# Patient Record
Sex: Female | Born: 1945 | Race: White | Hispanic: No | Marital: Married | Smoking: Former smoker
Health system: Southern US, Community
[De-identification: ages and names within clinical notes are randomized; demographics above are authoritative.]

## PROBLEM LIST (undated history)

## (undated) DIAGNOSIS — T8859XA Other complications of anesthesia, initial encounter: Secondary | ICD-10-CM

## (undated) DIAGNOSIS — C801 Malignant (primary) neoplasm, unspecified: Secondary | ICD-10-CM

## (undated) DIAGNOSIS — G709 Myoneural disorder, unspecified: Secondary | ICD-10-CM

## (undated) DIAGNOSIS — T4145XA Adverse effect of unspecified anesthetic, initial encounter: Secondary | ICD-10-CM

## (undated) DIAGNOSIS — H269 Unspecified cataract: Secondary | ICD-10-CM

## (undated) DIAGNOSIS — J45909 Unspecified asthma, uncomplicated: Secondary | ICD-10-CM

## (undated) DIAGNOSIS — R55 Syncope and collapse: Secondary | ICD-10-CM

## (undated) DIAGNOSIS — R42 Dizziness and giddiness: Secondary | ICD-10-CM

## (undated) DIAGNOSIS — S0990XA Unspecified injury of head, initial encounter: Secondary | ICD-10-CM

## (undated) DIAGNOSIS — I639 Cerebral infarction, unspecified: Secondary | ICD-10-CM

## (undated) DIAGNOSIS — IMO0001 Reserved for inherently not codable concepts without codable children: Secondary | ICD-10-CM

## (undated) DIAGNOSIS — M1990 Unspecified osteoarthritis, unspecified site: Secondary | ICD-10-CM

## (undated) DIAGNOSIS — K219 Gastro-esophageal reflux disease without esophagitis: Secondary | ICD-10-CM

## (undated) HISTORY — PX: CATARACT EXTRACTION, BILATERAL: SHX1313

## (undated) HISTORY — DX: Unspecified cataract: H26.9

## (undated) HISTORY — PX: OTHER SURGICAL HISTORY: SHX169

## (undated) HISTORY — PX: COLONOSCOPY W/ POLYPECTOMY: SHX1380

## (undated) HISTORY — PX: KNEE ARTHROSCOPY W/ MENISCAL REPAIR: SHX1877

---

## 1970-02-25 HISTORY — PX: TUBAL LIGATION: SHX77

## 1976-02-26 HISTORY — PX: BREAST ENHANCEMENT SURGERY: SHX7

## 1976-02-26 HISTORY — PX: VARICOSE VEIN SURGERY: SHX832

## 1991-02-26 HISTORY — PX: ABDOMINAL HYSTERECTOMY: SHX81

## 2001-08-24 ENCOUNTER — Ambulatory Visit (HOSPITAL_BASED_OUTPATIENT_CLINIC_OR_DEPARTMENT_OTHER): Admission: RE | Admit: 2001-08-24 | Discharge: 2001-08-24 | Payer: Self-pay | Admitting: Orthopedic Surgery

## 2004-01-13 ENCOUNTER — Ambulatory Visit (HOSPITAL_COMMUNITY): Admission: RE | Admit: 2004-01-13 | Discharge: 2004-01-13 | Payer: Self-pay | Admitting: Gastroenterology

## 2004-01-13 ENCOUNTER — Encounter (INDEPENDENT_AMBULATORY_CARE_PROVIDER_SITE_OTHER): Payer: Self-pay | Admitting: *Deleted

## 2004-07-02 ENCOUNTER — Emergency Department (HOSPITAL_COMMUNITY): Admission: EM | Admit: 2004-07-02 | Discharge: 2004-07-02 | Payer: Self-pay | Admitting: Emergency Medicine

## 2007-05-17 ENCOUNTER — Emergency Department (HOSPITAL_COMMUNITY): Admission: EM | Admit: 2007-05-17 | Discharge: 2007-05-17 | Payer: Self-pay | Admitting: Emergency Medicine

## 2007-10-19 ENCOUNTER — Encounter: Payer: Self-pay | Admitting: Family Medicine

## 2008-07-27 ENCOUNTER — Ambulatory Visit: Payer: Self-pay | Admitting: Family Medicine

## 2008-07-27 DIAGNOSIS — L255 Unspecified contact dermatitis due to plants, except food: Secondary | ICD-10-CM | POA: Insufficient documentation

## 2008-07-27 DIAGNOSIS — K219 Gastro-esophageal reflux disease without esophagitis: Secondary | ICD-10-CM | POA: Insufficient documentation

## 2008-07-27 DIAGNOSIS — F329 Major depressive disorder, single episode, unspecified: Secondary | ICD-10-CM

## 2008-07-27 DIAGNOSIS — F3289 Other specified depressive episodes: Secondary | ICD-10-CM | POA: Insufficient documentation

## 2008-10-24 ENCOUNTER — Encounter: Payer: Self-pay | Admitting: Family Medicine

## 2008-11-03 ENCOUNTER — Ambulatory Visit: Payer: Self-pay | Admitting: Family Medicine

## 2008-11-03 LAB — CONVERTED CEMR LAB
AST: 32 units/L (ref 0–37)
Albumin: 3.8 g/dL (ref 3.5–5.2)
Alkaline Phosphatase: 63 units/L (ref 39–117)
Basophils Relative: 0.5 % (ref 0.0–3.0)
Bilirubin, Direct: 0 mg/dL (ref 0.0–0.3)
Blood in Urine, dipstick: NEGATIVE
CO2: 30 meq/L (ref 19–32)
Calcium: 9.2 mg/dL (ref 8.4–10.5)
Creatinine, Ser: 0.9 mg/dL (ref 0.4–1.2)
Eosinophils Absolute: 0.1 10*3/uL (ref 0.0–0.7)
Eosinophils Relative: 1.9 % (ref 0.0–5.0)
GFR calc non Af Amer: 67.09 mL/min (ref >60)
HDL: 65.7 mg/dL (ref >39.00)
Hemoglobin: 14 g/dL (ref 12.0–15.0)
Ketones, urine, test strip: NEGATIVE
LDL Cholesterol: 110 mg/dL — ABNORMAL HIGH (ref 0–99)
Lymphocytes Relative: 30.6 % (ref 12.0–46.0)
MCHC: 33.6 g/dL (ref 30.0–36.0)
Monocytes Relative: 12.3 % — ABNORMAL HIGH (ref 3.0–12.0)
Neutro Abs: 2.3 10*3/uL (ref 1.4–7.7)
Neutrophils Relative %: 54.7 % (ref 43.0–77.0)
Nitrite: NEGATIVE
RBC: 4.57 M/uL (ref 3.87–5.11)
Sodium: 138 meq/L (ref 135–145)
Specific Gravity, Urine: 1.015
Total CHOL/HDL Ratio: 3
Total Protein: 6.6 g/dL (ref 6.0–8.3)
Triglycerides: 54 mg/dL (ref 0.0–149.0)
Urobilinogen, UA: 0.2
WBC Urine, dipstick: NEGATIVE
WBC: 4.2 10*3/uL — ABNORMAL LOW (ref 4.5–10.5)

## 2008-11-11 ENCOUNTER — Ambulatory Visit: Payer: Self-pay | Admitting: Family Medicine

## 2008-11-11 DIAGNOSIS — L719 Rosacea, unspecified: Secondary | ICD-10-CM | POA: Insufficient documentation

## 2008-11-11 DIAGNOSIS — R3915 Urgency of urination: Secondary | ICD-10-CM | POA: Insufficient documentation

## 2008-11-11 DIAGNOSIS — Z780 Asymptomatic menopausal state: Secondary | ICD-10-CM | POA: Insufficient documentation

## 2008-12-06 ENCOUNTER — Ambulatory Visit: Payer: Self-pay | Admitting: Internal Medicine

## 2008-12-06 ENCOUNTER — Encounter: Payer: Self-pay | Admitting: Family Medicine

## 2008-12-15 ENCOUNTER — Telehealth: Payer: Self-pay | Admitting: Family Medicine

## 2009-07-17 ENCOUNTER — Ambulatory Visit: Payer: Self-pay | Admitting: Family Medicine

## 2009-07-17 DIAGNOSIS — R252 Cramp and spasm: Secondary | ICD-10-CM | POA: Insufficient documentation

## 2009-10-09 ENCOUNTER — Ambulatory Visit: Payer: Self-pay | Admitting: Family Medicine

## 2009-10-09 DIAGNOSIS — R319 Hematuria, unspecified: Secondary | ICD-10-CM | POA: Insufficient documentation

## 2009-10-09 DIAGNOSIS — R609 Edema, unspecified: Secondary | ICD-10-CM | POA: Insufficient documentation

## 2009-10-09 LAB — CONVERTED CEMR LAB
Ketones, urine, test strip: NEGATIVE
Protein, U semiquant: NEGATIVE
Specific Gravity, Urine: 1.01

## 2009-10-10 ENCOUNTER — Encounter: Payer: Self-pay | Admitting: Family Medicine

## 2009-10-12 LAB — CONVERTED CEMR LAB
BUN: 16 mg/dL (ref 6–23)
CO2: 29 meq/L (ref 19–32)
Calcium: 9.4 mg/dL (ref 8.4–10.5)
Glucose, Bld: 86 mg/dL (ref 70–99)
Potassium: 5.2 meq/L — ABNORMAL HIGH (ref 3.5–5.1)
Sodium: 142 meq/L (ref 135–145)

## 2009-10-23 ENCOUNTER — Ambulatory Visit: Payer: Self-pay | Admitting: Family Medicine

## 2009-10-23 LAB — CONVERTED CEMR LAB
Bilirubin Urine: NEGATIVE
Blood in Urine, dipstick: NEGATIVE
Glucose, Urine, Semiquant: NEGATIVE
Protein, U semiquant: NEGATIVE
Urobilinogen, UA: 0.2
pH: 6

## 2009-11-07 ENCOUNTER — Ambulatory Visit: Payer: Self-pay | Admitting: Family Medicine

## 2009-11-07 DIAGNOSIS — R21 Rash and other nonspecific skin eruption: Secondary | ICD-10-CM | POA: Insufficient documentation

## 2009-11-07 LAB — CONVERTED CEMR LAB
ALT: 22 U/L
AST: 32 U/L
Albumin: 3.7 g/dL
Alkaline Phosphatase: 63 U/L
BUN: 18 mg/dL
Basophils Absolute: 0 K/uL
Basophils Relative: 1.1 %
Bilirubin Urine: NEGATIVE
Bilirubin, Direct: 0.2 mg/dL
CO2: 29 meq/L
Calcium: 8.9 mg/dL
Chloride: 104 meq/L
Cholesterol: 203 mg/dL — ABNORMAL HIGH
Creatinine, Ser: 0.8 mg/dL
Direct LDL: 133.6 mg/dL
Eosinophils Absolute: 0.1 K/uL
Eosinophils Relative: 2.6 %
GFR calc non Af Amer: 72.42 mL/min
Glucose, Bld: 78 mg/dL
HCT: 39.2 %
HDL: 61.7 mg/dL
Hemoglobin, Urine: NEGATIVE
Hemoglobin: 13.5 g/dL
Ketones, ur: NEGATIVE mg/dL
Lymphocytes Relative: 35 %
Lymphs Abs: 1.4 K/uL
MCHC: 34.5 g/dL
MCV: 88.6 fL
Monocytes Absolute: 0.5 K/uL
Monocytes Relative: 12.1 % — ABNORMAL HIGH
Neutro Abs: 2 K/uL
Neutrophils Relative %: 49.2 %
Nitrite: NEGATIVE
Platelets: 281 K/uL
Potassium: 4.6 meq/L
RBC: 4.42 M/uL
RDW: 13.7 %
Sodium: 140 meq/L
Specific Gravity, Urine: <=1.005
TSH: 1.82 u[IU]/mL
Total Bilirubin: 0.7 mg/dL
Total CHOL/HDL Ratio: 3
Total Protein, Urine: NEGATIVE mg/dL
Total Protein: 6.1 g/dL
Triglycerides: 37 mg/dL
Urine Glucose: NEGATIVE mg/dL
Urobilinogen, UA: 0.2
VLDL: 7.4 mg/dL
WBC: 4.1 10*3/microliter — ABNORMAL LOW
pH: 6

## 2009-11-14 ENCOUNTER — Ambulatory Visit: Payer: Self-pay | Admitting: Family Medicine

## 2009-11-15 ENCOUNTER — Telehealth: Payer: Self-pay | Admitting: Family Medicine

## 2009-11-21 ENCOUNTER — Encounter: Payer: Self-pay | Admitting: Family Medicine

## 2010-03-14 ENCOUNTER — Encounter: Payer: Self-pay | Admitting: Family Medicine

## 2010-03-17 ENCOUNTER — Encounter: Payer: Self-pay | Admitting: Obstetrics and Gynecology

## 2010-03-19 ENCOUNTER — Encounter: Payer: Self-pay | Admitting: Family Medicine

## 2010-03-27 NOTE — Assessment & Plan Note (Signed)
Summary: poison ivy//ccm   Vital Signs:  Patient profile:   65 year old female Menstrual status:  hysterectomy Temp:     98.6 degrees F oral BP sitting:   90 / 64  (left arm) Cuff size:   regular  Vitals Entered By: Sid Falcon LPN (Jul 17, 2009 3:56 PM) CC: Poison ivy   History of Present Illness: Patient seen with recurrent pruritic rash typical of contact dermatitis. Multiple similar rashes in the past.  Current rash left neck and lower extremities. Denies any fever or chills. Has tried over-the-counter medications without much relief. Has responded well to combination of intramuscular steroids and oral prednisone in the past. Symptoms of pruritus are severe at times.  Patient also relates that she's had diazepam 5 mg in the past for sporadic leg cramps and this seems to help. She rarely takes his medication.  Allergies (verified): 1)  * Attarax  Past History:  Past Medical History: Last updated: 11/11/2008 Arthritis in foot and knee Chicken pox Depression GERD Hay fever/allergies Urin incontinence occasionally Rosacea  Review of Systems      See HPI  Physical Exam  General:  Well-developed,well-nourished,in no acute distress; alert,appropriate and cooperative throughout examination Mouth:  Oral mucosa and oropharynx without lesions or exudates.  Teeth in good repair. Lungs:  Normal respiratory effort, chest expands symmetrically. Lungs are clear to auscultation, no crackles or wheezes. Heart:  Normal rate and regular rhythm. S1 and S2 normal without gallop, murmur, click, rub or other extra sounds. Skin:  patient has slightly raised vesicular rash which is nontender lower legs bilaterally and small patch left neck   Impression & Recommendations:  Problem # 1:  RHUS DERMATITIS (ICD-692.6)  Her updated medication list for this problem includes:    Prednisone 10 Mg Tabs (Prednisone) .Marland Kitchen... Taper as follows: 4-4-4-4-3-3-3-2-2-2-1-1  Problem # 2:  LEG CRAMPS  (ICD-729.82)  Complete Medication List: 1)  Wellbutrin Xl 300 Mg Xr24h-tab (Bupropion hcl) .... One by mouth once daily 2)  Prilosec Otc 20 Mg Tbec (Omeprazole magnesium) .... Once daily 3)  Vesicare 5 Mg Tabs (Solifenacin succinate) .... One by mouth once daily 4)  Metrogel 1 % Gel (Metronidazole) .... Apply to affected rash once daily 5)  Prednisone 10 Mg Tabs (Prednisone) .... Taper as follows: 4-4-4-4-3-3-3-2-2-2-1-1 6)  Diazepam 5 Mg Tabs (Diazepam) .... One by mouth two times a day as needed.  Patient Instructions: 1)  follow up promptly for any signs of secondary infection including increased redness, fever, or painful rash Prescriptions: DIAZEPAM 5 MG TABS (DIAZEPAM) one by mouth two times a day as needed.  #30 x 0   Entered and Authorized by:   Evelena Peat MD   Signed by:   Evelena Peat MD on 07/17/2009   Method used:   Print then Give to Patient   RxID:   [id number] PREDNISONE 10 MG TABS (PREDNISONE) taper as follows: 4-4-4-4-3-3-3-2-2-2-1-1  #33 x 0   Entered and Authorized by:   Evelena Peat MD   Signed by:   Evelena Peat MD on 07/17/2009   Method used:   [facility]ron[facility] to        [facility] [phone]* (retail)       [address]       [place], [place]  [address]       Ph: [phone] or [phone]       Fax: [phone]   RxID:   [id number]

## 2010-03-27 NOTE — Assessment & Plan Note (Signed)
Summary: CPX/CJR   Vital Signs:  Patient profile:   65 year old female Menstrual status:  hysterectomy Height:      68 inches Weight:      164 pounds BMI:     25.03 O2 Sat:      97 % Temp:     09.2 degrees F oral Pulse rate:   87 / minute Pulse rhythm:   regular Resp:     16 per minute BP sitting:   100 / 76  Vitals Entered By: Lynann Beaver CMA (November 14, 2009 10:28 AM)  Nutrition Counseling: Patient's BMI is greater than 25 and therefore counseled on weight management options. CC: cpx Is Patient Diabetic? No Pain Assessment Patient in pain? no        History of Present Illness: Here for CPE.  Needs flu vaccine. Exercising regularly Other immunizatins up to date with exception of Shingles vaccine and she is unsure if insurance will cover. Colonoscopy up to date.  No indication for pap as prior TAH.  Clinical Review Panels:  Prevention   Last Mammogram:  Normal (11/06/2009)   Last Colonoscopy:  normal (02/26/2003)  Immunizations   Last Tetanus Booster:  Historical (02/26/2004)   Last Pneumovax:  Historical (02/26/2007)  Lipid Management   Cholesterol:  203 (11/07/2009)   LDL (bad choesterol):  110 (11/03/2008)   HDL (good cholesterol):  61.70 (11/07/2009)  Diabetes Management   Creatinine:  0.8 (11/07/2009)   Last Pneumovax:  Historical (02/26/2007)  CBC   WBC:  4.1 (11/07/2009)   RBC:  4.42 (11/07/2009)   Hgb:  13.5 (11/07/2009)   Hct:  39.2 (11/07/2009)   Platelets:  281.0 (11/07/2009)   MCV  88.6 (11/07/2009)   MCHC  34.5 (11/07/2009)   RDW  13.7 (11/07/2009)   PMN:  49.2 (11/07/2009)   Lymphs:  35.0 (11/07/2009)   Monos:  12.1 (11/07/2009)   Eosinophils:  2.6 (11/07/2009)   Basophil:  1.1 (11/07/2009)  Complete Metabolic Panel   Glucose:  78 (11/07/2009)   Sodium:  140 (11/07/2009)   Potassium:  4.6 (11/07/2009)   Chloride:  104 (11/07/2009)   CO2:  29 (11/07/2009)   BUN:  18 (11/07/2009)   Creatinine:  0.8 (11/07/2009)  Albumin:  3.7 (11/07/2009)   Total Protein:  6.1 (11/07/2009)   Calcium:  8.9 (11/07/2009)   Total Bili:  0.7 (11/07/2009)   Alk Phos:  63 (11/07/2009)   SGPT (ALT):  22 (11/07/2009)   SGOT (AST):  32 (11/07/2009)   Current Medications (verified): 1)  Wellbutrin Xl 300 Mg Xr24h-Tab (Bupropion Hcl) .... One By Mouth Once Daily 2)  Prilosec Otc 20 Mg Tbec (Omeprazole Magnesium) .... Once Daily 3)  Vesicare 5 Mg Tabs (Solifenacin Succinate) .... One By Mouth Once Daily 4)  Metrogel 1 % Gel (Metronidazole) .... Apply To Affected Rash Once Daily 5)  Diazepam 5 Mg Tabs (Diazepam) .... One By Mouth Two Times A Day As Needed. 6)  Macucure .Marland Kitchen.. 2 Tabs Daily (Per Dr Elmer Picker For Eyes) 7)  Triamincilone 0.1% Cream (454gm) 1:1 Compound With Eucerin .... Use As Directed  Allergies (verified): 1)  * Attarax  Past History:  Past Medical History: Last updated: 11/11/2008 Arthritis in foot and knee Chicken pox Depression GERD Hay fever/allergies Urin incontinence occasionally Rosacea  Family History: Last updated: 11/14/2009 Family history heart disease  Mother 68 MI Family history kidney disease  Social History: Last updated: 07/27/2008 Retired Professor (PHD) Married Smoker, quit 1992  Past Surgical History: Tubal ligation 1973 vein  stripping 1978 Breast augmentation 1983 Laser surgery on veins left leg 2009 ACL right knee 2003 Hysterectomy, TAH 1994  menorrhagia  Family History: Family history heart disease  Mother 52 MI Family history kidney disease  Review of Systems  The patient denies anorexia, fever, weight loss, weight gain, vision loss, decreased hearing, hoarseness, chest pain, syncope, dyspnea on exertion, peripheral edema, prolonged cough, headaches, hemoptysis, abdominal pain, melena, hematochezia, severe indigestion/heartburn, hematuria, incontinence, genital sores, muscle weakness, suspicious skin lesions, transient blindness, difficulty walking, depression,  unusual weight change, abnormal bleeding, enlarged lymph nodes, and breast masses.    Physical Exam  General:  Well-developed,well-nourished,in no acute distress; alert,appropriate and cooperative throughout examination Head:  Normocephalic and atraumatic without obvious abnormalities. No apparent alopecia or balding. Eyes:  pupils equal, pupils round, and pupils reactive to light.   Ears:  External ear exam shows no significant lesions or deformities.  Otoscopic examination reveals clear canals, tympanic membranes are intact bilaterally without bulging, retraction, inflammation or discharge. Hearing is grossly normal bilaterally. Mouth:  Oral mucosa and oropharynx without lesions or exudates.  Teeth in good repair. Neck:  No deformities, masses, or tenderness noted. Lungs:  Normal respiratory effort, chest expands symmetrically. Lungs are clear to auscultation, no crackles or wheezes. Heart:  Normal rate and regular rhythm. S1 and S2 normal without gallop, murmur, click, rub or other extra sounds. Abdomen:  Bowel sounds positive,abdomen soft and non-tender without masses, organomegaly or hernias noted. Genitalia:  s/p TAH Msk:  No deformity or scoliosis noted of thoracic or lumbar spine.   Extremities:  No clubbing, cyanosis, edema, or deformity noted with normal full range of motion of all joints.   Neurologic:  alert & oriented X3, cranial nerves II-XII intact, strength normal in all extremities, and gait normal.   Skin:  Intact without suspicious lesions or rashes Cervical Nodes:  No lymphadenopathy noted Psych:  normally interactive, good eye contact, not anxious appearing, and not depressed appearing.     Impression & Recommendations:  Problem # 1:  ROUTINE GENERAL MEDICAL EXAM@HEALTH  CARE FACL (ICD-V70.0) cont regular exercise.  hemoccults given.  Will check on Zostavax coverage. DEXA last year normal range.  Complete Medication List: 1)  Wellbutrin Xl 300 Mg Xr24h-tab (Bupropion  hcl) .... One by mouth once daily 2)  Prilosec Otc 20 Mg Tbec (Omeprazole magnesium) .... Once daily 3)  Vesicare 5 Mg Tabs (Solifenacin succinate) .... One by mouth once daily 4)  Metrogel 1 % Gel (Metronidazole) .... Apply to affected rash once daily 5)  Diazepam 5 Mg Tabs (Diazepam) .... One by mouth two times a day as needed. 6)  Macucure  .Marland Kitchen.. 2 tabs daily (per dr Elmer Picker for eyes) 7)  Triamincilone 0.1% Cream (454gm) 1:1 Compound With Eucerin  .... Use as directed  Patient Instructions: 1)  Consider Shingles vaccine 2)  It is important that you exercise reguarly at least 20 minutes 5 times a week. If you develop chest pain, have severe difficulty breathing, or feel very tired, stop exercising immediately and seek medical attention.  3)  Please schedule a follow-up appointment in 1 year.  4)  Take calcium +vitamin D daily.  Prescriptions: VESICARE 5 MG TABS (SOLIFENACIN SUCCINATE) one by mouth once daily  #90 x 3   Entered and Authorized by:   Evelena Peat MD   Signed by:   Evelena Peat MD on 11/14/2009   Method used:   Faxed to ...       [facility] (mail-order)             ,  [place]         Ph: [phone]       Fax: [phone]   RxID:   [id number] WELLBUTRIN XL 300 MG XR24H-TAB (BUPROPION HCL) one by mouth once daily  #90 x 3   Entered and Authorized by:   Evelena Peat MD   Signed by:   Evelena Peat MD on 11/14/2009   Method used:   Faxed to ...       [facility] (mail-order)             , [place]         Ph: [phone]       Fax: [phone]   RxID:   [id number]

## 2010-03-27 NOTE — Assessment & Plan Note (Signed)
Summary: HEMATURIA // RS   Vital Signs:  Patient profile:   65 year old female Menstrual status:  hysterectomy Temp:     97.7 degrees F oral BP sitting:   120 / 84  (left arm) Cuff size:   regular  Vitals Entered By: Sid Falcon LPN (October 09, 2009 8:52 AM) CC: Hematuria, bil swollen ankles   History of Present Illness: Patient seen for the following  Over one week ago when she was on vacation in [place] she noticed some gross hematuria. No urine frequency or burning with urination. Denied fever or chills. No history of kidney stones. She noted blood on 2 occasions only. None since then. No rectal bleeding and no vaginal spotting. She was fairly certain this came from urethra.  Patient relates bilateral ankle edema past week. Possibly some mild sunburn last week preceding this. Her edema is worse late in the day. Does have some associated dyspnea the past several weeks. No orthopnea and no PND. She is not taking any new medications.  Has noted dyspnea particularly with things like going up stairs.  Allergies: 1)  * Attarax  Past History:  Past Medical History: Last updated: 11/11/2008 Arthritis in foot and knee Chicken pox Depression GERD Hay fever/allergies Urin incontinence occasionally Rosacea  Past Surgical History: Last updated: 07/27/2008 Tubal ligation 1973 vein stripping 1978 Breast augmentation 1983 Laser surgery on veins left leg 2009 ACL right knee 2003 Hysterectomy  1994  Family History: Last updated: 07/27/2008 Family history heart disease Family history kidney disease  Social History: Last updated: 07/27/2008 Retired Professor (PHD) Married Smoker, quit 1992 PMH-FH-SH reviewed for relevance  Review of Systems       The patient complains of dyspnea on exertion and peripheral edema.  The patient denies anorexia, fever, weight loss, weight gain, chest pain, syncope, prolonged cough, headaches, hemoptysis, abdominal pain, melena, and muscle  weakness.    Physical Exam  General:  Well-developed,well-nourished,in no acute distress; alert,appropriate and cooperative throughout examination Head:  Normocephalic and atraumatic without obvious abnormalities. No apparent alopecia or balding. Eyes:  No corneal or conjunctival inflammation noted. EOMI. Perrla. Funduscopic exam benign, without hemorrhages, exudates or papilledema. Vision grossly normal. Mouth:  Oral mucosa and oropharynx without lesions or exudates.  Teeth in good repair. Neck:  No deformities, masses, or tenderness noted. Lungs:  Normal respiratory effort, chest expands symmetrically. Lungs are clear to auscultation, no crackles or wheezes. Heart:  Normal rate and regular rhythm. S1 and S2 normal without gallop, murmur, click, rub or other extra sounds. Extremities:  patient has 1+ pitting edema ankles and feet bilaterally. Trace edema lower legs bilaterally Neurologic:  alert & oriented X3, cranial nerves II-XII intact, and gait normal.     Impression & Recommendations:  Problem # 1:  EDEMA (ICD-782.3) Assessment New ?related to recent sun exposure.  Does have some recent dyspnea and will evaluate labs.  No proteinuria.  Consider CXR if persists.  Low dose HCTZ for short term use.. Orders: TLB-BMP (Basic Metabolic Panel-BMET) (80048-METABOL) TLB-TSH (Thyroid Stimulating Hormone) (84443-TSH) TLB-BNP (B-Natriuretic Peptide) (83880-BNPR) Venipuncture ([address]) Specimen Handling ([address])  Her updated medication list for this problem includes:    Hydrochlorothiazide 25 Mg Tabs (Hydrochlorothiazide) ..... One half tablet once daily as needed edema  Problem # 2:  HEMATURIA UNSPECIFIED (ICD-599.70) Assessment: New urine cx and needs f/u UA 2 weeks to reassess and pt is aware.  Only trace blood noted today. Orders: T-Culture, Urine (09811-91478) UA Dipstick w/o Micro (manual) ([address]) Venipuncture (13086) Specimen Handling (  99000)  Complete Medication List: 1)   Wellbutrin Xl 300 Mg Xr24h-tab (Bupropion hcl) .... One by mouth once daily 2)  Prilosec Otc 20 Mg Tbec (Omeprazole magnesium) .... Once daily 3)  Vesicare 5 Mg Tabs (Solifenacin succinate) .... One by mouth once daily 4)  Metrogel 1 % Gel (Metronidazole) .... Apply to affected rash once daily 5)  Prednisone 10 Mg Tabs (Prednisone) .... Taper as follows: 4-4-4-4-3-3-3-2-2-2-1-1 6)  Diazepam 5 Mg Tabs (Diazepam) .... One by mouth two times a day as needed. 7)  Hydrochlorothiazide 25 Mg Tabs (Hydrochlorothiazide) .... One half tablet once daily as needed edema  Patient Instructions: 1)  Limit your Sodium(salt) .  2)  Elevated legs/feet frequently. 3)  Repeat UA in 2 weeks. Prescriptions: HYDROCHLOROTHIAZIDE 25 MG TABS (HYDROCHLOROTHIAZIDE) one half tablet once daily as needed edema  #30 x 0   Entered and Authorized by:   Evelena Peat MD   Signed by:   Evelena Peat MD on 10/09/2009   Method used:   [facility] to        [facility] [phone][facility] (retail)       [address]       [place], [place]  [address]       Ph: [phone] or [phone]       Fax: [phone]   RxID:   [id number]   Laboratory Results   Urine Tests    Routine Urinalysis   Color: yellow Appearance: Clear Glucose: negative   (Normal Range: Negative) Bilirubin: negative   (Normal Range: Negative) Ketone: negative   (Normal Range: Negative) Spec. Gravity: 1.010   (Normal Range: 1.003-1.035) Blood: trace-lysed   (Normal Range: Negative) pH: 6.0   (Normal Range: 5.0-8.0) Protein: negative   (Normal Range: Negative) Urobilinogen: 0.2   (Normal Range: 0-1) Nitrite: negative   (Normal Range: Negative) Leukocyte Esterace: moderate   (Normal Range: Negative)    Comments: Sid Falcon LPN  October 09, 2009 9:01 AM

## 2010-03-27 NOTE — Assessment & Plan Note (Signed)
Summary: 2 week follow up/cjr   Vital Signs:  Patient profile:   65 year old female Menstrual status:  hysterectomy Temp:     97.8 degrees F oral BP sitting:   132 / 88  (left arm) Cuff size:   regular  Vitals Entered By: Sid Falcon LPN (October 23, 2009 11:12 AM)  Serial Vital Signs/Assessments:  Time      Position  BP       Pulse  Resp  Temp     By                     118/80                         Evelena Peat MD    History of Present Illness: Here for follow up recent hematuria. Urine cx was negative. No further episodes.  No dysuria. Has hx urge urine incontinence and Vesicare helps.  Edema improved since last visit.  Labs OK.   Only took one dose of HCTZ.  Allergies: 1)  * Attarax  Past History:  Past Medical History: Last updated: 11/11/2008 Arthritis in foot and knee Chicken pox Depression GERD Hay fever/allergies Urin incontinence occasionally Rosacea  Review of Systems  The patient denies anorexia, fever, weight loss, abdominal pain, and incontinence.    Physical Exam  General:  Well-developed,well-nourished,in no acute distress; alert,appropriate and cooperative throughout examination Eyes:  No corneal or conjunctival inflammation noted. EOMI. Perrla. Funduscopic exam benign, without hemorrhages, exudates or papilledema. Vision grossly normal. Mouth:  Oral mucosa and oropharynx without lesions or exudates.  Teeth in good repair. Neck:  No deformities, masses, or tenderness noted. Lungs:  Normal respiratory effort, chest expands symmetrically. Lungs are clear to auscultation, no crackles or wheezes. Heart:  normal rate and regular rhythm.   Extremities:  no edema today.   Impression & Recommendations:  Problem # 1:  EDEMA (ICD-782.3) Assessment Improved  Her updated medication list for this problem includes:    Hydrochlorothiazide 25 Mg Tabs (Hydrochlorothiazide) ..... One half tablet once daily as needed edema  Problem # 2:  HEMATURIA  UNSPECIFIED (ICD-599.70)  urine dip today normal-no hematuria.  Prompt f/u if recurrent episodes.  Orders: UA Dipstick w/o Micro (manual) ([address])  Complete Medication List: 1)  Wellbutrin Xl 300 Mg Xr24h-tab (Bupropion hcl) .... One by mouth once daily 2)  Prilosec Otc 20 Mg Tbec (Omeprazole magnesium) .... Once daily 3)  Vesicare 5 Mg Tabs (Solifenacin succinate) .... One by mouth once daily 4)  Metrogel 1 % Gel (Metronidazole) .... Apply to affected rash once daily 5)  Prednisone 10 Mg Tabs (Prednisone) .... Taper as follows: 4-4-4-4-3-3-3-2-2-2-1-1 6)  Diazepam 5 Mg Tabs (Diazepam) .... One by mouth two times a day as needed. 7)  Hydrochlorothiazide 25 Mg Tabs (Hydrochlorothiazide) .... One half tablet once daily as needed edema  Laboratory Results   Urine Tests    Routine Urinalysis   Color: lt. yellow Appearance: Clear Glucose: negative   (Normal Range: Negative) Bilirubin: negative   (Normal Range: Negative) Ketone: negative   (Normal Range: Negative) Spec. Gravity: <1.005   (Normal Range: 1.003-1.035) Blood: negative   (Normal Range: Negative) pH: 6.0   (Normal Range: 5.0-8.0) Protein: negative   (Normal Range: Negative) Urobilinogen: 0.2   (Normal Range: 0-1) Nitrite: negative   (Normal Range: Negative) Leukocyte Esterace: negative   (Normal Range: Negative)    Comments: Sid Falcon LPN  October 23, 2009 11:29 AM

## 2010-03-27 NOTE — Progress Notes (Signed)
Summary: Pt to call ins. [id number]   Phone Note Outgoing Call   Call placed by: Sid Falcon LPN,  November 15, 2009 4:49 PM Call placed to: Patient Summary of Call: Per Tim Lair, pt needs to call her insurance co for Zostavax coverage.  Message left on work VM to call her insurance and call for nurse inj visit if she wants the vaccine Initial call taken by: Sid Falcon LPN,  November 15, 2009 4:50 PM

## 2010-03-27 NOTE — Letter (Signed)
Summary: [facility]  [facility]   Imported By: Maryln Gottron 12/19/2009 09:59:44  _____________________________________________________________________  External Attachment:    Type:   Image     Comment:   External Document

## 2010-03-27 NOTE — Assessment & Plan Note (Signed)
Summary: rash all over body//ccm   Vital Signs:  Patient profile:   65 year old female Menstrual status:  hysterectomy Temp:     98.2 degrees F oral BP sitting:   110 / 80  (left arm) Cuff size:   regular  Vitals Entered By: Sid Falcon LPN (November 07, 2009 11:21 AM)  History of Present Illness: Here with a couple of rashes as follows:  L ring finger dry scaly rash for several months.  Used triamcinolone cream for 2-3 weeks without improvement.  Slightly pruritic.  She thought reaction to gold wedding band but did not improve after leaving off.  No other gold reaction (eg to gold in other areas of body).  Several week hx of pruritic rash on trunk, mostly back. Has not tried any topical treatments.  Uses Lever 2000 soap. No new meds.  No fever.  Allergies: 1)  * Attarax  Past History:  Past Medical History: Last updated: 11/11/2008 Arthritis in foot and knee Chicken pox Depression GERD Hay fever/allergies Urin incontinence occasionally Rosacea  Review of Systems      See HPI  Physical Exam  General:  Well-developed,well-nourished,in no acute distress; alert,appropriate and cooperative throughout examination Lungs:  Normal respiratory effort, chest expands symmetrically. Lungs are clear to auscultation, no crackles or wheezes. Heart:  normal rate and regular rhythm.   Skin:  L ring finger near base scaly, dry rash without pustules or vesicles.  Trunk on back she has macular erythematous rash with slightly scaly surface in irreg distribution.    Impression & Recommendations:  Problem # 1:  SKIN RASH (ICD-782.1) Assessment New 1)  ring finger- ddx contact derm vs fungal.  no response to topical steroid.  She will try antifungal-Lamisil. 2) trunk rash-?pityriasis.  Triamcinolone compound with Eucerin 1:1 and reassess at CPE next week.  If pityriasis, she is aware this may last for some time.  Complete Medication List: 1)  Wellbutrin Xl 300 Mg Xr24h-tab  (Bupropion hcl) .... One by mouth once daily 2)  Prilosec Otc 20 Mg Tbec (Omeprazole magnesium) .... Once daily 3)  Vesicare 5 Mg Tabs (Solifenacin succinate) .... One by mouth once daily 4)  Metrogel 1 % Gel (Metronidazole) .... Apply to affected rash once daily 5)  Prednisone 10 Mg Tabs (Prednisone) .... Taper as follows: 4-4-4-4-3-3-3-2-2-2-1-1 6)  Diazepam 5 Mg Tabs (Diazepam) .... One by mouth two times a day as needed. 7)  Hydrochlorothiazide 25 Mg Tabs (Hydrochlorothiazide) .... One half tablet once daily as needed edema 8)  Macucure  .Marland Kitchen.. 2 tabs daily (per dr Elmer Picker for eyes) 9)  Triamincilone 0.1% Cream (454gm) 1:1 Compound With Eucerin  .... Use as directed  Patient Instructions: 1)  Use medicated cream twice daily. 2)  Be in touch if symptoms persist or worsen. Prescriptions: TRIAMINCILONE 0.1% CREAM (454GM) 1:1 COMPOUND WITH EUCERIN use as directed  #1 x 0   Entered by:   Sid Falcon LPN   Authorized by:   Evelena Peat MD   Signed by:   Sid Falcon LPN on 04/54/0981   Method used:   Telephoned to ...       [facility] [address] (retail)       [address]       [place], [place]  [address]       Ph: [phone] or [phone]       Fax: [phone]   RxID:   [id number]

## 2010-04-04 ENCOUNTER — Other Ambulatory Visit: Payer: Self-pay | Admitting: Dermatology

## 2010-04-04 NOTE — Consult Note (Signed)
Summary: [facility]  [facility]   Imported By: Maryln Gottron 03/30/2010 13:40:26  _____________________________________________________________________  External Attachment:    Type:   Image     Comment:   External Document

## 2010-04-23 ENCOUNTER — Other Ambulatory Visit: Payer: Self-pay | Admitting: Dermatology

## 2010-07-12 ENCOUNTER — Other Ambulatory Visit: Payer: Self-pay | Admitting: Dermatology

## 2010-07-13 NOTE — Op Note (Signed)
[name], Mia Peterson               ACCOUNT NO.:  [id number]   MEDICAL RECORD NO.:  [id number]          PATIENT TYPE:  AMB   LOCATION:  ENDO                         FACILITY:  [facility]   PHYSICIAN:  Anselmo Rod, M.D.  DATE OF BIRTH:  14-Nov-1945   DATE OF PROCEDURE:  01/16/2004  DATE OF DISCHARGE:  01/13/2004                                 OPERATIVE REPORT   PROCEDURE PERFORMED:  Colonoscopy with cold biopsies times three.   ENDOSCOPIST:  Charna Elizabeth, M.D.   INSTRUMENT USED:  Olympus video colonoscope.   INDICATIONS FOR PROCEDURE:  The patient is a 65 year old white female  undergoing screening colonoscopy.  The patient had an episode of rectal  bleeding on one occasion in the recent past.  Rule out colonic polyps,  masses, hemorrhoids, etc.   PREPROCEDURE PREPARATION:  Informed consent was procured from the patient.  The patient was fasted for eight hours prior to the procedure and prepped  with a bottle of magnesium citrate and a gallon of GoLYTELY the night prior  to the procedure.  10% miss rates of polyps, cancers were discussed with the  patient.  The risks and benefits of the procedure including bleeding,  perforation, etc were discussed with her as well.   PREPROCEDURE PHYSICAL:  The patient had stable vital signs.  Neck supple.  Chest clear to auscultation.  S1 and S2 regular.  Abdomen soft with normal  bowel sounds.   DESCRIPTION OF PROCEDURE:  The patient was placed in left lateral decubitus  position and sedated with 100 mg of Demerol and 7 mg of Versed in slow  incremental doses.  Once the patient was adequately sedated and maintained  on low flow oxygen and continuous cardiac monitoring, the Olympus video  colonoscope was advanced from the rectum to the cecum.  The appendicular  orifice and ileocecal valve were clearly visualized and photographed.  Small  internal hemorrhoids were seen on retroflexion in the rectum and two small  sessile polyps were biopsied at 60  cm.  There was no evidence of  diverticulosis.  The patient tolerated the procedure well without immediate  complications.   IMPRESSION:  1.  Small nonbleeding internal hemorrhoids.  2.  Two small sessile polyps biopsied (cold biopsies times three) at 60 cm.  3.  No evidence of diverticulosis.   RECOMMENDATIONS:  1.  Await pathology results.  2.  Avoid all nonsteroidals including aspirin for the next two weeks.  3.  Outpatient followup as need arises in the future.  4.  Repeat colorectal cancer screening depending on pathology results.  5.  Anusol suppositories have [facility] for symptomatic relief from her hemorrhoids.       JNM/MEDQ  D:  01/16/2004  T:  01/16/2004  Job:  [id number]   cc:   Evelena Peat, M.D.  [address]  [place]  [place] [address]  Fax: [phone]

## 2010-07-13 NOTE — Op Note (Signed)
[facility]  Patient:    Mia Peterson, Mia Peterson Visit Number: [id number] MRN: [id number]          Service Type: DSU Location: [facility] Attending Physician:  Milly Jakob Dictated by:   Harvie Junior, M.D. Proc. Date: 08/24/01 Admit Date:  08/24/2001                             Operative Report  PREOPERATIVE DIAGNOSIS:  Lateral meniscal tear with degeneration lateral femoral compartment.  POSTOPERATIVE DIAGNOSES: 1. Lateral meniscal tear with degeneration lateral femoral compartment. 2. 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  PROCEDURES PERFORMED: 1. Debridement of posterior horn medial meniscectomy with corresponding    chondroplasty of the lateral tibial plateau and lateral femoral condyle. 2. Chondroplasty medial femoral condyle. 3. Medial shelf plica resection.  SURGEON:  Harvie Junior, M.D.  ASSISTANT SURGEONS:  Currie Paris. Thedore Mins. and Floyde Parkins, P.A. student.  ANESTHESIA:  Knee block with MAC.  BRIEF HISTORY:  The patient is a 65 year old female with a long history of having catching and locking in the knee on the lateral side in particular. Ultimately she was evaluated and felt to have a meniscal tear. She tried conservative treatment and failed. 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 Because of the failure of conservative care, the patient was ultimately taken to the operating room for operative knee arthroscopy.  DESCRIPTION OF THE PROCEDURE:  The patient was taken to the operating room and after adequate anesthesia with a MAC and a knee block place, the patient was placed in the supine position and the table was turned to the right. The leg was prepped and draped in the usual sterile fashion.  Following this routine arthroscopic examination of the knee revealed there was an obvious  posterior tear of the lateral meniscus, as was determined by MRI. This was debrided with the straight running forceps back to a smooth and stable rim. There was corresponding grade 3 changes in the lateral tibial plateau on her femoral condyle. These were debrided with the sucker shaver back to a smooth and stable rim.  Attention was turned to the medial femoral compartment where there was noted to be some grade 2 changes in the medial femoral condyle. This was debrided with a chondroplasty. Attention at that time was turned to the patellofemoral joint where there was noted to be some relatively midline tracking and no significant chondromalacia. There was a medial shelf plica which was resected.  At this point the knee was copiously irrigated and suctioned dry. A final check was made for any loose fragment pieces prior to that and there were none. A sterile compressive dressing was applied and the patient was taken to the recovery room and noted to be in satisfactory condition. Dictated by:   Harvie Junior, M.D. Attending Physician:  Milly Jakob DD:  08/24/01 TD:  08/25/01 Job: [id number]

## 2010-08-16 ENCOUNTER — Other Ambulatory Visit: Payer: Self-pay | Admitting: Dermatology

## 2010-09-07 ENCOUNTER — Other Ambulatory Visit: Payer: Self-pay | Admitting: Dermatology

## 2010-10-09 ENCOUNTER — Encounter: Payer: Self-pay | Admitting: Family Medicine

## 2010-11-12 ENCOUNTER — Other Ambulatory Visit: Payer: Self-pay | Admitting: Family Medicine

## 2010-11-19 LAB — URINALYSIS, ROUTINE W REFLEX MICROSCOPIC
Bilirubin Urine: NEGATIVE
Glucose, UA: NEGATIVE
Hgb urine dipstick: NEGATIVE
Specific Gravity, Urine: 1.002 — ABNORMAL LOW
pH: 6.5

## 2010-11-19 LAB — COMPREHENSIVE METABOLIC PANEL
ALT: 25
CO2: 25
Calcium: 7.8 — ABNORMAL LOW
Creatinine, Ser: 0.89
GFR calc non Af Amer: >60
Glucose, Bld: 96
Sodium: 136

## 2010-11-19 LAB — CBC
HCT: 40.2
Hemoglobin: 13.8
MCHC: 34.4
MCV: 85.7
RBC: 4.69

## 2010-11-19 LAB — DIFFERENTIAL
Eosinophils Absolute: 0
Eosinophils Relative: 0
Monocytes Absolute: 0.3
Neutrophils Relative %: 63

## 2010-11-23 ENCOUNTER — Encounter: Payer: Self-pay | Admitting: Family Medicine

## 2010-11-23 ENCOUNTER — Ambulatory Visit (INDEPENDENT_AMBULATORY_CARE_PROVIDER_SITE_OTHER): Payer: Medicare Other | Admitting: Family Medicine

## 2010-11-23 VITALS — BP 120/90 | HR 72 | Temp 97.6°F | Resp 12 | Ht 68.0 in | Wt 164.0 lb

## 2010-11-23 DIAGNOSIS — M25512 Pain in left shoulder: Secondary | ICD-10-CM

## 2010-11-23 DIAGNOSIS — Z0000 Encounter for general adult medical examination without abnormal findings: Secondary | ICD-10-CM

## 2010-11-23 DIAGNOSIS — Z23 Encounter for immunization: Secondary | ICD-10-CM

## 2010-11-23 DIAGNOSIS — H6120 Impacted cerumen, unspecified ear: Secondary | ICD-10-CM

## 2010-11-23 DIAGNOSIS — C4492 Squamous cell carcinoma of skin, unspecified: Secondary | ICD-10-CM | POA: Insufficient documentation

## 2010-11-23 DIAGNOSIS — R3915 Urgency of urination: Secondary | ICD-10-CM

## 2010-11-23 DIAGNOSIS — M25519 Pain in unspecified shoulder: Secondary | ICD-10-CM

## 2010-11-23 DIAGNOSIS — C4491 Basal cell carcinoma of skin, unspecified: Secondary | ICD-10-CM | POA: Insufficient documentation

## 2010-11-23 LAB — BASIC METABOLIC PANEL
CO2: 28 mEq/L (ref 19–32)
Calcium: 9.3 mg/dL (ref 8.4–10.5)
GFR: 71.2 mL/min (ref >60.00)
Sodium: 140 mEq/L (ref 135–145)

## 2010-11-23 LAB — CBC WITH DIFFERENTIAL/PLATELET
Basophils Absolute: 0 10*3/uL (ref 0.0–0.1)
Eosinophils Absolute: 0.2 10*3/uL (ref 0.0–0.7)
HCT: 40 % (ref 36.0–46.0)
Lymphs Abs: 1.7 10*3/uL (ref 0.7–4.0)
MCHC: 33.1 g/dL (ref 30.0–36.0)
MCV: 91.2 fl (ref 78.0–100.0)
Monocytes Absolute: 0.7 10*3/uL (ref 0.1–1.0)
Neutrophils Relative %: 65.4 % (ref 43.0–77.0)
Platelets: 307 10*3/uL (ref 150.0–400.0)
RDW: 14.1 % (ref 11.5–14.6)
WBC: 7.3 10*3/uL (ref 4.5–10.5)

## 2010-11-23 LAB — LIPID PANEL
Cholesterol: 218 mg/dL — ABNORMAL HIGH (ref 0–200)
HDL: 77.8 mg/dL (ref >39.00)
Triglycerides: 28 mg/dL (ref 0.0–149.0)

## 2010-11-23 LAB — HEPATIC FUNCTION PANEL
Albumin: 4 g/dL (ref 3.5–5.2)
Total Bilirubin: 0.6 mg/dL (ref 0.3–1.2)

## 2010-11-23 LAB — TSH: TSH: 1.69 u[IU]/mL (ref 0.35–5.50)

## 2010-11-23 NOTE — Progress Notes (Signed)
Subjective:    [name], female    DOB: Sep 28, 1945, 65 y.o.   MRN: [id number]  HPI Patient here for Medicare wellness exam and medical followup. She has seen dermatologist for the past year for excision of both basal cell and squamous cell skin cancers. Also had actinic keratosis involving back. Tetanus 2006. Pneumovax 2009. No history of shingles vaccine. DEXA scan 2 years ago. Mammogram earlier this month. Colonoscopy 2005.  Right ear fullness. History of cerumen impactions previously. Right shoulder pain intermittently for several months. No injury. Pain worse with internal rotation and abduction. No weakness. Recently night pain. No alleviating factors. Ices intermittently. No neck pain  Some urine urgency mostly at night. Vesicare helps overall with symptoms but she is taking in the morning.  No adverse side effects.  No past medical history on file. Past Surgical History  Procedure Date  . Breast enhancement surgery 1978  . Tubal ligation 1972  . Varicose vein surgery 1978  . Abdominal hysterectomy 1993    menorrhagia.    reports that she quit smoking about 20 years ago. Her smoking use included Cigarettes. She has a 20 pack-year smoking history. She does not have any smokeless tobacco history on file. Her alcohol and drug histories not on file. family history includes Cancer (age of onset:90) in her father and Heart disease (age of onset:63) in her mother. No Known Allergies  1.  Risk factors based on Past Medical , Social, and Family history  history as above and reviewed. 2.  Limitations in physical activities very active physically. Low risk for falls 3.  Depression/mood no depression or anxiety issues 4.  Hearing she's had some chronic hearing loss. Recently has seen audiologist. Prescription for hearing aids but not using 5.  ADLs fully independent in all 6.  Cognitive function (orientation to time and place, language, writing, speech,memory) no limitations  with short or long-term memory. Judgment intact. Language intact 7.  Home Safety no issues identified 8.  Height, weight, and visual acuity. Height and weight stable. No visual changes 9.  Counseling 10. Recommendation of preventive services. Flu vaccine given. Recommend shingles vaccine. Continue yearly mammogram. Hemoccult cards and repeat colonoscopy in a couple years. Repeat DEXA scan next year with mammogram 11. Labs based on risk factors TSH, lipid, hepatic, CBC, and basic metabolic panel 12. Care Plan as above 13.  Advance Directives-discussed.     Review of Systems  Constitutional: Negative for fever, activity change, appetite change, fatigue and unexpected weight change.  HENT: Negative for hearing loss, ear pain, sore throat and trouble swallowing.   Eyes: Negative for visual disturbance.  Respiratory: Negative for cough and shortness of breath.   Cardiovascular: Negative for chest pain and palpitations.  Gastrointestinal: Negative for abdominal pain, diarrhea, constipation and blood in stool.  Genitourinary: Negative for dysuria and hematuria.  Musculoskeletal: Negative for myalgias, back pain and arthralgias.  Skin: Negative for rash.  Neurological: Negative for dizziness, syncope and headaches.  Hematological: Negative for adenopathy.  Psychiatric/Behavioral: Negative for confusion and dysphoric mood.       Objective:   Physical Exam  Constitutional: She is oriented to person, place, and time. She appears well-developed and well-nourished.  HENT:  Head: Normocephalic and atraumatic.       Cerumen impaction right canal  Eyes: EOM are normal. Pupils are equal, round, and reactive to light.  Neck: Normal range of motion. Neck supple. No thyromegaly present.  Cardiovascular: Normal rate, regular rhythm and normal heart  sounds.   No murmur heard. Pulmonary/Chest: Breath sounds normal. No respiratory distress. She has no wheezes. She has no rales.  Abdominal: Soft. Bowel  sounds are normal. She exhibits no distension and no mass. There is no tenderness. There is no rebound and no guarding.  Genitourinary:       Breasts are symmetric with no masses Pap smear not done as patient has had hysterectomy for benign disease  Musculoskeletal: Normal range of motion. She exhibits no edema.       Right shoulder reveals excellent range of motion. Pain with abduction against resistance. No biceps tenderness. No weakness  Lymphadenopathy:    She has no cervical adenopathy.  Neurological: She is alert and oriented to person, place, and time. She displays normal reflexes. No cranial nerve deficit.  Skin: No rash noted.  Psychiatric: She has a normal mood and affect. Her behavior is normal. Judgment and thought content normal.          Assessment & Plan:  #1 health maintenance. Patient requests shingles vaccine. No contraindications. Flu vaccine given. Pneumovax up-to-date. Hemoccults given. Lab work ordered.  Repeat DEXA next year-normal in past. #2 cerumen impaction right canal. Removed with irrigation #3 urinary urgency. Frequent nighttime symptoms. Currently takes VESIcare in the morning. Shift to night time use #4 right shoulder pain. Suspect rotator cuff tendinitis. No evidence for adhesive capsulitis. Continue icing and range of motion. Consider corticosteroid injection if progresses

## 2010-11-23 NOTE — Patient Instructions (Signed)
Your last colonoscopy was apparently 2005. Consider completing Hemoccult cards Continue regular calcium and vitamin D supplementation Consider DEXA scan next year with mammogram

## 2010-11-24 ENCOUNTER — Encounter: Payer: Self-pay | Admitting: Family Medicine

## 2010-11-24 ENCOUNTER — Other Ambulatory Visit: Payer: Self-pay | Admitting: Family Medicine

## 2010-11-26 LAB — LDL CHOLESTEROL, DIRECT: Direct LDL: 133.2 mg/dL

## 2010-11-27 NOTE — Progress Notes (Signed)
Quick Note:  Pt informed ______ 

## 2010-12-18 ENCOUNTER — Ambulatory Visit (INDEPENDENT_AMBULATORY_CARE_PROVIDER_SITE_OTHER): Payer: Medicare Other | Admitting: Family Medicine

## 2010-12-18 ENCOUNTER — Encounter: Payer: Self-pay | Admitting: Family Medicine

## 2010-12-18 VITALS — BP 130/80 | Temp 98.2°F | Wt 172.0 lb

## 2010-12-18 DIAGNOSIS — M79671 Pain in right foot: Secondary | ICD-10-CM

## 2010-12-18 DIAGNOSIS — M79609 Pain in unspecified limb: Secondary | ICD-10-CM

## 2010-12-18 NOTE — Patient Instructions (Signed)
Ice foot 2-3 times daily Continue with anti-inflammatory. Touch base in 2-3 weeks if no better any sooner if any swelling or worsening pain.

## 2010-12-18 NOTE — Progress Notes (Signed)
  Subjective:    [name], female    DOB: 09-May-1945, 65 y.o.   MRN: [id number]  HPI  Right foot pain. Duration 6 days. No injury. Somewhat poorly localized. Dorsum of foot mostly over the fourth and fifth metatarsals. Pain with weightbearing but not at rest. No warmth, erythema, ecchymosis. She's tried some icing but only a couple of occasions as well as naproxen and aspirin with mild relief. No recent change of shoes. Generally swims and cycles for exercise. No excessive walking.  Review of Systems  Constitutional: Negative for fever and chills.  Musculoskeletal: Negative for back pain and joint swelling.  Skin: Negative for rash.       Objective:   Physical Exam  Constitutional: She appears well-developed and well-nourished.  Cardiovascular: Normal rate and regular rhythm.   Pulmonary/Chest: Effort normal and breath sounds normal. No respiratory distress. She has no wheezes. She has no rales.  Musculoskeletal: She exhibits no edema.       Right foot her nose no edema. No erythema. No ecchymosis. Minimally tender diffusely along the midshaft and distal fourth and fifth metatarsals. No ankle tenderness. Good distal foot pulses.          Assessment & Plan:  Right foot pain. Question tendinitis. Doubt stress fracture as she has no localized bony tenderness. Increase icing to 2-3 times daily. Continue naproxen. Good supportive shoe wear. Consider x-rays foot if no better in 2 weeks.

## 2011-02-07 ENCOUNTER — Other Ambulatory Visit: Payer: Self-pay | Admitting: Dermatology

## 2011-02-28 ENCOUNTER — Other Ambulatory Visit: Payer: Self-pay | Admitting: Dermatology

## 2011-02-28 DIAGNOSIS — C44721 Squamous cell carcinoma of skin of unspecified lower limb, including hip: Secondary | ICD-10-CM | POA: Diagnosis not present

## 2011-04-25 DIAGNOSIS — H04129 Dry eye syndrome of unspecified lacrimal gland: Secondary | ICD-10-CM | POA: Diagnosis not present

## 2011-04-25 DIAGNOSIS — L719 Rosacea, unspecified: Secondary | ICD-10-CM | POA: Diagnosis not present

## 2011-04-25 DIAGNOSIS — H01009 Unspecified blepharitis unspecified eye, unspecified eyelid: Secondary | ICD-10-CM | POA: Diagnosis not present

## 2011-04-26 ENCOUNTER — Encounter: Payer: Self-pay | Admitting: Family Medicine

## 2011-04-26 ENCOUNTER — Ambulatory Visit (INDEPENDENT_AMBULATORY_CARE_PROVIDER_SITE_OTHER): Payer: Medicare Other | Admitting: Family Medicine

## 2011-04-26 VITALS — BP 138/88 | Temp 98.4°F | Wt 170.0 lb

## 2011-04-26 DIAGNOSIS — B9789 Other viral agents as the cause of diseases classified elsewhere: Secondary | ICD-10-CM | POA: Diagnosis not present

## 2011-04-26 DIAGNOSIS — R5381 Other malaise: Secondary | ICD-10-CM

## 2011-04-26 DIAGNOSIS — R5383 Other fatigue: Secondary | ICD-10-CM

## 2011-04-26 DIAGNOSIS — B349 Viral infection, unspecified: Secondary | ICD-10-CM

## 2011-04-26 LAB — CBC WITH DIFFERENTIAL/PLATELET
Basophils Relative: 0.6 % (ref 0.0–3.0)
Eosinophils Absolute: 0.2 10*3/uL (ref 0.0–0.7)
Eosinophils Relative: 3.2 % (ref 0.0–5.0)
Hemoglobin: 13.2 g/dL (ref 12.0–15.0)
Lymphocytes Relative: 21.9 % (ref 12.0–46.0)
MCHC: 33.3 g/dL (ref 30.0–36.0)
MCV: 90 fl (ref 78.0–100.0)
Neutro Abs: 5 10*3/uL (ref 1.4–7.7)
RBC: 4.39 Mil/uL (ref 3.87–5.11)
WBC: 7.7 10*3/uL (ref 4.5–10.5)

## 2011-04-26 LAB — POCT URINALYSIS DIPSTICK
Blood, UA: NEGATIVE
Glucose, UA: NEGATIVE
Ketones, UA: NEGATIVE
Spec Grav, UA: 1.01
Urobilinogen, UA: 0.2

## 2011-04-26 NOTE — Progress Notes (Signed)
  Subjective:    [name], female    DOB: Sep 07, 1945, 66 y.o.   MRN: [id number]  HPI  Patient seen with an eight-day history of excessive fatigue. She started with viral type symptoms of nasal congestion, sore throat, cough and body aches. Most of the symptoms have persisted but she slept significantly over the past several days and still feels extremely draggy. She denies any headaches, skin rash, abdominal pain, productive cough, dyspnea, chest pain, dysuria, stool changes, nausea, or vomiting. Her husband had similar illness but has recovered at this point.  She has history of recurrent depression which she feels is stable currently on Wellbutrin  No past medical history on file. Past Surgical History  Procedure Date  . Breast enhancement surgery 1978  . Tubal ligation 1972  . Varicose vein surgery 1978  . Abdominal hysterectomy 1993    menorrhagia.    reports that she quit smoking about 21 years ago. Her smoking use included Cigarettes. She has a 20 pack-year smoking history. She does not have any smokeless tobacco history on file. Her alcohol and drug histories not on file. family history includes Cancer (age of onset:90) in her father and Heart disease (age of onset:63) in her mother. No Known Allergies    Review of Systems As per history of present illness    Objective:   Physical Exam  Constitutional: She appears well-developed and well-nourished.  HENT:  Right Ear: External ear normal.  Left Ear: External ear normal.  Mouth/Throat: Oropharynx is clear and moist.  Neck: Neck supple. No thyromegaly present.  Cardiovascular: Normal rate and regular rhythm.   Pulmonary/Chest: Effort normal and breath sounds normal. No respiratory distress. She has no wheezes. She has no rales.  Abdominal: Soft. Bowel sounds are normal. She exhibits no distension. There is no tenderness. There is no rebound and no guarding.  Lymphadenopathy:    She has no cervical  adenopathy.  Skin: No rash noted.          Assessment & Plan:  Fatigue and malaise probably related to recent viral illness. Given duration of symptoms check CBC and UA. She does not have any persistent myalgias to suggest polymyalgia rheumatica

## 2011-04-26 NOTE — Patient Instructions (Signed)
Follow up immediately for any fever or worsening symptoms. 

## 2011-04-29 NOTE — Progress Notes (Signed)
Quick Note:  Pt aware ______ 

## 2011-05-06 DIAGNOSIS — I8310 Varicose veins of unspecified lower extremity with inflammation: Secondary | ICD-10-CM | POA: Diagnosis not present

## 2011-05-30 DIAGNOSIS — I8310 Varicose veins of unspecified lower extremity with inflammation: Secondary | ICD-10-CM | POA: Diagnosis not present

## 2011-07-17 ENCOUNTER — Other Ambulatory Visit: Payer: Self-pay | Admitting: Family Medicine

## 2011-07-18 ENCOUNTER — Other Ambulatory Visit: Payer: Self-pay | Admitting: *Deleted

## 2011-08-27 ENCOUNTER — Telehealth: Payer: Self-pay | Admitting: Family Medicine

## 2011-08-27 ENCOUNTER — Ambulatory Visit (INDEPENDENT_AMBULATORY_CARE_PROVIDER_SITE_OTHER): Payer: Medicare Other | Admitting: Family Medicine

## 2011-08-27 DIAGNOSIS — M545 Low back pain, unspecified: Secondary | ICD-10-CM | POA: Diagnosis not present

## 2011-08-27 MED ORDER — CYCLOBENZAPRINE HCL 5 MG PO TABS: 5.0000 mg | ORAL_TABLET | Freq: Three times a day (TID) | ORAL | Status: DC | PRN

## 2011-08-27 MED ORDER — PREDNISONE 10 MG PO TABS: ORAL_TABLET | ORAL | Status: DC

## 2011-08-27 MED ORDER — HYDROCODONE-ACETAMINOPHEN 5-325 MG PO TABS: ORAL_TABLET | ORAL | Status: DC

## 2011-08-27 NOTE — Telephone Encounter (Signed)
Pt called and said [facility] not been called [facility]. Pls call in cyclobenzaprine (FLEXERIL) 5 MG tablet , HYDROcodone-acetaminophen (NORCO) 5-325 MG per tablet , predniSONE (DELTASONE) 10 MG tablet  [facility]. Pt leaving to go out of town.

## 2011-08-27 NOTE — Progress Notes (Signed)
  Subjective:    [name], female    DOB: 1945-11-23, 66 y.o.   MRN: [id number]  HPI  Acute visit. Low back pain. Mostly left buttock and lower lumbar area. Woke up with pain 08/23/2011. No injury. She describes sharp pain sometimes achy but no radiculopathy symptoms. No weakness or numbness. No urine incontinence. Pain is worse with changing positions. 7-8/10 severity at worse. She's tried muscle relaxer left over from several years ago, ibuprofen, muscle massage, heat and ice without much improvement.   Review of Systems  Constitutional: Negative for fever, chills, appetite change and unexpected weight change.  Genitourinary: Negative for dysuria.  Musculoskeletal: Positive for back pain.  Neurological: Negative for weakness and numbness.       Objective:   Physical Exam  Constitutional: She appears well-developed and well-nourished.  Cardiovascular: Normal rate and regular rhythm.   Musculoskeletal: She exhibits no edema.       Straight leg raise negative. No lower extremity edema. Full range of motion left hip. Minimal left lower lumbar tenderness. No spinal tenderness.  Neurological:        Symmetric reflexes in knee and ankle bilaterally. Full-strength left lower extremity. Normal sensory function.          Assessment & Plan:  Left lumbar back pain. Continue heat or ice for symptomatic relief. Extension stretches reviewed. Flexeril 5 mg each bedtime when necessary. Brief prednisone taper starting 40 mg daily over the next week. Vicodin one to 2 every 6 hours as needed for pain. Touch base 2 weeks if not improving

## 2011-08-27 NOTE — Patient Instructions (Addendum)
Back Pain, Adult Low back pain is very common. About 1 in 5 people have back pain.The cause of low back pain is rarely dangerous. The pain often gets better over time.About half of people with a sudden onset of back pain feel better in just 2 weeks. About 8 in 10 people feel better by 6 weeks.  CAUSES Some common causes of back pain include:  Strain of the muscles or ligaments supporting the spine.   Wear and tear (degeneration) of the spinal discs.   Arthritis.   Direct injury to the back.  DIAGNOSIS Most of the time, the direct cause of low back pain is not known.However, back pain can be treated effectively even when the exact cause of the pain is unknown.Answering your caregiver's questions about your overall health and symptoms is one of the most accurate ways to make sure the cause of your pain is not dangerous. If your caregiver needs more information, he or she may order lab work or imaging tests (X-rays or MRIs).However, even if imaging tests show changes in your back, this usually does not require surgery. HOME CARE INSTRUCTIONS For many people, back pain returns.Since low back pain is rarely dangerous, it is often a condition that people can learn to manageon their own.   Remain active. It is stressful on the back to sit or stand in one place. Do not sit, drive, or stand in one place for more than 30 minutes at a time. Take short walks on level surfaces as soon as pain allows.Try to increase the length of time you walk each day.   Do not stay in bed.Resting more than 1 or 2 days can delay your recovery.   Do not avoid exercise or work.Your body is made to move.It is not dangerous to be active, even though your back may hurt.Your back will likely heal faster if you return to being active before your pain is gone.   Pay attention to your body when you bend and lift. Many people have less discomfortwhen lifting if they bend their knees, keep the load close to their  bodies,and avoid twisting. Often, the most comfortable positions are those that put less stress on your recovering back.   Find a comfortable position to sleep. Use a firm mattress and lie on your side with your knees slightly bent. If you lie on your back, put a pillow under your knees.   Only take over-the-counter or prescription medicines as directed by your caregiver. Over-the-counter medicines to reduce pain and inflammation are often the most helpful.Your caregiver may prescribe muscle relaxant drugs.These medicines help dull your pain so you can more quickly return to your normal activities and healthy exercise.   Put ice on the injured area.   Put ice in a plastic bag.   Place a towel between your skin and the bag.   Leave the ice on for 15 to 20 minutes, 3 to 4 times a day for the first 2 to 3 days. After that, ice and heat may be alternated to reduce pain and spasms.   Ask your caregiver about trying back exercises and gentle massage. This may be of some benefit.   Avoid feeling anxious or stressed.Stress increases muscle tension and can worsen back pain.It is important to recognize when you are anxious or stressed and learn ways to manage it.Exercise is a great option.  SEEK MEDICAL CARE IF:  You have pain that is not relieved with rest or medicine.   You have   pain that does not improve in 1 week.   You have new symptoms.   You are generally not feeling well.  SEEK IMMEDIATE MEDICAL CARE IF:   You have pain that radiates from your back into your legs.   You develop new bowel or bladder control problems.   You have unusual weakness or numbness in your arms or legs.   You develop nausea or vomiting.   You develop abdominal pain.   You feel faint.  Document Released: 02/11/2005 Document Revised: 01/31/2011 Document Reviewed: 07/02/2010 [employer] Patient Information 2012 [employer]. 

## 2011-08-28 ENCOUNTER — Telehealth: Payer: Self-pay | Admitting: Family Medicine

## 2011-08-28 MED ORDER — PREDNISONE 10 MG PO TABS: ORAL_TABLET | ORAL | Status: DC

## 2011-08-28 MED ORDER — CYCLOBENZAPRINE HCL 5 MG PO TABS: 5.0000 mg | ORAL_TABLET | Freq: Three times a day (TID) | ORAL | Status: AC | PRN

## 2011-08-28 NOTE — Telephone Encounter (Signed)
Pt informed on her VM prednisone and flexeril were sent by accident to [facility] yesterday, so [facility].  The Norco was printed and given to her, she should have it somewhere.

## 2011-08-28 NOTE — Telephone Encounter (Signed)
[facility] Triage Call Report Triage Record Num: [id number] Operator: Patriciaann Clan Patient Name: Mia Peterson Call Date & Time: 08/27/2011 7:18:58PM Patient Phone: [phone] PCP: Evelena Peat Patient Gender: Female PCP Fax : [phone] Patient DOB: 03-15-1945 [facility] Name: [facility] Reason for Call: Caller: Mia Peterson/Patient; PCP: Evelena Peat; CB#: [phone]; Call regarding Medication Issue; Medication(s): Flexeril 5 mg and Deltasone 10 mg; Patient states Rx for Flexeril and Deltasone [facility] [address] @ States Rx's were called into [facility] instead. Patient states she spoke with office @ 1600 08/27/11 and was told that [facility]. [facility]. Patient states seh is leaving for vacation at 0600 08/28/11 and needs medications tonight. Patient requesting for Rx's to be [facility]. RN verified orders in Epic EHR for Cyclopenzaprine ( Flexeril); 5 mg.; one tablet by mouth TID as needed for muscle spasms; dispense # 30; one refill and Prednisone(Deltason) 10mg . tablet; Taper as follows: 4-4-4-4-3-3-2-1; Dispense # 25; no refills. Above Rx orders were sent to [facility]. [facility] [address] @ [phone]. Spoke with Pharmacist/James. Patient informed of above. Verbalizes understanding. RN spoke with Toni Amend at [facility]. Informed that Rx for Flexeril and Prednisone were called into a local Pharmacy and order needs to be cancelled. RX FOR PREDNISONE AND FLEXERIL W[facility]ED [facility] [address]. MAY DISREGARD FAX REQUEST SENT FROM PHARMACY ON 08/27/11. RX ORDERS FOR PREDNISONE AND FLEXERIL WERE CANCELLED AT [facility]. Protocol(s) Used: Office Note Recommended Outcome per Protocol: Information Noted and Sent to Office Reason for Outcome: Caller information to office Care Advice: ~

## 2011-09-12 ENCOUNTER — Other Ambulatory Visit: Payer: Self-pay | Admitting: Dermatology

## 2011-09-12 DIAGNOSIS — C44621 Squamous cell carcinoma of skin of unspecified upper limb, including shoulder: Secondary | ICD-10-CM | POA: Diagnosis not present

## 2011-09-12 DIAGNOSIS — D235 Other benign neoplasm of skin of trunk: Secondary | ICD-10-CM | POA: Diagnosis not present

## 2011-09-12 DIAGNOSIS — L570 Actinic keratosis: Secondary | ICD-10-CM | POA: Diagnosis not present

## 2011-09-12 DIAGNOSIS — D485 Neoplasm of uncertain behavior of skin: Secondary | ICD-10-CM | POA: Diagnosis not present

## 2011-09-12 DIAGNOSIS — L821 Other seborrheic keratosis: Secondary | ICD-10-CM | POA: Diagnosis not present

## 2011-10-03 ENCOUNTER — Other Ambulatory Visit: Payer: Self-pay | Admitting: Family Medicine

## 2011-10-24 ENCOUNTER — Other Ambulatory Visit: Payer: Self-pay | Admitting: Dermatology

## 2011-10-24 DIAGNOSIS — L570 Actinic keratosis: Secondary | ICD-10-CM | POA: Diagnosis not present

## 2011-10-24 DIAGNOSIS — C44621 Squamous cell carcinoma of skin of unspecified upper limb, including shoulder: Secondary | ICD-10-CM | POA: Diagnosis not present

## 2011-11-15 DIAGNOSIS — Z1231 Encounter for screening mammogram for malignant neoplasm of breast: Secondary | ICD-10-CM | POA: Diagnosis not present

## 2011-11-22 ENCOUNTER — Encounter: Payer: Self-pay | Admitting: Family Medicine

## 2011-12-06 DIAGNOSIS — H52209 Unspecified astigmatism, unspecified eye: Secondary | ICD-10-CM | POA: Diagnosis not present

## 2011-12-06 DIAGNOSIS — H04129 Dry eye syndrome of unspecified lacrimal gland: Secondary | ICD-10-CM | POA: Diagnosis not present

## 2011-12-06 DIAGNOSIS — H47329 Drusen of optic disc, unspecified eye: Secondary | ICD-10-CM | POA: Diagnosis not present

## 2011-12-23 ENCOUNTER — Telehealth: Payer: Self-pay | Admitting: Family Medicine

## 2011-12-23 NOTE — Telephone Encounter (Signed)
error 

## 2011-12-31 DIAGNOSIS — M1710 Unilateral primary osteoarthritis, unspecified knee: Secondary | ICD-10-CM | POA: Diagnosis not present

## 2011-12-31 DIAGNOSIS — G5760 Lesion of plantar nerve, unspecified lower limb: Secondary | ICD-10-CM | POA: Diagnosis not present

## 2012-01-07 DIAGNOSIS — G5760 Lesion of plantar nerve, unspecified lower limb: Secondary | ICD-10-CM | POA: Diagnosis not present

## 2012-01-07 DIAGNOSIS — M1710 Unilateral primary osteoarthritis, unspecified knee: Secondary | ICD-10-CM | POA: Diagnosis not present

## 2012-01-13 ENCOUNTER — Other Ambulatory Visit: Payer: Self-pay | Admitting: Family Medicine

## 2012-01-14 DIAGNOSIS — M1710 Unilateral primary osteoarthritis, unspecified knee: Secondary | ICD-10-CM | POA: Diagnosis not present

## 2012-01-16 ENCOUNTER — Encounter: Payer: Self-pay | Admitting: Family Medicine

## 2012-01-16 ENCOUNTER — Ambulatory Visit (INDEPENDENT_AMBULATORY_CARE_PROVIDER_SITE_OTHER): Payer: Medicare Other | Admitting: Family Medicine

## 2012-01-16 VITALS — BP 130/90 | HR 72 | Temp 98.0°F | Resp 12 | Ht 67.75 in | Wt 156.0 lb

## 2012-01-16 DIAGNOSIS — M949 Disorder of cartilage, unspecified: Secondary | ICD-10-CM | POA: Diagnosis not present

## 2012-01-16 DIAGNOSIS — F3289 Other specified depressive episodes: Secondary | ICD-10-CM | POA: Diagnosis not present

## 2012-01-16 DIAGNOSIS — R259 Unspecified abnormal involuntary movements: Secondary | ICD-10-CM | POA: Diagnosis not present

## 2012-01-16 DIAGNOSIS — M899 Disorder of bone, unspecified: Secondary | ICD-10-CM | POA: Diagnosis not present

## 2012-01-16 DIAGNOSIS — Z0000 Encounter for general adult medical examination without abnormal findings: Secondary | ICD-10-CM | POA: Diagnosis not present

## 2012-01-16 DIAGNOSIS — K219 Gastro-esophageal reflux disease without esophagitis: Secondary | ICD-10-CM | POA: Diagnosis not present

## 2012-01-16 DIAGNOSIS — M8580 Other specified disorders of bone density and structure, unspecified site: Secondary | ICD-10-CM

## 2012-01-16 DIAGNOSIS — R251 Tremor, unspecified: Secondary | ICD-10-CM

## 2012-01-16 DIAGNOSIS — F329 Major depressive disorder, single episode, unspecified: Secondary | ICD-10-CM

## 2012-01-16 MED ORDER — BUPROPION HCL ER (XL) 150 MG PO TB24: 150.0000 mg | ORAL_TABLET | Freq: Every day | ORAL | Status: DC

## 2012-01-16 MED ORDER — SOLIFENACIN SUCCINATE 5 MG PO TABS: 5.0000 mg | ORAL_TABLET | Freq: Every day | ORAL | Status: DC

## 2012-01-16 NOTE — Progress Notes (Signed)
Subjective:    [name], female    DOB: 04/20/1945, 66 y.o.   MRN: [id number]  HPI  Patient seen for Medicare well visit and medical followup. Her chronic problems include history of basal cell and squamous cell skin cancers. She is followed closely by dermatologist. She has history of GERD treated with omeprazole. She's had breakthrough symptoms when she tries to reduce this. She has history of rosacea. She's had prior history of depression and is requesting tapering off of Wellbutrin. She's been on this for several years now. Current mood is stable. She has history of urinary urgency treated with VESIcare. Requesting refills.  Last colonoscopy less than 10 years ago. Immunizations up to date. She declines further flu vaccines. Last bone density scan 2010 with borderline osteopenia.  Patient has the following acute issues. She's noticed very mild tremor frequently worse in morning. Upper extremities. Increased tremor with activities such as fine motor skills. Possibly had a grandparent with hereditary tremor. No rigidity. No gait changes. No weakness. Somewhat feels off balance at times. Possibly some mild vertigo intermittently. No focal weakness.  No past medical history on file. Past Surgical History  Procedure Date  . Breast enhancement surgery 1978  . Tubal ligation 1972  . Varicose vein surgery 1978  . Abdominal hysterectomy 1993    menorrhagia.    reports that she quit smoking about 21 years ago. Her smoking use included Cigarettes. She has a 20 pack-year smoking history. She does not have any smokeless tobacco history on file. Her alcohol and drug histories not on file. family history includes Cancer (age of onset:90) in her father and Heart disease (age of onset:63) in her mother. No Known Allergies      Review of Systems  Constitutional: Negative for fever, activity change, appetite change, fatigue and unexpected weight change.  HENT: Negative for hearing  loss, ear pain, sore throat and trouble swallowing.   Eyes: Negative for visual disturbance.  Respiratory: Negative for cough and shortness of breath.   Cardiovascular: Negative for chest pain and palpitations.  Gastrointestinal: Negative for abdominal pain, diarrhea, constipation and blood in stool.  Genitourinary: Negative for dysuria and hematuria.  Musculoskeletal: Positive for arthralgias (Mostly knees and followed by orthopedist). Negative for myalgias and back pain.  Skin: Negative for rash.  Neurological: Negative for dizziness, syncope and headaches.       No tremor appreciated at this time. No ataxia.  Hematological: Negative for adenopathy.  Psychiatric/Behavioral: Negative for confusion and dysphoric mood.       Objective:   Physical Exam  Constitutional: She is oriented to person, place, and time. She appears well-developed and well-nourished.  HENT:  Head: Normocephalic and atraumatic.  Eyes: EOM are normal. Pupils are equal, round, and reactive to light.  Neck: Normal range of motion. Neck supple. No thyromegaly present.  Cardiovascular: Normal rate, regular rhythm and normal heart sounds.   No murmur heard. Pulmonary/Chest: Breath sounds normal. No respiratory distress. She has no wheezes. She has no rales.  Abdominal: Soft. Bowel sounds are normal. She exhibits no distension and no mass. There is no tenderness. There is no rebound and no guarding.  Genitourinary:       Breasts are symmetric with no mass. Pelvic deferred as she has had hysterectomy for benign disease  Musculoskeletal: Normal range of motion. She exhibits no edema.  Lymphadenopathy:    She has no cervical adenopathy.  Neurological: She is alert and oriented to person, place, and time. She  displays normal reflexes. No cranial nerve deficit.       No tremor at this time.  Skin: No rash noted.  Psychiatric: She has a normal mood and affect. Her behavior is normal. Judgment and thought content normal.           Assessment & Plan:  #1 health maintenance. Continue yearly mammogram. Repeat DEXA scan next year with mammogram. Colonoscopy up to date. Patient declines flu vaccine though this was recommended. Pneumovax booster by next year. She's had previous shingles vaccine. We discussed the importance of regular calcium, vitamin D, and weightbearing exercise. #2 history of depression. Stable. Decreased Wellbutrin XL 150 mg once daily for one month then discontinue altogether if symptomatically stable  #3 history of urge urinary incontinence. Refill VESIcare for one year #4 history of GERD. Stable on low-dose omeprazole. She's tried several times to taper this without success

## 2012-01-16 NOTE — Patient Instructions (Addendum)
Continue regular weightbearing exercise Continue regular calcium and vitamin D supplementation Continue with yearly mammogram Consider bone density scan next year with mammogram Take Bupropion 150 mg once daily for one month, then discontinue

## 2012-01-17 LAB — VITAMIN D 25 HYDROXY (VIT D DEFICIENCY, FRACTURES): Vit D, 25-Hydroxy: 47 ng/mL (ref 30–89)

## 2012-01-21 DIAGNOSIS — M1710 Unilateral primary osteoarthritis, unspecified knee: Secondary | ICD-10-CM | POA: Diagnosis not present

## 2012-01-30 DIAGNOSIS — M1710 Unilateral primary osteoarthritis, unspecified knee: Secondary | ICD-10-CM | POA: Diagnosis not present

## 2012-02-03 DIAGNOSIS — L570 Actinic keratosis: Secondary | ICD-10-CM | POA: Diagnosis not present

## 2012-02-03 DIAGNOSIS — L821 Other seborrheic keratosis: Secondary | ICD-10-CM | POA: Diagnosis not present

## 2012-02-03 DIAGNOSIS — L719 Rosacea, unspecified: Secondary | ICD-10-CM | POA: Diagnosis not present

## 2012-02-03 DIAGNOSIS — Z85828 Personal history of other malignant neoplasm of skin: Secondary | ICD-10-CM | POA: Diagnosis not present

## 2012-03-03 DIAGNOSIS — Q667 Congenital pes cavus, unspecified foot: Secondary | ICD-10-CM | POA: Diagnosis not present

## 2012-05-05 ENCOUNTER — Other Ambulatory Visit: Payer: Self-pay | Admitting: *Deleted

## 2012-05-05 MED ORDER — SOLIFENACIN SUCCINATE 5 MG PO TABS: 5.0000 mg | ORAL_TABLET | Freq: Every day | ORAL | Status: DC

## 2012-05-29 DIAGNOSIS — H01009 Unspecified blepharitis unspecified eye, unspecified eyelid: Secondary | ICD-10-CM | POA: Diagnosis not present

## 2012-09-08 DIAGNOSIS — Q667 Congenital pes cavus, unspecified foot: Secondary | ICD-10-CM | POA: Diagnosis not present

## 2012-09-22 ENCOUNTER — Other Ambulatory Visit: Payer: Self-pay | Admitting: Dermatology

## 2012-09-22 DIAGNOSIS — C44621 Squamous cell carcinoma of skin of unspecified upper limb, including shoulder: Secondary | ICD-10-CM | POA: Diagnosis not present

## 2012-09-22 DIAGNOSIS — L819 Disorder of pigmentation, unspecified: Secondary | ICD-10-CM | POA: Diagnosis not present

## 2012-09-22 DIAGNOSIS — H04129 Dry eye syndrome of unspecified lacrimal gland: Secondary | ICD-10-CM | POA: Diagnosis not present

## 2012-09-22 DIAGNOSIS — D0460 Carcinoma in situ of skin of unspecified upper limb, including shoulder: Secondary | ICD-10-CM | POA: Diagnosis not present

## 2012-09-22 DIAGNOSIS — L570 Actinic keratosis: Secondary | ICD-10-CM | POA: Diagnosis not present

## 2012-09-22 DIAGNOSIS — L820 Inflamed seborrheic keratosis: Secondary | ICD-10-CM | POA: Diagnosis not present

## 2012-09-22 DIAGNOSIS — Z85828 Personal history of other malignant neoplasm of skin: Secondary | ICD-10-CM | POA: Diagnosis not present

## 2012-09-22 DIAGNOSIS — D045 Carcinoma in situ of skin of trunk: Secondary | ICD-10-CM | POA: Diagnosis not present

## 2012-09-29 DIAGNOSIS — M25579 Pain in unspecified ankle and joints of unspecified foot: Secondary | ICD-10-CM | POA: Diagnosis not present

## 2012-09-29 DIAGNOSIS — M19079 Primary osteoarthritis, unspecified ankle and foot: Secondary | ICD-10-CM | POA: Diagnosis not present

## 2012-10-08 ENCOUNTER — Other Ambulatory Visit: Payer: Self-pay | Admitting: Dermatology

## 2012-10-08 DIAGNOSIS — C44621 Squamous cell carcinoma of skin of unspecified upper limb, including shoulder: Secondary | ICD-10-CM | POA: Diagnosis not present

## 2012-10-08 DIAGNOSIS — Z85828 Personal history of other malignant neoplasm of skin: Secondary | ICD-10-CM | POA: Diagnosis not present

## 2012-12-11 DIAGNOSIS — H01009 Unspecified blepharitis unspecified eye, unspecified eyelid: Secondary | ICD-10-CM | POA: Diagnosis not present

## 2012-12-11 DIAGNOSIS — H04129 Dry eye syndrome of unspecified lacrimal gland: Secondary | ICD-10-CM | POA: Diagnosis not present

## 2012-12-11 DIAGNOSIS — H47329 Drusen of optic disc, unspecified eye: Secondary | ICD-10-CM | POA: Diagnosis not present

## 2012-12-17 DIAGNOSIS — Z1231 Encounter for screening mammogram for malignant neoplasm of breast: Secondary | ICD-10-CM | POA: Diagnosis not present

## 2013-01-05 DIAGNOSIS — M1710 Unilateral primary osteoarthritis, unspecified knee: Secondary | ICD-10-CM | POA: Diagnosis not present

## 2013-01-05 DIAGNOSIS — M25569 Pain in unspecified knee: Secondary | ICD-10-CM | POA: Diagnosis not present

## 2013-01-11 ENCOUNTER — Encounter: Payer: Self-pay | Admitting: Family Medicine

## 2013-01-12 DIAGNOSIS — M1710 Unilateral primary osteoarthritis, unspecified knee: Secondary | ICD-10-CM | POA: Diagnosis not present

## 2013-01-19 ENCOUNTER — Ambulatory Visit (INDEPENDENT_AMBULATORY_CARE_PROVIDER_SITE_OTHER): Payer: Medicare Other | Admitting: Family Medicine

## 2013-01-19 ENCOUNTER — Encounter: Payer: Self-pay | Admitting: Family Medicine

## 2013-01-19 VITALS — BP 120/80 | Temp 97.7°F | Ht 68.0 in | Wt 165.0 lb

## 2013-01-19 DIAGNOSIS — Z0000 Encounter for general adult medical examination without abnormal findings: Secondary | ICD-10-CM

## 2013-01-19 DIAGNOSIS — R5381 Other malaise: Secondary | ICD-10-CM | POA: Diagnosis not present

## 2013-01-19 DIAGNOSIS — K219 Gastro-esophageal reflux disease without esophagitis: Secondary | ICD-10-CM | POA: Diagnosis not present

## 2013-01-19 DIAGNOSIS — Z23 Encounter for immunization: Secondary | ICD-10-CM | POA: Diagnosis not present

## 2013-01-19 DIAGNOSIS — R3915 Urgency of urination: Secondary | ICD-10-CM | POA: Diagnosis not present

## 2013-01-19 DIAGNOSIS — R5383 Other fatigue: Secondary | ICD-10-CM

## 2013-01-19 LAB — CBC WITH DIFFERENTIAL/PLATELET
Basophils Absolute: 0 10*3/uL (ref 0.0–0.1)
Basophils Relative: 0.4 % (ref 0.0–3.0)
Eosinophils Absolute: 0.1 10*3/uL (ref 0.0–0.7)
Eosinophils Relative: 1.8 % (ref 0.0–5.0)
HCT: 40.7 % (ref 36.0–46.0)
Hemoglobin: 13.6 g/dL (ref 12.0–15.0)
Lymphocytes Relative: 25.9 % (ref 12.0–46.0)
Lymphs Abs: 1.6 10*3/uL (ref 0.7–4.0)
MCHC: 33.5 g/dL (ref 30.0–36.0)
Monocytes Relative: 9.1 % (ref 3.0–12.0)
Neutro Abs: 3.8 10*3/uL (ref 1.4–7.7)
RBC: 4.65 Mil/uL (ref 3.87–5.11)
RDW: 14 % (ref 11.5–14.6)

## 2013-01-19 LAB — VITAMIN B12: Vitamin B-12: 512 pg/mL (ref 211–911)

## 2013-01-19 LAB — BASIC METABOLIC PANEL
CO2: 30 mEq/L (ref 19–32)
Calcium: 9.7 mg/dL (ref 8.4–10.5)
Creatinine, Ser: 0.8 mg/dL (ref 0.4–1.2)
GFR: 80.49 mL/min (ref >60.00)
Glucose, Bld: 96 mg/dL (ref 70–99)
Sodium: 139 mEq/L (ref 135–145)

## 2013-01-19 MED ORDER — MIRABEGRON ER 25 MG PO TB24: 25.0000 mg | ORAL_TABLET | Freq: Every day | ORAL | Status: DC

## 2013-01-19 NOTE — Progress Notes (Signed)
Pre visit review using our clinic review tool, if applicable. No additional management support is needed unless otherwise documented below in the visit note. 

## 2013-01-19 NOTE — Progress Notes (Signed)
Subjective:    [name], female    DOB: 25-Nov-1945, 67 y.o.   MRN: [id number]  HPI Patient seen for Medicare wellness exam and medical followup. Generally very healthy. Immunizations are up to date with exception that she needs pneumococcal vaccine. She has never had Prevnar 13. Her prior 23 valent pneumococcal vaccine was prior to age 48 and this was 5 years ago.  She has history of recurrent basal cell and squamous cell skin cancers and sees dermatologist every 6 months. She has history of urine urgency he has been taking VESIcare 5 mg once daily is still getting up 3 times per Still some urgency symptoms. She would like to explore other options. No recent burning with urination.  She has some chronic fatigue issues and thinks some of this may be related to sleep disruption. She takes chronic PPI with omeprazole for GERD. She's not had recent B12 levels. She's tried tapering off omeprazole multiple times without success. No recent dysphagia.  No past medical history on file. Past Surgical History  Procedure Laterality Date  . Breast enhancement surgery  1978  . Tubal ligation  1972  . Varicose vein surgery  1978  . Abdominal hysterectomy  1993    menorrhagia.    reports that she quit smoking about 22 years ago. Her smoking use included Cigarettes. She has a 20 pack-year smoking history. She does not have any smokeless tobacco history on file. Her alcohol and drug histories are not on file. family history includes Cancer (age of onset: 8) in her father; Heart disease (age of onset: 52) in her mother. No Known Allergies     Review of Systems  Constitutional: Positive for fatigue. Negative for fever, activity change, appetite change and unexpected weight change.  HENT: Negative for ear pain, hearing loss, sore throat and trouble swallowing.   Eyes: Negative for visual disturbance.  Respiratory: Negative for cough and shortness of breath.   Cardiovascular:  Negative for chest pain and palpitations.  Gastrointestinal: Negative for abdominal pain, diarrhea, constipation and blood in stool.  Endocrine: Negative for polydipsia and polyuria.  Genitourinary: Positive for urgency. Negative for dysuria, hematuria and decreased urine volume.  Musculoskeletal: Negative for arthralgias, back pain and myalgias.  Skin: Negative for rash.  Neurological: Negative for dizziness, syncope and headaches.  Hematological: Negative for adenopathy.  Psychiatric/Behavioral: Positive for sleep disturbance. Negative for confusion and dysphoric mood.       Objective:   Physical Exam  Constitutional: She is oriented to person, place, and time. She appears well-developed and well-nourished.  HENT:  Head: Normocephalic and atraumatic.  Eyes: EOM are normal. Pupils are equal, round, and reactive to light.  Neck: Normal range of motion. Neck supple. No thyromegaly present.  Cardiovascular: Normal rate, regular rhythm and normal heart sounds.   No murmur heard. Pulmonary/Chest: Breath sounds normal. No respiratory distress. She has no wheezes. She has no rales.  Abdominal: Soft. Bowel sounds are normal. She exhibits no distension and no mass. There is no tenderness. There is no rebound and no guarding.  Genitourinary:  Previous hysterectomy so Pap smear deferred Breasts are symmetric with no mass  Musculoskeletal: Normal range of motion. She exhibits no edema.  Lymphadenopathy:    She has no cervical adenopathy.  Neurological: She is alert and oriented to person, place, and time. She displays normal reflexes. No cranial nerve deficit.  Skin: No rash noted.  Psychiatric: She has a normal mood and affect. Her behavior is normal. Judgment  and thought content normal.          Assessment & Plan:  #1 health maintenance. Flu vaccine given. Recent mammogram normal. Colonoscopy about 5 years ago. Prevnar 13 given. Previous hysterectomy so no indication for pap. #2  history of GERD. Symptomatically stable on omeprazole. #3 history of urinary urgency. Poorly controlled with VESIcare. Trial of Myrbetriq 25 mg once daily. #4 fatigue which may be related to sleep disruption from #3. Check labs with CBC, TSH, B12 (especially in view of chronic PPI use).

## 2013-01-19 NOTE — Patient Instructions (Signed)
Hold VESIcare for now Start Myrbetriq 25 mg one at night. Continue with yearly flu vaccine and mammograms

## 2013-01-26 DIAGNOSIS — M1710 Unilateral primary osteoarthritis, unspecified knee: Secondary | ICD-10-CM | POA: Diagnosis not present

## 2013-02-02 DIAGNOSIS — M1710 Unilateral primary osteoarthritis, unspecified knee: Secondary | ICD-10-CM | POA: Diagnosis not present

## 2013-02-03 ENCOUNTER — Telehealth: Payer: Self-pay | Admitting: Family Medicine

## 2013-02-03 NOTE — Telephone Encounter (Signed)
Pt updated her phone number and would like yo to call w/ lab results

## 2013-02-03 NOTE — Telephone Encounter (Signed)
Left message for patient to return call.

## 2013-02-04 NOTE — Telephone Encounter (Signed)
Pt informed

## 2013-02-09 DIAGNOSIS — M1710 Unilateral primary osteoarthritis, unspecified knee: Secondary | ICD-10-CM | POA: Diagnosis not present

## 2013-02-10 ENCOUNTER — Other Ambulatory Visit: Payer: Self-pay | Admitting: Dermatology

## 2013-02-10 DIAGNOSIS — L570 Actinic keratosis: Secondary | ICD-10-CM | POA: Diagnosis not present

## 2013-02-10 DIAGNOSIS — C44621 Squamous cell carcinoma of skin of unspecified upper limb, including shoulder: Secondary | ICD-10-CM | POA: Diagnosis not present

## 2013-02-10 DIAGNOSIS — L821 Other seborrheic keratosis: Secondary | ICD-10-CM | POA: Diagnosis not present

## 2013-02-10 DIAGNOSIS — Z85828 Personal history of other malignant neoplasm of skin: Secondary | ICD-10-CM | POA: Diagnosis not present

## 2013-04-21 DIAGNOSIS — L819 Disorder of pigmentation, unspecified: Secondary | ICD-10-CM | POA: Diagnosis not present

## 2013-04-21 DIAGNOSIS — B009 Herpesviral infection, unspecified: Secondary | ICD-10-CM | POA: Diagnosis not present

## 2013-04-21 DIAGNOSIS — Z85828 Personal history of other malignant neoplasm of skin: Secondary | ICD-10-CM | POA: Diagnosis not present

## 2013-04-21 DIAGNOSIS — L738 Other specified follicular disorders: Secondary | ICD-10-CM | POA: Diagnosis not present

## 2013-04-21 DIAGNOSIS — L820 Inflamed seborrheic keratosis: Secondary | ICD-10-CM | POA: Diagnosis not present

## 2013-04-21 DIAGNOSIS — L821 Other seborrheic keratosis: Secondary | ICD-10-CM | POA: Diagnosis not present

## 2013-06-01 DIAGNOSIS — M79609 Pain in unspecified limb: Secondary | ICD-10-CM | POA: Diagnosis not present

## 2013-06-10 DIAGNOSIS — I8310 Varicose veins of unspecified lower extremity with inflammation: Secondary | ICD-10-CM | POA: Diagnosis not present

## 2013-06-24 DIAGNOSIS — M79609 Pain in unspecified limb: Secondary | ICD-10-CM | POA: Diagnosis not present

## 2013-06-24 DIAGNOSIS — I8310 Varicose veins of unspecified lower extremity with inflammation: Secondary | ICD-10-CM | POA: Diagnosis not present

## 2013-07-08 DIAGNOSIS — M7981 Nontraumatic hematoma of soft tissue: Secondary | ICD-10-CM | POA: Diagnosis not present

## 2013-07-08 DIAGNOSIS — M79609 Pain in unspecified limb: Secondary | ICD-10-CM | POA: Diagnosis not present

## 2013-07-08 DIAGNOSIS — I8310 Varicose veins of unspecified lower extremity with inflammation: Secondary | ICD-10-CM | POA: Diagnosis not present

## 2013-07-14 ENCOUNTER — Telehealth: Payer: Self-pay | Admitting: Family Medicine

## 2013-07-14 DIAGNOSIS — I8310 Varicose veins of unspecified lower extremity with inflammation: Secondary | ICD-10-CM | POA: Diagnosis not present

## 2013-07-14 DIAGNOSIS — M7981 Nontraumatic hematoma of soft tissue: Secondary | ICD-10-CM | POA: Diagnosis not present

## 2013-07-14 DIAGNOSIS — M79609 Pain in unspecified limb: Secondary | ICD-10-CM | POA: Diagnosis not present

## 2013-07-14 NOTE — Telephone Encounter (Signed)
Patient is requesting if she can have a prescription for Myrbetriq tablets 25 MG. She is through taking her 3 month supply of Vesicare tablets and prefers to take the Myrbetriq. She would also like to know if she can have a 3 month supply of this medication [facility].

## 2013-07-14 NOTE — Telephone Encounter (Signed)
Yes.  Take Vesicare off list and start Myrbetriq 25 mg once daily #90 with 3 refills.

## 2013-07-15 MED ORDER — MIRABEGRON ER 25 MG PO TB24: 25.0000 mg | ORAL_TABLET | Freq: Every day | ORAL | Status: DC

## 2013-07-15 NOTE — Telephone Encounter (Signed)
Rx sent to pharmacy   

## 2013-07-22 DIAGNOSIS — M7981 Nontraumatic hematoma of soft tissue: Secondary | ICD-10-CM | POA: Diagnosis not present

## 2013-07-22 DIAGNOSIS — I8310 Varicose veins of unspecified lower extremity with inflammation: Secondary | ICD-10-CM | POA: Diagnosis not present

## 2013-07-22 DIAGNOSIS — M79609 Pain in unspecified limb: Secondary | ICD-10-CM | POA: Diagnosis not present

## 2013-07-26 ENCOUNTER — Telehealth: Payer: Self-pay

## 2013-07-26 ENCOUNTER — Telehealth: Payer: Self-pay | Admitting: Family Medicine

## 2013-07-26 NOTE — Telephone Encounter (Signed)
Patient wants a Rx for antidiarrhea, pain medications and antibiotic for gastrointestinal. For an upcoming trip on June 10 th

## 2013-07-26 NOTE — Telephone Encounter (Signed)
Imodium (OTC) is as good as any anti-motility agent.  We could call call on Cipro 500 mg po bid for 3 days prn traveler's diarrhea.  We need to know where she is going as parts of [place] and [place] are becoming resistent to Cipro. Unless she is having acute pain issue, we do not generally write for opioids.

## 2013-07-26 NOTE — Telephone Encounter (Signed)
Patient  wants a Rx for antidiarrhea, pain medications and antibiotic for gastrointestinal. For an upcoming trip on June 10 th. Advised patient per assistant that the antidiarrhea can be obtained from over-the-counter. Patient then stated that he was told to get a prescription instead.

## 2013-07-27 MED ORDER — CIPROFLOXACIN HCL 500 MG PO TABS: 500.0000 mg | ORAL_TABLET | Freq: Two times a day (BID) | ORAL | Status: DC | PRN

## 2013-07-27 NOTE — Telephone Encounter (Signed)
Pt is going to [place]. Rx is sent to  Pharmacy. Left message on VM for patient.

## 2013-07-30 ENCOUNTER — Telehealth: Payer: Self-pay | Admitting: Family Medicine

## 2013-07-30 MED ORDER — SCOPOLAMINE 1 MG/3DAYS TD PT72: MEDICATED_PATCH | TRANSDERMAL | Status: DC

## 2013-07-30 NOTE — Telephone Encounter (Signed)
Scopolamine patch one behind ear every 72 hours prn motion sickness, disp #10 patches.

## 2013-07-30 NOTE — Telephone Encounter (Signed)
Pt is wanting to know if dr. Elease Hashimoto can provide an rx for motion sickness patches. Pt will be leaving on Wednesday to the country of  [place] and will be on a boat for 7 days. Please send to [facility].

## 2013-07-30 NOTE — Telephone Encounter (Signed)
Sent Rx to pharmacy and left message on patient VM

## 2013-08-26 DIAGNOSIS — M79609 Pain in unspecified limb: Secondary | ICD-10-CM | POA: Diagnosis not present

## 2013-09-01 DIAGNOSIS — L570 Actinic keratosis: Secondary | ICD-10-CM | POA: Diagnosis not present

## 2013-09-01 DIAGNOSIS — L821 Other seborrheic keratosis: Secondary | ICD-10-CM | POA: Diagnosis not present

## 2013-09-01 DIAGNOSIS — Z85828 Personal history of other malignant neoplasm of skin: Secondary | ICD-10-CM | POA: Diagnosis not present

## 2013-09-01 DIAGNOSIS — L820 Inflamed seborrheic keratosis: Secondary | ICD-10-CM | POA: Diagnosis not present

## 2013-09-09 ENCOUNTER — Ambulatory Visit (INDEPENDENT_AMBULATORY_CARE_PROVIDER_SITE_OTHER): Payer: Medicare Other | Admitting: Family Medicine

## 2013-09-09 ENCOUNTER — Encounter: Payer: Self-pay | Admitting: Family Medicine

## 2013-09-09 VITALS — BP 126/78 | HR 74 | Temp 98.4°F | Wt 166.0 lb

## 2013-09-09 DIAGNOSIS — R55 Syncope and collapse: Secondary | ICD-10-CM | POA: Diagnosis not present

## 2013-09-09 DIAGNOSIS — M79609 Pain in unspecified limb: Secondary | ICD-10-CM

## 2013-09-09 DIAGNOSIS — M654 Radial styloid tenosynovitis [de Quervain]: Secondary | ICD-10-CM

## 2013-09-09 DIAGNOSIS — M79645 Pain in left finger(s): Secondary | ICD-10-CM

## 2013-09-09 MED ORDER — METHYLPREDNISOLONE ACETATE 40 MG/ML IJ SUSP
20.0000 mg | Freq: Once | INTRAMUSCULAR | Status: AC
  Administered 2013-09-09: 20 mg via INTRA_ARTICULAR

## 2013-09-09 NOTE — Progress Notes (Signed)
   Subjective:    [name], female    DOB: 1945/05/08, 68 y.o.   MRN: [id number]  Wrist Pain    Left wrist pain. Patient has had about 3 months of pain involving her radial aspect left wrist and left thumb. No injury. Pain is worse with gripping. She's tried some icing and ibuprofen without improvement. No visible swelling. No erythema. No ecchymosis.  Patient relates she had syncopal episode back couple months ago.   She was flying overseas. She recalls feeling nauseous and got up to go the bathroom while in flight and became diaphoretic and passed out. She was evaluated by a physician in [place] and had several tests including EKG and some labs which were unremarkable. No history of syncope or dizziness since then. No chest pains or palpitations.  Brings copy of EKG which is reviewed and no acute changes.  No past medical history on file. Past Surgical History  Procedure Laterality Date  . Breast enhancement surgery  1978  . Tubal ligation  1972  . Varicose vein surgery  1978  . Abdominal hysterectomy  1993    menorrhagia.    reports that she quit smoking about 23 years ago. Her smoking use included Cigarettes. She has a 20 pack-year smoking history. She does not have any smokeless tobacco history on file. Her alcohol and drug histories are not on file. family history includes Cancer (age of onset: 36) in her father; Heart disease (age of onset: 27) in her mother. No Known Allergies    Review of Systems  Constitutional: Negative for fatigue and unexpected weight change.  Eyes: Negative for visual disturbance.  Respiratory: Negative for cough, chest tightness, shortness of breath and wheezing.   Cardiovascular: Negative for chest pain, palpitations and leg swelling.  Neurological: Negative for dizziness, seizures, syncope, weakness, light-headedness and headaches.       Objective:   Physical Exam  Constitutional: She is oriented to person, place, and time. She  appears well-developed and well-nourished.  Eyes: Pupils are equal, round, and reactive to light.  Neck: Neck supple. No thyromegaly present.  Cardiovascular: Normal rate and regular rhythm.  Exam reveals no gallop.   No murmur heard. Pulmonary/Chest: Effort normal and breath sounds normal. No respiratory distress. She has no wheezes. She has no rales.  Musculoskeletal: She exhibits no edema.  Left wrist reveals tenderness over the extensor tendon of the left thumb. No swelling. No erythema. No warmth. Full grip strength.  Neurological: She is alert and oriented to person, place, and time. She has normal reflexes. No cranial nerve deficit. Coordination normal.          Assessment & Plan:  #1 de Quervain's tenosynovitis left thumb. She has already tried icing and Advil without relief. We discussed risk and benefits of corticosteroid injection. Patient consented.  Using sterile technique injected 1/2 cc of Depo-Medrol with 25-gauge 5/8 inch needle parallel to the tendon sheath. Patient tolerated well.  Continue icing. Consider thumb spica splint. Touch base 2-3 weeks if no better #2 recent syncopal episode. By description, suspect vasovagal. She's had none whatsoever since then. Followup promptly for any recurrent dizziness or syncope

## 2013-09-09 NOTE — Patient Instructions (Signed)
De Quervain's Disease °De Quervain's disease is a condition often seen in racquet sports where there is a soreness (inflammation) in the cord like structures (tendons) which attach muscle to bone on the thumb side of the wrist. There may be a tightening of the tissuesaround the tendons. This condition is often helped by giving up or modifying the activity which caused it. When conservative treatment does not help, surgery may be required. Conservative treatment could include changes in the activity which brought about the problem or made it worse. Anti-inflammatory medications and injections may be used to help decrease the inflammation and help with pain control. Your caregiver will help you determine which is best for you. °DIAGNOSIS  °Often the diagnosis (learning what is wrong) can be made by examination. Sometimes x-rays are required. °HOME CARE INSTRUCTIONS  °· Apply ice to the sore area for 15-20 minutes, 03-04 times per day while awake. Put the ice in a plastic bag and place a towel between the bag of ice and your skin. This is especially helpful if it can be done after all activities involving the sore wrist. °· Temporary splinting may help. °· Only take over-the-counter or prescription medicines for pain, discomfort or fever as directed by your caregiver. °SEEK MEDICAL CARE IF:  °· Pain relief is not obtained with medications, or if you have increasing pain and seem to be getting worse rather than better. °MAKE SURE YOU:  °· Understand these instructions. °· Will watch your condition. °· Will get help right away if you are not doing well or get worse. °Document Released: 11/06/2000 Document Revised: 05/06/2011 Document Reviewed: 02/11/2005 °[employer]® Patient Information ©2015 [employer]. This information is not intended to replace advice given to you by your health care provider. Make sure you discuss any questions you have with your health care provider. ° °

## 2013-09-09 NOTE — Progress Notes (Signed)
Pre visit review using our clinic review tool, if applicable. No additional management support is needed unless otherwise documented below in the visit note. 

## 2013-09-28 DIAGNOSIS — M1710 Unilateral primary osteoarthritis, unspecified knee: Secondary | ICD-10-CM | POA: Diagnosis not present

## 2013-10-05 DIAGNOSIS — M1710 Unilateral primary osteoarthritis, unspecified knee: Secondary | ICD-10-CM | POA: Diagnosis not present

## 2013-10-12 DIAGNOSIS — M1710 Unilateral primary osteoarthritis, unspecified knee: Secondary | ICD-10-CM | POA: Diagnosis not present

## 2013-10-19 DIAGNOSIS — M1710 Unilateral primary osteoarthritis, unspecified knee: Secondary | ICD-10-CM | POA: Diagnosis not present

## 2013-10-25 ENCOUNTER — Encounter: Payer: Self-pay | Admitting: Family Medicine

## 2013-10-25 ENCOUNTER — Ambulatory Visit (INDEPENDENT_AMBULATORY_CARE_PROVIDER_SITE_OTHER): Payer: Medicare Other | Admitting: Family Medicine

## 2013-10-25 VITALS — BP 124/78 | HR 82 | Temp 98.1°F | Wt 158.0 lb

## 2013-10-25 DIAGNOSIS — L259 Unspecified contact dermatitis, unspecified cause: Secondary | ICD-10-CM | POA: Diagnosis not present

## 2013-10-25 MED ORDER — METHYLPREDNISOLONE ACETATE 80 MG/ML IJ SUSP
80.0000 mg | Freq: Once | INTRAMUSCULAR | Status: AC
  Administered 2013-10-25: 80 mg via INTRAMUSCULAR

## 2013-10-25 NOTE — Progress Notes (Signed)
   Subjective:    [name], female    DOB: 1945/04/27, 68 y.o.   MRN: [id number]  Urticaria Pertinent negatives include no fever.   Patient seen with chief complaint of "hives".  Onset Friday night. She denies any prior history of hives. No known food allergies. No clear precipitants. She has been working a lot in her basement cleaning out some things over the past week. She noticed involvement of her face as well as neck and trunk. Patient had some leftover Atarax and doxepin which she took with minimal relief of itching at night. She's had progressive rash since then. No other alleviating factors. Exacerbated by heat.  No past medical history on file. Past Surgical History  Procedure Laterality Date  . Breast enhancement surgery  1978  . Tubal ligation  1972  . Varicose vein surgery  1978  . Abdominal hysterectomy  1993    menorrhagia.    reports that she quit smoking about 23 years ago. Her smoking use included Cigarettes. She has a 20 pack-year smoking history. She does not have any smokeless tobacco history on file. Her alcohol and drug histories are not on file. family history includes Cancer (age of onset: 38) in her father; Heart disease (age of onset: 24) in her mother. No Known Allergies    Review of Systems  Constitutional: Negative for fever and chills.  Skin: Positive for rash.       Objective:   Physical Exam  Constitutional: She appears well-developed and well-nourished.  Cardiovascular: Normal rate and regular rhythm.   Skin: Rash noted.  Patient has scattered erythematous rash which is minimally raised. A couple of areas including right for head and right cheek she has minimal serous oozing. She has scattered areas of patches along her trunk and extremities as well          Assessment & Plan:  Contact dermatitis. She'll continue Atarax as needed for symptom relief. Depo-Medrol 80 mg IM given. Touch base in a couple days if not improving

## 2013-10-25 NOTE — Patient Instructions (Signed)

## 2013-10-25 NOTE — Progress Notes (Signed)
Pre visit review using our clinic review tool, if applicable. No additional management support is needed unless otherwise documented below in the visit note. 

## 2013-10-26 ENCOUNTER — Telehealth: Payer: Self-pay | Admitting: Family Medicine

## 2013-10-26 DIAGNOSIS — M1710 Unilateral primary osteoarthritis, unspecified knee: Secondary | ICD-10-CM | POA: Diagnosis not present

## 2013-10-26 MED ORDER — PREDNISONE 10 MG PO TABS: ORAL_TABLET | ORAL | Status: DC

## 2013-10-26 NOTE — Telephone Encounter (Signed)
Rx sent to pharmacy   

## 2013-10-26 NOTE — Telephone Encounter (Signed)
Prednisone 10 mg-taper: 4-4-4-3-3-3-2-2-1-1 #27

## 2013-10-26 NOTE — Telephone Encounter (Signed)
Pt was seen yesterday for contact dermatitis and pt is now requesting prednisone call into [facility] [address]. Please call pt once rx has been sent to [facility]

## 2013-11-23 DIAGNOSIS — I8310 Varicose veins of unspecified lower extremity with inflammation: Secondary | ICD-10-CM | POA: Diagnosis not present

## 2013-11-29 DIAGNOSIS — I87393 Chronic venous hypertension (idiopathic) with other complications of bilateral lower extremity: Secondary | ICD-10-CM | POA: Diagnosis not present

## 2013-12-07 DIAGNOSIS — R159 Full incontinence of feces: Secondary | ICD-10-CM | POA: Diagnosis not present

## 2013-12-07 DIAGNOSIS — R152 Fecal urgency: Secondary | ICD-10-CM | POA: Diagnosis not present

## 2013-12-07 DIAGNOSIS — Z1211 Encounter for screening for malignant neoplasm of colon: Secondary | ICD-10-CM | POA: Diagnosis not present

## 2013-12-07 DIAGNOSIS — K640 First degree hemorrhoids: Secondary | ICD-10-CM | POA: Diagnosis not present

## 2013-12-27 DIAGNOSIS — Z1231 Encounter for screening mammogram for malignant neoplasm of breast: Secondary | ICD-10-CM | POA: Diagnosis not present

## 2013-12-27 DIAGNOSIS — H04123 Dry eye syndrome of bilateral lacrimal glands: Secondary | ICD-10-CM | POA: Diagnosis not present

## 2013-12-27 DIAGNOSIS — H52203 Unspecified astigmatism, bilateral: Secondary | ICD-10-CM | POA: Diagnosis not present

## 2014-01-03 ENCOUNTER — Ambulatory Visit (INDEPENDENT_AMBULATORY_CARE_PROVIDER_SITE_OTHER): Payer: Medicare Other | Admitting: Family Medicine

## 2014-01-03 ENCOUNTER — Encounter: Payer: Self-pay | Admitting: Family Medicine

## 2014-01-03 VITALS — BP 126/82 | HR 78 | Temp 97.4°F | Wt 163.0 lb

## 2014-01-03 DIAGNOSIS — K219 Gastro-esophageal reflux disease without esophagitis: Secondary | ICD-10-CM

## 2014-01-03 DIAGNOSIS — Z23 Encounter for immunization: Secondary | ICD-10-CM | POA: Diagnosis not present

## 2014-01-03 DIAGNOSIS — R1031 Right lower quadrant pain: Secondary | ICD-10-CM | POA: Diagnosis not present

## 2014-01-03 LAB — POCT URINALYSIS DIPSTICK
BILIRUBIN UA: NEGATIVE
Glucose, UA: NEGATIVE
Ketones, UA: NEGATIVE
Nitrite, UA: NEGATIVE
PH UA: 5.5
Protein, UA: NEGATIVE
Urobilinogen, UA: 1

## 2014-01-03 NOTE — Patient Instructions (Signed)
Abdominal Pain Many things can cause abdominal pain. Usually, abdominal pain is not caused by a disease and will improve without treatment. It can often be observed and treated at home. Your health care provider will do a physical exam and possibly order blood tests and X-rays to help determine the seriousness of your pain. However, in many cases, more time must pass before a clear cause of the pain can be found. Before that point, your health care provider may not know if you need more testing or further treatment. HOME CARE INSTRUCTIONS  Monitor your abdominal pain for any changes. The following actions may help to alleviate any discomfort you are experiencing:  Only take over-the-counter or prescription medicines as directed by your health care provider.  Do not take laxatives unless directed to do so by your health care provider.  Try a clear liquid diet (broth, tea, or water) as directed by your health care provider. Slowly move to a bland diet as tolerated. SEEK MEDICAL CARE IF:  You have unexplained abdominal pain.  You have abdominal pain associated with nausea or diarrhea.  You have pain when you urinate or have a bowel movement.  You experience abdominal pain that wakes you in the night.  You have abdominal pain that is worsened or improved by eating food.  You have abdominal pain that is worsened with eating fatty foods.  You have a fever. SEEK IMMEDIATE MEDICAL CARE IF:   Your pain does not go away within 2 hours.  You keep throwing up (vomiting).  Your pain is felt only in portions of the abdomen, such as the right side or the left lower portion of the abdomen.  You pass bloody or black tarry stools. MAKE SURE YOU:  Understand these instructions.   Will watch your condition.   Will get help right away if you are not doing well or get worse.  Document Released: 11/21/2004 Document Revised: 02/16/2013 Document Reviewed: 10/21/2012 [facility] Patient Information  2015 [place], [place]. This information is not intended to replace advice given to you by your health care provider. Make sure you discuss any questions you have with your health care provider.  Continue to scale back coffee intake Add Zantac or Pepcid for breakthrough GERD symptoms.

## 2014-01-03 NOTE — Progress Notes (Signed)
Pre visit review using our clinic review tool, if applicable. No additional management support is needed unless otherwise documented below in the visit note. 

## 2014-01-03 NOTE — Progress Notes (Signed)
   Subjective:    [name], female    DOB: 10-Jul-1945, 68 y.o.   MRN: [id number]  HPI Patient seen for the following issues:  She has history of GERD and has had some intermittent symptoms recently. She has about 3 times per month where she has some nausea and vomiting with burning substernal discomfort. This is exacerbated by things like wine and coffee. She has recently scaled back her coffee intake somewhat. Her appetite and weight have been stable. She is occasionally had vomiting which is been nonbloody. She has occasional GERD symptoms that wake her up at night. No melena. She takes omeprazole 20 mg daily. Denies dysphagia. No pain with swallowing.  Patient was recently traveling and had onset of some pains that were somewhat poorly localized right mid and lower quadrant. She had fairly severe pain 2 days ago and this is actually better today though not 100% resolved. She then had some pain to touch couple days ago but no rash. No stool changes. No fevers or chills. Denies any right upper quadrant abdominal pain. She has scheduled colonoscopy coming up in a couple weeks. Last colonoscopy about 10 years ago. No history of known diverticular disease.She has had previous abdominal hysterectomy  No past medical history on file. Past Surgical History  Procedure Laterality Date  . Breast enhancement surgery  1978  . Tubal ligation  1972  . Varicose vein surgery  1978  . Abdominal hysterectomy  1993    menorrhagia.    reports that she quit smoking about 23 years ago. Her smoking use included Cigarettes. She has a 20 pack-year smoking history. She does not have any smokeless tobacco history on file. Her alcohol and drug histories are not on file. family history includes Cancer (age of onset: 4) in her father; Heart disease (age of onset: 59) in her mother. No Known Allergies    Review of Systems  Constitutional: Negative for fever, chills, appetite change and unexpected  weight change.  HENT: Negative for trouble swallowing.   Respiratory: Negative for cough and shortness of breath.   Cardiovascular: Negative for chest pain.  Gastrointestinal: Positive for nausea, vomiting and abdominal pain. Negative for diarrhea, constipation and blood in stool.  Genitourinary: Negative for dysuria and hematuria.       Objective:   Physical Exam  Constitutional: She appears well-developed and well-nourished.  HENT:  Mouth/Throat: Oropharynx is clear and moist.  Cardiovascular: Normal rate and regular rhythm.   Pulmonary/Chest: Effort normal and breath sounds normal. No respiratory distress. She has no wheezes. She has no rales.  Abdominal: Soft. Bowel sounds are normal. She exhibits no distension and no mass. There is tenderness. There is no rebound and no guarding.  No upper abdominal tenderness. She has minimal tenderness right lower quadrant but no guarding or rebound. No masses. Pain is minimal          Assessment & Plan:  #1 GERD with breakthrough symptoms. Exacerbated by coffee and wine. She is encouraged to scale both of these back. Add Zantac or Pepcid as needed her omeprazole. Follow-up with gastroenterology if not improving with the above #2 recent right lower quadrant abdominal pain. She does not have any fever or also appetite to suggest likely appendicitis and doubt diverticulitis. Her pain is actually improving. Check urinalysis and CBC. If pain recurs consider CT abdomen and pelvis to further assess. Follow-up promptly for any fever or change of symptoms

## 2014-01-04 LAB — CBC WITH DIFFERENTIAL/PLATELET
BASOS PCT: 1.8 % (ref 0.0–3.0)
Basophils Absolute: 0.1 10*3/uL (ref 0.0–0.1)
EOS ABS: 0.2 10*3/uL (ref 0.0–0.7)
Eosinophils Relative: 2 % (ref 0.0–5.0)
HEMATOCRIT: 38 % (ref 36.0–46.0)
HEMOGLOBIN: 12.3 g/dL (ref 12.0–15.0)
Lymphocytes Relative: 23.2 % (ref 12.0–46.0)
Lymphs Abs: 1.7 10*3/uL (ref 0.7–4.0)
MCHC: 32.4 g/dL (ref 30.0–36.0)
MCV: 91.8 fl (ref 78.0–100.0)
MONOS PCT: 9.7 % (ref 3.0–12.0)
Monocytes Absolute: 0.7 10*3/uL (ref 0.1–1.0)
NEUTROS ABS: 4.7 10*3/uL (ref 1.4–7.7)
Neutrophils Relative %: 63.3 % (ref 43.0–77.0)
Platelets: 323 10*3/uL (ref 150.0–400.0)
RBC: 4.14 Mil/uL (ref 3.87–5.11)
RDW: 14.3 % (ref 11.5–15.5)
WBC: 7.4 10*3/uL (ref 4.0–10.5)

## 2014-01-19 ENCOUNTER — Encounter: Payer: Self-pay | Admitting: Family Medicine

## 2014-02-07 DIAGNOSIS — K635 Polyp of colon: Secondary | ICD-10-CM | POA: Diagnosis not present

## 2014-02-07 DIAGNOSIS — R194 Change in bowel habit: Secondary | ICD-10-CM | POA: Diagnosis not present

## 2014-02-07 DIAGNOSIS — Z1211 Encounter for screening for malignant neoplasm of colon: Secondary | ICD-10-CM | POA: Diagnosis not present

## 2014-02-07 DIAGNOSIS — D123 Benign neoplasm of transverse colon: Secondary | ICD-10-CM | POA: Diagnosis not present

## 2014-03-08 DIAGNOSIS — R110 Nausea: Secondary | ICD-10-CM | POA: Diagnosis not present

## 2014-03-08 DIAGNOSIS — K219 Gastro-esophageal reflux disease without esophagitis: Secondary | ICD-10-CM | POA: Diagnosis not present

## 2014-03-15 DIAGNOSIS — M1711 Unilateral primary osteoarthritis, right knee: Secondary | ICD-10-CM | POA: Diagnosis not present

## 2014-05-16 DIAGNOSIS — L821 Other seborrheic keratosis: Secondary | ICD-10-CM | POA: Diagnosis not present

## 2014-05-16 DIAGNOSIS — L570 Actinic keratosis: Secondary | ICD-10-CM | POA: Diagnosis not present

## 2014-05-16 DIAGNOSIS — Z85828 Personal history of other malignant neoplasm of skin: Secondary | ICD-10-CM | POA: Diagnosis not present

## 2014-05-16 DIAGNOSIS — L738 Other specified follicular disorders: Secondary | ICD-10-CM | POA: Diagnosis not present

## 2014-06-30 DIAGNOSIS — M1712 Unilateral primary osteoarthritis, left knee: Secondary | ICD-10-CM | POA: Diagnosis not present

## 2014-06-30 DIAGNOSIS — M1711 Unilateral primary osteoarthritis, right knee: Secondary | ICD-10-CM | POA: Diagnosis not present

## 2014-07-07 DIAGNOSIS — M1711 Unilateral primary osteoarthritis, right knee: Secondary | ICD-10-CM | POA: Diagnosis not present

## 2014-07-07 DIAGNOSIS — M1712 Unilateral primary osteoarthritis, left knee: Secondary | ICD-10-CM | POA: Diagnosis not present

## 2014-07-14 DIAGNOSIS — M1711 Unilateral primary osteoarthritis, right knee: Secondary | ICD-10-CM | POA: Diagnosis not present

## 2014-07-14 DIAGNOSIS — M1712 Unilateral primary osteoarthritis, left knee: Secondary | ICD-10-CM | POA: Diagnosis not present

## 2014-07-20 ENCOUNTER — Other Ambulatory Visit: Payer: Self-pay | Admitting: Family Medicine

## 2014-07-21 DIAGNOSIS — M1711 Unilateral primary osteoarthritis, right knee: Secondary | ICD-10-CM | POA: Diagnosis not present

## 2014-07-28 DIAGNOSIS — M170 Bilateral primary osteoarthritis of knee: Secondary | ICD-10-CM | POA: Diagnosis not present

## 2014-08-15 ENCOUNTER — Telehealth: Payer: Self-pay | Admitting: Family Medicine

## 2014-08-15 ENCOUNTER — Ambulatory Visit (INDEPENDENT_AMBULATORY_CARE_PROVIDER_SITE_OTHER): Payer: Medicare Other | Admitting: Family Medicine

## 2014-08-15 DIAGNOSIS — Z23 Encounter for immunization: Secondary | ICD-10-CM | POA: Diagnosis not present

## 2014-08-15 NOTE — Telephone Encounter (Signed)
Pt is going to [place]  08-30-14 and may need hep booster. Pt said [facility].

## 2014-08-15 NOTE — Telephone Encounter (Signed)
Pt informed

## 2014-08-15 NOTE — Telephone Encounter (Signed)
OK to give Hep A

## 2014-08-27 DIAGNOSIS — M1711 Unilateral primary osteoarthritis, right knee: Secondary | ICD-10-CM | POA: Diagnosis not present

## 2014-09-20 ENCOUNTER — Telehealth: Payer: Self-pay | Admitting: *Deleted

## 2014-09-20 NOTE — Telephone Encounter (Signed)
Patient came in with complaints of right shoulder pain. Patient states having years of problem with shoulder. States yesterday afternoon she was doing her normal activities and starting having pains in shoulder. Patient states pain so severe causing nausea and feeling she needed to sit down. Patient denies pain radiating, SHOB, fever, chills, chest pain, chest tightness, or chest pressure. Patient also confirms gets relief with ice. MD Burchette aware of complaint and advised OTC Advil 400 mg q6-8hr PRN until her appointment tomorrow to see him. Patient verbalized understanding and is aware to got to UC or ED if symptoms worsen.

## 2014-09-21 ENCOUNTER — Ambulatory Visit (INDEPENDENT_AMBULATORY_CARE_PROVIDER_SITE_OTHER): Payer: Medicare Other | Admitting: Family Medicine

## 2014-09-21 ENCOUNTER — Encounter: Payer: Self-pay | Admitting: Family Medicine

## 2014-09-21 VITALS — BP 118/78 | HR 71 | Temp 98.3°F | Wt 151.0 lb

## 2014-09-21 DIAGNOSIS — M549 Dorsalgia, unspecified: Secondary | ICD-10-CM

## 2014-09-21 DIAGNOSIS — M546 Pain in thoracic spine: Secondary | ICD-10-CM

## 2014-09-21 DIAGNOSIS — M25511 Pain in right shoulder: Secondary | ICD-10-CM

## 2014-09-21 MED ORDER — CYCLOBENZAPRINE HCL 5 MG PO TABS: 5.0000 mg | ORAL_TABLET | Freq: Three times a day (TID) | ORAL | Status: DC | PRN

## 2014-09-21 MED ORDER — METHYLPREDNISOLONE ACETATE 40 MG/ML IJ SUSP
20.0000 mg | Freq: Once | INTRAMUSCULAR | Status: AC
  Administered 2014-09-21: 20 mg via INTRA_ARTICULAR

## 2014-09-21 NOTE — Progress Notes (Signed)
Pre visit review using our clinic review tool, if applicable. No additional management support is needed unless otherwise documented below in the visit note. 

## 2014-09-21 NOTE — Patient Instructions (Signed)
Trigger Point Injection Trigger points are areas where you have muscle pain. A trigger point injection is a shot given in the trigger point to relieve that pain. A trigger point might feel like a knot in your muscle. It hurts to press on a trigger point. Sometimes the pain spreads out (radiates) to other parts of the body. For example, pressing on a trigger point in your shoulder might cause pain in your arm or neck. You might have one trigger point. Or, you might have more than one. People often have trigger points in their upper back and lower back. They also occur often in the neck and shoulders. Pain from a trigger point lasts for a long time. It can make it hard to keep moving. You might not be able to do the exercise or physical therapy that could help you deal with the pain. A trigger point injection may help. It does not work for everyone. But, it may relieve your pain for a few days or a few months. A trigger point injection does not cure long-lasting (chronic) pain. LET YOUR CAREGIVER KNOW ABOUT:  Any allergies (especially to latex, lidocaine, or steroids).  Blood-thinning medicines that you take. These drugs can lead to bleeding or bruising after an injection. They include:  Aspirin.  Ibuprofen.  Clopidogrel.  Warfarin.  Other medicines you take. This includes all vitamins, herbs, eyedrops, over-the-counter medicines, and creams.  Use of steroids.  Recent infections.  Past problems with numbing medicines.  Bleeding problems.  Surgeries you have had.  Other health problems. RISKS AND COMPLICATIONS A trigger point injection is a safe treatment. However, problems may develop, such as:  Minor side effects usually go away in 1 to 2 days. These may include:  Soreness.  Bruising.  Stiffness.  More serious problems are rare. But, they may include:  Bleeding under the skin (hematoma).  Skin infection.  Breaking off of the needle under your skin.  Lung  puncture.  The trigger point injection may not work for you. BEFORE THE PROCEDURE You may need to stop taking any medicine that thins your blood. This is to prevent bleeding and bruising. Usually these medicines are stopped several days before the injection. No other preparation is needed. PROCEDURE  A trigger point injection can be given in your caregiver's office or in a clinic. Each injection takes 2 minutes or less.  Your caregiver will feel for trigger points. The caregiver may use a marker to circle the area for the injection.  The skin over the trigger point will be washed with a germ-killing (antiseptic) solution.  The caregiver pinches the spot for the injection.  Then, a very thin needle is used for the shot. You may feel pain or a twitching feeling when the needle enters the trigger point.  A numbing solution may be injected into the trigger point. Sometimes a drug to keep down swelling, redness, and warmth (inflammation) is also injected.  Your caregiver moves the needle around the trigger zone until the tightness and twitching goes away.  After the injection, your caregiver may put gentle pressure over the injection site.  Then it is covered with a bandage. AFTER THE PROCEDURE  You can go right home after the injection.  The bandage can be taken off after a few hours.  You may feel sore and stiff for 1 to 2 days.  Go back to your regular activities slowly. Your caregiver may ask you to stretch your muscles. Do not do anything that takes   extra energy for a few days.  Follow your caregiver's instructions to manage and treat other pain. Document Released: 01/31/2011 Document Revised: 06/08/2012 Document Reviewed: 01/31/2011 [facility] Patient Information 2015 [place], [place]. This information is not intended to replace advice given to you by your health care provider. Make sure you discuss any questions you have with your health care provider.

## 2014-09-21 NOTE — Progress Notes (Signed)
   Subjective:    [name], female    DOB: January 17, 1946, 69 y.o.   MRN: [id number]  HPI Patient initially came in complaining of right shoulder pain. However, her pain is actually more right upper back scapular region. Very localized pain. Denies any neck pain. She's had some pains off and on for several years. Denies upper extremity numbness or weakness. Pain 7 out of 10 severity occasionally sharp and sometimes dull achy pain. She's tried ice and ibuprofen without relief. Denies any limited range of motion right shoulder or any pain with internal rotation, external rotation, or abduction.  No past medical history on file. Past Surgical History  Procedure Laterality Date  . Breast enhancement surgery  1978  . Tubal ligation  1972  . Varicose vein surgery  1978  . Abdominal hysterectomy  1993    menorrhagia.    reports that she quit smoking about 24 years ago. Her smoking use included Cigarettes. She has a 20 pack-year smoking history. She does not have any smokeless tobacco history on file. Her alcohol and drug histories are not on file. family history includes Cancer (age of onset: 5) in her father; Heart disease (age of onset: 48) in her mother. No Known Allergies    Review of Systems  Constitutional: Negative for appetite change and unexpected weight change.  Cardiovascular: Negative for chest pain.  Neurological: Negative for weakness and numbness.       Objective:   Physical Exam  Constitutional: She appears well-developed and well-nourished.  Cardiovascular: Normal rate, regular rhythm and normal heart sounds.  Exam reveals no gallop.   No murmur heard. Pulmonary/Chest: Effort normal and breath sounds normal. No respiratory distress. She has no wheezes. She has no rales.  Musculoskeletal:  Neck full range of motion. She has full range of motion right shoulder. No localized tenderness over the shoulder joint but she does have some very localized tenderness  over the superior and medial right scapular region. No overlying skin rash.  Neurological:  Full strength upper extremities. Symmetric reflexes. Normal sensory function throughout  Skin: No rash noted.          Assessment & Plan:  Right upper back pain. Suspect trigger point. She may have some secondary muscle spasm. We discussed risk and benefits of trigger point injection and patient consented. Skin prepped with Betadine. Using 25-gauge 5/8 needle injected 1/2 mL Depo-Medrol and 1 mL plain Xylocaine. Pt tolerated well.   Continue ice and anti-inflammatory medicatons as well as muscle massage. Touch base in one week if no improvement We also wrote for low-dose Flexeril 5 mg daily at bedtime to try for any secondary muscle spasm. Touch base one week if no improvement

## 2014-09-29 ENCOUNTER — Ambulatory Visit (INDEPENDENT_AMBULATORY_CARE_PROVIDER_SITE_OTHER): Payer: Medicare Other | Admitting: Family Medicine

## 2014-09-29 ENCOUNTER — Encounter: Payer: Self-pay | Admitting: Family Medicine

## 2014-09-29 VITALS — BP 124/74 | HR 83 | Temp 98.0°F | Wt 150.0 lb

## 2014-09-29 DIAGNOSIS — M549 Dorsalgia, unspecified: Secondary | ICD-10-CM

## 2014-09-29 DIAGNOSIS — M546 Pain in thoracic spine: Secondary | ICD-10-CM

## 2014-09-29 NOTE — Progress Notes (Signed)
   Subjective:    [name], female    DOB: 1945/07/20, 69 y.o.   MRN: [id number]  HPI Patient seen with persistent pain right upper back. She is significantly improved following trigger point injection last week. Refer to prior note. She's had some chronic pains intermittently for quite some time. She is left-hand dominant. No radiculopathy pains. He does not have any pain in the shoulder joint but more periscapular. She tried physical therapy years ago which seemed to help. She is using Flexeril at night which is also helping somewhat. She's not had any cough or shortness of breath or appetite or weight changes to suggest pulmonary etiology. Her pain is consistently worse with movement. She has tried to reduce computer time to one hour per day. She is doing some swimming exercises but has been limited and weight lifting because of her pain  She had recent massage which helped only minimally  No past medical history on file. Past Surgical History  Procedure Laterality Date  . Breast enhancement surgery  1978  . Tubal ligation  1972  . Varicose vein surgery  1978  . Abdominal hysterectomy  1993    menorrhagia.    reports that she quit smoking about 24 years ago. Her smoking use included Cigarettes. She has a 20 pack-year smoking history. She does not have any smokeless tobacco history on file. Her alcohol and drug histories are not on file. family history includes Cancer (age of onset: 90) in her father; Heart disease (age of onset: 76) in her mother. No Known Allergies    Review of Systems  Constitutional: Negative for fever, appetite change and unexpected weight change.  Respiratory: Negative for shortness of breath.   Cardiovascular: Negative for chest pain.       Objective:   Physical Exam  Constitutional: She appears well-developed and well-nourished.  Cardiovascular: Normal rate and regular rhythm.   Pulmonary/Chest: Effort normal and breath sounds normal.  No respiratory distress. She has no wheezes. She has no rales.  Musculoskeletal:  Full range of motion right shoulder. No localized tenderness over the shoulder joint. She has some mild right trapezius tenderness and much less tender to palpation around the right scapula following recent injection  Neurological:  Full strength upper extremities          Assessment & Plan:  Right upper back pain. Suspect muscular. Improved following steroid injection but not totally relieved. Set up physical therapy with integrative therapies. Continue muscle relaxer as needed at night

## 2014-09-29 NOTE — Patient Instructions (Signed)
We will call you with physical therapy referral.

## 2014-09-29 NOTE — Progress Notes (Signed)
Pre visit review using our clinic review tool, if applicable. No additional management support is needed unless otherwise documented below in the visit note. 

## 2014-11-18 DIAGNOSIS — Z85828 Personal history of other malignant neoplasm of skin: Secondary | ICD-10-CM | POA: Diagnosis not present

## 2014-11-18 DIAGNOSIS — L821 Other seborrheic keratosis: Secondary | ICD-10-CM | POA: Diagnosis not present

## 2014-11-18 DIAGNOSIS — L298 Other pruritus: Secondary | ICD-10-CM | POA: Diagnosis not present

## 2014-11-18 DIAGNOSIS — L814 Other melanin hyperpigmentation: Secondary | ICD-10-CM | POA: Diagnosis not present

## 2014-11-18 DIAGNOSIS — L245 Irritant contact dermatitis due to other chemical products: Secondary | ICD-10-CM | POA: Diagnosis not present

## 2014-11-18 DIAGNOSIS — L820 Inflamed seborrheic keratosis: Secondary | ICD-10-CM | POA: Diagnosis not present

## 2015-01-12 DIAGNOSIS — Z1231 Encounter for screening mammogram for malignant neoplasm of breast: Secondary | ICD-10-CM | POA: Diagnosis not present

## 2015-01-12 LAB — HM MAMMOGRAPHY

## 2015-01-16 ENCOUNTER — Ambulatory Visit (INDEPENDENT_AMBULATORY_CARE_PROVIDER_SITE_OTHER): Payer: Medicare Other | Admitting: Family Medicine

## 2015-01-16 DIAGNOSIS — Z23 Encounter for immunization: Secondary | ICD-10-CM | POA: Diagnosis not present

## 2015-01-17 ENCOUNTER — Encounter: Payer: Self-pay | Admitting: Family Medicine

## 2015-02-28 DIAGNOSIS — H5213 Myopia, bilateral: Secondary | ICD-10-CM | POA: Diagnosis not present

## 2015-02-28 DIAGNOSIS — H353111 Nonexudative age-related macular degeneration, right eye, early dry stage: Secondary | ICD-10-CM | POA: Diagnosis not present

## 2015-02-28 DIAGNOSIS — H2513 Age-related nuclear cataract, bilateral: Secondary | ICD-10-CM | POA: Diagnosis not present

## 2015-03-09 DIAGNOSIS — I87323 Chronic venous hypertension (idiopathic) with inflammation of bilateral lower extremity: Secondary | ICD-10-CM | POA: Diagnosis not present

## 2015-03-30 ENCOUNTER — Encounter: Payer: Self-pay | Admitting: Family Medicine

## 2015-03-30 ENCOUNTER — Ambulatory Visit (INDEPENDENT_AMBULATORY_CARE_PROVIDER_SITE_OTHER): Payer: Medicare Other | Admitting: Family Medicine

## 2015-03-30 VITALS — BP 110/80 | HR 82 | Temp 98.2°F | Ht 67.5 in | Wt 151.6 lb

## 2015-03-30 DIAGNOSIS — R3915 Urgency of urination: Secondary | ICD-10-CM

## 2015-03-30 DIAGNOSIS — Z0001 Encounter for general adult medical examination with abnormal findings: Secondary | ICD-10-CM

## 2015-03-30 DIAGNOSIS — Z0000 Encounter for general adult medical examination without abnormal findings: Secondary | ICD-10-CM

## 2015-03-30 DIAGNOSIS — Z780 Asymptomatic menopausal state: Secondary | ICD-10-CM

## 2015-03-30 DIAGNOSIS — Z23 Encounter for immunization: Secondary | ICD-10-CM | POA: Diagnosis not present

## 2015-03-30 DIAGNOSIS — H6121 Impacted cerumen, right ear: Secondary | ICD-10-CM | POA: Diagnosis not present

## 2015-03-30 NOTE — Addendum Note (Signed)
Addended by: Elio Forget on: 03/30/2015 11:25 AM   Modules accepted: Orders

## 2015-03-30 NOTE — Progress Notes (Signed)
Pre visit review using our clinic review tool, if applicable. No additional management support is needed unless otherwise documented below in the visit note. 

## 2015-03-30 NOTE — Progress Notes (Signed)
Subjective:    [name], female    DOB: Jan 24, 1946, 70 y.o.   MRN: [id number]  HPI  patient for Medicare wellness exam. She needs Pneumovax. She has not had DEXA scan in several years. She had recent mammogram and colonoscopy. She denies any recent falls   Recently had some right ear fullness. History of cerumen impactions in the past. Hearing slightly declined in the right ear.  No dizziness.  She's had some chronic urinary urgency. We tried Myrbetriq and also Vesicare but she did not see improvement with either one. She is not interested in medication options at this time. Denies any consistent stress incontinence   No past medical history on file. Past Surgical History  Procedure Laterality Date  . Breast enhancement surgery  1978  . Tubal ligation  1972  . Varicose vein surgery  1978  . Abdominal hysterectomy  1993    menorrhagia.    reports that she quit smoking about 25 years ago. Her smoking use included Cigarettes. She has a 20 pack-year smoking history. She does not have any smokeless tobacco history on file. Her alcohol and drug histories are not on file. family history includes Cancer (age of onset: 31) in her father; Heart disease (age of onset: 1) in her mother. No Known Allergies]  1.  Risk factors based on Past Medical , Social, and Family history reviewed and as indicated above with no changes 2.  Limitations in physical activities None.  No recent falls. 3.  Depression/mood No active depression or anxiety issues 4.  Hearing No defiits 5.  ADLs independent in all. 6.  Cognitive function (orientation to time and place, language, writing, speech,memory) no short or long term memory issues.  Language and judgement intact. 7.  Home Safety no issues 8.  Height, weight, and visual acuity.all stable. 9.  Counseling discussed  regular weightbearing exercise along with appropriate amounts of daily calcium and vitamin D  10. Recommendation of preventive  services. yearly flu vaccine. She will continue with regular mammograms. Recent colonoscopy with recommended five-year follow-up 11. Labs based on risk factors-None  12. Care Plan as above  13. Other Providers Dr Hoyt Koch  Dr Holley Raring 14. Written schedule of screening/prevention services given to patient.    Review of Systems  Constitutional: Negative for fever, activity change, appetite change, fatigue and unexpected weight change.  HENT: Negative for ear pain, hearing loss, sore throat and trouble swallowing.   Eyes: Negative for visual disturbance.  Respiratory: Negative for cough and shortness of breath.   Cardiovascular: Negative for chest pain and palpitations.  Gastrointestinal: Negative for abdominal pain, diarrhea, constipation and blood in stool.  Genitourinary: Negative for dysuria and hematuria.  Musculoskeletal: Negative for myalgias, back pain and arthralgias.  Skin: Negative for rash.  Neurological: Negative for dizziness, syncope and headaches.  Hematological: Negative for adenopathy.  Psychiatric/Behavioral: Negative for confusion and dysphoric mood.       Objective:   Physical Exam  Constitutional: She is oriented to person, place, and time. She appears well-developed and well-nourished.  HENT:  Head: Normocephalic and atraumatic.  Eyes: EOM are normal. Pupils are equal, round, and reactive to light.  Neck: Normal range of motion. Neck supple. No thyromegaly present.  Cardiovascular: Normal rate, regular rhythm and normal heart sounds.   No murmur heard. Pulmonary/Chest: Breath sounds normal. No respiratory distress. She has no wheezes. She has no rales.  Abdominal: Soft. Bowel sounds are normal. She exhibits no distension and no mass.  There is no tenderness. There is no rebound and no guarding.  Musculoskeletal: Normal range of motion. She exhibits no edema.  Lymphadenopathy:    She has no cervical adenopathy.  Neurological: She is alert and oriented to  person, place, and time. She displays normal reflexes. No cranial nerve deficit.  Skin: No rash noted.  Psychiatric: She has a normal mood and affect. Her behavior is normal. Judgment and thought content normal.          Assessment & Plan:   Medicare wellness exam. Pneumovax recommended. Schedule bone density scan. Continue regular calcium and vitamin D intake.   Continue with regular weight bearing exercise.   Cerumen impaction right ear. Irrigation with good success. Marland Kitchen Hearing improved afterwards.   Urinary urgency.  Symptoms are relatively mild. She has not seen improvement with medications previously as above. She wishes to observe at this time.

## 2015-03-30 NOTE — Patient Instructions (Signed)
Health Maintenance  Topic Date Due  . Hepatitis C Screening  [birth year]  . DEXA SCAN  04/09/2010  . PNA vac Low Risk Adult (2 of 2 - PPSV23) 01/19/2014  . TETANUS/TDAP  02/25/2014  . INFLUENZA VACCINE  09/26/2015  . MAMMOGRAM  01/11/2017  . COLONOSCOPY  02/08/2024  . ZOSTAVAX  Completed     schedule bone density scan  Continue with yearly flu vaccine.  Continue with daily calcium and vitamin D

## 2015-04-04 ENCOUNTER — Telehealth: Payer: Self-pay | Admitting: Family Medicine

## 2015-04-04 NOTE — Telephone Encounter (Signed)
[facility] [place] Patient Name: Mia Peterson DOB: Jul 10, 1945 Initial Comment Caller states she had pneumonia shot in arm 03/30/2015, the arm was paralyzed for the next 48 hours, still sore now Nurse Assessment Nurse: Markus Daft, RN, Sherre Poot Date/Time (Eastern Time): 04/04/2015 8:39:19 AM Confirm and document reason for call. If symptomatic, describe symptoms. You must click the next button to save text entered. ---Caller states she had pneumonia shot in arm 03/30/2015, the arm was paralyzed for the next 48 hours, still sore now. Denies numbness now. Rates pain now at 2-3/10. No redness or warmth. No fever. Has the patient traveled out of the country within the last 30 days? ---Not Applicable Does the patient have any new or worsening symptoms? ---Yes Will a triage be completed? ---Yes Related visit to physician within the last 2 weeks? ---Yes Does the PT have any chronic conditions? (i.e. diabetes, asthma, etc.) ---No Is this a behavioral health or substance abuse call? ---No Guidelines Guideline Title Affirmed Question Affirmed Notes Immunization Reactions [1] Pain, tenderness, or swelling at the injection site AND [2] persists > 3 days Final Disposition User See PCP When Office is Open (within 3 days) Markus Daft, Therapist, sports, Sherre Poot Comments She could move her fingers while this happened, but arm was too weak to move - unable. Also had diarrhea for several days after. No diarrhea since yesterday. Appt made with Dr. Elease Hashimoto for tomorrow, 04/05/15, at 1:30 pm Referrals REFERRED TO PCP OFFICE Disagree/Comply: Comply

## 2015-04-05 ENCOUNTER — Telehealth: Payer: Self-pay | Admitting: Family Medicine

## 2015-04-05 ENCOUNTER — Ambulatory Visit (INDEPENDENT_AMBULATORY_CARE_PROVIDER_SITE_OTHER): Payer: Medicare Other | Admitting: Family Medicine

## 2015-04-05 VITALS — BP 130/80 | HR 95 | Temp 98.1°F

## 2015-04-05 DIAGNOSIS — R197 Diarrhea, unspecified: Secondary | ICD-10-CM | POA: Diagnosis not present

## 2015-04-05 DIAGNOSIS — R29898 Other symptoms and signs involving the musculoskeletal system: Secondary | ICD-10-CM | POA: Diagnosis not present

## 2015-04-05 NOTE — Patient Instructions (Signed)
Follow up immediately for any recurrent arm weakness or pain.

## 2015-04-05 NOTE — Progress Notes (Signed)
Subjective:    [name], female    DOB: 09/14/1945, 70 y.o.   MRN: [id number]  HPI   Patient seen for possible adverse reaction from vaccine. She was seen last week and received Pneumovax. She states by later that day she noticed some mild paresthesias in the right upper extremity followed by some weakness. She states she could barely move her arm by the next day. She did not contact us at that point. She denied any redness or swelling at the injection site. She also noticed some nausea without vomiting and mild nonspecific dizziness. Her weakness gradually improved over the next couple of days. She now has full range of motion and full strength. She reported only minimal soreness around the injection site.   She denied any cervical neck pains or cervical radiculopathy type symptoms. No lower extremity weakness. No slurred speech. No confusion.   She also developed some mild diarrhea- currently about 4 stools per day which are loose and nonbloody. No associated fevers or chills. No recent travels. No recent antibiotics. No abdominal pain. No associated nausea or vomiting  No past medical history on file. Past Surgical History  Procedure Laterality Date  . Breast enhancement surgery  1978  . Tubal ligation  1972  . Varicose vein surgery  1978  . Abdominal hysterectomy  1993    menorrhagia.    reports that she quit smoking about 25 years ago. Her smoking use included Cigarettes. She has a 20 pack-year smoking history. She does not have any smokeless tobacco history on file. Her alcohol and drug histories are not on file. family history includes Cancer (age of onset: 38) in her father; Heart disease (age of onset: 15) in her mother. No Known Allergies    Review of Systems  Constitutional: Negative for fatigue.  Eyes: Negative for visual disturbance.  Respiratory: Negative for cough, chest tightness, shortness of breath and wheezing.   Cardiovascular: Negative for  chest pain, palpitations and leg swelling.  Gastrointestinal: Positive for diarrhea. Negative for nausea, vomiting, abdominal pain and blood in stool.  Genitourinary: Negative for dysuria.  Neurological: Negative for dizziness, seizures, syncope, weakness, light-headedness and headaches.  Psychiatric/Behavioral: Negative for confusion.       Objective:   Physical Exam  Constitutional: She is oriented to person, place, and time. She appears well-developed and well-nourished.  Cardiovascular: Normal rate and regular rhythm.   Pulmonary/Chest: Effort normal and breath sounds normal. No respiratory distress. She has no wheezes. She has no rales.  Neurological: She is alert and oriented to person, place, and time. No cranial nerve deficit.  Full strength right upper extremity with symmetric reflexes.  Skin: No rash noted.  Right deltoid region is examined. She has a very small area of ecchymosis about 4-5 mm but no induration and no erythema or visible swelling or warmth. Minimally tender          Assessment & Plan:  #1 possible adverse reaction from Pneumovax. Patient described right upper extremity weakness but no evidence for local reactions such as erythema or swelling. Her symptoms are fully resolved at this point. She is not a candidate for any further pneumonia vaccinations. Report to [facility]. She did not describe any other symptoms to suggest TIA or stroke  #2 diarrhea. We this is not likely related to her vaccine. She's not any red flags such as bloody stools, fever, abdominal pain. She's not any recent travels or antibiotics. We've recommended trial of Imodium and discussed dietary  factors. Consider stool studies if not improved by next week

## 2015-04-05 NOTE — Telephone Encounter (Signed)
Opened in error

## 2015-04-07 ENCOUNTER — Ambulatory Visit (INDEPENDENT_AMBULATORY_CARE_PROVIDER_SITE_OTHER)
Admission: RE | Admit: 2015-04-07 | Discharge: 2015-04-07 | Disposition: A | Discharge status: Home / Self Care | Payer: Medicare Other | Source: Ambulatory Visit | Attending: Family Medicine | Admitting: Family Medicine

## 2015-04-07 DIAGNOSIS — Z780 Asymptomatic menopausal state: Secondary | ICD-10-CM

## 2015-04-13 ENCOUNTER — Ambulatory Visit (INDEPENDENT_AMBULATORY_CARE_PROVIDER_SITE_OTHER): Payer: Medicare Other | Admitting: Family Medicine

## 2015-04-13 VITALS — BP 110/82 | HR 88 | Temp 97.9°F | Ht 67.5 in | Wt 156.0 lb

## 2015-04-13 DIAGNOSIS — H8110 Benign paroxysmal vertigo, unspecified ear: Secondary | ICD-10-CM

## 2015-04-13 MED ORDER — MECLIZINE HCL 12.5 MG PO TABS: 12.5000 mg | ORAL_TABLET | Freq: Three times a day (TID) | ORAL | Status: DC | PRN

## 2015-04-13 NOTE — Progress Notes (Signed)
   Subjective:    [name], female    DOB: 05-29-1945, 70 y.o.   MRN: [id number]  HPI  patient seen with new problem of vertigo.  She first noted this last night when she rolled over in bed. Symptoms were severe again this morning when she first got up but have gradually improved through the day  she has not noted if symptoms are worse with either side but possibly to the left.  She noted some mild nausea but no vomiting. No headaches.  No associated hearing loss , speech change , focal weakness , dysphagia   Mild similar episode several years ago.  Symptoms are worse with movement and change of position.  No alleviating factors.  No past medical history on file. Past Surgical History  Procedure Laterality Date  . Breast enhancement surgery  1978  . Tubal ligation  1972  . Varicose vein surgery  1978  . Abdominal hysterectomy  1993    menorrhagia.    reports that she quit smoking about 25 years ago. Her smoking use included Cigarettes. She has a 20 pack-year smoking history. She does not have any smokeless tobacco history on file. Her alcohol and drug histories are not on file. family history includes Cancer (age of onset: 49) in her father; Heart disease (age of onset: 79) in her mother. No Known Allergies    Review of Systems  Constitutional: Negative for fever and chills.  HENT: Negative for trouble swallowing.   Respiratory: Negative for shortness of breath.   Cardiovascular: Negative for chest pain.  Gastrointestinal: Positive for nausea. Negative for vomiting.  Neurological: Positive for dizziness. Negative for seizures, syncope, weakness and headaches.  Psychiatric/Behavioral: Negative for confusion.       Objective:   Physical Exam  Constitutional: She is oriented to person, place, and time. She appears well-developed and well-nourished.  Neck: Neck supple. No thyromegaly present.  Cardiovascular: Normal rate and regular rhythm.  Exam reveals no  gallop.   Pulmonary/Chest: Effort normal and breath sounds normal. No respiratory distress. She has no wheezes. She has no rales.  Neurological: She is alert and oriented to person, place, and time. No cranial nerve deficit.  Gait is normal. Cerebellar function normal. No focal weakness. Vertigo was triggered with patient lying supine with head both turned to the right and left side. No nystagmus noted.  Psychiatric: She has a normal mood and affect. Her behavior is normal. Judgment and thought content normal.          Assessment & Plan:  Vertigo. Suspect benign peripheral positional vertigo. Wrote for limited meclizine 12.5 mg every 8 hours as needed. Follow-up immediately for any worsening symptoms or change of symptoms. Consider vestibular rehabilitation if symptoms persist into next week and avoid driving until symptoms clear

## 2015-04-13 NOTE — Progress Notes (Signed)
Pre visit review using our clinic review tool, if applicable. No additional management support is needed unless otherwise documented below in the visit note. 

## 2015-04-13 NOTE — Patient Instructions (Addendum)
Benign Positional Vertigo Vertigo is the feeling that you or your surroundings are moving when they are not. Benign positional vertigo is the most common form of vertigo. The cause of this condition is not serious (is benign). This condition is triggered by certain movements and positions (is positional). This condition can be dangerous if it occurs while you are doing something that could endanger you or others, such as driving.  CAUSES In many cases, the cause of this condition is not known. It may be caused by a disturbance in an area of the inner ear that helps your brain to sense movement and balance. This disturbance can be caused by a viral infection (labyrinthitis), head injury, or repetitive motion. RISK FACTORS This condition is more likely to develop in:  Women.  People who are 50 years of age or older. SYMPTOMS Symptoms of this condition usually happen when you move your head or your eyes in different directions. Symptoms may start suddenly, and they usually last for less than a minute. Symptoms may include:  Loss of balance and falling.  Feeling like you are spinning or moving.  Feeling like your surroundings are spinning or moving.  Nausea and vomiting.  Blurred vision.  Dizziness.  Involuntary eye movement (nystagmus). Symptoms can be mild and cause only slight annoyance, or they can be severe and interfere with daily life. Episodes of benign positional vertigo may return (recur) over time, and they may be triggered by certain movements. Symptoms may improve over time. DIAGNOSIS This condition is usually diagnosed by medical history and a physical exam of the head, neck, and ears. You may be referred to a health care provider who specializes in ear, nose, and throat (ENT) problems (otolaryngologist) or a provider who specializes in disorders of the nervous system (neurologist). You may have additional testing, including:  MRI.  A CT scan.  Eye movement tests. Your  health care provider may ask you to change positions quickly while he or she watches you for symptoms of benign positional vertigo, such as nystagmus. Eye movement may be tested with an electronystagmogram (ENG), caloric stimulation, the Dix-Hallpike test, or the roll test.  An electroencephalogram (EEG). This records electrical activity in your brain.  Hearing tests. TREATMENT Usually, your health care provider will treat this by moving your head in specific positions to adjust your inner ear back to normal. Surgery may be needed in severe cases, but this is rare. In some cases, benign positional vertigo may resolve on its own in 2-4 weeks. HOME CARE INSTRUCTIONS Safety  Move slowly.Avoid sudden body or head movements.  Avoid driving.  Avoid operating heavy machinery.  Avoid doing any tasks that would be dangerous to you or others if a vertigo episode would occur.  If you have trouble walking or keeping your balance, try using a cane for stability. If you feel dizzy or unstable, sit down right away.  Return to your normal activities as told by your health care provider. Ask your health care provider what activities are safe for you. General Instructions  Take over-the-counter and prescription medicines only as told by your health care provider.  Avoid certain positions or movements as told by your health care provider.  Drink enough fluid to keep your urine clear or pale yellow.  Keep all follow-up visits as told by your health care provider. This is important. SEEK MEDICAL CARE IF:  You have a fever.  Your condition gets worse or you develop new symptoms.  Your family or friends   notice any behavioral changes.  Your nausea or vomiting gets worse.  You have numbness or a "pins and needles" sensation. SEEK IMMEDIATE MEDICAL CARE IF:  You have difficulty speaking or moving.  You are always dizzy.  You faint.  You develop severe headaches.  You have weakness in your  legs or arms.  You have changes in your hearing or vision.  You develop a stiff neck.  You develop sensitivity to light.   This information is not intended to replace advice given to you by your health care provider. Make sure you discuss any questions you have with your health care provider.   Document Released: 11/19/2005 Document Revised: 11/02/2014 Document Reviewed: 06/06/2014 Elsevier Interactive Patient Education 2016 [place] [employer] by next week if symptoms not resolving.

## 2015-04-20 DIAGNOSIS — M1712 Unilateral primary osteoarthritis, left knee: Secondary | ICD-10-CM | POA: Diagnosis not present

## 2015-04-20 DIAGNOSIS — M1711 Unilateral primary osteoarthritis, right knee: Secondary | ICD-10-CM | POA: Diagnosis not present

## 2015-04-27 DIAGNOSIS — M1711 Unilateral primary osteoarthritis, right knee: Secondary | ICD-10-CM | POA: Diagnosis not present

## 2015-04-27 DIAGNOSIS — M1712 Unilateral primary osteoarthritis, left knee: Secondary | ICD-10-CM | POA: Diagnosis not present

## 2015-05-04 DIAGNOSIS — M1712 Unilateral primary osteoarthritis, left knee: Secondary | ICD-10-CM | POA: Diagnosis not present

## 2015-05-04 DIAGNOSIS — M1711 Unilateral primary osteoarthritis, right knee: Secondary | ICD-10-CM | POA: Diagnosis not present

## 2015-05-05 ENCOUNTER — Ambulatory Visit (INDEPENDENT_AMBULATORY_CARE_PROVIDER_SITE_OTHER): Payer: Medicare Other | Admitting: Family Medicine

## 2015-05-05 VITALS — BP 128/82 | HR 77 | Temp 97.4°F | Ht 67.5 in | Wt 154.5 lb

## 2015-05-05 DIAGNOSIS — R42 Dizziness and giddiness: Secondary | ICD-10-CM

## 2015-05-05 DIAGNOSIS — R5383 Other fatigue: Secondary | ICD-10-CM | POA: Diagnosis not present

## 2015-05-05 DIAGNOSIS — E785 Hyperlipidemia, unspecified: Secondary | ICD-10-CM | POA: Diagnosis not present

## 2015-05-05 LAB — BASIC METABOLIC PANEL
BUN: 17 mg/dL (ref 6–23)
CALCIUM: 9.8 mg/dL (ref 8.4–10.5)
CO2: 29 mEq/L (ref 19–32)
CREATININE: 0.7 mg/dL (ref 0.40–1.20)
Chloride: 100 mEq/L (ref 96–112)
GFR: 87.91 mL/min (ref >60.00)
Glucose, Bld: 88 mg/dL (ref 70–99)
Potassium: 4.3 mEq/L (ref 3.5–5.1)
Sodium: 139 mEq/L (ref 135–145)

## 2015-05-05 LAB — CBC WITH DIFFERENTIAL/PLATELET
BASOS ABS: 0 10*3/uL (ref 0.0–0.1)
Basophils Relative: 0.6 % (ref 0.0–3.0)
EOS ABS: 0.1 10*3/uL (ref 0.0–0.7)
Eosinophils Relative: 2.1 % (ref 0.0–5.0)
HEMATOCRIT: 41.6 % (ref 36.0–46.0)
HEMOGLOBIN: 14.1 g/dL (ref 12.0–15.0)
LYMPHS PCT: 29.1 % (ref 12.0–46.0)
Lymphs Abs: 1.6 10*3/uL (ref 0.7–4.0)
MCHC: 33.8 g/dL (ref 30.0–36.0)
MCV: 87 fl (ref 78.0–100.0)
Monocytes Absolute: 0.4 10*3/uL (ref 0.1–1.0)
Monocytes Relative: 7.9 % (ref 3.0–12.0)
NEUTROS ABS: 3.4 10*3/uL (ref 1.4–7.7)
Neutrophils Relative %: 60.3 % (ref 43.0–77.0)
PLATELETS: 246 10*3/uL (ref 150.0–400.0)
RBC: 4.78 Mil/uL (ref 3.87–5.11)
RDW: 13.7 % (ref 11.5–15.5)
WBC: 5.6 10*3/uL (ref 4.0–10.5)

## 2015-05-05 LAB — HEPATIC FUNCTION PANEL
ALT: 17 U/L (ref 0–35)
AST: 24 U/L (ref 0–37)
Albumin: 4.1 g/dL (ref 3.5–5.2)
Alkaline Phosphatase: 69 U/L (ref 39–117)
BILIRUBIN DIRECT: 0.1 mg/dL (ref 0.0–0.3)
BILIRUBIN TOTAL: 0.6 mg/dL (ref 0.2–1.2)
Total Protein: 6.5 g/dL (ref 6.0–8.3)

## 2015-05-05 LAB — TSH: TSH: 1.58 u[IU]/mL (ref 0.35–4.50)

## 2015-05-05 LAB — LIPID PANEL
CHOLESTEROL: 209 mg/dL — AB (ref 0–200)
HDL: 76.4 mg/dL (ref >39.00)
LDL CALC: 120 mg/dL — AB (ref 0–99)
NONHDL: 132.88
TRIGLYCERIDES: 64 mg/dL (ref 0.0–149.0)
Total CHOL/HDL Ratio: 3
VLDL: 12.8 mg/dL (ref 0.0–40.0)

## 2015-05-05 NOTE — Progress Notes (Signed)
   Subjective:    [name], female    DOB: 18-Mar-1945, 70 y.o.   MRN: [id number]  HPI Patient seen with persistent vertigo symptoms. Symptoms somewhat intermittent but most days over the past month. No clear directional component. No ataxia. No focal weakness. No speech changes. No swallowing difficulties. Denies any visual changes. No hearing loss. No ringing. No clear exacerbating factors other than movement. Symptoms tend to be worse early in the morning. No associated headaches.  History of mild hyperlipidemia but excellent HDL. Overall low risk for CAD. Patient requesting follow-up lipid panel. She has some general fatigue but this has been somewhat chronic. No recent lab work.  No past medical history on file. Past Surgical History  Procedure Laterality Date  . Breast enhancement surgery  1978  . Tubal ligation  1972  . Varicose vein surgery  1978  . Abdominal hysterectomy  1993    menorrhagia.    reports that she quit smoking about 25 years ago. Her smoking use included Cigarettes. She has a 20 pack-year smoking history. She does not have any smokeless tobacco history on file. Her alcohol and drug histories are not on file. family history includes Cancer (age of onset: 54) in her father; Heart disease (age of onset: 51) in her mother. No Known Allergies    Review of Systems  Constitutional: Positive for fatigue.  Eyes: Negative for visual disturbance.  Respiratory: Negative for cough, chest tightness, shortness of breath and wheezing.   Cardiovascular: Negative for chest pain, palpitations and leg swelling.  Gastrointestinal: Negative for abdominal pain.  Genitourinary: Negative for dysuria.  Neurological: Positive for dizziness. Negative for seizures, syncope, weakness, light-headedness and headaches.  Psychiatric/Behavioral: Negative for confusion.       Objective:   Physical Exam  Constitutional: She appears well-developed and well-nourished.    Eyes: Pupils are equal, round, and reactive to light.  Neck: Neck supple. No JVD present. No thyromegaly present.  Cardiovascular: Normal rate and regular rhythm.  Exam reveals no gallop.   Pulmonary/Chest: Effort normal and breath sounds normal. No respiratory distress. She has no wheezes. She has no rales.  Musculoskeletal: She exhibits no edema.  No nystagmus. No focal weakness. Cerebellar function normal. Gait normal.  Neurological: She is alert.          Assessment & Plan:  #1 persistent vertigo. Nonfocal exam neurologically. Given duration of symptoms set up vestibular rehabilitation. She'll be in touch if not resolving with that  #2 history of hyperlipidemia. Check fasting lipid panel per patient request. Overall low risk for CAD  #3 history of fatigue. Check labs including CBC, chemistries, TSH

## 2015-05-05 NOTE — Patient Instructions (Signed)

## 2015-05-05 NOTE — Progress Notes (Signed)
Pre visit review using our clinic review tool, if applicable. No additional management support is needed unless otherwise documented below in the visit note. 

## 2015-05-09 ENCOUNTER — Ambulatory Visit: Payer: Medicare Other | Attending: Family Medicine

## 2015-05-09 DIAGNOSIS — R42 Dizziness and giddiness: Secondary | ICD-10-CM | POA: Diagnosis not present

## 2015-05-09 DIAGNOSIS — R269 Unspecified abnormalities of gait and mobility: Secondary | ICD-10-CM | POA: Diagnosis not present

## 2015-05-09 NOTE — Therapy (Signed)
[facility] [address] [place], [place], [address] Phone: [phone]   Fax:  [phone]  Physical Therapy Evaluation  Patient Details  Name: Mia Peterson MRN: [id number] Date of Birth: 10/05/1945 Referring Provider: Dr. Elease Hashimoto  Encounter Date: 05/09/2015      PT End of Session - 05/09/15 1050    Visit Number 1   Number of Visits 9   Date for PT Re-Evaluation 06/08/15   Authorization Type G-code and progress note every 10th visit.   PT Start Time [phone]   PT Stop Time 0930   PT Time Calculation (min) 43 min   Activity Tolerance Patient tolerated treatment well   Behavior During Therapy [facility] for tasks assessed/performed      History reviewed. No pertinent past medical history.  Past Surgical History  Procedure Laterality Date  . Breast enhancement surgery  1978  . Tubal ligation  1972  . Varicose vein surgery  1978  . Abdominal hysterectomy  1993    menorrhagia.    There were no vitals filed for this visit.  Visit Diagnosis:  Dizziness and giddiness - Plan: PT plan of care cert/re-cert  Abnormality of gait - Plan: PT plan of care cert/re-cert      Subjective Assessment - 05/09/15 0853    Subjective Pt reported dizziness began 04/12/15, and denied any trauma (falls, MVA, etc.). Pt stated it occurred the morning prior to when a friend was going to take her horseback riding for her birthday. Pt was unable to go 2/2 dizziness. Pt reported dizziness onset was very sudden and occurred in the morning. Pt describes dizziness as a spinning sensation, and she's unable to maintain balance. Pt reports dizziness duration lasted all day the first day, it is now intermittent. It is worse in the morning, and has to be careful when walking to the bathroom. Pt reports turning head quickly side to side increases dizziness, along with looking up. Pt reports looking down and moving slowly decreases dizziness. Dizziness at  worst:  8/10 and at best: 1/10 and current: 3/10. Pt particpates deep water aerobics and exercise classes but has reduced the frequency. Pt reports she tends to be a sleep person normally but is more fatigued since the onset of dizziness. At end of session pt also reported head congestion and fullness in ears began around the same time as dizziness. Pt reported meclizine did not help dizziness.   Pertinent History Hx of skin CA, depression   Patient Stated Goals I would like for this to go away and go back to my normal state.    Currently in Pain? No/denies     Family care resident, Dr. Francesca Oman, present during session.       [facility] PT Assessment - 05/09/15 0001    Assessment   Medical Diagnosis Vertigo   Referring Provider Dr. Elease Hashimoto   Onset Date/Surgical Date 04/12/15   Prior Therapy none for vertigo   Precautions   Precautions None   Restrictions   Weight Bearing Restrictions No   Balance Screen   Has the patient fallen in the past 6 months No   Has the patient had a decrease in activity level because of a fear of falling?  Yes   Is the patient reluctant to leave their home because of a fear of falling?  No   Home Environment   Living Environment Private residence   Living Arrangements Spouse/significant other   Available Help at Discharge Family   Type  [facility] [address] [place], [place], [address] Phone: [phone]   Fax:  [phone]  Physical Therapy Evaluation  Patient Details  Name: Mia Peterson MRN: [id number] Date of Birth: 10/05/1945 Referring Provider: Dr. Elease Hashimoto  Encounter Date: 05/09/2015      PT End of Session - 05/09/15 1050    Visit Number 1   Number of Visits 9   Date for PT Re-Evaluation 06/08/15   Authorization Type G-code and progress note every 10th visit.   PT Start Time [phone]   PT Stop Time 0930   PT Time Calculation (min) 43 min   Activity Tolerance Patient tolerated treatment well   Behavior During Therapy [facility] for tasks assessed/performed      History reviewed. No pertinent past medical history.  Past Surgical History  Procedure Laterality Date  . Breast enhancement surgery  1978  . Tubal ligation  1972  . Varicose vein surgery  1978  . Abdominal hysterectomy  1993    menorrhagia.    There were no vitals filed for this visit.  Visit Diagnosis:  Dizziness and giddiness - Plan: PT plan of care cert/re-cert  Abnormality of gait - Plan: PT plan of care cert/re-cert      Subjective Assessment - 05/09/15 0853    Subjective Pt reported dizziness began 04/12/15, and denied any trauma (falls, MVA, etc.). Pt stated it occurred the morning prior to when a friend was going to take her horseback riding for her birthday. Pt was unable to go 2/2 dizziness. Pt reported dizziness onset was very sudden and occurred in the morning. Pt describes dizziness as a spinning sensation, and she's unable to maintain balance. Pt reports dizziness duration lasted all day the first day, it is now intermittent. It is worse in the morning, and has to be careful when walking to the bathroom. Pt reports turning head quickly side to side increases dizziness, along with looking up. Pt reports looking down and moving slowly decreases dizziness. Dizziness at  worst:  8/10 and at best: 1/10 and current: 3/10. Pt particpates deep water aerobics and exercise classes but has reduced the frequency. Pt reports she tends to be a sleep person normally but is more fatigued since the onset of dizziness. At end of session pt also reported head congestion and fullness in ears began around the same time as dizziness. Pt reported meclizine did not help dizziness.   Pertinent History Hx of skin CA, depression   Patient Stated Goals I would like for this to go away and go back to my normal state.    Currently in Pain? No/denies     Family care resident, Dr. Francesca Oman, present during session.       [facility] PT Assessment - 05/09/15 0001    Assessment   Medical Diagnosis Vertigo   Referring Provider Dr. Elease Hashimoto   Onset Date/Surgical Date 04/12/15   Prior Therapy none for vertigo   Precautions   Precautions None   Restrictions   Weight Bearing Restrictions No   Balance Screen   Has the patient fallen in the past 6 months No   Has the patient had a decrease in activity level because of a fear of falling?  Yes   Is the patient reluctant to leave their home because of a fear of falling?  No   Home Environment   Living Environment Private residence   Living Arrangements Spouse/significant other   Available Help at Discharge Family   Type  LTGs.           PT Long Term Goals - 06/02/15 1054    PT LONG TERM GOAL #1   Title Pt will be IND in HEP to decrease dizziness and improve balance. Target date: 06/06/15   Status New   PT LONG TERM GOAL #2   Title Pt will report </=1/10 dizziness during all functional activiites (lookin up/side to side) in order to improve functional mobility. Target date: 06/06/15   Status New   PT LONG TERM GOAL #3   Title Perform FGA and write goal. Target date: 06/06/15   Status New   PT LONG TERM GOAL #4   Title Pt will amb. 600' over even/uneven terrain, IND, while performing head turns with dizziness not incr. more than 1 point to improve functional mobility. Target date: 06/06/15   Status New   PT LONG TERM GOAL #5   Title Pt will improve DHI score from 62 to 44 to improve quality of life.Target date: 06/06/15   Status New               Plan - 06/02/2015 1050    Clinical Impression Statement Pt is a 70y/o female presenting to OPPT neuro for vertigo. 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 Sx's  improved with R Epleys treatment. Pt also experienced gait deviations and impaired balance during exam. Pt will continue to monitor pt's progress closely and refer back to MD if dizziness does not resolve, as Dr. Berkley Harvey suspected pt might have vestibular neuritis.    Pt will benefit from skilled therapeutic intervention in order to improve on the following deficits Abnormal gait;Dizziness;Decreased mobility;Decreased balance   Rehab Potential Good   Clinical Impairments Affecting Rehab Potential Hx of dizziness many years ago.   PT Frequency 2x / week   PT Duration 4 weeks   PT Treatment/Interventions ADLs/Self Care Home Management;Vestibular;Manual techniques;Therapeutic exercise;Therapeutic activities;Stair training;Gait training;Canalith Repostioning;Biofeedback;DME Instruction;Cognitive remediation;Neuromuscular re-education;Patient/family education   PT Next Visit Plan Reassess for BPPV and perform FGA and write goal. Provide balance HEP and gaze stab. HEP   Consulted and Agree with Plan of Care Patient          G-Codes - 2015/06/02 1057    Functional Assessment Tool Used DHI: 62, no HEP   Functional Limitation Self care   Self Care Current Status [id number]) At least 60 percent but less than 80 percent impaired, limited or restricted   Self Care Goal Status [id number]) At least 1 percent but less than 20 percent impaired, limited or restricted       Problem List Patient Active Problem List   Diagnosis Date Noted  . Basal cell cancer 11/23/2010  . Squamous cell skin cancer 11/23/2010  . HEMATURIA UNSPECIFIED 10/09/2009  . 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 URGENCY OF URINATION 11/11/2008  . POSTMENOPAUSAL STATUS 11/11/2008  . DEPRESSIVE DISORDER NOT ELSEWHERE CLASSIFIED 07/27/2008  . GERD 07/27/2008    Asir Bingley L 06-02-15, 10:58 AM  [facility] [address] [place], [place], [address] Phone: [phone]   Fax:   [phone]  Name: EZELLA BEAS MRN: [id number] Date of Birth: May 25, 1945   Geoffry Paradise, PT,DPT 2015-06-02 10:58 AM Phone: [phone] Fax: [phone]

## 2015-05-10 DIAGNOSIS — G43909 Migraine, unspecified, not intractable, without status migrainosus: Secondary | ICD-10-CM | POA: Diagnosis not present

## 2015-05-11 DIAGNOSIS — M1711 Unilateral primary osteoarthritis, right knee: Secondary | ICD-10-CM | POA: Diagnosis not present

## 2015-05-11 DIAGNOSIS — M1712 Unilateral primary osteoarthritis, left knee: Secondary | ICD-10-CM | POA: Diagnosis not present

## 2015-05-16 ENCOUNTER — Ambulatory Visit: Payer: Medicare Other

## 2015-05-17 ENCOUNTER — Ambulatory Visit: Payer: Medicare Other | Admitting: Rehabilitative and Restorative Service Providers"

## 2015-05-17 DIAGNOSIS — R42 Dizziness and giddiness: Secondary | ICD-10-CM

## 2015-05-17 DIAGNOSIS — R269 Unspecified abnormalities of gait and mobility: Secondary | ICD-10-CM

## 2015-05-17 NOTE — Patient Instructions (Addendum)
Tip Card 1.The goal of habituation training is to assist in decreasing symptoms of vertigo, dizziness, or nausea provoked by specific head and body motions. 2.These exercises may initially increase symptoms; however, be persistent and work through symptoms. With repetition and time, the exercises will assist in reducing or eliminating symptoms. 3.Exercises should be stopped and discussed with the therapist if you experience any of the following: - Sudden change or fluctuation in hearing - New onset of ringing in the ears, or increase in current intensity - Any fluid discharge from the ear - Severe pain in neck or back - Extreme nausea  Copyright  VHI. All rights reserved.  Rolling   With pillow under head, start on back. Roll to your right side.  Hold until dizziness stops, plus 20 seconds and then roll to the left side.  Hold until dizziness stops, plus 20 seconds.  Repeat sequence 5 times per session. Do 2 sessions per day.  Copyright  VHI. All rights reserved.  Gaze Stabilization: Tip Card 1.Target must remain in focus, not blurry, and appear stationary while head is in motion. 2.Perform exercises with small head movements (45 to either side of midline). 3.Increase speed of head motion so long as target is in focus. 4.If you wear eyeglasses, be sure you can see target through lens (therapist will give specific instructions for bifocal / progressive lenses). 5.These exercises may provoke dizziness or nausea. Work through these symptoms. If too dizzy, slow head movement slightly. Rest between each exercise. 6.Exercises demand concentration; avoid distractions. 7.For safety, perform standing exercises close to a counter, wall, corner, or next to someone.  Copyright  VHI. All rights reserved.  Gaze Stabilization: Standing Feet Apart   Feet shoulder width apart, keeping eyes on target on wall 3 feet away, tilt head down slightly and move head side to side for 30 seconds. Repeat while  moving head up and down for 30 seconds. Do 2 sessions per day.   Copyright  VHI. All rights reserved.   Feet Together (Compliant Surface) Head Motion - Eyes Open    With eyes open, standing on compliant surface: _pillow___, feet together, move head slowly: up and down x 5 repetitions.  Let symptoms settle.  Repeat moving head side to side x 5 reps. Do __2__ sessions per day.  Copyright  VHI. All rights reserved.  Feet Together (Compliant Surface) Varied Arm Positions - Eyes Closed    Stand on compliant surface: __pillow______ with feet together and arms at your side. Close eyes and visualize upright position. Hold__30__ seconds. Repeat __3__ times per session. Do _2___ sessions per day.  Copyright  VHI. All rights reserved.

## 2015-05-17 NOTE — Therapy (Signed)
[facility] [address] [place], [place], [address] Phone: [phone]   Fax:  [phone]  Physical Therapy Treatment  Patient Details  Name: Mia Peterson MRN: [id number] Date of Birth: 21-May-1945 Referring Provider: Dr. Elease Hashimoto  Encounter Date: 05/17/2015      PT End of Session - 05/17/15 0953    Visit Number 2   Number of Visits 9   Date for PT Re-Evaluation 06/08/15   Authorization Type G-code and progress note every 10th visit.   PT Start Time 0805   PT Stop Time 0850   PT Time Calculation (min) 45 min   Activity Tolerance Patient tolerated treatment well   Behavior During Therapy [facility] for tasks assessed/performed      No past medical history on file.  Past Surgical History  Procedure Laterality Date  . Breast enhancement surgery  1978  . Tubal ligation  1972  . Varicose vein surgery  1978  . Abdominal hysterectomy  1993    menorrhagia.    There were no vitals filed for this visit.  Visit Diagnosis:  Dizziness and giddiness  Abnormality of gait      Subjective Assessment - 05/17/15 0806    Subjective The patient reports that symptoms are worse in the morning.  She has improved since initial treatment.  She reports on days where symptoms are worse, she wakes with a sensation of "my head is in the cloud."   Pertinent History Hx of skin CA, depression   Patient Stated Goals I would like for this to go away and go back to my normal state.    Currently in Pain? No/denies                Vestibular Assessment - 05/17/15 0806    Vestibular Assessment   General Observation Patient reports baseline 3/10 today.  Had prior 2 days of reduced symptoms.     Positional Testing   Dix-Hallpike Dix-Hallpike Right;Dix-Hallpike Left   Sidelying Test Sidelying Right;Sidelying Left   Horizontal Canal Testing Horizontal Canal Right;Horizontal Canal Left   Dix-Hallpike Right   Dix-Hallpike Right  Duration no subjective reports today   Dix-Hallpike Right Symptoms No nystagmus   Dix-Hallpike Left   Dix-Hallpike Left Duration mild dizziness reported   Dix-Hallpike Left Symptoms No nystagmus   Sidelying Right   Sidelying Right Duration mild spinning sensation with return to sitting.  Some symptoms described as baseline of 3/10 today, no increase with R sidelying.   Sidelying Right Symptoms No nystagmus   Horizontal Canal Right   Horizontal Canal Right Duration mild subjective report    Horizontal Canal Right Symptoms Normal   Horizontal Canal Left   Horizontal Canal Left Duration none   Horizontal Canal Left Symptoms Normal   Orthostatics   BP supine (x 5 minutes) 134/81 mmHg   HR supine (x 5 minutes) 60   BP standing (after 1 minute) 113/81 mmHg   HR standing (after 1 minute) 70   BP standing (after 3 minutes) 134/88 mmHg   HR standing (after 3 minutes) 78   Orthostatics Comment Patient does not have significant c/o dizziness when transitioning supine > standing, however during brandt daroff, "lightheaded" sensation described with return to sit from both left and right sides.  Recommended no performance of brandt daroff at this time and used horizontal rolling, turns and gaze for HEP.                 [facility] PT Treatment/Exercise -  noted with head/body movements aggravated during gaze, rolling and sit<>sidelying.  She c/o "lightheadedness" during return to sitting and orthostatics were significant for >20 mmHG change on systolic, which may be contributing to overall presentation.  PT provided HEP for gaze adaptation, habituation, and balance at today's session.   PT Next Visit Plan Perform FGA and write goal.  Check balance and gaze x 1 viewing for HEP.   Consulted and Agree with Plan of Care Patient        Problem List Patient Active Problem List   Diagnosis Date Noted  . Basal cell cancer 11/23/2010  . Squamous cell skin cancer 11/23/2010  . HEMATURIA UNSPECIFIED 10/09/2009  . 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 URGENCY OF URINATION 11/11/2008  . POSTMENOPAUSAL STATUS 11/11/2008  . DEPRESSIVE DISORDER NOT ELSEWHERE CLASSIFIED 07/27/2008  . GERD 07/27/2008    Noeh Sparacino, PT 05/17/2015, 9:56 AM  [facility] [address] [place], [place], [address] Phone: [phone]   Fax:  [phone]  Name: Mia Peterson MRN: [id number] Date of Birth: 10-29-1945  noted with head/body movements aggravated during gaze, rolling and sit<>sidelying.  She c/o "lightheadedness" during return to sitting and orthostatics were significant for >20 mmHG change on systolic, which may be contributing to overall presentation.  PT provided HEP for gaze adaptation, habituation, and balance at today's session.   PT Next Visit Plan Perform FGA and write goal.  Check balance and gaze x 1 viewing for HEP.   Consulted and Agree with Plan of Care Patient        Problem List Patient Active Problem List   Diagnosis Date Noted  . Basal cell cancer 11/23/2010  . Squamous cell skin cancer 11/23/2010  . HEMATURIA UNSPECIFIED 10/09/2009  . 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 URGENCY OF URINATION 11/11/2008  . POSTMENOPAUSAL STATUS 11/11/2008  . DEPRESSIVE DISORDER NOT ELSEWHERE CLASSIFIED 07/27/2008  . GERD 07/27/2008    Noeh Sparacino, PT 05/17/2015, 9:56 AM  [facility] [address] [place], [place], [address] Phone: [phone]   Fax:  [phone]  Name: Mia Peterson MRN: [id number] Date of Birth: 10-29-1945

## 2015-05-18 DIAGNOSIS — M1712 Unilateral primary osteoarthritis, left knee: Secondary | ICD-10-CM | POA: Diagnosis not present

## 2015-05-18 DIAGNOSIS — M1711 Unilateral primary osteoarthritis, right knee: Secondary | ICD-10-CM | POA: Diagnosis not present

## 2015-05-23 ENCOUNTER — Ambulatory Visit: Payer: Medicare Other

## 2015-05-23 DIAGNOSIS — R269 Unspecified abnormalities of gait and mobility: Secondary | ICD-10-CM

## 2015-05-23 DIAGNOSIS — R42 Dizziness and giddiness: Secondary | ICD-10-CM

## 2015-05-23 NOTE — Therapy (Signed)
[facility] [address] [place], [place], [address] Phone: [phone]   Fax:  [phone]  Physical Therapy Treatment  Patient Details  Name: Mia Peterson MRN: [id number] Date of Birth: 1945/03/13 Referring Provider: Dr. Elease Hashimoto  Encounter Date: 05/23/2015      PT End of Session - 05/23/15 1134    Visit Number 3   Number of Visits 9   Date for PT Re-Evaluation 06/08/15   Authorization Type G-code and progress note every 10th visit.   PT Start Time 0807  pt late 2/2 traffic   PT Stop Time 0839   PT Time Calculation (min) 32 min   Equipment Utilized During Treatment Gait belt   Activity Tolerance Patient tolerated treatment well   Behavior During Therapy [facility] for tasks assessed/performed      History reviewed. No pertinent past medical history.  Past Surgical History  Procedure Laterality Date  . Breast enhancement surgery  1978  . Tubal ligation  1972  . Varicose vein surgery  1978  . Abdominal hysterectomy  1993    menorrhagia.    There were no vitals filed for this visit.  Visit Diagnosis:  Dizziness and giddiness  Abnormality of gait      Subjective Assessment - 05/23/15 0808    Subjective Pt reported she is feeling better and has experienced a great week. Pt ingested a lot of salty food yesterday and has felt that her ears are stopped up due to fluid.  Pt feels better and is able to drive without dizziness.    Pertinent History Hx of skin CA, depression   Patient Stated Goals I would like for this to go away and go back to my normal state.    Currently in Pain? No/denies            [facility] PT Assessment - 05/23/15 0820    Functional Gait  Assessment   Gait assessed  Yes   Gait Level Surface Walks 20 ft in less than 5.5 sec, no assistive devices, good speed, no evidence for imbalance, normal gait pattern, deviates no more than 6 in outside of the 12 in walkway width.  4.4sec.   Change  in Gait Speed Able to smoothly change walking speed without loss of balance or gait deviation. Deviate no more than 6 in outside of the 12 in walkway width.   Gait with Horizontal Head Turns Performs head turns smoothly with slight change in gait velocity (eg, minor disruption to smooth gait path), deviates 6-10 in outside 12 in walkway width, or uses an assistive device.   Gait with Vertical Head Turns Performs head turns with no change in gait. Deviates no more than 6 in outside 12 in walkway width.   Gait and Pivot Turn Pivot turns safely within 3 sec and stops quickly with no loss of balance.   Step Over Obstacle Is able to step over 2 stacked shoe boxes taped together (9 in total height) without changing gait speed. No evidence of imbalance.   Gait with Narrow Base of Support Ambulates 7-9 steps.   Gait with Eyes Closed Walks 20 ft, no assistive devices, good speed, no evidence of imbalance, normal gait pattern, deviates no more than 6 in outside 12 in walkway width. Ambulates 20 ft in less than 7 sec.   Ambulating Backwards Walks 20 ft, uses assistive device, slower speed, mild gait deviations, deviates 6-10 in outside 12 in walkway width.   Steps Alternating feet, no rail.  Total Score 27   FGA comment: Pt reported 2/10 while performing head turns, which quickly subsided and pt reported 0/10 at end of FGA.       Neuro re-ed: Pt performed balance and gaze stabilization HEP with cues for technique. Please see pt instructions and pt education for details. Performed with S to ensure safety. Balance performed in corner with chair and gaze stab. With chair behind pt.                        PT Education - 05/23/15 1132    Education provided Yes   Education Details PT reviewed HEP with pt and provided cues to turn head to L side and R side during gaze stab. (as pt turned head to R and then back to midline vs. turning R and then L), PT also progressed gaze stab. to placing card  on mirror. PT provided cues during standing on pillows with eyes closed as pt had not been performing this activity.   Person(s) Educated Patient   Methods Demonstration;Explanation;Verbal cues;Handout   Comprehension Returned demonstration;Verbalized understanding          PT Short Term Goals - 05/09/15 1054    PT SHORT TERM GOAL #1   Title same as LTGs.           PT Long Term Goals - 05/09/15 1054    PT LONG TERM GOAL #1   Title Pt will be IND in HEP to decrease dizziness and improve balance. Target date: 06/06/15   Status New   PT LONG TERM GOAL #2   Title Pt will report </=1/10 dizziness during all functional activiites (lookin up/side to side) in order to improve functional mobility. Target date: 06/06/15   Status New   PT LONG TERM GOAL #3   Title Perform FGA and write goal. Target date: 06/06/15   Status New   PT LONG TERM GOAL #4   Title Pt will amb. 600' over even/uneven terrain, IND, while performing head turns with dizziness not incr. more than 1 point to improve functional mobility. Target date: 06/06/15   Status New   PT LONG TERM GOAL #5   Title Pt will improve DHI score from 62 to 44 to improve quality of life.Target date: 06/06/15   Status New               Plan - 05/23/15 1134    Clinical Impression Statement Pt demonstrated progress, and reports she is happy with her progress and agrees to decr. frequency. PT will assess goals next week and potentially d/c based on pt's progress. Pt continues to experience dizziness during habituation HEP, but intensity decr. with each rep. Pt's FGA score indicates pt is at low risk for falls. continue with POC.    Pt will benefit from skilled therapeutic intervention in order to improve on the following deficits Abnormal gait;Dizziness;Decreased mobility;Decreased balance   Rehab Potential Good   Clinical Impairments Affecting Rehab Potential Hx of dizziness many years ago.   PT Frequency 2x / week   PT Duration 4 weeks    PT Treatment/Interventions ADLs/Self Care Home Management;Vestibular;Manual techniques;Therapeutic exercise;Therapeutic activities;Stair training;Gait training;Canalith Repostioning;Biofeedback;DME Instruction;Cognitive remediation;Neuromuscular re-education;Patient/family education   PT Next Visit Plan Assess goals and potentially d/c based on progress. Progress gaze to feet together    PT Home Exercise Plan Balance and gaze stab. HEP   Consulted and Agree with Plan of Care Patient        Problem  List Patient Active Problem List   Diagnosis Date Noted  . Basal cell cancer 11/23/2010  . Squamous cell skin cancer 11/23/2010  . HEMATURIA UNSPECIFIED 10/09/2009  . 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 URGENCY OF URINATION 11/11/2008  . POSTMENOPAUSAL STATUS 11/11/2008  . DEPRESSIVE DISORDER NOT ELSEWHERE CLASSIFIED 07/27/2008  . GERD 07/27/2008    Miller,Jennifer L 05/23/2015, 11:37 AM  [facility] [address] [place], [place], [address] Phone: [phone]   Fax:  [phone]  Name: MELAYSIA AGOGLIA MRN: [id number] Date of Birth: 1945/05/06    Geoffry Paradise, PT,DPT 05/23/2015 11:37 AM Phone: [phone] Fax: [phone]

## 2015-05-23 NOTE — Patient Instructions (Signed)
Tip Card 1.The goal of habituation training is to assist in decreasing symptoms of vertigo, dizziness, or nausea provoked by specific head and body motions. 2.These exercises may initially increase symptoms; however, be persistent and work through symptoms. With repetition and time, the exercises will assist in reducing or eliminating symptoms. 3.Exercises should be stopped and discussed with the therapist if you experience any of the following: - Sudden change or fluctuation in hearing - New onset of ringing in the ears, or increase in current intensity - Any fluid discharge from the ear - Severe pain in neck or back - Extreme nausea  Copyright  VHI. All rights reserved.  Rolling   With pillow under head, start on back. Roll to your right side. Hold until dizziness stops, plus 20 seconds and then roll to the left side. Hold until dizziness stops, plus 20 seconds. Repeat sequence 5 times per session. Do 2 sessions per day.  Copyright  VHI. All rights reserved.  Gaze Stabilization: Tip Card 1.Target must remain in focus, not blurry, and appear stationary while head is in motion. 2.Perform exercises with small head movements (45 to either side of midline). 3.Increase speed of head motion so long as target is in focus. 4.If you wear eyeglasses, be sure you can see target through lens (therapist will give specific instructions for bifocal / progressive lenses). 5.These exercises may provoke dizziness or nausea. Work through these symptoms. If too dizzy, slow head movement slightly. Rest between each exercise. 6.Exercises demand concentration; avoid distractions. 7.For safety, perform standing exercises close to a counter, wall, corner, or next to someone.  Copyright  VHI. All rights reserved.  Gaze Stabilization: Standing Feet Apart   Feet shoulder width apart, keeping eyes on target on wall 3 feet away, tilt head down slightly and move head side to side for 30 seconds. Repeat while  moving head up and down for 30 seconds. Do 2 sessions per day.   Copyright  VHI. All rights reserved.   Feet Together (Compliant Surface) Head Motion - Eyes Open    With eyes open, standing on compliant surface: _pillow___, feet together, move head slowly: up and down x 5 repetitions. Let symptoms settle. Repeat moving head side to side x 5 reps. Do __2__ sessions per day.  Copyright  VHI. All rights reserved.  Feet Together (Compliant Surface) Varied Arm Positions - Eyes Closed    Stand on compliant surface: __pillow______ with feet together and arms at your side. Close eyes and visualize upright position. Hold__30__ seconds. Repeat __3__ times per session. Do _2___ sessions per day.  Copyright  VHI. All rights reserved.

## 2015-05-30 ENCOUNTER — Ambulatory Visit: Payer: Medicare Other | Attending: Family Medicine | Admitting: Physical Therapy

## 2015-05-30 DIAGNOSIS — R269 Unspecified abnormalities of gait and mobility: Secondary | ICD-10-CM | POA: Insufficient documentation

## 2015-05-30 DIAGNOSIS — M1711 Unilateral primary osteoarthritis, right knee: Secondary | ICD-10-CM | POA: Diagnosis not present

## 2015-05-30 DIAGNOSIS — R2689 Other abnormalities of gait and mobility: Secondary | ICD-10-CM

## 2015-05-30 DIAGNOSIS — R42 Dizziness and giddiness: Secondary | ICD-10-CM | POA: Insufficient documentation

## 2015-05-30 NOTE — Therapy (Signed)
[facility] [address] [place], [place], [address] Phone: [phone]   Fax:  [phone]  Physical Therapy Treatment  Patient Details  Name: Mia Peterson MRN: [id number] Date of Birth: May 15, 1945 Referring Provider: Dr. Elease Hashimoto  Encounter Date: 05/30/2015      PT End of Session - 05/30/15 0948    Visit Number 4   PT Start Time 0931   PT Stop Time 0949   PT Time Calculation (min) 18 min   Equipment Utilized During Treatment Gait belt   Activity Tolerance Patient tolerated treatment well   Behavior During Therapy [facility] for tasks assessed/performed      No past medical history on file.  Past Surgical History  Procedure Laterality Date  . Breast enhancement surgery  1978  . Tubal ligation  1972  . Varicose vein surgery  1978  . Abdominal hysterectomy  1993    menorrhagia.    There were no vitals filed for this visit.  Visit Diagnosis:  Dizziness and giddiness  Abnormality of gait      Subjective Assessment - 05/30/15 0934    Subjective no dizziness at all since last session.  feels ready to d/c. still states her "head doesn't feel clear, but I'm not dizzy."     Pertinent History Hx of skin CA, depression   Patient Stated Goals I would like for this to go away and go back to my normal state.             [facility] PT Assessment - 05/30/15 0945    Observation/Other Assessments   Focus on Therapeutic Outcomes (FOTO)  DHI: 0%   Ambulation/Gait   Ambulation/Gait Yes   Ambulation/Gait Assistance 7: Independent   Ambulation/Gait Assistance Details no increase in symptoms and head turns performed on grass without difficulty   Ambulation Distance (Feet) 600 Feet   Assistive device None   Gait Pattern Within Functional Limits   Ambulation Surface Level;Unlevel;Indoor;Outdoor;Grass;Paved                             PT Education - 05/30/15 0948    Education provided Yes   Education  Details appropriate return to PT; current exercises and continue v/s d/c exercises   Person(s) Educated Patient   Methods Explanation   Comprehension Verbalized understanding          PT Short Term Goals - 05/09/15 1054    PT SHORT TERM GOAL #1   Title same as LTGs.           PT Long Term Goals - 05/30/15 0947    PT LONG TERM GOAL #1   Title Pt will be IND in HEP to decrease dizziness and improve balance. Target date: 06/06/15   Status Achieved   PT LONG TERM GOAL #2   Title Pt will report </=1/10 dizziness during all functional activiites (lookin up/side to side) in order to improve functional mobility. Target date: 06/06/15   Status Achieved   PT LONG TERM GOAL #3   Title Perform FGA and write goal. Target date: 06/06/15   Baseline 27/30 with initial testing; no goal necessary   Status Achieved   PT LONG TERM GOAL #4   Title Pt will amb. 600' over even/uneven terrain, IND, while performing head turns with dizziness not incr. more than 1 point to improve functional mobility. Target date: 06/06/15   Status Achieved   PT LONG TERM GOAL #5  Title Pt will improve DHI score from 62 to 44 to improve quality of life.Target date: 06/06/15   Status Achieved               Plan - Jun 11, 2015 0955    Clinical Impression Statement Pt has met all goals and is ready for d/c from PT.  Pt verbalized compliance with HEP but no symptoms anymore.  Recommended pt stop exercises if she does 2-3 full days without any symptoms.  Pt reports occasional episodes of "feels like I'm in a fog" and increased fatigue but feel this is unrelated to vertigo.  Pt agreeable and reports she will follow up with MD if these symptoms persist.  Recommend d/c at this time.   PT Next Visit Plan s/c PT          G-Codes - 06-11-2015 0958    Functional Assessment Tool Used DHI 0; compliant with HEP   Functional Limitation Self care   Self Care Goal Status ([id number]) At least 1 percent but less than 20 percent  impaired, limited or restricted   Self Care Discharge Status [phone]) At least 1 percent but less than 20 percent impaired, limited or restricted      Problem List Patient Active Problem List   Diagnosis Date Noted  . Basal cell cancer 11/23/2010  . Squamous cell skin cancer 11/23/2010  . HEMATURIA UNSPECIFIED 10/09/2009  . 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 URGENCY OF URINATION 11/11/2008  . POSTMENOPAUSAL STATUS 11/11/2008  . DEPRESSIVE DISORDER NOT ELSEWHERE CLASSIFIED 07/27/2008  . GERD 07/27/2008   Laureen Abrahams, PT, DPT 2015/06/11 10:03 AM  [facility] [address] [place], [place], [address] Phone: [phone]   Fax:  [phone]  Name: Mia Peterson MRN: [id number] Date of Birth: 27-Jan-1946      PHYSICAL THERAPY DISCHARGE SUMMARY  Visits from Start of Care: 4  Current functional level related to goals / functional outcomes: See above; all goals met   Remaining deficits: DHI score is 0 indicating no dizziness and no longer affecting function.  Pt reports occasional episodes of fatigue and feeling in a "fog" but states she feels this is unrelated to vertigo.   Education / Equipment: HEP  Plan: Patient agrees to discharge.  Patient goals were met. Patient is being discharged due to meeting the stated rehab goals.  ?????     Laureen Abrahams, PT, DPT 11-Jun-2015 10:05 AM  [facility] [address] [place], [place] [address]  [phone] (office) [phone] (fax)

## 2015-06-02 NOTE — Addendum Note (Signed)
Addended by: Laureen Abrahams on: 06/02/2015 07:32 AM   Modules accepted: Orders

## 2015-06-08 ENCOUNTER — Encounter: Payer: Medicare Other | Admitting: Rehabilitative and Restorative Service Providers"

## 2015-06-12 DIAGNOSIS — M25561 Pain in right knee: Secondary | ICD-10-CM | POA: Diagnosis not present

## 2015-07-10 DIAGNOSIS — D225 Melanocytic nevi of trunk: Secondary | ICD-10-CM | POA: Diagnosis not present

## 2015-07-10 DIAGNOSIS — Z85828 Personal history of other malignant neoplasm of skin: Secondary | ICD-10-CM | POA: Diagnosis not present

## 2015-07-10 DIAGNOSIS — L820 Inflamed seborrheic keratosis: Secondary | ICD-10-CM | POA: Diagnosis not present

## 2015-07-10 DIAGNOSIS — L570 Actinic keratosis: Secondary | ICD-10-CM | POA: Diagnosis not present

## 2015-07-10 DIAGNOSIS — L821 Other seborrheic keratosis: Secondary | ICD-10-CM | POA: Diagnosis not present

## 2015-07-10 DIAGNOSIS — D0461 Carcinoma in situ of skin of right upper limb, including shoulder: Secondary | ICD-10-CM | POA: Diagnosis not present

## 2015-07-10 DIAGNOSIS — C44519 Basal cell carcinoma of skin of other part of trunk: Secondary | ICD-10-CM | POA: Diagnosis not present

## 2015-07-10 DIAGNOSIS — D485 Neoplasm of uncertain behavior of skin: Secondary | ICD-10-CM | POA: Diagnosis not present

## 2015-07-10 DIAGNOSIS — L920 Granuloma annulare: Secondary | ICD-10-CM | POA: Diagnosis not present

## 2015-07-10 DIAGNOSIS — D0462 Carcinoma in situ of skin of left upper limb, including shoulder: Secondary | ICD-10-CM | POA: Diagnosis not present

## 2015-09-12 ENCOUNTER — Other Ambulatory Visit: Payer: Self-pay | Admitting: Orthopedic Surgery

## 2015-09-12 DIAGNOSIS — M1711 Unilateral primary osteoarthritis, right knee: Secondary | ICD-10-CM | POA: Diagnosis not present

## 2015-09-13 DIAGNOSIS — Z85828 Personal history of other malignant neoplasm of skin: Secondary | ICD-10-CM | POA: Diagnosis not present

## 2015-09-13 DIAGNOSIS — C44622 Squamous cell carcinoma of skin of right upper limb, including shoulder: Secondary | ICD-10-CM | POA: Diagnosis not present

## 2015-09-28 ENCOUNTER — Encounter: Payer: Self-pay | Admitting: Family Medicine

## 2015-10-02 ENCOUNTER — Ambulatory Visit (INDEPENDENT_AMBULATORY_CARE_PROVIDER_SITE_OTHER): Payer: Medicare Other | Admitting: Family Medicine

## 2015-10-02 VITALS — BP 110/80 | HR 102 | Temp 98.5°F | Ht 67.5 in | Wt 155.6 lb

## 2015-10-02 DIAGNOSIS — Z01818 Encounter for other preprocedural examination: Secondary | ICD-10-CM | POA: Diagnosis not present

## 2015-10-02 NOTE — Progress Notes (Signed)
Subjective:     [name], female   DOB: 10/26/1945, 70 y.o.   MRN: [id number]  HPI Patient here for presurgical clearance for right total knee replacement. She has no history of cardiac or pulmonary problems. Exercises regularly about 5 days per week with combination water aerobics and recumbent bike has had no difficulties with that. No exertional dyspnea or chest pains. Nonsmoker. No history of hypertension. No diabetes. She has excellent HDL.  Mother did have coronary disease in her late 3s but was a smoker. Takes Omeprazole but no other regular medications.  Nodular lesion right lower leg.  Just noted 2-3 weeks ago and growing quickly in size.  Past hx of squamous cell skin cancer  No past medical history on file. Past Surgical History:  Procedure Laterality Date  . ABDOMINAL HYSTERECTOMY  1993   menorrhagia.  Marland Kitchen BREAST ENHANCEMENT SURGERY  1978  . TUBAL LIGATION  1972  . [place]    reports that she quit smoking about 25 years ago. Her smoking use included Cigarettes. She has a 20.00 pack-year smoking history. She does not have any smokeless tobacco history on file. She reports that she drinks about 1.8 oz of alcohol per week . Her drug history is not on file. family history includes Cancer (age of onset: 30) in her father; Heart disease (age of onset: 56) in her mother. No Known Allergies   Review of Systems  Constitutional: Negative for fatigue.  Eyes: Negative for visual disturbance.  Respiratory: Negative for cough, chest tightness, shortness of breath and wheezing.   Cardiovascular: Negative for chest pain, palpitations and leg swelling.  Gastrointestinal: Negative for abdominal pain.  Genitourinary: Negative for dysuria.  Skin: Negative for rash.  Neurological: Negative for dizziness, seizures, syncope, weakness, light-headedness and headaches.  Hematological: Negative for adenopathy.       Objective:   Physical Exam   Constitutional: She is oriented to person, place, and time. She appears well-developed and well-nourished. No distress.  Neck: Neck supple. No thyromegaly present.  No carotid bruits.  Cardiovascular: Normal rate and regular rhythm.  Exam reveals no gallop.   No murmur heard. Pulmonary/Chest: Effort normal and breath sounds normal. No respiratory distress. She has no wheezes. She has no rales.  Abdominal: Soft. She exhibits no mass. There is no tenderness. There is no rebound and no guarding.  Musculoskeletal: She exhibits no edema.  Lymphadenopathy:    She has no cervical adenopathy.  Neurological: She is alert and oriented to person, place, and time. No cranial nerve deficit.  Skin:  Nodule right anterior lower leg about 1 cm diameter- firm to palpation       Assessment:     Preoperative clearance. Patient has fairly low risk for CAD other than age. No recent concerning symptoms  Nodule right lower leg- rule out squamous cell cancer.    Plan:     -Check EKG-NSR with no acute changes. -no medical or cardiac contraindications to surgery at this time -pt already has follow up arranged with derm for skin lesion above.  Eulas Post MD [facility]

## 2015-10-02 NOTE — Progress Notes (Signed)
Pre visit review using our clinic review tool, if applicable. No additional management support is needed unless otherwise documented below in the visit note. 

## 2015-10-03 ENCOUNTER — Encounter: Payer: Self-pay | Admitting: Family Medicine

## 2015-10-03 DIAGNOSIS — C44722 Squamous cell carcinoma of skin of right lower limb, including hip: Secondary | ICD-10-CM | POA: Diagnosis not present

## 2015-10-03 DIAGNOSIS — C44629 Squamous cell carcinoma of skin of left upper limb, including shoulder: Secondary | ICD-10-CM | POA: Diagnosis not present

## 2015-10-03 DIAGNOSIS — Z85828 Personal history of other malignant neoplasm of skin: Secondary | ICD-10-CM | POA: Diagnosis not present

## 2015-10-03 DIAGNOSIS — D485 Neoplasm of uncertain behavior of skin: Secondary | ICD-10-CM | POA: Diagnosis not present

## 2015-10-25 NOTE — Pre-Procedure Instructions (Signed)
Mia Peterson  10/25/2015     Your procedure is scheduled on : Friday November 03, 2015 at 9:35 AM.  Report to [facility] Admitting at 7:35 AM.  Call this number if you have problems the morning of surgery: [phone]    Remember:  Do not eat food or drink liquids after midnight.  Take these medicines the morning of surgery with A SIP OF WATER : Omeprazole (Prilosec); May use Restasis eye drops  Stop taking ALL vitamins, herbal medications/supplements, NSAIDs, Ibuprofen, Advil, Motrin, Aleve, etc on Friday September 1st    Do not wear jewelry, make-up or nail polish.  Do not wear lotions, powders, or perfumes, or deoderant.  Do not shave 48 hours prior to surgery.    Do not bring valuables to the hospital.  [facility] is not responsible for any belongings or valuables.  Contacts, dentures or bridgework may not be worn into surgery.  Leave your suitcase in the car.  After surgery it may be brought to your room.  For patients admitted to the hospital, discharge time will be determined by your treatment team.  Patients discharged the day of surgery will not be allowed to drive home.   Name and phone number of your driver:    Special instructions:  Shower using CHG soap the night before and the morning of your surgery  Please read over the following fact sheets that you were given. Pain Booklet, Total Joint Packet and MRSA Information

## 2015-10-26 ENCOUNTER — Encounter (HOSPITAL_COMMUNITY): Payer: Self-pay

## 2015-10-26 ENCOUNTER — Encounter (HOSPITAL_COMMUNITY)
Admission: RE | Admit: 2015-10-26 | Discharge: 2015-10-26 | Disposition: A | Discharge status: Home / Self Care | Payer: Medicare Other | Source: Ambulatory Visit | Attending: Orthopedic Surgery | Admitting: Orthopedic Surgery

## 2015-10-26 DIAGNOSIS — K219 Gastro-esophageal reflux disease without esophagitis: Secondary | ICD-10-CM | POA: Insufficient documentation

## 2015-10-26 DIAGNOSIS — Z0183 Encounter for blood typing: Secondary | ICD-10-CM | POA: Insufficient documentation

## 2015-10-26 DIAGNOSIS — Z87891 Personal history of nicotine dependence: Secondary | ICD-10-CM | POA: Diagnosis not present

## 2015-10-26 DIAGNOSIS — Z01812 Encounter for preprocedural laboratory examination: Secondary | ICD-10-CM | POA: Diagnosis not present

## 2015-10-26 DIAGNOSIS — M1711 Unilateral primary osteoarthritis, right knee: Secondary | ICD-10-CM | POA: Diagnosis not present

## 2015-10-26 DIAGNOSIS — Z01818 Encounter for other preprocedural examination: Secondary | ICD-10-CM

## 2015-10-26 DIAGNOSIS — R911 Solitary pulmonary nodule: Secondary | ICD-10-CM | POA: Diagnosis not present

## 2015-10-26 HISTORY — DX: Unspecified asthma, uncomplicated: J45.909

## 2015-10-26 HISTORY — DX: Unspecified injury of head, initial encounter: S09.90XA

## 2015-10-26 HISTORY — DX: Other complications of anesthesia, initial encounter: T88.59XA

## 2015-10-26 HISTORY — DX: Unspecified osteoarthritis, unspecified site: M19.90

## 2015-10-26 HISTORY — DX: Syncope and collapse: R55

## 2015-10-26 HISTORY — DX: Reserved for inherently not codable concepts without codable children: IMO0001

## 2015-10-26 HISTORY — DX: Malignant (primary) neoplasm, unspecified: C80.1

## 2015-10-26 HISTORY — DX: Adverse effect of unspecified anesthetic, initial encounter: T41.45XA

## 2015-10-26 HISTORY — DX: Dizziness and giddiness: R42

## 2015-10-26 HISTORY — DX: Gastro-esophageal reflux disease without esophagitis: K21.9

## 2015-10-26 LAB — CBC WITH DIFFERENTIAL/PLATELET
BASOS ABS: 0 10*3/uL (ref 0.0–0.1)
Basophils Relative: 0 %
Eosinophils Absolute: 0.1 10*3/uL (ref 0.0–0.7)
Eosinophils Relative: 2 %
HCT: 41.3 % (ref 36.0–46.0)
HEMOGLOBIN: 13.4 g/dL (ref 12.0–15.0)
LYMPHS ABS: 1.5 10*3/uL (ref 0.7–4.0)
LYMPHS PCT: 27 %
MCH: 29.1 pg (ref 26.0–34.0)
MCHC: 32.4 g/dL (ref 30.0–36.0)
MCV: 89.8 fL (ref 78.0–100.0)
Monocytes Absolute: 0.7 10*3/uL (ref 0.1–1.0)
Monocytes Relative: 13 %
NEUTROS PCT: 58 %
Neutro Abs: 3.1 10*3/uL (ref 1.7–7.7)
PLATELETS: 288 10*3/uL (ref 150–400)
RBC: 4.6 MIL/uL (ref 3.87–5.11)
RDW: 14 % (ref 11.5–15.5)
WBC: 5.4 10*3/uL (ref 4.0–10.5)

## 2015-10-26 LAB — COMPREHENSIVE METABOLIC PANEL
ALK PHOS: 60 U/L (ref 38–126)
ALT: 19 U/L (ref 14–54)
AST: 30 U/L (ref 15–41)
Albumin: 3.8 g/dL (ref 3.5–5.0)
Anion gap: 8 (ref 5–15)
BUN: 12 mg/dL (ref 6–20)
CALCIUM: 9.2 mg/dL (ref 8.9–10.3)
CHLORIDE: 102 mmol/L (ref 101–111)
CO2: 27 mmol/L (ref 22–32)
CREATININE: 0.72 mg/dL (ref 0.44–1.00)
Glucose, Bld: 90 mg/dL (ref 65–99)
Potassium: 3.9 mmol/L (ref 3.5–5.1)
SODIUM: 137 mmol/L (ref 135–145)
Total Bilirubin: 0.5 mg/dL (ref 0.3–1.2)
Total Protein: 6.4 g/dL — ABNORMAL LOW (ref 6.5–8.1)

## 2015-10-26 LAB — TYPE AND SCREEN
ABO/RH(D): O POS
Antibody Screen: NEGATIVE

## 2015-10-26 LAB — PROTIME-INR
INR: 1.01
Prothrombin Time: 13.3 seconds (ref 11.4–15.2)

## 2015-10-26 LAB — URINALYSIS, ROUTINE W REFLEX MICROSCOPIC
Bilirubin Urine: NEGATIVE
GLUCOSE, UA: NEGATIVE mg/dL
Hgb urine dipstick: NEGATIVE
KETONES UR: NEGATIVE mg/dL
LEUKOCYTES UA: NEGATIVE
NITRITE: NEGATIVE
PROTEIN: NEGATIVE mg/dL
Specific Gravity, Urine: 1.006 (ref 1.005–1.030)
pH: 7.5 (ref 5.0–8.0)

## 2015-10-26 LAB — SURGICAL PCR SCREEN
MRSA, PCR: NEGATIVE
Staphylococcus aureus: NEGATIVE

## 2015-10-26 LAB — ABO/RH: ABO/RH(D): O POS

## 2015-10-26 LAB — APTT: APTT: 26 s (ref 24–36)

## 2015-10-27 ENCOUNTER — Other Ambulatory Visit: Payer: Self-pay | Admitting: Orthopedic Surgery

## 2015-10-31 ENCOUNTER — Other Ambulatory Visit (HOSPITAL_BASED_OUTPATIENT_CLINIC_OR_DEPARTMENT_OTHER): Payer: Self-pay | Admitting: Orthopedic Surgery

## 2015-10-31 ENCOUNTER — Other Ambulatory Visit (HOSPITAL_COMMUNITY): Payer: Medicare Other

## 2015-10-31 DIAGNOSIS — R9389 Abnormal findings on diagnostic imaging of other specified body structures: Secondary | ICD-10-CM

## 2015-11-01 ENCOUNTER — Ambulatory Visit (HOSPITAL_BASED_OUTPATIENT_CLINIC_OR_DEPARTMENT_OTHER)
Admission: RE | Admit: 2015-11-01 | Discharge: 2015-11-01 | Disposition: A | Discharge status: Home / Self Care | Payer: Medicare Other | Source: Ambulatory Visit | Attending: Orthopedic Surgery | Admitting: Orthopedic Surgery

## 2015-11-01 DIAGNOSIS — R938 Abnormal findings on diagnostic imaging of other specified body structures: Secondary | ICD-10-CM | POA: Insufficient documentation

## 2015-11-01 DIAGNOSIS — R9389 Abnormal findings on diagnostic imaging of other specified body structures: Secondary | ICD-10-CM

## 2015-11-01 DIAGNOSIS — R918 Other nonspecific abnormal finding of lung field: Secondary | ICD-10-CM | POA: Insufficient documentation

## 2015-11-02 MED ORDER — CEFAZOLIN SODIUM-DEXTROSE 2-4 GM/100ML-% IV SOLN
2.0000 g | INTRAVENOUS | Status: AC
  Administered 2015-11-03: 2 g via INTRAVENOUS
  Filled 2015-11-02: qty 100

## 2015-11-02 NOTE — H&P (Signed)
TOTAL KNEE ADMISSION H&P  Patient is being admitted for right total knee arthroplasty.  Subjective:  Chief Complaint:right knee pain.  HPI: Mia Peterson, 70 y.o. female, has a history of pain and functional disability in the right knee due to arthritis and has failed non-surgical conservative treatments for greater than 12 weeks to includeNSAID's and/or analgesics, corticosteriod injections, viscosupplementation injections, flexibility and strengthening excercises, use of assistive devices, weight reduction as appropriate and activity modification.  Onset of symptoms was gradual, starting 5 years ago with gradually worsening course since that time. The patient noted no past surgery on the right knee(s).  Patient currently rates pain in the right knee(s) at 8 out of 10 with activity. Patient has night pain, worsening of pain with activity and weight bearing, pain that interferes with activities of daily living, pain with passive range of motion, crepitus and joint swelling.  Patient has evidence of subchondral sclerosis, periarticular osteophytes and joint space narrowing by imaging studies. This patient has had failure of the wrist will conservative care. There is no active infection.  Patient Active Problem List   Diagnosis Date Noted  . Basal cell cancer 11/23/2010  . Squamous cell skin cancer 11/23/2010  . HEMATURIA UNSPECIFIED 10/09/2009  . 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 URGENCY OF URINATION 11/11/2008  . POSTMENOPAUSAL STATUS 11/11/2008  . DEPRESSIVE DISORDER NOT ELSEWHERE CLASSIFIED 07/27/2008  . GERD 07/27/2008   Past Medical History:  Diagnosis Date  . Arthritis    right knee and foot- OSteo   . Asthma    treated in 20's and 30's  . Cancer (HCC)    skin- squamous basal  . Complication of anesthesia    very slow to awaken   . GERD (gastroesophageal reflux disease)    prn Prilosec  . Head injury, closed    10/26/15- years ago - 30ish  . Shortness of breath dyspnea    with exertion   . Vaso vagal episode    passed out 2 times, close to passing out 3 times  . Vertigo    was treated with rehab March and April 2017, does exercises if she begins to have symptoms    Past Surgical History:  Procedure Laterality Date  . ABDOMINAL HYSTERECTOMY  1993   menorrhagia.  . BREAST ENHANCEMENT SURGERY  1978  . COLONOSCOPY W/ POLYPECTOMY    . KNEE ARTHROSCOPY W/ MENISCAL REPAIR Right   . TUBAL LIGATION  1972  . VARICOSE VEIN SURGERY  1978    No prescriptions prior to admission.   Allergies  Allergen Reactions  . No Known Allergies     Social History  Substance Use Topics  . Smoking status: Former Smoker    Packs/day: 1.00    Years: 20.00    Types: Cigarettes    Quit date: 03/24/1990  . Smokeless tobacco: Former User  . Alcohol use 3.6 oz/week    6 Glasses of wine per week    Family History  Problem Relation Age of Onset  . Heart disease Mother 63  . Cancer Father 90    pancreatic     ROS   ROS: I have reviewed the patient's review of systems thoroughly and there are no positive responses as relates to the HPI. No Known Allergies  Exam: Objective:  Physical Exam Well-developed well-nourished patient in no acute distress. Alert and oriented x3 HEENT:within normal limits Cardiac: Regular rate and rhythm Pulmonary: Lungs clear to auscultation Abdomen: Soft and nontender.  Normal active bowel sounds  Musculoskeletal: right knee:   Painful range of motion.  Limited range of motion.  Neurovascularly intact distally.  No instability.  Trace effusion. Vital signs in last 24 hours:    Labs:  Recent Results (from the past 2160 hour(s))  Surgical pcr screen     Status: None   Collection Time: 10/26/15  9:52 AM  Result Value Ref Range   MRSA, PCR NEGATIVE NEGATIVE   Staphylococcus aureus NEGATIVE NEGATIVE    Comment:        The Xpert SA Assay (FDA approved for NASAL specimens in patients over 10 years of age), is one component of a comprehensive  surveillance program.  Test performance has been validated by [facility] for patients greater than or equal to 86 year old. It is not intended to diagnose infection nor to guide or monitor treatment.   Urinalysis, Routine w reflex microscopic (not at [facility])     Status: None   Collection Time: 10/26/15  9:53 AM  Result Value Ref Range   Color, Urine YELLOW YELLOW   APPearance CLEAR CLEAR   Specific Gravity, Urine 1.006 1.005 - 1.030   pH 7.5 5.0 - 8.0   Glucose, UA NEGATIVE NEGATIVE mg/dL   Hgb urine dipstick NEGATIVE NEGATIVE   Bilirubin Urine NEGATIVE NEGATIVE   Ketones, ur NEGATIVE NEGATIVE mg/dL   Protein, ur NEGATIVE NEGATIVE mg/dL   Nitrite NEGATIVE NEGATIVE   Leukocytes, UA NEGATIVE NEGATIVE    Comment: MICROSCOPIC NOT DONE ON URINES WITH NEGATIVE PROTEIN, BLOOD, LEUKOCYTES, NITRITE, OR GLUCOSE <1000 mg/dL.  APTT     Status: None   Collection Time: 10/26/15 10:00 AM  Result Value Ref Range   aPTT 26 24 - 36 seconds  CBC WITH DIFFERENTIAL     Status: None   Collection Time: 10/26/15 10:00 AM  Result Value Ref Range   WBC 5.4 4.0 - 10.5 K/uL   RBC 4.60 3.87 - 5.11 MIL/uL   Hemoglobin 13.4 12.0 - 15.0 g/dL   HCT 41.3 36.0 - 46.0 %   MCV 89.8 78.0 - 100.0 fL   MCH 29.1 26.0 - 34.0 pg   MCHC 32.4 30.0 - 36.0 g/dL   RDW 14.0 11.5 - 15.5 %   Platelets 288 150 - 400 K/uL   Neutrophils Relative % 58 %   Neutro Abs 3.1 1.7 - 7.7 K/uL   Lymphocytes Relative 27 %   Lymphs Abs 1.5 0.7 - 4.0 K/uL   Monocytes Relative 13 %   Monocytes Absolute 0.7 0.1 - 1.0 K/uL   Eosinophils Relative 2 %   Eosinophils Absolute 0.1 0.0 - 0.7 K/uL   Basophils Relative 0 %   Basophils Absolute 0.0 0.0 - 0.1 K/uL  Comprehensive metabolic panel     Status: Abnormal   Collection Time: 10/26/15 10:00 AM  Result Value Ref Range   Sodium 137 135 - 145 mmol/L   Potassium 3.9 3.5 - 5.1 mmol/L   Chloride 102 101 - 111 mmol/L   CO2 27 22 - 32 mmol/L   Glucose, Bld 90 65 - 99 mg/dL   BUN 12 6 - 20  mg/dL   Creatinine, Ser 0.72 0.44 - 1.00 mg/dL   Calcium 9.2 8.9 - 10.3 mg/dL   Total Protein 6.4 (L) 6.5 - 8.1 g/dL   Albumin 3.8 3.5 - 5.0 g/dL   AST 30 15 - 41 U/L   ALT 19 14 - 54 U/L   Alkaline Phosphatase 60 38 - 126 U/L   Total Bilirubin 0.5 0.3 - 1.2 mg/dL  GFR calc non Af Amer >60 >60 mL/min   GFR calc Af Amer >60 >60 mL/min    Comment: (NOTE) The eGFR has been calculated using the CKD EPI equation. This calculation has not been validated in all clinical situations. eGFR's persistently <60 mL/min signify possible Chronic Kidney Disease.    Anion gap 8 5 - 15  Protime-INR     Status: None   Collection Time: 10/26/15 10:00 AM  Result Value Ref Range   Prothrombin Time 13.3 11.4 - 15.2 seconds   INR 1.01   Type and screen Order type and screen if day of surgery is less than 15 days from draw of preadmission visit or order morning of surgery if day of surgery is greater than 6 days from preadmission visit.     Status: None   Collection Time: 10/26/15 10:05 AM  Result Value Ref Range   ABO/RH(D) O POS    Antibody Screen NEG    Sample Expiration 11/09/2015    Extend sample reason NO TRANSFUSIONS OR PREGNANCY IN THE PAST 3 MONTHS   ABO/Rh     Status: None   Collection Time: 10/26/15 10:05 AM  Result Value Ref Range   ABO/RH(D) O POS     Estimated body mass index is 23.73 kg/m as calculated from the following:   Height as of 10/26/15: 5' 8" (1.727 m).   Weight as of 10/26/15: 70.8 kg (156 lb 1.6 oz).   Imaging Review Plain radiographs demonstrate severe degenerative joint disease of the right knee(s). The overall alignment ismild varus. The bone quality appears to be fair for age and reported activity level.  Assessment/Plan:  End stage arthritis, right knee   The patient history, physical examination, clinical judgment of the provider and imaging studies are consistent with end stage degenerative joint disease of the right knee(s) and total knee arthroplasty is  deemed medically necessary. The treatment options including medical management, injection therapy arthroscopy and arthroplasty were discussed at length. The risks and benefits of total knee arthroplasty were presented and reviewed. The risks due to aseptic loosening, infection, stiffness, patella tracking problems, thromboembolic complications and other imponderables were discussed. The patient acknowledged the explanation, agreed to proceed with the plan and consent was signed. Patient is being admitted for inpatient treatment for surgery, pain control, PT, OT, prophylactic antibiotics, VTE prophylaxis, progressive ambulation and ADL's and discharge planning. The patient is planning to be discharged home with home health services  

## 2015-11-03 ENCOUNTER — Encounter (HOSPITAL_COMMUNITY)
Admission: RE | Disposition: A | Discharge status: Home Health | Payer: Self-pay | Source: Ambulatory Visit | Attending: Orthopedic Surgery

## 2015-11-03 ENCOUNTER — Inpatient Hospital Stay (HOSPITAL_COMMUNITY)
Admission: RE | Admit: 2015-11-03 | Discharge: 2015-11-05 | DRG: 470 | Disposition: A | Discharge status: Home Health | Payer: Medicare Other | Source: Ambulatory Visit | Attending: Orthopedic Surgery | Admitting: Orthopedic Surgery

## 2015-11-03 ENCOUNTER — Inpatient Hospital Stay (HOSPITAL_COMMUNITY): Payer: Medicare Other | Admitting: Anesthesiology

## 2015-11-03 ENCOUNTER — Encounter (HOSPITAL_COMMUNITY): Payer: Self-pay | Admitting: *Deleted

## 2015-11-03 DIAGNOSIS — M1711 Unilateral primary osteoarthritis, right knee: Secondary | ICD-10-CM | POA: Diagnosis not present

## 2015-11-03 DIAGNOSIS — Z87891 Personal history of nicotine dependence: Secondary | ICD-10-CM

## 2015-11-03 DIAGNOSIS — M25561 Pain in right knee: Secondary | ICD-10-CM | POA: Diagnosis not present

## 2015-11-03 DIAGNOSIS — Z85828 Personal history of other malignant neoplasm of skin: Secondary | ICD-10-CM | POA: Diagnosis not present

## 2015-11-03 DIAGNOSIS — F329 Major depressive disorder, single episode, unspecified: Secondary | ICD-10-CM | POA: Diagnosis present

## 2015-11-03 DIAGNOSIS — K219 Gastro-esophageal reflux disease without esophagitis: Secondary | ICD-10-CM | POA: Diagnosis present

## 2015-11-03 DIAGNOSIS — M179 Osteoarthritis of knee, unspecified: Secondary | ICD-10-CM | POA: Diagnosis not present

## 2015-11-03 HISTORY — PX: TOTAL KNEE ARTHROPLASTY: SHX125

## 2015-11-03 SURGERY — ARTHROPLASTY, KNEE, TOTAL
Anesthesia: Spinal | Site: Knee | Laterality: Right

## 2015-11-03 MED ORDER — CEFAZOLIN SODIUM-DEXTROSE 2-4 GM/100ML-% IV SOLN
2.0000 g | Freq: Four times a day (QID) | INTRAVENOUS | Status: AC
  Administered 2015-11-03 (×2): 2 g via INTRAVENOUS
  Filled 2015-11-03 (×3): qty 100

## 2015-11-03 MED ORDER — GLYCOPYRROLATE 0.2 MG/ML IV SOSY
PREFILLED_SYRINGE | INTRAVENOUS | Status: AC
  Filled 2015-11-03: qty 3

## 2015-11-03 MED ORDER — PROPOFOL 10 MG/ML IV BOLUS
INTRAVENOUS | Status: DC | PRN
  Administered 2015-11-03 (×2): 10 mg via INTRAVENOUS
  Administered 2015-11-03: 20 mg via INTRAVENOUS

## 2015-11-03 MED ORDER — POLYETHYLENE GLYCOL 3350 17 G PO PACK: 17.0000 g | PACK | Freq: Every day | ORAL | Status: DC | PRN

## 2015-11-03 MED ORDER — BUPIVACAINE HCL (PF) 0.5 % IJ SOLN
INTRAMUSCULAR | Status: AC
  Filled 2015-11-03: qty 30

## 2015-11-03 MED ORDER — LIDOCAINE HCL (CARDIAC) 20 MG/ML IV SOLN
INTRAVENOUS | Status: DC | PRN
  Administered 2015-11-03: 40 mg via INTRATRACHEAL

## 2015-11-03 MED ORDER — BISACODYL 5 MG PO TBEC: 5.0000 mg | DELAYED_RELEASE_TABLET | Freq: Every day | ORAL | Status: DC | PRN

## 2015-11-03 MED ORDER — DIPHENHYDRAMINE HCL 12.5 MG/5ML PO ELIX
12.5000 mg | ORAL_SOLUTION | ORAL | Status: DC | PRN
  Filled 2015-11-03: qty 10

## 2015-11-03 MED ORDER — FENTANYL CITRATE (PF) 100 MCG/2ML IJ SOLN
INTRAMUSCULAR | Status: AC
  Filled 2015-11-03: qty 2

## 2015-11-03 MED ORDER — METOPROLOL TARTARATE 1 MG/ML SYRINGE (5ML)
Status: DC | PRN
  Administered 2015-11-03: .5 mg via INTRAVENOUS

## 2015-11-03 MED ORDER — BUPIVACAINE HCL (PF) 0.5 % IJ SOLN
INTRAMUSCULAR | Status: DC | PRN
  Administered 2015-11-03: 20 mL

## 2015-11-03 MED ORDER — FENTANYL CITRATE (PF) 100 MCG/2ML IJ SOLN
INTRAMUSCULAR | Status: DC | PRN
  Administered 2015-11-03 (×2): 50 ug via INTRAVENOUS

## 2015-11-03 MED ORDER — ASPIRIN EC 325 MG PO TBEC: 325.0000 mg | DELAYED_RELEASE_TABLET | Freq: Two times a day (BID) | ORAL | 0 refills | Status: DC

## 2015-11-03 MED ORDER — ZOLPIDEM TARTRATE 5 MG PO TABS: 5.0000 mg | ORAL_TABLET | Freq: Every evening | ORAL | Status: DC | PRN

## 2015-11-03 MED ORDER — ONDANSETRON HCL 4 MG/2ML IJ SOLN: 4.0000 mg | Freq: Four times a day (QID) | INTRAMUSCULAR | Status: DC | PRN

## 2015-11-03 MED ORDER — PROPOFOL 500 MG/50ML IV EMUL
INTRAVENOUS | Status: DC | PRN
  Administered 2015-11-03: 25 ug/kg/min via INTRAVENOUS

## 2015-11-03 MED ORDER — ACETAMINOPHEN 325 MG PO TABS
650.0000 mg | ORAL_TABLET | Freq: Four times a day (QID) | ORAL | Status: DC | PRN
  Administered 2015-11-04: 650 mg via ORAL
  Filled 2015-11-03: qty 2

## 2015-11-03 MED ORDER — OXYCODONE HCL 5 MG/5ML PO SOLN: 5.0000 mg | Freq: Once | ORAL | Status: AC | PRN

## 2015-11-03 MED ORDER — GLYCOPYRROLATE 0.2 MG/ML IJ SOLN
INTRAMUSCULAR | Status: DC | PRN
  Administered 2015-11-03: 0.2 mg via INTRAVENOUS

## 2015-11-03 MED ORDER — OXYCODONE HCL 5 MG PO TABS
5.0000 mg | ORAL_TABLET | ORAL | Status: DC | PRN
  Administered 2015-11-03: 10 mg via ORAL
  Administered 2015-11-04 (×2): 5 mg via ORAL
  Administered 2015-11-04: 10 mg via ORAL
  Administered 2015-11-05 (×2): 5 mg via ORAL
  Filled 2015-11-03: qty 2
  Filled 2015-11-03: qty 1
  Filled 2015-11-03: qty 2
  Filled 2015-11-03 (×2): qty 1
  Filled 2015-11-03: qty 2
  Filled 2015-11-03: qty 1

## 2015-11-03 MED ORDER — TRANEXAMIC ACID 1000 MG/10ML IV SOLN
1000.0000 mg | Freq: Once | INTRAVENOUS | Status: AC
  Administered 2015-11-03: 1000 mg via INTRAVENOUS
  Filled 2015-11-03: qty 10

## 2015-11-03 MED ORDER — DOCUSATE SODIUM 100 MG PO CAPS
100.0000 mg | ORAL_CAPSULE | Freq: Two times a day (BID) | ORAL | Status: DC
  Administered 2015-11-03 – 2015-11-05 (×5): 100 mg via ORAL
  Filled 2015-11-03 (×5): qty 1

## 2015-11-03 MED ORDER — CHLORHEXIDINE GLUCONATE 4 % EX LIQD: 60.0000 mL | Freq: Once | CUTANEOUS | Status: DC

## 2015-11-03 MED ORDER — METHOCARBAMOL 500 MG PO TABS
ORAL_TABLET | ORAL | Status: AC
  Filled 2015-11-03: qty 1

## 2015-11-03 MED ORDER — MAGNESIUM CITRATE PO SOLN: 1.0000 | Freq: Once | ORAL | Status: DC | PRN

## 2015-11-03 MED ORDER — KETAMINE HCL-SODIUM CHLORIDE 100-0.9 MG/10ML-% IV SOSY
PREFILLED_SYRINGE | INTRAVENOUS | Status: AC
  Filled 2015-11-03: qty 10

## 2015-11-03 MED ORDER — ONDANSETRON HCL 4 MG PO TABS
4.0000 mg | ORAL_TABLET | Freq: Four times a day (QID) | ORAL | Status: DC | PRN
  Administered 2015-11-05: 4 mg via ORAL
  Filled 2015-11-03: qty 1

## 2015-11-03 MED ORDER — BUPIVACAINE IN DEXTROSE 0.75-8.25 % IT SOLN
INTRATHECAL | Status: DC | PRN
  Administered 2015-11-03: 1.8 mL via INTRATHECAL

## 2015-11-03 MED ORDER — KETOROLAC TROMETHAMINE 15 MG/ML IJ SOLN
15.0000 mg | Freq: Three times a day (TID) | INTRAMUSCULAR | Status: AC
  Administered 2015-11-03 – 2015-11-04 (×4): 15 mg via INTRAVENOUS
  Filled 2015-11-03 (×3): qty 1

## 2015-11-03 MED ORDER — HYDROMORPHONE HCL 1 MG/ML IJ SOLN
INTRAMUSCULAR | Status: AC
  Filled 2015-11-03: qty 1

## 2015-11-03 MED ORDER — PHENYLEPHRINE HCL 10 MG/ML IJ SOLN
INTRAVENOUS | Status: DC | PRN
  Administered 2015-11-03: 25 ug/min via INTRAVENOUS

## 2015-11-03 MED ORDER — PROPOFOL 1000 MG/100ML IV EMUL
INTRAVENOUS | Status: AC
  Filled 2015-11-03: qty 100

## 2015-11-03 MED ORDER — KETOROLAC TROMETHAMINE 15 MG/ML IJ SOLN
INTRAMUSCULAR | Status: AC
  Filled 2015-11-03: qty 1

## 2015-11-03 MED ORDER — OXYCODONE HCL 5 MG PO TABS
ORAL_TABLET | ORAL | Status: AC
  Filled 2015-11-03: qty 1

## 2015-11-03 MED ORDER — BUPIVACAINE LIPOSOME 1.3 % IJ SUSP
20.0000 mL | INTRAMUSCULAR | Status: AC
  Administered 2015-11-03: 20 mL
  Filled 2015-11-03: qty 20

## 2015-11-03 MED ORDER — OXYCODONE-ACETAMINOPHEN 5-325 MG PO TABS: 1.0000 | ORAL_TABLET | ORAL | 0 refills | Status: DC | PRN

## 2015-11-03 MED ORDER — METOPROLOL TARTRATE 5 MG/5ML IV SOLN
INTRAVENOUS | Status: AC
  Filled 2015-11-03: qty 5

## 2015-11-03 MED ORDER — MIDAZOLAM HCL 2 MG/2ML IJ SOLN
INTRAMUSCULAR | Status: AC
  Filled 2015-11-03: qty 2

## 2015-11-03 MED ORDER — ACETAMINOPHEN 160 MG/5ML PO SOLN
325.0000 mg | ORAL | Status: DC | PRN
  Filled 2015-11-03: qty 20.3

## 2015-11-03 MED ORDER — HYDROMORPHONE HCL 1 MG/ML IJ SOLN
0.2500 mg | INTRAMUSCULAR | Status: DC | PRN
  Administered 2015-11-03: 0.5 mg via INTRAVENOUS

## 2015-11-03 MED ORDER — OXYCODONE HCL 5 MG PO TABS
5.0000 mg | ORAL_TABLET | Freq: Once | ORAL | Status: AC | PRN
  Administered 2015-11-03: 5 mg via ORAL

## 2015-11-03 MED ORDER — SODIUM CHLORIDE 0.9 % IR SOLN
Status: DC | PRN
  Administered 2015-11-03: 1000 mL

## 2015-11-03 MED ORDER — PROPOFOL 10 MG/ML IV BOLUS
INTRAVENOUS | Status: AC
  Filled 2015-11-03: qty 20

## 2015-11-03 MED ORDER — ONDANSETRON HCL 4 MG/2ML IJ SOLN
INTRAMUSCULAR | Status: DC | PRN
  Administered 2015-11-03: 4 mg via INTRAVENOUS

## 2015-11-03 MED ORDER — ASPIRIN EC 325 MG PO TBEC
325.0000 mg | DELAYED_RELEASE_TABLET | Freq: Two times a day (BID) | ORAL | Status: DC
  Administered 2015-11-03 – 2015-11-05 (×5): 325 mg via ORAL
  Filled 2015-11-03 (×4): qty 1

## 2015-11-03 MED ORDER — DEXAMETHASONE SODIUM PHOSPHATE 10 MG/ML IJ SOLN
10.0000 mg | Freq: Two times a day (BID) | INTRAMUSCULAR | Status: AC
  Administered 2015-11-03 – 2015-11-04 (×3): 10 mg via INTRAVENOUS
  Filled 2015-11-03 (×3): qty 1

## 2015-11-03 MED ORDER — METHOCARBAMOL 500 MG PO TABS
500.0000 mg | ORAL_TABLET | Freq: Four times a day (QID) | ORAL | Status: DC | PRN
  Administered 2015-11-03: 500 mg via ORAL
  Filled 2015-11-03: qty 1

## 2015-11-03 MED ORDER — MIDAZOLAM HCL 2 MG/2ML IJ SOLN
INTRAMUSCULAR | Status: DC | PRN
  Administered 2015-11-03 (×2): 1 mg via INTRAVENOUS

## 2015-11-03 MED ORDER — HYDROMORPHONE HCL 1 MG/ML IJ SOLN
0.5000 mg | INTRAMUSCULAR | Status: DC | PRN
  Administered 2015-11-03: 1 mg via INTRAVENOUS
  Filled 2015-11-03: qty 1

## 2015-11-03 MED ORDER — ACETAMINOPHEN 650 MG RE SUPP: 650.0000 mg | Freq: Four times a day (QID) | RECTAL | Status: DC | PRN

## 2015-11-03 MED ORDER — METHOCARBAMOL 1000 MG/10ML IJ SOLN
500.0000 mg | Freq: Four times a day (QID) | INTRAVENOUS | Status: DC | PRN
  Filled 2015-11-03: qty 5

## 2015-11-03 MED ORDER — ACETAMINOPHEN 325 MG PO TABS: 325.0000 mg | ORAL_TABLET | ORAL | Status: DC | PRN

## 2015-11-03 MED ORDER — LIDOCAINE 2% (20 MG/ML) 5 ML SYRINGE
INTRAMUSCULAR | Status: AC
  Filled 2015-11-03: qty 5

## 2015-11-03 MED ORDER — SODIUM CHLORIDE 0.9 % IV SOLN
INTRAVENOUS | Status: DC
  Administered 2015-11-03: 100 mL/h via INTRAVENOUS

## 2015-11-03 MED ORDER — PANTOPRAZOLE SODIUM 40 MG PO TBEC
40.0000 mg | DELAYED_RELEASE_TABLET | Freq: Every day | ORAL | Status: DC
  Administered 2015-11-04 – 2015-11-05 (×2): 40 mg via ORAL
  Filled 2015-11-03 (×2): qty 1

## 2015-11-03 MED ORDER — TIZANIDINE HCL 2 MG PO TABS: 2.0000 mg | ORAL_TABLET | Freq: Three times a day (TID) | ORAL | 0 refills | Status: DC | PRN

## 2015-11-03 MED ORDER — TRANEXAMIC ACID 1000 MG/10ML IV SOLN
1000.0000 mg | INTRAVENOUS | Status: DC
  Filled 2015-11-03: qty 10

## 2015-11-03 MED ORDER — KETAMINE HCL 10 MG/ML IJ SOLN
INTRAMUSCULAR | Status: DC | PRN
  Administered 2015-11-03 (×2): 20 mg via INTRAVENOUS

## 2015-11-03 MED ORDER — 0.9 % SODIUM CHLORIDE (POUR BTL) OPTIME
TOPICAL | Status: DC | PRN
  Administered 2015-11-03: 1000 mL

## 2015-11-03 MED ORDER — ALUM & MAG HYDROXIDE-SIMETH 200-200-20 MG/5ML PO SUSP: 30.0000 mL | ORAL | Status: DC | PRN

## 2015-11-03 MED ORDER — SODIUM CHLORIDE 0.9 % IJ SOLN
INTRAMUSCULAR | Status: DC | PRN
  Administered 2015-11-03: 20 mL

## 2015-11-03 MED ORDER — CYCLOSPORINE 0.05 % OP EMUL
1.0000 [drp] | Freq: Two times a day (BID) | OPHTHALMIC | Status: DC
  Administered 2015-11-03 – 2015-11-05 (×4): 1 [drp] via OPHTHALMIC
  Filled 2015-11-03 (×5): qty 1

## 2015-11-03 MED ORDER — LACTATED RINGERS IV SOLN
INTRAVENOUS | Status: DC
  Administered 2015-11-03 (×2): via INTRAVENOUS

## 2015-11-03 MED ORDER — ONDANSETRON HCL 4 MG/2ML IJ SOLN
INTRAMUSCULAR | Status: AC
  Filled 2015-11-03: qty 2

## 2015-11-03 SURGICAL SUPPLY — 62 items
BANDAGE ESMARK 6X9 LF (GAUZE/BANDAGES/DRESSINGS) ×1 IMPLANT
BENZOIN TINCTURE PRP APPL 2/3 (GAUZE/BANDAGES/DRESSINGS) ×2 IMPLANT
BLADE SAGITTAL 25.0X1.19X90 (BLADE) ×2 IMPLANT
BLADE SAW SAG 90X13X1.27 (BLADE) ×2 IMPLANT
BNDG ESMARK 6X9 LF (GAUZE/BANDAGES/DRESSINGS) ×2
BOWL SMART MIX CTS (DISPOSABLE) ×2 IMPLANT
CAPT KNEE TOTAL 3 ATTUNE ×2 IMPLANT
CEMENT HV SMART SET (Cement) ×4 IMPLANT
COVER SURGICAL LIGHT HANDLE (MISCELLANEOUS) ×2 IMPLANT
CUFF TOURNIQUET SINGLE 34IN LL (TOURNIQUET CUFF) ×2 IMPLANT
CUFF TOURNIQUET SINGLE 44IN (TOURNIQUET CUFF) IMPLANT
DRAPE EXTREMITY T 121X128X90 (DRAPE) ×2 IMPLANT
DRAPE IMP U-DRAPE 54X76 (DRAPES) ×2 IMPLANT
DRAPE U-SHAPE 47X51 STRL (DRAPES) ×2 IMPLANT
DRSG AQUACEL AG ADV 3.5X10 (GAUZE/BANDAGES/DRESSINGS) ×2 IMPLANT
DRSG MEPILEX BORDER 4X12 (GAUZE/BANDAGES/DRESSINGS) ×2 IMPLANT
DRSG PAD ABDOMINAL 8X10 ST (GAUZE/BANDAGES/DRESSINGS) ×2 IMPLANT
DURAPREP 26ML APPLICATOR (WOUND CARE) ×2 IMPLANT
ELECT REM PT RETURN 9FT ADLT (ELECTROSURGICAL) ×2
ELECTRODE REM PT RTRN 9FT ADLT (ELECTROSURGICAL) ×1 IMPLANT
EVACUATOR 1/8 PVC DRAIN (DRAIN) ×2 IMPLANT
FACESHIELD WRAPAROUND (MASK) ×2 IMPLANT
GAUZE SPONGE 4X4 12PLY STRL (GAUZE/BANDAGES/DRESSINGS) ×2 IMPLANT
GLOVE BIOGEL PI IND STRL 8 (GLOVE) ×2 IMPLANT
GLOVE BIOGEL PI INDICATOR 8 (GLOVE) ×2
GLOVE ECLIPSE 7.5 STRL STRAW (GLOVE) ×4 IMPLANT
GOWN STRL REUS W/ TWL LRG LVL3 (GOWN DISPOSABLE) ×1 IMPLANT
GOWN STRL REUS W/ TWL XL LVL3 (GOWN DISPOSABLE) ×2 IMPLANT
GOWN STRL REUS W/TWL LRG LVL3 (GOWN DISPOSABLE) ×1
GOWN STRL REUS W/TWL XL LVL3 (GOWN DISPOSABLE) ×2
HANDPIECE INTERPULSE COAX TIP (DISPOSABLE) ×1
HOOD PEEL AWAY FACE SHEILD DIS (HOOD) ×6 IMPLANT
IMMOBILIZER KNEE 20 (SOFTGOODS) IMPLANT
IMMOBILIZER KNEE 22 UNIV (SOFTGOODS) ×2 IMPLANT
KIT BASIN OR (CUSTOM PROCEDURE TRAY) ×2 IMPLANT
KIT ROOM TURNOVER OR (KITS) ×2 IMPLANT
MANIFOLD NEPTUNE II (INSTRUMENTS) ×2 IMPLANT
NEEDLE SPNL 22GX3.5 QUINCKE BK (NEEDLE) ×2 IMPLANT
NS IRRIG 1000ML POUR BTL (IV SOLUTION) ×2 IMPLANT
PACK TOTAL JOINT (CUSTOM PROCEDURE TRAY) ×2 IMPLANT
PACK UNIVERSAL I (CUSTOM PROCEDURE TRAY) ×2 IMPLANT
PAD ARMBOARD 7.5X6 YLW CONV (MISCELLANEOUS) ×4 IMPLANT
PAD CAST 4YDX4 CTTN HI CHSV (CAST SUPPLIES) ×1 IMPLANT
PADDING CAST COTTON 4X4 STRL (CAST SUPPLIES) ×1
PADDING CAST COTTON 6X4 STRL (CAST SUPPLIES) ×2 IMPLANT
SET HNDPC FAN SPRY TIP SCT (DISPOSABLE) ×1 IMPLANT
SPONGE GAUZE 4X4 12PLY STER LF (GAUZE/BANDAGES/DRESSINGS) ×2 IMPLANT
STAPLER VISISTAT 35W (STAPLE) IMPLANT
STRIP CLOSURE SKIN 1/2X4 (GAUZE/BANDAGES/DRESSINGS) ×4 IMPLANT
SUCTION FRAZIER HANDLE 10FR (MISCELLANEOUS) ×1
SUCTION TUBE FRAZIER 10FR DISP (MISCELLANEOUS) ×1 IMPLANT
SUT MNCRL AB 3-0 PS2 18 (SUTURE) IMPLANT
SUT VIC AB 0 CTB1 27 (SUTURE) ×4 IMPLANT
SUT VIC AB 1 CT1 27 (SUTURE) ×4
SUT VIC AB 1 CT1 27XBRD ANBCTR (SUTURE) ×4 IMPLANT
SUT VIC AB 2-0 CTB1 (SUTURE) ×4 IMPLANT
SYR 50ML LL SCALE MARK (SYRINGE) ×2 IMPLANT
TOWEL OR 17X24 6PK STRL BLUE (TOWEL DISPOSABLE) ×2 IMPLANT
TOWEL OR 17X26 10 PK STRL BLUE (TOWEL DISPOSABLE) ×2 IMPLANT
TRAY CATH 16FR W/PLASTIC CATH (SET/KITS/TRAYS/PACK) ×2 IMPLANT
TRAY FOLEY CATH 16FRSI W/METER (SET/KITS/TRAYS/PACK) IMPLANT
WRAP KNEE MAXI GEL POST OP (GAUZE/BANDAGES/DRESSINGS) ×2 IMPLANT

## 2015-11-03 NOTE — Anesthesia Preprocedure Evaluation (Addendum)
Anesthesia Evaluation  Patient identified by MRN, date of birth, ID band Patient awake    Reviewed: Allergy & Precautions, NPO status , Patient's Chart, lab work & pertinent test results  History of Anesthesia Complications Negative for: history of anesthetic complications  Airway Mallampati: II  TM Distance: >3 FB Neck ROM: Full    Dental  (+) Teeth Intact   Pulmonary neg pulmonary ROS, former smoker,    breath sounds clear to auscultation       Cardiovascular negative cardio ROS   Rhythm:Regular     Neuro/Psych PSYCHIATRIC DISORDERS Depression negative neurological ROS     GI/Hepatic GERD  Medicated and Controlled,  Endo/Other    Renal/GU      Musculoskeletal  (+) Arthritis ,   Abdominal   Peds  Hematology   Anesthesia Other Findings   Reproductive/Obstetrics                           Anesthesia Physical Anesthesia Plan  ASA: II  Anesthesia Plan: Spinal   Post-op Pain Management:    Induction:   Airway Management Planned: Natural Airway, Nasal Cannula and Simple Face Mask  Additional Equipment: None  Intra-op Plan: Utilization Of Total Body Hypothermia per surgeon request  Post-operative Plan:   Informed Consent: I have reviewed the patients History and Physical, chart, labs and discussed the procedure including the risks, benefits and alternatives for the proposed anesthesia with the patient or authorized representative who has indicated his/her understanding and acceptance.   Dental advisory given  Plan Discussed with: CRNA and Surgeon  Anesthesia Plan Comments:       Anesthesia Quick Evaluation

## 2015-11-03 NOTE — Progress Notes (Signed)
Patient arrived from PACU to the unit this hour. Alert, oriented and can make needs known. Pain is managed with PRN per PACU RN. Patient currently in [facility] and a ted holes on right leg. Will continue to monitor.

## 2015-11-03 NOTE — Anesthesia Postprocedure Evaluation (Signed)
Anesthesia Post Note  Patient: Mia Peterson  Procedure(s) Performed: Procedure(s) (LRB): RIGHT TOTAL KNEE ARTHROPLASTY (Right)  Patient location during evaluation: PACU Anesthesia Type: Spinal Level of consciousness: awake Pain management: pain level controlled Vital Signs Assessment: post-procedure vital signs reviewed and stable Respiratory status: spontaneous breathing Cardiovascular status: stable Postop Assessment: spinal receding and no signs of nausea or vomiting Anesthetic complications: no    Last Vitals:  Vitals:   11/03/15 1334 11/03/15 1350  BP: 133/78 121/70  Pulse: (!) 54 (!) 53  Resp: 18 15  Temp:      Last Pain:  Vitals:   11/03/15 1331  TempSrc:   PainSc: 3                  Kolby Myung

## 2015-11-03 NOTE — Brief Op Note (Signed)
11/03/2015  1:15 PM  PATIENT:  Mia Peterson  70 y.o. female  PRE-OPERATIVE DIAGNOSIS:  Osteoarthritis right knee  POST-OPERATIVE DIAGNOSIS:  Osteoarthritis right knee  PROCEDURE:  Procedure(s): RIGHT TOTAL KNEE ARTHROPLASTY (Right)  SURGEON:  Surgeon(s) and Role:    * Dorna Leitz, MD - Primary  PHYSICIAN ASSISTANT:   ASSISTANTS: bethune   ANESTHESIA:   spinal  EBL:  Total I/O In: 1400 [I.V.:1400] Out: 215 [Urine:200; Blood:15]  BLOOD ADMINISTERED:none  DRAINS: none   LOCAL MEDICATIONS USED:  MARCAINE    and OTHER experel  SPECIMEN:  No Specimen  DISPOSITION OF SPECIMEN:  N/A  COUNTS:  YES  TOURNIQUET:   Total Tourniquet Time Documented: Thigh (Right) - 55 minutes Total: Thigh (Right) - 55 minutes   DICTATION: .Other Dictation: Dictation Number [id number]  PLAN OF CARE: Admit to inpatient   PATIENT DISPOSITION:  PACU - hemodynamically stable.   Delay start of Pharmacological VTE agent (>24hrs) due to surgical blood loss or risk of bleeding: no

## 2015-11-03 NOTE — Discharge Instructions (Signed)

## 2015-11-03 NOTE — Progress Notes (Signed)
Orthopedic Tech Progress Note Patient Details:  Mia Peterson [birth year] [id number]  CPM Right Knee CPM Right Knee: On Right Knee Flexion (Degrees): 90 Right Knee Extension (Degrees): 0 Additional Comments: trapeze bar patient helper   Hildred Priest 11/03/2015, 1:31 PM Viewed order from doctor's order list

## 2015-11-03 NOTE — Evaluation (Signed)
Physical Therapy Evaluation Patient Details Name: Mia Peterson MRN: [id number] DOB: Apr 17, 1945 Today's Date: 11/03/2015   History of Present Illness  Pt is a 70 y/o female s/p R TKA. PMH including but not limited to vertigo and closed head injury when she was approximately 70 yrs old.  Clinical Impression  Pt presented supine in bed with HOB elevated, R LE in CPM, awake and eager to participate in therapy session. Prior to admission, pt reported being independent with functional mobility and exercising six days a week. Pt moving well during evaluation session, and ambulated approximately 10 ft with min guard. Pt would continue to benefit from skilled physical therapy services at this time while admitted and after d/c to address her below listed limitations in order to improve her overall safety and independence with functional mobility.     Follow Up Recommendations Home health PT;Supervision for mobility/OOB    Equipment Recommendations  None recommended by PT;Other (comment) (pt reported having all necessary DME at home)    Recommendations for Other Services       Precautions / Restrictions Precautions Precautions: None Restrictions Weight Bearing Restrictions: Yes RLE Weight Bearing: Weight bearing as tolerated      Mobility  Bed Mobility Overal bed mobility: Needs Assistance Bed Mobility: Supine to Sit     Supine to sit: Min guard;HOB elevated     General bed mobility comments: pt required increased time and use of bedrails  Transfers Overall transfer level: Needs assistance Equipment used: Rolling walker (2 wheeled) Transfers: Sit to/from Stand Sit to Stand: Min guard         General transfer comment: pt required increased time and VC'ing for bilateral hand positioning  Ambulation/Gait Ambulation/Gait assistance: Min guard Ambulation Distance (Feet): 10 Feet Assistive device: Rolling walker (2 wheeled) Gait Pattern/deviations: Step-to pattern;Decreased  step length - left;Decreased stance time - right;Decreased weight shift to right Gait velocity: decreased Gait velocity interpretation: Below normal speed for age/gender General Gait Details: pt required VC'ing for sequencing with RW  Stairs            Wheelchair Mobility    Modified Rankin (Stroke Patients Only)       Balance Overall balance assessment: Needs assistance Sitting-balance support: Feet supported;No upper extremity supported Sitting balance-Leahy Scale: Good     Standing balance support: During functional activity;Bilateral upper extremity supported Standing balance-Leahy Scale: Poor                               Pertinent Vitals/Pain Pain Assessment: 0-10 Pain Score: 3  Pain Location: R thigh Pain Descriptors / Indicators: Sore;Guarding Pain Intervention(s): Monitored during session;Repositioned;Ice applied    Home Living Family/patient expects to be discharged to:: Private residence Living Arrangements: Spouse/significant other Available Help at Discharge: Family;Available 24 hours/day Type of Home: House Home Access: Stairs to enter Entrance Stairs-Rails: None Entrance Stairs-Number of Steps: 1 Home Layout: One level Home Equipment: Walker - 2 wheels;Bedside commode;Cane - single point      Prior Function Level of Independence: Independent         Comments: pt stated that she is very active and goes to the gym or participates in water aerobics classes 6 days out of the week     Hand Dominance        Extremity/Trunk Assessment   Upper Extremity Assessment: Defer to OT evaluation           Lower Extremity Assessment: LLE deficits/detail  LLE Deficits / Details: Pt with decreased strength and ROM limitations secondary to post-op.  Cervical / Trunk Assessment: Normal  Communication   Communication: No difficulties  Cognition Arousal/Alertness: Awake/alert Behavior During Therapy: WFL for tasks  assessed/performed Overall Cognitive Status: Within Functional Limits for tasks assessed                      General Comments      Exercises        Assessment/Plan    PT Assessment Patient needs continued PT services  PT Diagnosis Difficulty walking   PT Problem List Decreased strength;Decreased range of motion;Decreased activity tolerance;Decreased balance;Decreased mobility;Decreased coordination;Decreased knowledge of use of DME;Pain  PT Treatment Interventions DME instruction;Gait training;Stair training;Functional mobility training;Therapeutic activities;Therapeutic exercise;Balance training;Neuromuscular re-education;Patient/family education   PT Goals (Current goals can be found in the Care Plan section) Acute Rehab PT Goals Patient Stated Goal: return home PT Goal Formulation: With patient Time For Goal Achievement: 11/10/15 Potential to Achieve Goals: Good    Frequency 7X/week   Barriers to discharge        Co-evaluation               End of Session Equipment Utilized During Treatment: Gait belt Activity Tolerance: Patient limited by pain;Patient limited by fatigue Patient left: in chair;with call bell/phone within reach Nurse Communication: Mobility status         Time: 7829-5621 PT Time Calculation (min) (ACUTE ONLY): 18 min   Charges:   PT Evaluation $PT Eval Low Complexity: 1 Procedure     PT G CodesAlessandra Bevels Teige Rountree 11/03/2015, 5:10 PM Deborah Chalk, PT, DPT [phone]

## 2015-11-03 NOTE — Anesthesia Procedure Notes (Addendum)
Spinal  Patient location during procedure: OR Staffing Anesthesiologist: Breindel Collier Performed: anesthesiologist  Preanesthetic Checklist Completed: patient identified, surgical consent, pre-op evaluation, timeout performed, IV checked, risks and benefits discussed and monitors and equipment checked Spinal Block Patient position: sitting Prep: Betadine Patient monitoring: cardiac monitor, continuous pulse ox and blood pressure Approach: midline Injection technique: single-shot Needle Needle type: Pencan  Needle gauge: 24 G Needle length: 9 cm Assessment Sensory level: T6 Additional Notes Functioning IV was confirmed and monitors were applied. Sterile prep and drape, including hand hygiene and sterile gloves were used. The patient was positioned and the spine was prepped. The skin was anesthetized with lidocaine.  Free flow of clear CSF was obtained prior to injecting local anesthetic into the CSF.  The spinal needle aspirated freely following injection.  The needle was carefully withdrawn.  The patient tolerated the procedure well.

## 2015-11-03 NOTE — Transfer of Care (Signed)
Immediate Anesthesia Transfer of Care Note  Patient: Mia Peterson  Procedure(s) Performed: Procedure(s): RIGHT TOTAL KNEE ARTHROPLASTY (Right)  Patient Location: PACU  Anesthesia Type:Spinal  Level of Consciousness: awake, alert  and patient cooperative  Airway & Oxygen Therapy: Patient Spontanous Breathing and Patient connected to nasal cannula oxygen  Post-op Assessment: Report given to RN, Post -op Vital signs reviewed and stable, Patient moving all extremities X 4 and Patient able to stick tongue midline  Post vital signs: Reviewed and stable  Last Vitals:  Vitals:   11/03/15 0745  BP: (!) 149/86  Pulse: 68  Resp: 20  Temp: 37 C    Last Pain:  Vitals:   11/03/15 0745  TempSrc: Oral         Complications: No apparent anesthesia complications

## 2015-11-04 LAB — BASIC METABOLIC PANEL
Anion gap: 8 (ref 5–15)
BUN: 7 mg/dL (ref 6–20)
CHLORIDE: 100 mmol/L — AB (ref 101–111)
CO2: 24 mmol/L (ref 22–32)
CREATININE: 0.64 mg/dL (ref 0.44–1.00)
Calcium: 8.6 mg/dL — ABNORMAL LOW (ref 8.9–10.3)
GFR calc non Af Amer: >60 mL/min (ref >60)
Glucose, Bld: 175 mg/dL — ABNORMAL HIGH (ref 65–99)
Potassium: 4 mmol/L (ref 3.5–5.1)
Sodium: 132 mmol/L — ABNORMAL LOW (ref 135–145)

## 2015-11-04 LAB — CBC
HEMATOCRIT: 34.4 % — AB (ref 36.0–46.0)
HEMOGLOBIN: 11.2 g/dL — AB (ref 12.0–15.0)
MCH: 28.9 pg (ref 26.0–34.0)
MCHC: 32.6 g/dL (ref 30.0–36.0)
MCV: 88.9 fL (ref 78.0–100.0)
Platelets: 244 10*3/uL (ref 150–400)
RBC: 3.87 MIL/uL (ref 3.87–5.11)
RDW: 13.8 % (ref 11.5–15.5)
WBC: 10 10*3/uL (ref 4.0–10.5)

## 2015-11-04 NOTE — Progress Notes (Signed)
Physical Therapy Treatment Patient Details Name: Mia Peterson MRN: [id number] DOB: 1945-10-28 Today's Date: 11/04/2015    History of Present Illness Pt is a 70 y/o female s/p R TKA. PMH including but not limited to vertigo and closed head injury when she was approximately 69 yrs old.    PT Comments    Pt presented sitting OOB in recliner when PT entered room. Pt making good progress towards achieving her functional goals and successfully completed stair training during session. However, pt presenting now with no active DF ROM on R. Pt reports sensation is intact; however, she can only PF R ankle and move her toes. This presents as footdrop on R LE during gait.   Pt would continue to benefit from skilled physical therapy services at this time while admitted and after d/c to address her limitations in order to improve her overall safety and independence with functional mobility.   Follow Up Recommendations  Home health PT;Supervision for mobility/OOB     Equipment Recommendations  None recommended by PT    Recommendations for Other Services       Precautions / Restrictions Precautions Precautions: Knee Precaution Comments: PT reviewed positioning of LE following TKA with pt Restrictions Weight Bearing Restrictions: Yes RLE Weight Bearing: Weight bearing as tolerated    Mobility  Bed Mobility Overal bed mobility: Needs Assistance Bed Mobility: Sit to Supine       Sit to supine: Min guard   General bed mobility comments: pt required increased time and use of bedrails  Transfers Overall transfer level: Needs assistance Equipment used: Rolling walker (2 wheeled) Transfers: Sit to/from Stand Sit to Stand: Min guard         General transfer comment: pt required increased time to complete  Ambulation/Gait Ambulation/Gait assistance: Min guard Ambulation Distance (Feet): 200 Feet Assistive device: Rolling walker (2 wheeled) Gait Pattern/deviations: Step-through  pattern;Decreased step length - left;Decreased stance time - right;Decreased weight shift to right Gait velocity: decreased Gait velocity interpretation: Below normal speed for age/gender General Gait Details: pt also now with no active DF ROM on R LE therefore presenting with footdrop on R during gait   Stairs Stairs: Yes Stairs assistance: Min guard Stair Management: No rails;Backwards;With walker Number of Stairs: 1 General stair comments: pt ascended with L LE leading and descended with R LE leading  Wheelchair Mobility    Modified Rankin (Stroke Patients Only)       Balance Overall balance assessment: Needs assistance Sitting-balance support: Feet supported;No upper extremity supported Sitting balance-Leahy Scale: Good     Standing balance support: During functional activity;Bilateral upper extremity supported Standing balance-Leahy Scale: Poor                      Cognition Arousal/Alertness: Awake/alert Behavior During Therapy: WFL for tasks assessed/performed Overall Cognitive Status: Within Functional Limits for tasks assessed                      Exercises Total Joint Exercises Goniometric ROM: Flexion = 85 degrees, Extension = lacking 20 degrees to neutral; measured in sitting    General Comments        Pertinent Vitals/Pain Pain Assessment: 0-10 Pain Score: 4  Pain Location: R knee Pain Descriptors / Indicators: Grimacing;Guarding Pain Intervention(s): Monitored during session;Repositioned;Ice applied    Home Living                      Prior Function  PT Goals (current goals can now be found in the care plan section) Acute Rehab PT Goals Patient Stated Goal: return home PT Goal Formulation: With patient Time For Goal Achievement: 11/10/15 Potential to Achieve Goals: Good Progress towards PT goals: Progressing toward goals    Frequency  7X/week    PT Plan Current plan remains appropriate     Co-evaluation             End of Session Equipment Utilized During Treatment: Gait belt Activity Tolerance: Patient limited by pain;Other (comment) (pt limited by nausea) Patient left: in bed;in CPM;with call bell/phone within reach     Time: 1135-1156 PT Time Calculation (min) (ACUTE ONLY): 21 min  Charges:  $Gait Training: 8-22 mins                    G Codes:      Mia Peterson 11-05-15, 1:27 PM Mia Peterson, PT, DPT [phone]

## 2015-11-04 NOTE — Plan of Care (Signed)
Problem: Pain Managment: Goal: General experience of comfort will improve Outcome: Progressing Medicated once for pain this shift  Problem: Physical Regulation: Goal: Will remain free from infection Outcome: Progressing No s/s of infection noted, VS WNL  Problem: Tissue Perfusion: Goal: Risk factors for ineffective tissue perfusion will decrease Outcome: Progressing No s/s of dvt noted  Problem: Activity: Goal: Risk for activity intolerance will decrease Outcome: Progressing Ambulated to BR and to [facility], was up in the chair for 2 hours and tolerated well  Problem: Bowel/Gastric: Goal: Will not experience complications related to bowel motility Outcome: Progressing No bowel issues noted

## 2015-11-04 NOTE — Progress Notes (Signed)
Orthopedic Tech Progress Note Patient Details:  Mia Peterson June 20, 1945 [id number] Put patient in [place] and adjusted machine length so knee was in full extension. CPM Right Knee CPM Right Knee: On Right Knee Flexion (Degrees): 90 Right Knee Extension (Degrees): 0 Additional Comments: Adjusted machine length   [name] 11/04/2015, 6:39 PM

## 2015-11-04 NOTE — Care Management Note (Signed)
Case Management Note  Patient Details  Name: Mia Peterson MRN: [id number] Date of Birth: Feb 19, 1946  Subjective/Objective:  70 yo F s/p R TKA                  Action/Plan: PT is recommeding HHPT   Expected Discharge Date:  11/05/15               Expected Discharge Plan:  [place]  In-House Referral:     Discharge planning Services  CM Consult  Post Acute Care Choice:    Choice offered to:  Patient  DME Arranged:    DME Agency:     HH Arranged:  PT [place]:  [place]  Status of Service:  Completed, signed off  If discussed at [place] of Stay Meetings, dates discussed:    Additional Comments: met with pt at bedside. She plans to return home with the support of her husband. She has a RW and a 3-in-1 BSC. Provided pt with a list of [place] agencies. She prefers to use [facility]. Contacted Jamaine at [facility] for [facility] referral.  Norina Buzzard, RN 11/04/2015, 2:15 PM

## 2015-11-04 NOTE — Progress Notes (Signed)
Physical Therapy Treatment Patient Details Name: Mia Peterson MRN: [id number] DOB: 1945-09-10 Today's Date: 11/04/2015    History of Present Illness Pt is a 70 y/o female s/p R TKA. PMH including but not limited to vertigo and closed head injury when she was approximately 70 yrs old.    PT Comments    Pt presented supine in bed with HOB elevated, R LE in CPM, awake and willing to participate in therapy session. Pt making great progress towards achieving her functional goals. However, pt still demonstrating footdrop on R during ambulation. Still no AROM R ankle DF at this time. Pt would continue to benefit from skilled physical therapy services at this time while admitted and after d/c to address her limitations in order to improve her overall safety and independence with functional mobility.   Follow Up Recommendations  Home health PT;Supervision for mobility/OOB     Equipment Recommendations  None recommended by PT    Recommendations for Other Services       Precautions / Restrictions Precautions Precautions: Knee Precaution Comments: PT reviewed positioning of LE following TKA with pt Restrictions Weight Bearing Restrictions: Yes RLE Weight Bearing: Weight bearing as tolerated    Mobility  Bed Mobility Overal bed mobility: Needs Assistance Bed Mobility: Sit to Supine;Supine to Sit     Supine to sit: Supervision;HOB elevated Sit to supine: Supervision   General bed mobility comments: pt required increased time and use of bedrails  Transfers Overall transfer level: Needs assistance Equipment used: Rolling walker (2 wheeled) Transfers: Sit to/from Stand Sit to Stand: Min guard         General transfer comment: pt required increased time to complete  Ambulation/Gait Ambulation/Gait assistance: Min guard Ambulation Distance (Feet): 800 Feet Assistive device: Rolling walker (2 wheeled) Gait Pattern/deviations: Step-through pattern;Decreased step length -  left;Decreased stance time - right;Decreased weight shift to right Gait velocity: decreased Gait velocity interpretation: Below normal speed for age/gender General Gait Details: pt still with no active DF ROM on R LE therefore presenting with footdrop on R during gait   Stairs Stairs: Yes Stairs assistance: Min guard Stair Management: No rails;Backwards;With walker Number of Stairs: 1 General stair comments: pt ascended with L LE leading and descended with R LE leading  Wheelchair Mobility    Modified Rankin (Stroke Patients Only)       Balance Overall balance assessment: Needs assistance Sitting-balance support: Feet supported;No upper extremity supported Sitting balance-Leahy Scale: Good     Standing balance support: During functional activity;No upper extremity supported Standing balance-Leahy Scale: Fair                      Cognition Arousal/Alertness: Awake/alert Behavior During Therapy: WFL for tasks assessed/performed Overall Cognitive Status: Within Functional Limits for tasks assessed                      Exercises Total Joint Exercises Goniometric ROM: Flexion = 85 degrees, Extension = lacking 20 degrees to neutral; measured in sitting    General Comments        Pertinent Vitals/Pain Pain Assessment: No/denies pain Pain Score: 4  Pain Location: R knee Pain Descriptors / Indicators: Grimacing;Guarding Pain Intervention(s): Monitored during session    Home Living                      Prior Function            PT Goals (current goals can now  be found in the care plan section) Acute Rehab PT Goals Patient Stated Goal: return home PT Goal Formulation: With patient Time For Goal Achievement: 11/10/15 Potential to Achieve Goals: Good Progress towards PT goals: Progressing toward goals    Frequency  7X/week    PT Plan Current plan remains appropriate    Co-evaluation             End of Session Equipment  Utilized During Treatment: Gait belt Activity Tolerance: Patient tolerated treatment well Patient left: in bed;with call bell/phone within reach     Time: 6962-9528 PT Time Calculation (min) (ACUTE ONLY): 33 min  Charges:  $Gait Training: 23-37 mins                    G CodesAlessandra Peterson Mia Peterson 15-Nov-2015, 4:51 PM Deborah Chalk, PT, DPT [phone]

## 2015-11-04 NOTE — Progress Notes (Signed)
Subjective: 1 Day Post-Op Procedure(s) (LRB): RIGHT TOTAL KNEE ARTHROPLASTY (Right)  Activity level:  wbat Diet tolerance:  ok Voiding:  ok Patient reports pain as mild.    Objective: Vital signs in last 24 hours: Temp:  [97 F (36.1 C)-99.1 F (37.3 C)] 97.9 F (36.6 C) (09/09 0418) Pulse Rate:  [45-73] 58 (09/09 0418) Resp:  [11-18] 16 (09/08 1439) BP: (120-141)/(70-91) 132/76 (09/09 0418) SpO2:  [98 %-100 %] 98 % (09/09 0418)  Labs:  Recent Labs  11/04/15 0338  HGB 11.2*    Recent Labs  11/04/15 0338  WBC 10.0  RBC 3.87  HCT 34.4*  PLT 244    Recent Labs  11/04/15 0338  NA 132*  K 4.0  CL 100*  CO2 24  BUN 7  CREATININE 0.64  GLUCOSE 175*  CALCIUM 8.6*   No results for input(s): LABPT, INR in the last 72 hours.  Physical Exam:  Neurologically intact ABD soft Neurovascular intact Sensation intact distally Intact pulses distally Dorsiflexion/Plantar flexion intact Incision: dressing C/D/I and no drainage No cellulitis present Compartment soft  Assessment/Plan:  1 Day Post-Op Procedure(s) (LRB): RIGHT TOTAL KNEE ARTHROPLASTY (Right) Advance diet Up with therapy Plan for discharge tomorrow Discharge home with home health if doing well and cleared by PT. Continue on ASA 325mg  for DVT prevention. Follow up in office 2 weeks post op.    Addalynne Golding, Larwance Sachs 11/04/2015, 8:00 AM

## 2015-11-04 NOTE — Progress Notes (Signed)
Pt continues to have foot drop on R. She has full sensation in foot and is able to extend her foot but not flex it. Pillow placed at foot to prevent foot drop while sleeping.

## 2015-11-05 LAB — CBC
HCT: 31.8 % — ABNORMAL LOW (ref 36.0–46.0)
Hemoglobin: 10.4 g/dL — ABNORMAL LOW (ref 12.0–15.0)
MCH: 29 pg (ref 26.0–34.0)
MCHC: 32.7 g/dL (ref 30.0–36.0)
MCV: 88.6 fL (ref 78.0–100.0)
PLATELETS: 239 10*3/uL (ref 150–400)
RBC: 3.59 MIL/uL — AB (ref 3.87–5.11)
RDW: 14.1 % (ref 11.5–15.5)
WBC: 15.7 10*3/uL — ABNORMAL HIGH (ref 4.0–10.5)

## 2015-11-05 NOTE — Progress Notes (Signed)
Orthopedic Tech Progress Note Patient Details:  Mia Peterson 1945/07/17 [id number]  [name], female   DOB: [birth year], 70 y.o.   MRN: [id number] Came to put pt in cpm. Pt was already up in chair. Will call when ready to get in cpm.  Karolee Stamps 11/05/2015, 6:09 AM

## 2015-11-05 NOTE — Progress Notes (Signed)
Physical Therapy Treatment Patient Details Name: Mia Peterson MRN: [id number] DOB: November 22, 1945 Today's Date: 11/05/2015    History of Present Illness Pt is a 70 y/o female s/p R TKA. PMH including but not limited to vertigo and closed head injury when she was approximately 70 yrs old.    PT Comments    Despite increased pain today, pt was able to progress both gait and exercises and show proficiency on stairs to enter home.  She no longer has right foot drop.  She is safe to d/c home when MD deems appropriate.  PT will continue to follow to progress if she stays.  Pt is progressing well enough that she may be able to transition straight to OP PT.     Follow Up Recommendations  Supervision for mobility/OOB;Outpatient PT     Equipment Recommendations  None recommended by PT    Recommendations for Other Services   NA     Precautions / Restrictions Precautions Precautions: Knee Precaution Booklet Issued: Yes (comment) Precaution Comments: knee hanout given, no pillow rule reinforced Required Braces or Orthoses: Knee Immobilizer - Right Restrictions RLE Weight Bearing: Weight bearing as tolerated    Mobility  Bed Mobility Overal bed mobility: Modified Independent             General bed mobility comments: Pt easily got up over the side of the bed.  Transfers Overall transfer level: Needs assistance Equipment used: Rolling walker (2 wheeled) Transfers: Sit to/from Stand Sit to Stand: Supervision         General transfer comment: supervision for safety during transitions and verbal cues for safe hand placement.   Ambulation/Gait Ambulation/Gait assistance: Supervision Ambulation Distance (Feet): 1030 Feet Assistive device: Rolling walker (2 wheeled) Gait Pattern/deviations: Step-through pattern;Antalgic Gait velocity: decreased Gait velocity interpretation: Below normal speed for age/gender General Gait Details: Pt does have R DF today, increased pain with gait,  however, it did not seem to limit her safe speed, mobility, and distance.    Stairs Stairs: Yes Stairs assistance: Supervision Stair Management: No rails;Backwards;With walker Number of Stairs: 1 General stair comments: Pt able to demonstrate safe pattern for ascending and descending her step with RW. She was able to teach back what she learned yesterday         Balance Overall balance assessment: Needs assistance Sitting-balance support: Feet supported;No upper extremity supported Sitting balance-Leahy Scale: Good     Standing balance support: Single extremity supported;Bilateral upper extremity supported;No upper extremity supported Standing balance-Leahy Scale: Fair                      Cognition Arousal/Alertness: Awake/alert Behavior During Therapy: WFL for tasks assessed/performed Overall Cognitive Status: Within Functional Limits for tasks assessed                      Exercises Total Joint Exercises Ankle Circles/Pumps: AROM;Both;20 reps Quad Sets: AROM;Right;10 reps Towel Squeeze: AROM;Both;10 reps Short Arc QuadBarbaraann Boys;Right;5 reps Heel Slides: AAROM;Right;10 reps Hip ABduction/ADduction: AROM;Right;5 reps Straight Leg Raises: AROM;Right;5 reps Long Arc Quad: AROM;Right;5 reps Knee Flexion: AROM;Right;AAROM;5 reps Goniometric ROM: 5-90 degrees AAROM in supine        Pertinent Vitals/Pain Pain Assessment: 0-10 Pain Score: 5  Pain Location: right knee Pain Descriptors / Indicators: Aching;Burning;Grimacing;Guarding Pain Intervention(s): Limited activity within patient's tolerance;Monitored during session;Repositioned;Patient requesting pain meds-RN notified           PT Goals (current goals can now be found in the care  plan section) Acute Rehab PT Goals Patient Stated Goal: to go home, decrease pain.  Progress towards PT goals: Progressing toward goals    Frequency  7X/week    PT Plan Discharge plan needs to be updated        End of Session   Activity Tolerance: Patient limited by pain Patient left: in chair;with call bell/phone within reach     Time: 1026-1119 PT Time Calculation (min) (ACUTE ONLY): 53 min  Charges:  $Gait Training: 23-37 mins $Therapeutic Exercise: 23-37 mins                     Mia Peterson, PT, DPT [phone]   11/05/2015, 11:28 AM

## 2015-11-05 NOTE — Discharge Summary (Signed)
[name] MRN: [id number] DOB/AGE: 70-Feb-1947 70 y.o.  Admit date: 11/03/2015 Discharge date: 11/05/2015  Admission Diagnoses:  Active Problems:   Primary osteoarthritis of right knee   Discharge Diagnoses:  Same  Past Medical History:  Diagnosis Date  . Arthritis    right knee and foot- OSteo   . Asthma    treated in 20's and 30's  . 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 Complication of anesthesia    very slow to awaken   . GERD (gastroesophageal reflux disease)    prn Prilosec  . Head injury, closed    10/26/15- years ago - 30ish  . Shortness of breath dyspnea    with exertion  . Vaso vagal episode    passed out 2 times, close to passing out 3 times  . Vertigo    was treated with rehab March and April 2017, does exercises if she begins to have symptoms    Surgeries: Procedure(s): RIGHT TOTAL KNEE ARTHROPLASTY on 11/03/2015   Consultants:   Discharged Condition: Improved  Hospital Course: Mia Peterson is an 70 y.o. female who was admitted 11/03/2015 for operative treatment of<principal problem not specified>. Patient has severe unremitting pain that affects sleep, daily activities, and work/hobbies. After pre-op clearance the patient was taken to the operating room on 11/03/2015 and underwent  Procedure(s): RIGHT TOTAL KNEE ARTHROPLASTY.    Patient was given perioperative antibiotics: Anti-infectives    Start     Dose/Rate Route Frequency Ordered Stop   11/03/15 1530  ceFAZolin (ANCEF) IVPB 2g/100 mL premix     2 g 200 mL/hr over 30 Minutes Intravenous Every 6 hours 11/03/15 1502 11/03/15 2345   11/03/15 0900  ceFAZolin (ANCEF) IVPB 2g/100 mL premix     2 g 200 mL/hr over 30 Minutes Intravenous To [facility]Stay [facility] 11/02/15 1158 11/03/15 1022       Patient was given sequential compression devices, early ambulation, and chemoprophylaxis to prevent DVT.  Patient benefited maximally from hospital stay and there were no complications.     Recent vital signs: Patient Vitals for the past 24 hrs:  BP Temp Temp src Pulse Resp SpO2  11/05/15 0531 131/83 98.2 F (36.8 C) Oral 68 - 99 %  11/05/15 0200 - - - - 16 -  11/04/15 2108 128/68 98 F (36.7 C) Oral 65 - 100 %  11/04/15 1420 136/74 98 F (36.7 C) - 62 16 99 %     Recent laboratory studies:  Recent Labs  11/04/15 0338 11/05/15 0425  WBC 10.0 15.7*  HGB 11.2* 10.4*  HCT 34.4* 31.8*  PLT 244 239  NA 132*  --   K 4.0  --   CL 100*  --   CO2 24  --   BUN 7  --   CREATININE 0.64  --   GLUCOSE 175*  --   CALCIUM 8.6*  --      Discharge Medications:     Medication List    STOP taking these medications   OVER THE COUNTER MEDICATION     TAKE these medications   aspirin EC 325 MG tablet Take 1 tablet (325 mg total) by mouth 2 (two) times daily after a meal. Take x 1 month post op to decrease risk of blood clots.   CALCIUM 600 + D PO Take 2 tablets by mouth daily.   cycloSPORINE 0.05 % ophthalmic emulsion Commonly known as:  RESTASIS Place 1 drop into both eyes 2 (two)  times daily.   Flax Seed Oil 1000 MG Caps Take 1,000 mg by mouth daily.   Ginger Root 550 MG Caps Take 550 mg by mouth daily.   omeprazole 20 MG capsule Commonly known as:  PRILOSEC Take 20 mg by mouth daily as needed (for acid reflux).   ONE DAILY WOMENS 50+ PO Take 1 tablet by mouth daily.   oxyCODONE-acetaminophen 5-325 MG tablet Commonly known as:  PERCOCET/ROXICET Take 1-2 tablets by mouth every 4 (four) hours as needed for severe pain.   tiZANidine 2 MG tablet Commonly known as:  ZANAFLEX Take 1 tablet (2 mg total) by mouth every 8 (eight) hours as needed for muscle spasms.   TURMERIC PO Take 400 mg by mouth daily.       Diagnostic Studies: Dg Chest 2 View  Result Date: 10/26/2015 CLINICAL DATA:  Preoperative examination prior to knee joint replacement ; remote history of smoking, asthma in young adults foot. EXAM: CHEST  2 VIEW COMPARISON:  None in PACs  FINDINGS: The lungs are well-expanded. There is no focal infiltrate. There is subtle density that projects on the left in the posterior aspect of the upper lobe. This may reflect a nodule measuring approximately 8 x 12 x 5 mm.There is no pleural effusion. The heart and pulmonary vascularity are normal. The mediastinum is normal in width. There is mild tortuosity of the descending thoracic aorta. The bony thorax exhibits no acute abnormality. IMPRESSION: No pneumonia nor CHF. Subtle nodule posteriorly in the left upper lobe. Chest CT scanning is recommended to exclude malignancy. These results will be called to the ordering clinician or representative by the Radiologist Assistant, and communication documented in the PACS or zVision Dashboard. Electronically Signed   By: David  Martinique M.D.   On: 10/26/2015 10:51   Ct Chest Wo Contrast  Result Date: 11/01/2015 CLINICAL DATA:  Possible left upper lobe nodule on recent chest x-ray EXAM: CT CHEST WITHOUT CONTRAST TECHNIQUE: Multidetector CT imaging of the chest was performed following the standard protocol without IV contrast. COMPARISON:  10/26/2015 FINDINGS: Cardiovascular: Somewhat limited due the lack of IV contrast. Aortic calcifications are seen without aneurysmal dilatation. No significant coronary calcifications are noted. Mediastinum/Nodes: The thoracic inlet is within normal limits. No significant hilar or mediastinal adenopathy is noted. Scattered calcified hilar lymph nodes are seen consistent with prior granulomatous disease. No axillary adenopathy is seen. Lungs/Pleura: The lungs are well aerated bilaterally. No focal infiltrate or sizable effusion is seen. Scattered tiny nodules are noted throughout both lungs. Some of these are calcified and some are noncalcified. Statistically these likely represent granulomas. The specific nodule seen on prior chest x-ray is calcified consistent with prior granulomatous disease. No sizable parenchymal nodule greater  than 5 mm is noted. Upper Abdomen: The visualized upper abdomen demonstrates some splenic granulomas. Additionally a hypodensity is noted within the posterior aspect of the spleen best seen on image number 136 of series 2. This may represent a focal splenic cyst or hemangioma. Musculoskeletal: Degenerative changes of the thoracic spine are seen. No suspicious bony lesions are noted. Bilateral breast implants are seen. IMPRESSION: Changes consistent with prior granulomatous disease. Some of the nodules seen bilaterally are noncalcified and measure less than 5 mm. No follow-up needed if patient is low-risk (and has no known or suspected primary neoplasm). Non-contrast chest CT can be considered in 12 months if patient is high-risk. This recommendation follows the consensus statement: Guidelines for Management of Incidental Pulmonary Nodules Detected on CT Images:From the Fleischner Society  2017; published online before print (10.1148/radiol.SG:5268862). Hypodensity within the spleen posteriorly likely representing a cyst or hemangioma. Electronically Signed   By: Inez Catalina M.D.   On: 11/01/2015 13:54    Disposition: Final discharge disposition not confirmed  Discharge Instructions    Call MD / Call 911    Complete by:  As directed   If you experience chest pain or shortness of breath, CALL 911 and be transported to the hospital emergency room.  If you develope a fever above 101 F, pus (white drainage) or increased drainage or redness at the wound, or calf pain, call your surgeon's office.   Constipation Prevention    Complete by:  As directed   Drink plenty of fluids.  Prune juice may be helpful.  You may use a stool softener, such as Colace (over the counter) 100 mg twice a day.  Use MiraLax (over the counter) for constipation as needed.   Diet - low sodium heart healthy    Complete by:  As directed   Discharge instructions    Complete by:  As directed   INSTRUCTIONS AFTER JOINT REPLACEMENT   Remove  items at home which could result in a fall. This includes throw rugs or furniture in walking pathways ICE to the affected joint every three hours while awake for 30 minutes at a time, for at least the first 3-5 days, and then as needed for pain and swelling.  Continue to use ice for pain and swelling. You may notice swelling that will progress down to the foot and ankle.  This is normal after surgery.  Elevate your leg when you are not up walking on it.   Continue to use the breathing machine you got in the hospital (incentive spirometer) which will help keep your temperature down.  It is common for your temperature to cycle up and down following surgery, especially at night when you are not up moving around and exerting yourself.  The breathing machine keeps your lungs expanded and your temperature down.   DIET:  As you were doing prior to hospitalization, we recommend a well-balanced diet.  DRESSING / WOUND CARE / SHOWERING  You may shower 3 days after surgery, but keep the wounds dry during showering.  You may use an occlusive plastic wrap (Press'n Seal for example), NO SOAKING/SUBMERGING IN THE BATHTUB.  If the bandage gets wet, change with a clean dry gauze.  If the incision gets wet, pat the wound dry with a clean towel.  ACTIVITY  Increase activity slowly as tolerated, but follow the weight bearing instructions below.   No driving for 6 weeks or until further direction given by your physician.  You cannot drive while taking narcotics.  No lifting or carrying greater than 10 lbs. until further directed by your surgeon. Avoid periods of inactivity such as sitting longer than an hour when not asleep. This helps prevent blood clots.  You may return to work once you are authorized by your doctor.     WEIGHT BEARING   Weight bearing as tolerated with assist device (walker, cane, etc) as directed, use it as long as suggested by your surgeon or therapist, typically at least 4-6  weeks.   EXERCISES  Results after joint replacement surgery are often greatly improved when you follow the exercise, range of motion and muscle strengthening exercises prescribed by your doctor. Safety measures are also important to protect the joint from further injury. Any time any of these exercises cause you to have  increased pain or swelling, decrease what you are doing until you are comfortable again and then slowly increase them. If you have problems or questions, call your caregiver or physical therapist for advice.   Rehabilitation is important following a joint replacement. After just a few days of immobilization, the muscles of the leg can become weakened and shrink (atrophy).  These exercises are designed to build up the tone and strength of the thigh and leg muscles and to improve motion. Often times heat used for twenty to thirty minutes before working out will loosen up your tissues and help with improving the range of motion but do not use heat for the first two weeks following surgery (sometimes heat can increase post-operative swelling).   These exercises can be done on a training (exercise) mat, on the floor, on a table or on a bed. Use whatever works the best and is most comfortable for you.    Use music or television while you are exercising so that the exercises are a pleasant break in your day. This will make your life better with the exercises acting as a break in your routine that you can look forward to.   Perform all exercises about fifteen times, three times per day or as directed.  You should exercise both the operative leg and the other leg as well.   Exercises include:   Quad Sets - Tighten up the muscle on the front of the thigh (Quad) and hold for 5-10 seconds.   Straight Leg Raises - With your knee straight (if you were given a brace, keep it on), lift the leg to 60 degrees, hold for 3 seconds, and slowly lower the leg.  Perform this exercise against resistance later  as your leg gets stronger.  Leg Slides: Lying on your back, slowly slide your foot toward your buttocks, bending your knee up off the floor (only go as far as is comfortable). Then slowly slide your foot back down until your leg is flat on the floor again.  Angel Wings: Lying on your back spread your legs to the side as far apart as you can without causing discomfort.  Hamstring Strength:  Lying on your back, push your heel against the floor with your leg straight by tightening up the muscles of your buttocks.  Repeat, but this time bend your knee to a comfortable angle, and push your heel against the floor.  You may put a pillow under the heel to make it more comfortable if necessary.   A rehabilitation program following joint replacement surgery can speed recovery and prevent re-injury in the future due to weakened muscles. Contact your doctor or a physical therapist for more information on knee rehabilitation.    CONSTIPATION  Constipation is defined medically as fewer than three stools per week and severe constipation as less than one stool per week.  Even if you have a regular bowel pattern at home, your normal regimen is likely to be disrupted due to multiple reasons following surgery.  Combination of anesthesia, postoperative narcotics, change in appetite and fluid intake all can affect your bowels.   YOU MUST use at least one of the following options; they are listed in order of increasing strength to get the job done.  They are all available over the counter, and you may need to use some, POSSIBLY even all of these options:    Drink plenty of fluids (prune juice may be helpful) and high fiber foods Colace 100 mg by mouth  twice a day  Senokot for constipation as directed and as needed Dulcolax (bisacodyl), take with full glass of water  Miralax (polyethylene glycol) once or twice a day as needed.  If you have tried all these things and are unable to have a bowel movement in the first 3-4  days after surgery call either your surgeon or your primary doctor.    If you experience loose stools or diarrhea, hold the medications until you stool forms back up.  If your symptoms do not get better within 1 week or if they get worse, check with your doctor.  If you experience "the worst abdominal pain ever" or develop nausea or vomiting, please contact the office immediately for further recommendations for treatment.   ITCHING:  If you experience itching with your medications, try taking only a single pain pill, or even half a pain pill at a time.  You can also use Benadryl over the counter for itching or also to help with sleep.   TED HOSE STOCKINGS:  Use stockings on both legs until for at least 2 weeks or as directed by physician office. They may be removed at night for sleeping.  MEDICATIONS:  See your medication summary on the "After Visit Summary" that nursing will review with you.  You may have some home medications which will be placed on hold until you complete the course of blood thinner medication.  It is important for you to complete the blood thinner medication as prescribed.  PRECAUTIONS:  If you experience chest pain or shortness of breath - call 911 immediately for transfer to the hospital emergency department.   If you develop a fever greater that 101 F, purulent drainage from wound, increased redness or drainage from wound, foul odor from the wound/dressing, or calf pain - CONTACT YOUR SURGEON.                                                   FOLLOW-UP APPOINTMENTS:  If you do not already have a post-op appointment, please call the office for an appointment to be seen by your surgeon.  Guidelines for how soon to be seen are listed in your "After Visit Summary", but are typically between 1-4 weeks after surgery.  OTHER INSTRUCTIONS:   Knee Replacement:  Do not place pillow under knee, focus on keeping the knee straight while resting. CPM instructions: 0-90 degrees, 2 hours  in the morning, 2 hours in the afternoon, and 2 hours in the evening. Place foam block, curve side up under heel at all times except when in CPM or when walking.  DO NOT modify, tear, cut, or change the foam block in any way.  MAKE SURE YOU:  Understand these instructions.  Get help right away if you are not doing well or get worse.    Thank you for letting us be a part of your medical care team.  It is a privilege we respect greatly.  We hope these instructions will help you stay on track for a fast and full recovery!   Increase activity slowly as tolerated    Complete by:  As directed      Follow-up Information    GRAVES,JOHN L, MD. Schedule an appointment as soon as possible for a visit in 2 weeks.   Specialty:  Orthopedic Surgery Contact information: [place]  [place] [address] [phone]            Signed: Rich Fuchs 11/05/2015, 10:16 AM

## 2015-11-05 NOTE — Progress Notes (Signed)
Subjective: 2 Days Post-Op Procedure(s) (LRB): RIGHT TOTAL KNEE ARTHROPLASTY (Right)  Activity level:  wbat Diet tolerance:  ok Voiding:  ok Patient reports pain as mild and moderate.    Objective: Vital signs in last 24 hours: Temp:  [98 F (36.7 C)-98.2 F (36.8 C)] 98.2 F (36.8 C) (09/10 0531) Pulse Rate:  [62-68] 68 (09/10 0531) Resp:  [16] 16 (09/10 0200) BP: (128-136)/(68-83) 131/83 (09/10 0531) SpO2:  [99 %-100 %] 99 % (09/10 0531)  Labs:  Recent Labs  11/04/15 0338 11/05/15 0425  HGB 11.2* 10.4*    Recent Labs  11/04/15 0338 11/05/15 0425  WBC 10.0 15.7*  RBC 3.87 3.59*  HCT 34.4* 31.8*  PLT 244 239    Recent Labs  11/04/15 0338  NA 132*  K 4.0  CL 100*  CO2 24  BUN 7  CREATININE 0.64  GLUCOSE 175*  CALCIUM 8.6*   No results for input(s): LABPT, INR in the last 72 hours.  Physical Exam:  Neurologically intact ABD soft Neurovascular intact Sensation intact distally Intact pulses distally Dorsiflexion/Plantar flexion intact Incision: dressing C/D/I and no drainage No cellulitis present Compartment soft  Assessment/Plan:  2 Days Post-Op Procedure(s) (LRB): RIGHT TOTAL KNEE ARTHROPLASTY (Right) Advance diet Up with therapy Discharge home with home health today if pain better and she clears PT. Continue on ASA 325mg  for DVT prevention. Follow up in office 2 weeks post op.  Inaki Vantine, Larwance Sachs 11/05/2015, 10:13 AM

## 2015-11-05 NOTE — Progress Notes (Addendum)
Physical Therapy Treatment Patient Details Name: Mia Peterson MRN: [id number] DOB: [birth year] Today's Date: 11/05/2015    History of Present Illness Pt is a 70 y/o female s/p R TKA. PMH including but not limited to vertigo and closed head injury when she was approximately 70 yrs old.    PT Comments    Pt was able to do just as much in her PM session this afternoon and her husband was present for education and to see how well she was mobilizing (he was a bit anxious about taking her home).  She is due to d/c this PM.   Follow Up Recommendations  Supervision for mobility/OOB;Outpatient PT     Equipment Recommendations  None recommended by PT    Recommendations for Other Services   NA     Precautions / Restrictions Precautions Precautions: Knee Precaution Comments: reviewed no pillow precaution Required Braces or Orthoses: Knee Immobilizer - Right (not used this session) Restrictions RLE Weight Bearing: Weight bearing as tolerated    Mobility  Bed Mobility               General bed mobility comments: Pt was in the bathroom when PT entered the room.   Transfers Overall transfer level: Needs assistance Equipment used: Rolling walker (2 wheeled) Transfers: Sit to/from Stand Sit to Stand: Supervision         General transfer comment: Supervision for safety, verbal cues for safe hand placement.   Ambulation/Gait Ambulation/Gait assistance: Supervision Ambulation Distance (Feet): 1030 Feet Assistive device: Rolling walker (2 wheeled) Gait Pattern/deviations: Step-through pattern;Antalgic Gait velocity: decreased Gait velocity interpretation: Below normal speed for age/gender General Gait Details: Mildly antalgic gait pattern verbal cues for good heel to toe pattern since she is not in her knee immobilizer   Stairs Stairs: Yes Stairs assistance: Supervision Stair Management: No rails;Backwards;With walker Number of Stairs: 1 General stair comments: Pt  demonstrated her stair technique to her husband.          Balance Overall balance assessment: Needs assistance Sitting-balance support: Feet supported;No upper extremity supported Sitting balance-Leahy Scale: Good     Standing balance support: No upper extremity supported Standing balance-Leahy Scale: Fair                      Cognition Arousal/Alertness: Awake/alert Behavior During Therapy: WFL for tasks assessed/performed Overall Cognitive Status: Within Functional Limits for tasks assessed                      Exercises Total Joint Exercises Ankle Circles/Pumps: AROM;Both;20 reps Short Arc Quad: AROM;Right;10 reps Hip ABduction/ADduction: AROM;Right;10 reps Straight Leg Raises: AAROM;Right;10 reps        Pertinent Vitals/Pain Pain Assessment: Faces Pain Score: 4  Pain Location: right knee Pain Descriptors / Indicators: Grimacing;Guarding Pain Intervention(s): Limited activity within patient's tolerance;Repositioned;Monitored during session           PT Goals (current goals can now be found in the care plan section) Acute Rehab PT Goals Patient Stated Goal: to go home, decrease pain.  Progress towards PT goals: Progressing toward goals    Frequency  7X/week    PT Plan Current plan remains appropriate       End of Session Equipment Utilized During Treatment: Gait belt Activity Tolerance: Patient limited by pain Patient left: in chair;with call bell/phone within reach     Time: 1432-1504 PT Time Calculation (min) (ACUTE ONLY): 32 min  Charges:  $Gait Training: 8-22 mins $Therapeutic Exercise:  8-22 mins                      Aamna Mallozzi B. Wynn Kernes, PT, DPT [phone]   11/05/2015, 3:28 PM

## 2015-11-06 DIAGNOSIS — K219 Gastro-esophageal reflux disease without esophagitis: Secondary | ICD-10-CM | POA: Diagnosis not present

## 2015-11-06 DIAGNOSIS — Z96651 Presence of right artificial knee joint: Secondary | ICD-10-CM | POA: Diagnosis not present

## 2015-11-06 DIAGNOSIS — R3915 Urgency of urination: Secondary | ICD-10-CM | POA: Diagnosis not present

## 2015-11-06 DIAGNOSIS — Z471 Aftercare following joint replacement surgery: Secondary | ICD-10-CM | POA: Diagnosis not present

## 2015-11-06 DIAGNOSIS — M6281 Muscle weakness (generalized): Secondary | ICD-10-CM | POA: Diagnosis not present

## 2015-11-06 DIAGNOSIS — F329 Major depressive disorder, single episode, unspecified: Secondary | ICD-10-CM | POA: Diagnosis not present

## 2015-11-06 NOTE — Op Note (Signed)
NAME:  Mia Peterson, Mia Peterson NO.:  [id number]  MEDICAL RECORD NO.:  [id number]  LOCATION:                                 FACILITY:  PHYSICIAN:  Alta Corning, M.D.   DATE OF BIRTH:  28-Dec-1945  DATE OF PROCEDURE: DATE OF DISCHARGE:                              OPERATIVE REPORT   IDENTIFICATION:  She is a 70 year old female in Orthopedic Service today.  PREOPERATIVE DIAGNOSIS:  End-stage degenerative joint disease, right knee.  POSTOPERATIVE DIAGNOSIS:  End-stage degenerative joint disease, right knee.  PROCEDURES:  Right total knee replacement with Attune system size 6 femur, size 6 tibia, 6 mm bridging bearing, and a 35 mm all-polyethylene patella.  SURGEON:  Alta Corning, M.D.  ASSISTANT:  Gary Fleet, P.A.  ANESTHESIA:  Spinal.  BRIEF HISTORY:  Mia Peterson is a 70 year old female with a long history significant for complaints of bilateral knee pain.  X-ray showed bone-on- bone change.  She has been treated with injection therapy, viscous supplementation for several years, activity modification.  After failure of all conservative care, x-ray showed bone-on-bone change, the patient was having severe night time pain and light activity pain, she was taken to the operating room for right total knee replacement.  DESCRIPTION OF PROCEDURE:  The patient was taken to the operating room. After adequate anesthesia was obtained with spinal anesthetic, the patient was placed supine on the operating table.  The right leg was prepped and draped in the sterile fashion.  Following this, the leg was exsanguinated.  Blood pressure tourniquet was inflated to 300 mmHg. Following this, a midline incision was made, subcutaneous tissue down all the extensor mechanism and medial parapatellar arthrotomy was undertaken.  Following this, medial and lateral meniscus removed, retropatellar fat pad, synovium on the anterior aspect of the femur, and anterior posterior  cruciates.  Attention was then turned to the femur when intramedullary pilot hole was drilled followed by a half introduction of the valgus alignment guide at 5 degrees of valgus and 9 mm of distal bone of the femur was resected.  Following this, the femur sized to a 6.  Anterior and posterior cuts were made, chamfers and box. Attention was then turned to the tibia, where it was cut perpendicular to its long axis and sized to a 6, it was drilled and keeled, and trials were put in place.  Knee was put through a range of motion and excellent stability was achieved.  Attention was turned to the patella, cut down to a level of 13 mm and the holes were drilled in the patella and a 35 all poly patella was placed in the posterior surface of the patella. Once this was completed, attention was turned towards putting the trial components in and putting the knee through a range of motion, excellent stability and range of motion were achieved.  Attention was turned to removal of all trial components.  The knee was copiously and thoroughly lavaged, pulsatile lavage, irrigation and suctioned dry.  The final components were cemented in place size 6 tibia, size 6 femur, 6 mm bridging bearing trial was placed and a 35 all poly patella was placed and held with a  clamp.  All excess bone cement was removed and the cement was allowed to completely harden and once done, the trial was removed.  The knee was copiously and thoroughly lavaged.  Tourniquet was let down and all bleeders were controlled with electrocautery.  A 60 mL of Exparel, Marcaine, and saline were instilled throughout the synovial reflection for postoperative pain control and at this point, the final poly was placed size 6, knee put through a range of motion, excellent stability and range of motion were achieved.  The medial parapatellar arthrotomy was closed with 1 Vicryl running, the skin with 0 and 2-0 Vicryl, and 3-0 Monocryl subcuticular.   Benzoin and Steri-Strips were applied, sterile compressive dressing was applied, and the patient was taken to the recovery where she was noted to be in satisfactory condition.  Estimated blood loss for the procedure is minimal.     Alta Corning, M.D.   ______________________________ Alta Corning, M.D.    Corliss Skains  D:  11/03/2015  T:  11/04/2015  Job:  [id number]

## 2015-11-07 ENCOUNTER — Encounter (HOSPITAL_COMMUNITY): Payer: Self-pay | Admitting: Orthopedic Surgery

## 2015-11-08 DIAGNOSIS — M6281 Muscle weakness (generalized): Secondary | ICD-10-CM | POA: Diagnosis not present

## 2015-11-08 DIAGNOSIS — F329 Major depressive disorder, single episode, unspecified: Secondary | ICD-10-CM | POA: Diagnosis not present

## 2015-11-08 DIAGNOSIS — Z96651 Presence of right artificial knee joint: Secondary | ICD-10-CM | POA: Diagnosis not present

## 2015-11-08 DIAGNOSIS — K219 Gastro-esophageal reflux disease without esophagitis: Secondary | ICD-10-CM | POA: Diagnosis not present

## 2015-11-08 DIAGNOSIS — M1711 Unilateral primary osteoarthritis, right knee: Secondary | ICD-10-CM | POA: Diagnosis not present

## 2015-11-08 DIAGNOSIS — Z471 Aftercare following joint replacement surgery: Secondary | ICD-10-CM | POA: Diagnosis not present

## 2015-11-08 DIAGNOSIS — R3915 Urgency of urination: Secondary | ICD-10-CM | POA: Diagnosis not present

## 2015-11-10 DIAGNOSIS — Z471 Aftercare following joint replacement surgery: Secondary | ICD-10-CM | POA: Diagnosis not present

## 2015-11-10 DIAGNOSIS — K219 Gastro-esophageal reflux disease without esophagitis: Secondary | ICD-10-CM | POA: Diagnosis not present

## 2015-11-10 DIAGNOSIS — M6281 Muscle weakness (generalized): Secondary | ICD-10-CM | POA: Diagnosis not present

## 2015-11-10 DIAGNOSIS — R3915 Urgency of urination: Secondary | ICD-10-CM | POA: Diagnosis not present

## 2015-11-10 DIAGNOSIS — F329 Major depressive disorder, single episode, unspecified: Secondary | ICD-10-CM | POA: Diagnosis not present

## 2015-11-10 DIAGNOSIS — Z96651 Presence of right artificial knee joint: Secondary | ICD-10-CM | POA: Diagnosis not present

## 2015-11-11 DIAGNOSIS — M6281 Muscle weakness (generalized): Secondary | ICD-10-CM | POA: Diagnosis not present

## 2015-11-11 DIAGNOSIS — Z96651 Presence of right artificial knee joint: Secondary | ICD-10-CM | POA: Diagnosis not present

## 2015-11-11 DIAGNOSIS — Z471 Aftercare following joint replacement surgery: Secondary | ICD-10-CM | POA: Diagnosis not present

## 2015-11-11 DIAGNOSIS — R3915 Urgency of urination: Secondary | ICD-10-CM | POA: Diagnosis not present

## 2015-11-11 DIAGNOSIS — F329 Major depressive disorder, single episode, unspecified: Secondary | ICD-10-CM | POA: Diagnosis not present

## 2015-11-11 DIAGNOSIS — K219 Gastro-esophageal reflux disease without esophagitis: Secondary | ICD-10-CM | POA: Diagnosis not present

## 2015-11-13 DIAGNOSIS — K219 Gastro-esophageal reflux disease without esophagitis: Secondary | ICD-10-CM | POA: Diagnosis not present

## 2015-11-13 DIAGNOSIS — R3915 Urgency of urination: Secondary | ICD-10-CM | POA: Diagnosis not present

## 2015-11-13 DIAGNOSIS — M6281 Muscle weakness (generalized): Secondary | ICD-10-CM | POA: Diagnosis not present

## 2015-11-13 DIAGNOSIS — Z96651 Presence of right artificial knee joint: Secondary | ICD-10-CM | POA: Diagnosis not present

## 2015-11-13 DIAGNOSIS — Z471 Aftercare following joint replacement surgery: Secondary | ICD-10-CM | POA: Diagnosis not present

## 2015-11-13 DIAGNOSIS — F329 Major depressive disorder, single episode, unspecified: Secondary | ICD-10-CM | POA: Diagnosis not present

## 2015-11-14 DIAGNOSIS — M25661 Stiffness of right knee, not elsewhere classified: Secondary | ICD-10-CM | POA: Diagnosis not present

## 2015-11-14 DIAGNOSIS — Z96651 Presence of right artificial knee joint: Secondary | ICD-10-CM | POA: Diagnosis not present

## 2015-11-14 DIAGNOSIS — M25561 Pain in right knee: Secondary | ICD-10-CM | POA: Diagnosis not present

## 2015-11-14 DIAGNOSIS — R262 Difficulty in walking, not elsewhere classified: Secondary | ICD-10-CM | POA: Diagnosis not present

## 2015-11-16 DIAGNOSIS — M25661 Stiffness of right knee, not elsewhere classified: Secondary | ICD-10-CM | POA: Diagnosis not present

## 2015-11-16 DIAGNOSIS — Z96651 Presence of right artificial knee joint: Secondary | ICD-10-CM | POA: Diagnosis not present

## 2015-11-16 DIAGNOSIS — R262 Difficulty in walking, not elsewhere classified: Secondary | ICD-10-CM | POA: Diagnosis not present

## 2015-11-16 DIAGNOSIS — M25561 Pain in right knee: Secondary | ICD-10-CM | POA: Diagnosis not present

## 2015-11-21 DIAGNOSIS — M25561 Pain in right knee: Secondary | ICD-10-CM | POA: Diagnosis not present

## 2015-11-21 DIAGNOSIS — M25661 Stiffness of right knee, not elsewhere classified: Secondary | ICD-10-CM | POA: Diagnosis not present

## 2015-11-21 DIAGNOSIS — Z96651 Presence of right artificial knee joint: Secondary | ICD-10-CM | POA: Diagnosis not present

## 2015-11-21 DIAGNOSIS — R262 Difficulty in walking, not elsewhere classified: Secondary | ICD-10-CM | POA: Diagnosis not present

## 2015-11-21 DIAGNOSIS — M25511 Pain in right shoulder: Secondary | ICD-10-CM | POA: Diagnosis not present

## 2015-11-24 DIAGNOSIS — Z96651 Presence of right artificial knee joint: Secondary | ICD-10-CM | POA: Diagnosis not present

## 2015-11-24 DIAGNOSIS — R262 Difficulty in walking, not elsewhere classified: Secondary | ICD-10-CM | POA: Diagnosis not present

## 2015-11-24 DIAGNOSIS — M25561 Pain in right knee: Secondary | ICD-10-CM | POA: Diagnosis not present

## 2015-11-24 DIAGNOSIS — M25661 Stiffness of right knee, not elsewhere classified: Secondary | ICD-10-CM | POA: Diagnosis not present

## 2015-11-28 DIAGNOSIS — M25661 Stiffness of right knee, not elsewhere classified: Secondary | ICD-10-CM | POA: Diagnosis not present

## 2015-11-28 DIAGNOSIS — M25561 Pain in right knee: Secondary | ICD-10-CM | POA: Diagnosis not present

## 2015-11-28 DIAGNOSIS — Z96651 Presence of right artificial knee joint: Secondary | ICD-10-CM | POA: Diagnosis not present

## 2015-11-28 DIAGNOSIS — R262 Difficulty in walking, not elsewhere classified: Secondary | ICD-10-CM | POA: Diagnosis not present

## 2015-12-01 DIAGNOSIS — M25561 Pain in right knee: Secondary | ICD-10-CM | POA: Diagnosis not present

## 2015-12-01 DIAGNOSIS — Z96651 Presence of right artificial knee joint: Secondary | ICD-10-CM | POA: Diagnosis not present

## 2015-12-01 DIAGNOSIS — R262 Difficulty in walking, not elsewhere classified: Secondary | ICD-10-CM | POA: Diagnosis not present

## 2015-12-01 DIAGNOSIS — M25661 Stiffness of right knee, not elsewhere classified: Secondary | ICD-10-CM | POA: Diagnosis not present

## 2015-12-05 DIAGNOSIS — M25661 Stiffness of right knee, not elsewhere classified: Secondary | ICD-10-CM | POA: Diagnosis not present

## 2015-12-05 DIAGNOSIS — Z96651 Presence of right artificial knee joint: Secondary | ICD-10-CM | POA: Diagnosis not present

## 2015-12-05 DIAGNOSIS — R262 Difficulty in walking, not elsewhere classified: Secondary | ICD-10-CM | POA: Diagnosis not present

## 2015-12-05 DIAGNOSIS — M25561 Pain in right knee: Secondary | ICD-10-CM | POA: Diagnosis not present

## 2015-12-07 DIAGNOSIS — R262 Difficulty in walking, not elsewhere classified: Secondary | ICD-10-CM | POA: Diagnosis not present

## 2015-12-07 DIAGNOSIS — Z96651 Presence of right artificial knee joint: Secondary | ICD-10-CM | POA: Diagnosis not present

## 2015-12-07 DIAGNOSIS — M25661 Stiffness of right knee, not elsewhere classified: Secondary | ICD-10-CM | POA: Diagnosis not present

## 2015-12-07 DIAGNOSIS — M25561 Pain in right knee: Secondary | ICD-10-CM | POA: Diagnosis not present

## 2015-12-12 DIAGNOSIS — R262 Difficulty in walking, not elsewhere classified: Secondary | ICD-10-CM | POA: Diagnosis not present

## 2015-12-12 DIAGNOSIS — M25661 Stiffness of right knee, not elsewhere classified: Secondary | ICD-10-CM | POA: Diagnosis not present

## 2015-12-12 DIAGNOSIS — M25561 Pain in right knee: Secondary | ICD-10-CM | POA: Diagnosis not present

## 2015-12-12 DIAGNOSIS — Z96651 Presence of right artificial knee joint: Secondary | ICD-10-CM | POA: Diagnosis not present

## 2015-12-14 DIAGNOSIS — M25661 Stiffness of right knee, not elsewhere classified: Secondary | ICD-10-CM | POA: Diagnosis not present

## 2015-12-14 DIAGNOSIS — Z96651 Presence of right artificial knee joint: Secondary | ICD-10-CM | POA: Diagnosis not present

## 2015-12-14 DIAGNOSIS — R262 Difficulty in walking, not elsewhere classified: Secondary | ICD-10-CM | POA: Diagnosis not present

## 2015-12-14 DIAGNOSIS — M25561 Pain in right knee: Secondary | ICD-10-CM | POA: Diagnosis not present

## 2015-12-18 DIAGNOSIS — M25561 Pain in right knee: Secondary | ICD-10-CM | POA: Diagnosis not present

## 2015-12-18 DIAGNOSIS — Z96651 Presence of right artificial knee joint: Secondary | ICD-10-CM | POA: Diagnosis not present

## 2015-12-18 DIAGNOSIS — M25661 Stiffness of right knee, not elsewhere classified: Secondary | ICD-10-CM | POA: Diagnosis not present

## 2015-12-18 DIAGNOSIS — R262 Difficulty in walking, not elsewhere classified: Secondary | ICD-10-CM | POA: Diagnosis not present

## 2015-12-21 ENCOUNTER — Encounter: Payer: Self-pay | Admitting: Adult Health

## 2015-12-21 ENCOUNTER — Ambulatory Visit (INDEPENDENT_AMBULATORY_CARE_PROVIDER_SITE_OTHER): Payer: Medicare Other | Admitting: Adult Health

## 2015-12-21 VITALS — BP 118/62 | Ht 68.0 in | Wt 152.3 lb

## 2015-12-21 DIAGNOSIS — Z96651 Presence of right artificial knee joint: Secondary | ICD-10-CM | POA: Diagnosis not present

## 2015-12-21 DIAGNOSIS — M25561 Pain in right knee: Secondary | ICD-10-CM | POA: Diagnosis not present

## 2015-12-21 DIAGNOSIS — H6121 Impacted cerumen, right ear: Secondary | ICD-10-CM

## 2015-12-21 DIAGNOSIS — M25661 Stiffness of right knee, not elsewhere classified: Secondary | ICD-10-CM | POA: Diagnosis not present

## 2015-12-21 DIAGNOSIS — Z23 Encounter for immunization: Secondary | ICD-10-CM | POA: Diagnosis not present

## 2015-12-21 DIAGNOSIS — R262 Difficulty in walking, not elsewhere classified: Secondary | ICD-10-CM | POA: Diagnosis not present

## 2015-12-21 NOTE — Progress Notes (Signed)
Subjective:    [name], female    DOB: 10/11/1945, 70 y.o.   MRN: [id number]  HPI  70 year old female who presents to the office today for the feeling of her right ear being clogged. She denies any pain or ringing but does endorse loss of hearing.   Has not had any fevers or feeling acutely ill.   Review of Systems  Constitutional: Negative.   HENT: Negative for ear discharge, ear pain and tinnitus.   Respiratory: Negative.   Cardiovascular: Negative.   All other systems reviewed and are negative.  Past Medical History:  Diagnosis Date  . Arthritis    right knee and foot- OSteo   . Asthma    treated in 20's and 30's  . 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 Complication of anesthesia    very slow to awaken   . GERD (gastroesophageal reflux disease)    prn Prilosec  . Head injury, closed    10/26/15- years ago - 30ish  . Shortness of breath dyspnea    with exertion  . Vaso vagal episode    passed out 2 times, close to passing out 3 times  . Vertigo    was treated with rehab March and April 2017, does exercises if she begins to have symptoms    Social History   Social History  . Marital status: Married    Spouse name: N/A  . Number of children: N/A  . Years of education: N/A   Occupational History  . Not on file.   Social History Main Topics  . Smoking status: Former Smoker    Packs/day: 1.00    Years: 20.00    Types: Cigarettes    Quit date: 03/24/1990  . Smokeless tobacco: Former Systems developer  . Alcohol use 3.6 oz/week    6 Glasses of wine per week  . Drug use: No  . Sexual activity: Not on file   Other Topics Concern  . Not on file   Social History Narrative  . No narrative on file    Past Surgical History:  Procedure Laterality Date  . ABDOMINAL HYSTERECTOMY  1993   menorrhagia.  Marland Kitchen BREAST ENHANCEMENT SURGERY  1978  . COLONOSCOPY W/ POLYPECTOMY    . KNEE ARTHROSCOPY W/ MENISCAL REPAIR Right   . TOTAL KNEE ARTHROPLASTY Right  11/03/2015   Procedure: RIGHT TOTAL KNEE ARTHROPLASTY;  Surgeon: Dorna Leitz, MD;  Location: [facility];  Service: Orthopedics;  Laterality: Right;  . TUBAL LIGATION  1972  . VARICOSE VEIN SURGERY  1978    Family History  Problem Relation Age of Onset  . Heart disease Mother 30  . Cancer Father 7    pancreatic    Allergies  Allergen Reactions  . Adhesive [Tape] Hives    Use paper tape only    Current Outpatient Prescriptions on File Prior to Visit  Medication Sig Dispense Refill  . aspirin EC 325 MG tablet Take 1 tablet (325 mg total) by mouth 2 (two) times daily after a meal. Take x 1 month post op to decrease risk of blood clots. 60 tablet 0  . Calcium Carb-Cholecalciferol (CALCIUM 600 + D PO) Take 2 tablets by mouth daily.    . cycloSPORINE (RESTASIS) 0.05 % ophthalmic emulsion Place 1 drop into both eyes 2 (two) times daily.     . Flaxseed, Linseed, (FLAX SEED OIL) 1000 MG CAPS Take 1,000 mg by mouth daily.    Marland Kitchen  Ginger, Zingiber officinalis, (GINGER ROOT) 550 MG CAPS Take 550 mg by mouth daily.    . Multiple Vitamins-Minerals (ONE DAILY WOMENS 50+ PO) Take 1 tablet by mouth daily.    Marland Kitchen omeprazole (PRILOSEC) 20 MG capsule Take 20 mg by mouth daily as needed (for acid reflux).     . TURMERIC PO Take 400 mg by mouth daily.     No current facility-administered medications on file prior to visit.     BP 118/62   Ht 5\' 8"  (1.727 m)   Wt 152 lb 4.8 oz (69.1 kg)   BMI 23.16 kg/m       Objective:   Physical Exam  Constitutional: She is oriented to person, place, and time. She appears well-developed and well-nourished. No distress.  HENT:  Head: Normocephalic and atraumatic.  Right Ear: External ear normal.  Left Ear: External ear normal.  Nose: Nose normal.  Mouth/Throat: Oropharynx is clear and moist. No oropharyngeal exudate.  TM not visualized in right ear due to cerumen impaction.  TM visualized in left ear - no signs of infection   Neurological: She is alert and oriented  to person, place, and time.  Skin: Skin is warm and dry. No rash noted. She is not diaphoretic. No erythema. No pallor.  Psychiatric: She has a normal mood and affect. Her behavior is normal. Judgment and thought content normal.  Nursing note and vitals reviewed.     Assessment & Plan:   1. Hearing loss of right ear due to cerumen impaction - Cerumen impaction was easily dislodged with irrigation. No signs of infection noted. Patient tolerated procedure well - Follow up as needed  2. Need for influenza vaccination  - Flu vaccine HIGH DOSE PF (Fluzone High Dose)   Dorothyann Peng, NP

## 2015-12-25 DIAGNOSIS — Z96651 Presence of right artificial knee joint: Secondary | ICD-10-CM | POA: Diagnosis not present

## 2015-12-25 DIAGNOSIS — M25561 Pain in right knee: Secondary | ICD-10-CM | POA: Diagnosis not present

## 2015-12-25 DIAGNOSIS — R262 Difficulty in walking, not elsewhere classified: Secondary | ICD-10-CM | POA: Diagnosis not present

## 2015-12-25 DIAGNOSIS — M25661 Stiffness of right knee, not elsewhere classified: Secondary | ICD-10-CM | POA: Diagnosis not present

## 2015-12-28 DIAGNOSIS — M25561 Pain in right knee: Secondary | ICD-10-CM | POA: Diagnosis not present

## 2015-12-28 DIAGNOSIS — R262 Difficulty in walking, not elsewhere classified: Secondary | ICD-10-CM | POA: Diagnosis not present

## 2015-12-28 DIAGNOSIS — M25661 Stiffness of right knee, not elsewhere classified: Secondary | ICD-10-CM | POA: Diagnosis not present

## 2015-12-28 DIAGNOSIS — Z96651 Presence of right artificial knee joint: Secondary | ICD-10-CM | POA: Diagnosis not present

## 2016-01-01 DIAGNOSIS — M25561 Pain in right knee: Secondary | ICD-10-CM | POA: Diagnosis not present

## 2016-01-01 DIAGNOSIS — R262 Difficulty in walking, not elsewhere classified: Secondary | ICD-10-CM | POA: Diagnosis not present

## 2016-01-01 DIAGNOSIS — Z96651 Presence of right artificial knee joint: Secondary | ICD-10-CM | POA: Diagnosis not present

## 2016-01-01 DIAGNOSIS — M25661 Stiffness of right knee, not elsewhere classified: Secondary | ICD-10-CM | POA: Diagnosis not present

## 2016-01-03 DIAGNOSIS — R262 Difficulty in walking, not elsewhere classified: Secondary | ICD-10-CM | POA: Diagnosis not present

## 2016-01-03 DIAGNOSIS — Z96651 Presence of right artificial knee joint: Secondary | ICD-10-CM | POA: Diagnosis not present

## 2016-01-03 DIAGNOSIS — M25661 Stiffness of right knee, not elsewhere classified: Secondary | ICD-10-CM | POA: Diagnosis not present

## 2016-01-08 DIAGNOSIS — L570 Actinic keratosis: Secondary | ICD-10-CM | POA: Diagnosis not present

## 2016-01-08 DIAGNOSIS — D1801 Hemangioma of skin and subcutaneous tissue: Secondary | ICD-10-CM | POA: Diagnosis not present

## 2016-01-08 DIAGNOSIS — L820 Inflamed seborrheic keratosis: Secondary | ICD-10-CM | POA: Diagnosis not present

## 2016-01-08 DIAGNOSIS — D225 Melanocytic nevi of trunk: Secondary | ICD-10-CM | POA: Diagnosis not present

## 2016-01-08 DIAGNOSIS — Z85828 Personal history of other malignant neoplasm of skin: Secondary | ICD-10-CM | POA: Diagnosis not present

## 2016-01-08 DIAGNOSIS — L821 Other seborrheic keratosis: Secondary | ICD-10-CM | POA: Diagnosis not present

## 2016-01-10 DIAGNOSIS — R262 Difficulty in walking, not elsewhere classified: Secondary | ICD-10-CM | POA: Diagnosis not present

## 2016-01-10 DIAGNOSIS — M25561 Pain in right knee: Secondary | ICD-10-CM | POA: Diagnosis not present

## 2016-01-10 DIAGNOSIS — Z96651 Presence of right artificial knee joint: Secondary | ICD-10-CM | POA: Diagnosis not present

## 2016-01-10 DIAGNOSIS — M25661 Stiffness of right knee, not elsewhere classified: Secondary | ICD-10-CM | POA: Diagnosis not present

## 2016-01-17 DIAGNOSIS — R262 Difficulty in walking, not elsewhere classified: Secondary | ICD-10-CM | POA: Diagnosis not present

## 2016-01-17 DIAGNOSIS — M25661 Stiffness of right knee, not elsewhere classified: Secondary | ICD-10-CM | POA: Diagnosis not present

## 2016-01-17 DIAGNOSIS — Z96651 Presence of right artificial knee joint: Secondary | ICD-10-CM | POA: Diagnosis not present

## 2016-01-17 DIAGNOSIS — M25561 Pain in right knee: Secondary | ICD-10-CM | POA: Diagnosis not present

## 2016-01-22 DIAGNOSIS — M25561 Pain in right knee: Secondary | ICD-10-CM | POA: Diagnosis not present

## 2016-01-22 DIAGNOSIS — M25661 Stiffness of right knee, not elsewhere classified: Secondary | ICD-10-CM | POA: Diagnosis not present

## 2016-01-22 DIAGNOSIS — R262 Difficulty in walking, not elsewhere classified: Secondary | ICD-10-CM | POA: Diagnosis not present

## 2016-01-22 DIAGNOSIS — Z96651 Presence of right artificial knee joint: Secondary | ICD-10-CM | POA: Diagnosis not present

## 2016-01-24 DIAGNOSIS — Z1231 Encounter for screening mammogram for malignant neoplasm of breast: Secondary | ICD-10-CM | POA: Diagnosis not present

## 2016-01-24 LAB — HM MAMMOGRAPHY: HM MAMMO: NORMAL (ref 0–4)

## 2016-01-29 DIAGNOSIS — M25561 Pain in right knee: Secondary | ICD-10-CM | POA: Diagnosis not present

## 2016-01-29 DIAGNOSIS — M25661 Stiffness of right knee, not elsewhere classified: Secondary | ICD-10-CM | POA: Diagnosis not present

## 2016-01-29 DIAGNOSIS — R262 Difficulty in walking, not elsewhere classified: Secondary | ICD-10-CM | POA: Diagnosis not present

## 2016-01-29 DIAGNOSIS — Z96651 Presence of right artificial knee joint: Secondary | ICD-10-CM | POA: Diagnosis not present

## 2016-01-31 ENCOUNTER — Encounter: Payer: Self-pay | Admitting: Family Medicine

## 2016-02-01 DIAGNOSIS — R262 Difficulty in walking, not elsewhere classified: Secondary | ICD-10-CM | POA: Diagnosis not present

## 2016-02-01 DIAGNOSIS — M25661 Stiffness of right knee, not elsewhere classified: Secondary | ICD-10-CM | POA: Diagnosis not present

## 2016-02-01 DIAGNOSIS — Z96651 Presence of right artificial knee joint: Secondary | ICD-10-CM | POA: Diagnosis not present

## 2016-02-01 DIAGNOSIS — M25561 Pain in right knee: Secondary | ICD-10-CM | POA: Diagnosis not present

## 2016-02-08 DIAGNOSIS — M25561 Pain in right knee: Secondary | ICD-10-CM | POA: Diagnosis not present

## 2016-02-29 DIAGNOSIS — I87323 Chronic venous hypertension (idiopathic) with inflammation of bilateral lower extremity: Secondary | ICD-10-CM | POA: Diagnosis not present

## 2016-03-29 ENCOUNTER — Telehealth: Payer: Self-pay

## 2016-03-29 NOTE — Telephone Encounter (Signed)
Call to Ms. Rosekrans for AWV  Agreed to come in 2/7 on Wed at 1pm

## 2016-04-02 NOTE — Progress Notes (Addendum)
Subjective:   Mia Peterson is a 71 y.o. female who presents for Medicare Annual (Subsequent) preventive examination.  Medicare Annual Preventive Care Visit - Subsequent Last OV (is due for labs Mid march)   [place] as poor, fair, good or great? 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    BMI 23   Diet  Eat 3 meals a day Eat a lot of vegetables; lots of greens, kales Does not drink milk Eat yogurt; only eat whole wheat  Diet is excellent;  Very rarely eat sweets/ out one time a week    Exercise:  Exercise x 6 days a week Does a lot of recumbent back Deep water aerobics Strength and flexibility class Weight x 2 per week 60 x 6 days a week exercising and stretching    Dental; routine dental exams  ETOH; son is a Pharmacist, community;  Has younger son died x 2 yo  Spending time with dtr in law and grand children  6 grand children; 2 -3  And 17;  The others range from 12 to 17 doing well    HRA completed during the assessment today   2.) Review of Medical History: -PMH, PSH, Family History and current specialty and care providers reviewed and updated and listed below  - see scanned in document in chart and below  Social History   Social History  . Marital status: Married    Spouse name: N/A  . Number of children: N/A  . Years of education: N/A   Occupational History  . Not on file.   Social History Main Topics  . Smoking status: Former Smoker    Packs/day: 1.00    Years: 20.00    Types: Cigarettes    Quit date: 03/24/1990  . Smokeless tobacco: Former Systems developer  . Alcohol use 3.6 oz/week    6 Glasses of wine per week     Comment: ocasional use   . Drug use: No  . Sexual activity: Not on file   Other Topics Concern  . Not on file   Social History Narrative  . No narrative on file    Family History  Problem Relation  Age of Onset  . Heart disease Mother 6  . Cancer Father 51    pancreatic    Medical issues that coincide with lifestyle:    3.) Review of functional ability and level of safety: See Medicare screens for hearing; vision; falls; IADLs and ADLs, Advanced Directives  Safety information given for community, home and personal safety;   Stressors: ok now Sleep at hs; sleeps well 9:30 and 6am    General: alert, appear well hydrated and in no acute distress   Mood stable; attentive;   See patient instructions for recommendations.  4)The following written screening schedule of preventive measures were reviewed with assessment and plan made per below, orders and patient instructions:     Alcohol screening done / drinks wine occasionally   BMI WNL   STI screening (Hep C if born 19-65) offered and states she has given blood in the last 15 years   Tobacco Screening done / quit > 47 yo but had 20 pack year hx;   Vaccines:  (PPSV23 -one dose after 64, one before if risk factors),  Prevnar 13 - one does post 65 Influenza yearly  Tetanus q 10 years  or Tdap if not taken / TDAP; will hold for now;   Screening mammograph (yearly if >40) 12/2015 Due 12/2016; repeats every year    Screening Pap smear/pelvic exam (q2 years)  PAP; no gyn Declined further pap test    Colorectal cancer screening: colonoscopy q10y or colo-guard reviewed / Dr. Collene Mares;  2015; have another in 5 years 2020  Will update epic   Bone mass measurements(covered q2y bone density  (-1.8) utd or discussed and ordered per pt wishes Osteoporosis foundation for education Had Dexa at [place] for glaucoma(q1y if high risk - diabetes, FH, AA and > 50 or hispanic and > 65) neg  Hearing Screening   125Hz  250Hz  500Hz  1000Hz  2000Hz  3000Hz  4000Hz  6000Hz  8000Hz   Right ear:     100      Left ear:     100      Vision Screening Comments: Vision checks; checked every year Dr. Ellie Lunch Long term glasses Have  contacts in now   Cardiovascular screening blood tests (lipids q5y) Chol 209; HDL 76; Trig 64;  BS elevated while inpatient for TKR but other wise will within normal limits   Aging in place Moved last year to a one story townhouse   5) Summary: -risk factors and conditions per above assessment were discussed and treatment, recommendations and referrals were offered per documentation above and orders and patient instructions.  Education and counseling regarding the above review of health provided with a plan for the following: -fall prevention strategies discussed  -personal and community safety -healthy lifestyle discussed (weight loss, exercise, etc_  -importance and resources for completing advanced directives discussed -see patient instructions below for any other recommendations provided   Cardiac Risk Factors include: advanced age (>2men, >54 women);family history of premature cardiovascular disease     Objective:     Vitals: BP 112/80   Pulse 87   Ht 5\' 8"  (1.727 m)   Wt 153 lb 9 oz (69.7 kg)   SpO2 96%   BMI 23.35 kg/m   Body mass index is 23.35 kg/m.   Tobacco History  Smoking Status  . Former Smoker  . Packs/day: 1.00  . Years: 20.00  . Types: Cigarettes  . Quit date: 03/24/1990  Smokeless Tobacco  . Former Systems developer     Counseling given: Yes   Past Medical History:  Diagnosis Date  . Arthritis    right knee and foot- OSteo   . Asthma    treated in 20's and 30's  . 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 Complication of anesthesia    very slow to awaken   . GERD (gastroesophageal reflux disease)    prn Prilosec  . Head injury, closed    10/26/15- years ago - 30ish  . Shortness of breath dyspnea    with exertion  . Vaso vagal episode    passed out 2 times, close to passing out 3 times  . Vertigo    was treated with rehab March and April 2017, does exercises if she begins to have symptoms   Past Surgical History:  Procedure Laterality Date  .  ABDOMINAL HYSTERECTOMY  1993   menorrhagia.  Marland Kitchen BREAST ENHANCEMENT SURGERY  1978  . COLONOSCOPY W/ POLYPECTOMY    . KNEE ARTHROSCOPY W/ MENISCAL REPAIR Right   . TOTAL KNEE ARTHROPLASTY Right 11/03/2015   Procedure: RIGHT TOTAL KNEE ARTHROPLASTY;  Surgeon: Dorna Leitz, MD;  Location: [facility];  Service: Orthopedics;  Laterality: Right;  .  TUBAL LIGATION  1972  . VARICOSE VEIN SURGERY  1978   Family History  Problem Relation Age of Onset  . Heart disease Mother 11  . Cancer Father 18    pancreatic   History  Sexual Activity  . Sexual activity: Not on file    Outpatient Encounter Prescriptions as of 04/03/2016  Medication Sig  . cycloSPORINE (RESTASIS) 0.05 % ophthalmic emulsion Place 1 drop into both eyes 2 (two) times daily.   . Flaxseed, Linseed, (FLAX SEED OIL) 1000 MG CAPS Take 1,000 mg by mouth daily.  . Ginger, Zingiber officinalis, (GINGER ROOT) 550 MG CAPS Take 550 mg by mouth daily.  . Multiple Vitamins-Minerals (ONE DAILY WOMENS 50+ PO) Take 1 tablet by mouth daily.  Marland Kitchen omeprazole (PRILOSEC) 20 MG capsule Take 20 mg by mouth daily as needed (for acid reflux).   . TURMERIC PO Take 400 mg by mouth daily.  Marland Kitchen aspirin EC 325 MG tablet Take 1 tablet (325 mg total) by mouth 2 (two) times daily after a meal. Take x 1 month post op to decrease risk of blood clots. (Patient not taking: Reported on 04/03/2016)  . Calcium Carb-Cholecalciferol (CALCIUM 600 + D PO) Take 2 tablets by mouth daily.   No facility-administered encounter medications on file as of 04/03/2016.     Activities of Daily Living In your present state of health, do you have any difficulty performing the following activities: 04/03/2016 11/03/2015  Hearing? N -  Vision? N -  Difficulty concentrating or making decisions? N -  Walking or climbing stairs? Y -  Dressing or bathing? N -  Doing errands, shopping? N N  Preparing Food and eating ? N -  Using the Toilet? N -  In the past six months, have you accidently leaked urine?  (No Data) -  Do you have problems with loss of bowel control? N -  Managing your Medications? N -  Managing your Finances? N -  Housekeeping or managing your Housekeeping? N -  Some recent data might be hidden    Patient Care Team: Eulas Post, MD as PCP - General    Assessment:     Exercise Activities and Dietary recommendations Current Exercise Habits: Structured exercise class, Type of exercise: strength training/weights;walking;yoga, Time (Minutes): > 60, Frequency (Times/Week): 6, Weekly Exercise (Minutes/Week): 0, Intensity: Moderate exercises over 60 minutes 6 days a week   Goals    . patient          Continue to maintain your health through diet and exercise  Work with church       Fall Risk Fall Risk  04/03/2016 03/30/2015 09/29/2014 09/21/2014 09/21/2014  Falls in the past year? No No No No No   Depression Screen PHQ 2/9 Scores 04/03/2016 03/30/2015 09/29/2014 01/19/2013  PHQ - 2 Score 0 0 0 0    Functional changes In some ways her knee is better; but it is still hard to go up and down stairs   Cognitive Function Ad8 score reviewed for issues:  Issues making decisions:  Less interest in hobbies / activities:  Repeats questions, stories (family complaining):  Trouble using ordinary gadgets (microwave, computer, phone):  Forgets the month or year:   Mismanaging finances:   Remembering appts:  Daily problems with thinking and/or memory: Ad8 score is= 0  Some aphasia; thinking of the right word 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 Ellie Lunch Long term glasses Have contacts in now  was told she may be developing MD  Beginning of cataracts      Immunization History  Administered Date(s)  Administered  . Hepatitis A, Adult 08/15/2014, 01/16/2015  . Influenza Split 11/23/2010  . Influenza, High Dose Seasonal PF 01/19/2013, 01/03/2014, 01/16/2015, 12/21/2015  . Pneumococcal Conjugate-13 01/19/2013  . Pneumococcal Polysaccharide-23 02/26/2007, 03/30/2015  . Td 02/26/2004  . Zoster 11/23/2010   Screening Tests Health Maintenance  Topic Date Due  . Hepatitis C Screening  Aug 20, 1945  . TETANUS/TDAP  02/25/2014  . MAMMOGRAM  01/23/2018  . COLONOSCOPY  02/08/2019  . INFLUENZA VACCINE  Completed  . DEXA SCAN  Completed  . ZOSTAVAX  Completed  . PNA vac Low Risk Adult  Completed      Plan:    PCP Notes  Health Maintenance Will postpone Tdap  today Hepatitis C; gave blood in the last 15 years   Abnormal Screens  Is taking calcium 1200mg ; noted to monitor how much calcium she is taking in foods as well for a total of 1200mg  q day  Referrals did not request a urology referral at this time   Patient concerns;  Nurse Concerns; urgency; can't hold  Kagel exercises x 30 tid  Does get up x 2 during the night  Tried myrbetriq  but did not help   Next PCP apt Will make apt with Dr. Elease Hashimoto  Discussed annual labs due mid March   During the course of the visit the patient was educated and counseled about the following appropriate screening and preventive services:   Vaccines to include Pneumoccal, Influenza, Hepatitis B, Td, Zostavax, HCV  Electrocardiogram  Cardiovascular Disease  Colorectal cancer screening Dr. Collene Mares due in 2020   Bone density screening osteopenia   Educated on calcium; vit d and exercise   Diabetes screening  Glaucoma screening vision checks annually  Mammography/annually   Nutrition counseling   Patient Instructions (the written plan) was given to the patient.   Mia Peterson, Charley, RN  04/03/2016  Agree with above assessment and plan as per Mia Fines RN  Eulas Post MD [facility]

## 2016-04-03 ENCOUNTER — Ambulatory Visit (INDEPENDENT_AMBULATORY_CARE_PROVIDER_SITE_OTHER): Payer: Medicare Other

## 2016-04-03 ENCOUNTER — Telehealth: Payer: Self-pay

## 2016-04-03 VITALS — BP 112/80 | HR 87 | Ht 68.0 in | Wt 153.6 lb

## 2016-04-03 DIAGNOSIS — Z0000 Encounter for general adult medical examination without abnormal findings: Secondary | ICD-10-CM

## 2016-04-03 NOTE — Telephone Encounter (Signed)
Ms. Centeno in for AWV. Stated she tried the Myrbetriq but did not feel this helps Continues to wear a pad for bladder leakage prior to voiding;  Did not see the need to fup at this time unless you have alternatives that may be helpful for her to try.  Please advise.   Due for labs mid March; but did not make fup apt today

## 2016-04-03 NOTE — Telephone Encounter (Signed)
I feel next step would be Urology referral. I will discuss with her at follow up.

## 2016-04-03 NOTE — Patient Instructions (Addendum)
Mia Peterson , Thank you for taking time to come for your Medicare Wellness Visit. I appreciate your ongoing commitment to your health goals. Please review the following plan we discussed and let me know if I can assist you in the future.   Please bring a copy of the HCPOA and Living will for the chart  Will take tdap at a later time A Tetanus is recommended every 10 years. Medicare covers a tetanus if you have a cut or wound; otherwise, there may be a charge. If you had not had a tetanus with pertusses, known as the Tdap, you can take this anytime.   Medicare now request all "baby boomers" test for possible exposure to Hepatitis C. Many may have been exposed due to dental work, tatoo's, vaccinations when young. The Hepatitis C virus is dormant for many years and then sometimes will cause liver cancer. If you gave blood in the past 15 years, you were most likely checked for Hep C. If you rec'd blood; you may want to consider testing or if you are high risk for any other reason.    Osteoporosis foundation .org  Prevention of falls: Remove rugs or any tripping hazards in the home Use Non slip mats in bathtubs and showers Placing grab bars next to the toilet and or shower Placing handrails on both sides of the stair way Adding extra lighting in the home.   Personal safety issues reviewed:  1. Consider starting a community watch program per [facility]  2.  Changes batteries is smoke detector and/or carbon monoxide detector  3.  If you have firearms; keep them in a safe place 4.  Wear protection when in the sun; Always wear sunscreen or a hat; It is good to have your doctor check your skin annually or review any new areas of concern 5. Driving safety; Keep in the right lane; stay 3 car lengths behind the car in front of you on the highway; look 3 times prior to pulling out; carry your cell phone everywhere you go!    Learn about the Yellow Dot program:  The program allows  first responders at your emergency to have access to who your physician is, as well as your medications and medical conditions.  Citizens requesting the Yellow Dot Packages should contact Master Corporal Nunzio Cobbs at the [facility] [phone] for the first week of the program and beginning the week after Easter citizens should contact their Scientist, physiological.     These are the goals we discussed: Goals    . patient          Continue to maintain your health through diet and exercise  Work with church        This is a list of the screening recommended for you and due dates:  Health Maintenance  Topic Date Due  .  Hepatitis C: One time screening is recommended by Center for Disease Control  (CDC) for  adults born from [birth year] through 1965.   1945/07/22  . Tetanus Vaccine  02/25/2014  . Mammogram  01/23/2018  . Colon Cancer Screening  02/08/2024  . Flu Shot  Completed  . DEXA scan (bone density measurement)  Completed  . Shingles Vaccine  Completed  . Pneumonia vaccines  Completed        Fall Prevention in the Home Introduction Falls can cause injuries. They can happen to people of all ages. There are many things you can do to make  your home safe and to help prevent falls. What can I do on the outside of my home?  Regularly fix the edges of walkways and driveways and fix any cracks.  Remove anything that might make you trip as you walk through a door, such as a raised step or threshold.  Trim any bushes or trees on the path to your home.  Use bright outdoor lighting.  Clear any walking paths of anything that might make someone trip, such as rocks or tools.  Regularly check to see if handrails are loose or broken. Make sure that both sides of any steps have handrails.  Any raised decks and porches should have guardrails on the edges.  Have any leaves, snow, or ice cleared regularly.  Use sand or salt on walking paths during  winter.  Clean up any spills in your garage right away. This includes oil or grease spills. What can I do in the bathroom?  Use night lights.  Install grab bars by the toilet and in the tub and shower. Do not use towel bars as grab bars.  Use non-skid mats or decals in the tub or shower.  If you need to sit down in the shower, use a plastic, non-slip stool.  Keep the floor dry. Clean up any water that spills on the floor as soon as it happens.  Remove soap buildup in the tub or shower regularly.  Attach bath mats securely with double-sided non-slip rug tape.  Do not have throw rugs and other things on the floor that can make you trip. What can I do in the bedroom?  Use night lights.  Make sure that you have a light by your bed that is easy to reach.  Do not use any sheets or blankets that are too big for your bed. They should not hang down onto the floor.  Have a firm chair that has side arms. You can use this for support while you get dressed.  Do not have throw rugs and other things on the floor that can make you trip. What can I do in the kitchen?  Clean up any spills right away.  Avoid walking on wet floors.  Keep items that you use a lot in easy-to-reach places.  If you need to reach something above you, use a strong step stool that has a grab bar.  Keep electrical cords out of the way.  Do not use floor polish or wax that makes floors slippery. If you must use wax, use non-skid floor wax.  Do not have throw rugs and other things on the floor that can make you trip. What can I do with my stairs?  Do not leave any items on the stairs.  Make sure that there are handrails on both sides of the stairs and use them. Fix handrails that are broken or loose. Make sure that handrails are as long as the stairways.  Check any carpeting to make sure that it is firmly attached to the stairs. Fix any carpet that is loose or worn.  Avoid having throw rugs at the top or  bottom of the stairs. If you do have throw rugs, attach them to the floor with carpet tape.  Make sure that you have a light switch at the top of the stairs and the bottom of the stairs. If you do not have them, ask someone to add them for you. What else can I do to help prevent falls?  Wear shoes that:  Do  not have high heels.  Have rubber bottoms.  Are comfortable and fit you well.  Are closed at the toe. Do not wear sandals.  If you use a stepladder:  Make sure that it is fully opened. Do not climb a closed stepladder.  Make sure that both sides of the stepladder are locked into place.  Ask someone to hold it for you, if possible.  Clearly mark and make sure that you can see:  Any grab bars or handrails.  First and last steps.  Where the edge of each step is.  Use tools that help you move around (mobility aids) if they are needed. These include:  Canes.  Walkers.  Scooters.  Crutches.  Turn on the lights when you go into a dark area. Replace any light bulbs as soon as they burn out.  Set up your furniture so you have a clear path. Avoid moving your furniture around.  If any of your floors are uneven, fix them.  If there are any pets around you, be aware of where they are.  Review your medicines with your doctor. Some medicines can make you feel dizzy. This can increase your chance of falling. Ask your doctor what other things that you can do to help prevent falls. This information is not intended to replace advice given to you by your health care provider. Make sure you discuss any questions you have with your health care provider. Document Released: 12/08/2008 Document Revised: 07/20/2015 Document Reviewed: 03/18/2014  2017 Elsevier  Health Maintenance, Female Introduction Adopting a healthy lifestyle and getting preventive care can go a long way to promote health and wellness. Talk with your health care provider about what schedule of regular  examinations is right for you. This is a good chance for you to check in with your provider about disease prevention and staying healthy. In between checkups, there are plenty of things you can do on your own. Experts have done a lot of research about which lifestyle changes and preventive measures are most likely to keep you healthy. Ask your health care provider for more information. Weight and diet Eat a healthy diet  Be sure to include plenty of vegetables, fruits, low-fat dairy products, and lean protein.  Do not eat a lot of foods high in solid fats, added sugars, or salt.  Get regular exercise. This is one of the most important things you can do for your health.  Most adults should exercise for at least 150 minutes each week. The exercise should increase your heart rate and make you sweat (moderate-intensity exercise).  Most adults should also do strengthening exercises at least twice a week. This is in addition to the moderate-intensity exercise. Maintain a healthy weight  Body mass index (BMI) is a measurement that can be used to identify possible weight problems. It estimates body fat based on height and weight. Your health care provider can help determine your BMI and help you achieve or maintain a healthy weight.  For females 108 years of age and older:  A BMI below 18.5 is considered underweight.  A BMI of 18.5 to 24.9 is normal.  A BMI of 25 to 29.9 is considered overweight.  A BMI of 30 and above is considered obese. Watch levels of cholesterol and blood lipids  You should start having your blood tested for lipids and cholesterol at 71 years of age, then have this test every 5 years.  You may need to have your cholesterol levels checked more often  if:  Your lipid or cholesterol levels are high.  You are older than 71 years of age.  You are at high risk for heart disease. Cancer screening Lung Cancer  Lung cancer screening is recommended for adults 43-80 years  old who are at high risk for lung cancer because of a history of smoking.  A yearly low-dose CT scan of the lungs is recommended for people who:  Currently smoke.  Have quit within the past 15 years.  Have at least a 30-pack-year history of smoking. A pack year is smoking an average of one pack of cigarettes a day for 1 year.  Yearly screening should continue until it has been 15 years since you quit.  Yearly screening should stop if you develop a health problem that would prevent you from having lung cancer treatment. Breast Cancer  Practice breast self-awareness. This means understanding how your breasts normally appear and feel.  It also means doing regular breast self-exams. Let your health care provider know about any changes, no matter how small.  If you are in your 20s or 30s, you should have a clinical breast exam (CBE) by a health care provider every 1-3 years as part of a regular health exam.  If you are 73 or older, have a CBE every year. Also consider having a breast X-ray (mammogram) every year.  If you have a family history of breast cancer, talk to your health care provider about genetic screening.  If you are at high risk for breast cancer, talk to your health care provider about having an MRI and a mammogram every year.  Breast cancer gene (BRCA) assessment is recommended for women who have family members with BRCA-related cancers. BRCA-related cancers include:  Breast.  Ovarian.  Tubal.  Peritoneal cancers.  Results of the assessment will determine the need for genetic counseling and BRCA1 and BRCA2 testing. Cervical Cancer  Your health care provider may recommend that you be screened regularly for cancer of the pelvic organs (ovaries, uterus, and vagina). This screening involves a pelvic examination, including checking for microscopic changes to the surface of your cervix (Pap test). You may be encouraged to have this screening done every 3 years, beginning  at age 61.  For women ages 89-65, health care providers may recommend pelvic exams and Pap testing every 3 years, or they may recommend the Pap and pelvic exam, combined with testing for human papilloma virus (HPV), every 5 years. Some types of HPV increase your risk of cervical cancer. Testing for HPV may also be done on women of any age with unclear Pap test results.  Other health care providers may not recommend any screening for nonpregnant women who are considered low risk for pelvic cancer and who do not have symptoms. Ask your health care provider if a screening pelvic exam is right for you.  If you have had past treatment for cervical cancer or a condition that could lead to cancer, you need Pap tests and screening for cancer for at least 20 years after your treatment. If Pap tests have been discontinued, your risk factors (such as having a new sexual partner) need to be reassessed to determine if screening should resume. Some women have medical problems that increase the chance of getting cervical cancer. In these cases, your health care provider may recommend more frequent screening and Pap tests. Colorectal Cancer  This type of cancer can be detected and often prevented.  Routine colorectal cancer screening usually begins at 71 years of  age and continues through 71 years of age.  Your health care provider may recommend screening at an earlier age if you have risk factors for colon cancer.  Your health care provider may also recommend using home test kits to check for hidden blood in the stool.  A small camera at the end of a tube can be used to examine your colon directly (sigmoidoscopy or colonoscopy). This is done to check for the earliest forms of colorectal cancer.  Routine screening usually begins at age 39.  Direct examination of the colon should be repeated every 5-10 years through 71 years of age. However, you may need to be screened more often if early forms of precancerous  polyps or small growths are found. Skin Cancer  Check your skin from head to toe regularly.  Tell your health care provider about any new moles or changes in moles, especially if there is a change in a mole's shape or color.  Also tell your health care provider if you have a mole that is larger than the size of a pencil eraser.  Always use sunscreen. Apply sunscreen liberally and repeatedly throughout the day.  Protect yourself by wearing long sleeves, pants, a wide-brimmed hat, and sunglasses whenever you are outside. Heart disease, diabetes, and high blood pressure  High blood pressure causes heart disease and increases the risk of stroke. High blood pressure is more likely to develop in:  People who have blood pressure in the high end of the normal range (130-139/85-89 mm Hg).  People who are overweight or obese.  People who are African American.  If you are 49-57 years of age, have your blood pressure checked every 3-5 years. If you are 30 years of age or older, have your blood pressure checked every year. You should have your blood pressure measured twice-once when you are at a hospital or clinic, and once when you are not at a hospital or clinic. Record the average of the two measurements. To check your blood pressure when you are not at a hospital or clinic, you can use:  An automated blood pressure machine at a pharmacy.  A home blood pressure monitor.  If you are between 61 years and 17 years old, ask your health care provider if you should take aspirin to prevent strokes.  Have regular diabetes screenings. This involves taking a blood sample to check your fasting blood sugar level.  If you are at a normal weight and have a low risk for diabetes, have this test once every three years after 71 years of age.  If you are overweight and have a high risk for diabetes, consider being tested at a younger age or more often. Preventing infection Hepatitis B  If you have a higher  risk for hepatitis B, you should be screened for this virus. You are considered at high risk for hepatitis B if:  You were born in a country where hepatitis B is common. Ask your health care provider which countries are considered high risk.  Your parents were born in a high-risk country, and you have not been immunized against hepatitis B (hepatitis B vaccine).  You have HIV or AIDS.  You use needles to inject street drugs.  You live with someone who has hepatitis B.  You have had sex with someone who has hepatitis B.  You get hemodialysis treatment.  You take certain medicines for conditions, including cancer, organ transplantation, and autoimmune conditions. Hepatitis C  Blood testing is recommended for:  Everyone born from [birth year] through 1965.  Anyone with known risk factors for hepatitis C. Sexually transmitted infections (STIs)  You should be screened for sexually transmitted infections (STIs) including gonorrhea and chlamydia if:  You are sexually active and are younger than 71 years of age.  You are older than 71 years of age and your health care provider tells you that you are at risk for this type of infection.  Your sexual activity has changed since you were last screened and you are at an increased risk for chlamydia or gonorrhea. Ask your health care provider if you are at risk.  If you do not have HIV, but are at risk, it may be recommended that you take a prescription medicine daily to prevent HIV infection. This is called pre-exposure prophylaxis (PrEP). You are considered at risk if:  You are sexually active and do not regularly use condoms or know the HIV status of your partner(s).  You take drugs by injection.  You are sexually active with a partner who has HIV. Talk with your health care provider about whether you are at high risk of being infected with HIV. If you choose to begin PrEP, you should first be tested for HIV. You should then be tested every 3  months for as long as you are taking PrEP. Pregnancy  If you are premenopausal and you may become pregnant, ask your health care provider about preconception counseling.  If you may become pregnant, take 400 to 800 micrograms (mcg) of folic acid every day.  If you want to prevent pregnancy, talk to your health care provider about birth control (contraception). Osteoporosis and menopause  Osteoporosis is a disease in which the bones lose minerals and strength with aging. This can result in serious bone fractures. Your risk for osteoporosis can be identified using a bone density scan.  If you are 39 years of age or older, or if you are at risk for osteoporosis and fractures, ask your health care provider if you should be screened.  Ask your health care provider whether you should take a calcium or vitamin D supplement to lower your risk for osteoporosis.  Menopause may have certain physical symptoms and risks.  Hormone replacement therapy may reduce some of these symptoms and risks. Talk to your health care provider about whether hormone replacement therapy is right for you. Follow these instructions at home:  Schedule regular health, dental, and eye exams.  Stay current with your immunizations.  Do not use any tobacco products including cigarettes, chewing tobacco, or electronic cigarettes.  If you are pregnant, do not drink alcohol.  If you are breastfeeding, limit how much and how often you drink alcohol.  Limit alcohol intake to no more than 1 drink per day for nonpregnant women. One drink equals 12 ounces of beer, 5 ounces of wine, or 1 ounces of hard liquor.  Do not use street drugs.  Do not share needles.  Ask your health care provider for help if you need support or information about quitting drugs.  Tell your health care provider if you often feel depressed.  Tell your health care provider if you have ever been abused or do not feel safe at home. This information is  not intended to replace advice given to you by your health care provider. Make sure you discuss any questions you have with your health care provider. Document Released: 08/27/2010 Document Revised: 07/20/2015 Document Reviewed: 11/15/2014  2017 Elsevier

## 2016-04-04 NOTE — Telephone Encounter (Signed)
Fup with Mia Peterson; left VM on line identified by her name. Stated Dr Elease Hashimoto would discuss UA issues at the next office visit.  For questions, left my direct line

## 2016-04-12 DIAGNOSIS — H5213 Myopia, bilateral: Secondary | ICD-10-CM | POA: Diagnosis not present

## 2016-04-12 DIAGNOSIS — H2513 Age-related nuclear cataract, bilateral: Secondary | ICD-10-CM | POA: Diagnosis not present

## 2016-05-07 DIAGNOSIS — Z85828 Personal history of other malignant neoplasm of skin: Secondary | ICD-10-CM | POA: Diagnosis not present

## 2016-05-07 DIAGNOSIS — L309 Dermatitis, unspecified: Secondary | ICD-10-CM | POA: Diagnosis not present

## 2016-06-06 DIAGNOSIS — H01004 Unspecified blepharitis left upper eyelid: Secondary | ICD-10-CM | POA: Diagnosis not present

## 2016-06-06 DIAGNOSIS — H01001 Unspecified blepharitis right upper eyelid: Secondary | ICD-10-CM | POA: Diagnosis not present

## 2016-06-06 DIAGNOSIS — H04123 Dry eye syndrome of bilateral lacrimal glands: Secondary | ICD-10-CM | POA: Diagnosis not present

## 2016-06-07 DIAGNOSIS — L718 Other rosacea: Secondary | ICD-10-CM | POA: Diagnosis not present

## 2016-06-07 DIAGNOSIS — L245 Irritant contact dermatitis due to other chemical products: Secondary | ICD-10-CM | POA: Diagnosis not present

## 2016-06-07 DIAGNOSIS — Z85828 Personal history of other malignant neoplasm of skin: Secondary | ICD-10-CM | POA: Diagnosis not present

## 2016-07-08 DIAGNOSIS — L570 Actinic keratosis: Secondary | ICD-10-CM | POA: Diagnosis not present

## 2016-07-08 DIAGNOSIS — L718 Other rosacea: Secondary | ICD-10-CM | POA: Diagnosis not present

## 2016-07-08 DIAGNOSIS — L814 Other melanin hyperpigmentation: Secondary | ICD-10-CM | POA: Diagnosis not present

## 2016-07-08 DIAGNOSIS — L820 Inflamed seborrheic keratosis: Secondary | ICD-10-CM | POA: Diagnosis not present

## 2016-07-08 DIAGNOSIS — L309 Dermatitis, unspecified: Secondary | ICD-10-CM | POA: Diagnosis not present

## 2016-07-08 DIAGNOSIS — D225 Melanocytic nevi of trunk: Secondary | ICD-10-CM | POA: Diagnosis not present

## 2016-07-08 DIAGNOSIS — Z85828 Personal history of other malignant neoplasm of skin: Secondary | ICD-10-CM | POA: Diagnosis not present

## 2016-07-08 DIAGNOSIS — L821 Other seborrheic keratosis: Secondary | ICD-10-CM | POA: Diagnosis not present

## 2016-08-20 DIAGNOSIS — M25561 Pain in right knee: Secondary | ICD-10-CM | POA: Diagnosis not present

## 2016-08-26 ENCOUNTER — Telehealth: Payer: Self-pay | Admitting: Family Medicine

## 2016-08-26 DIAGNOSIS — H9190 Unspecified hearing loss, unspecified ear: Secondary | ICD-10-CM

## 2016-08-26 NOTE — Telephone Encounter (Signed)
Referral placed.

## 2016-08-26 NOTE — Telephone Encounter (Signed)
Most insurance does not need referral but OK to refer if she needs one.

## 2016-08-26 NOTE — Telephone Encounter (Signed)
° ° °  Pt call to ask for a referral to see a audiologist.

## 2016-09-10 IMAGING — CR DG CHEST 2V
2 series · 2 of 2 positions shown · non-contrast
Comparison: None in PACs

CLINICAL DATA: Preoperative examination prior to knee joint
replacement ; remote history of smoking, asthma in young adults
foot.

EXAM:
CHEST  2 VIEW

[w chest pa]
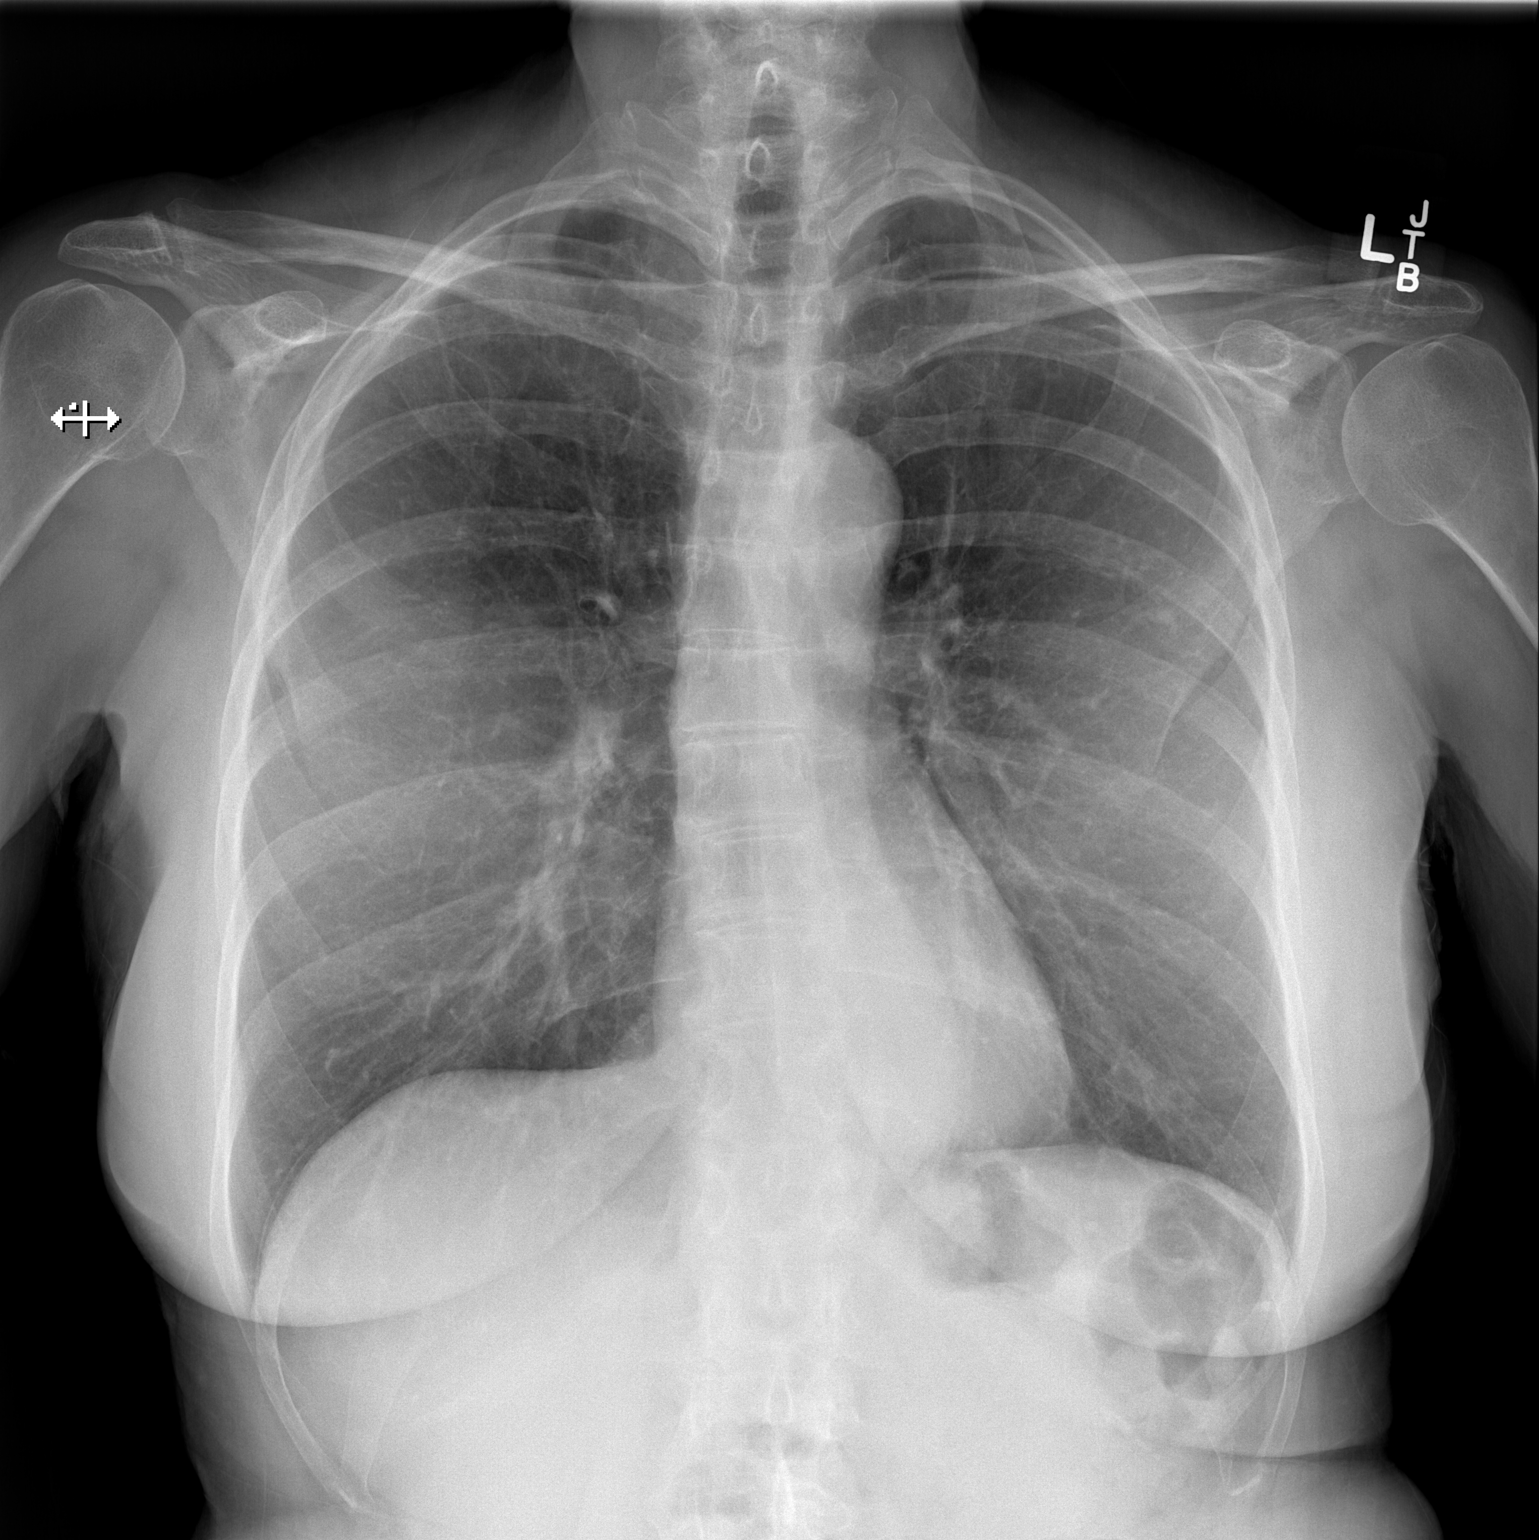

[w chest lat]
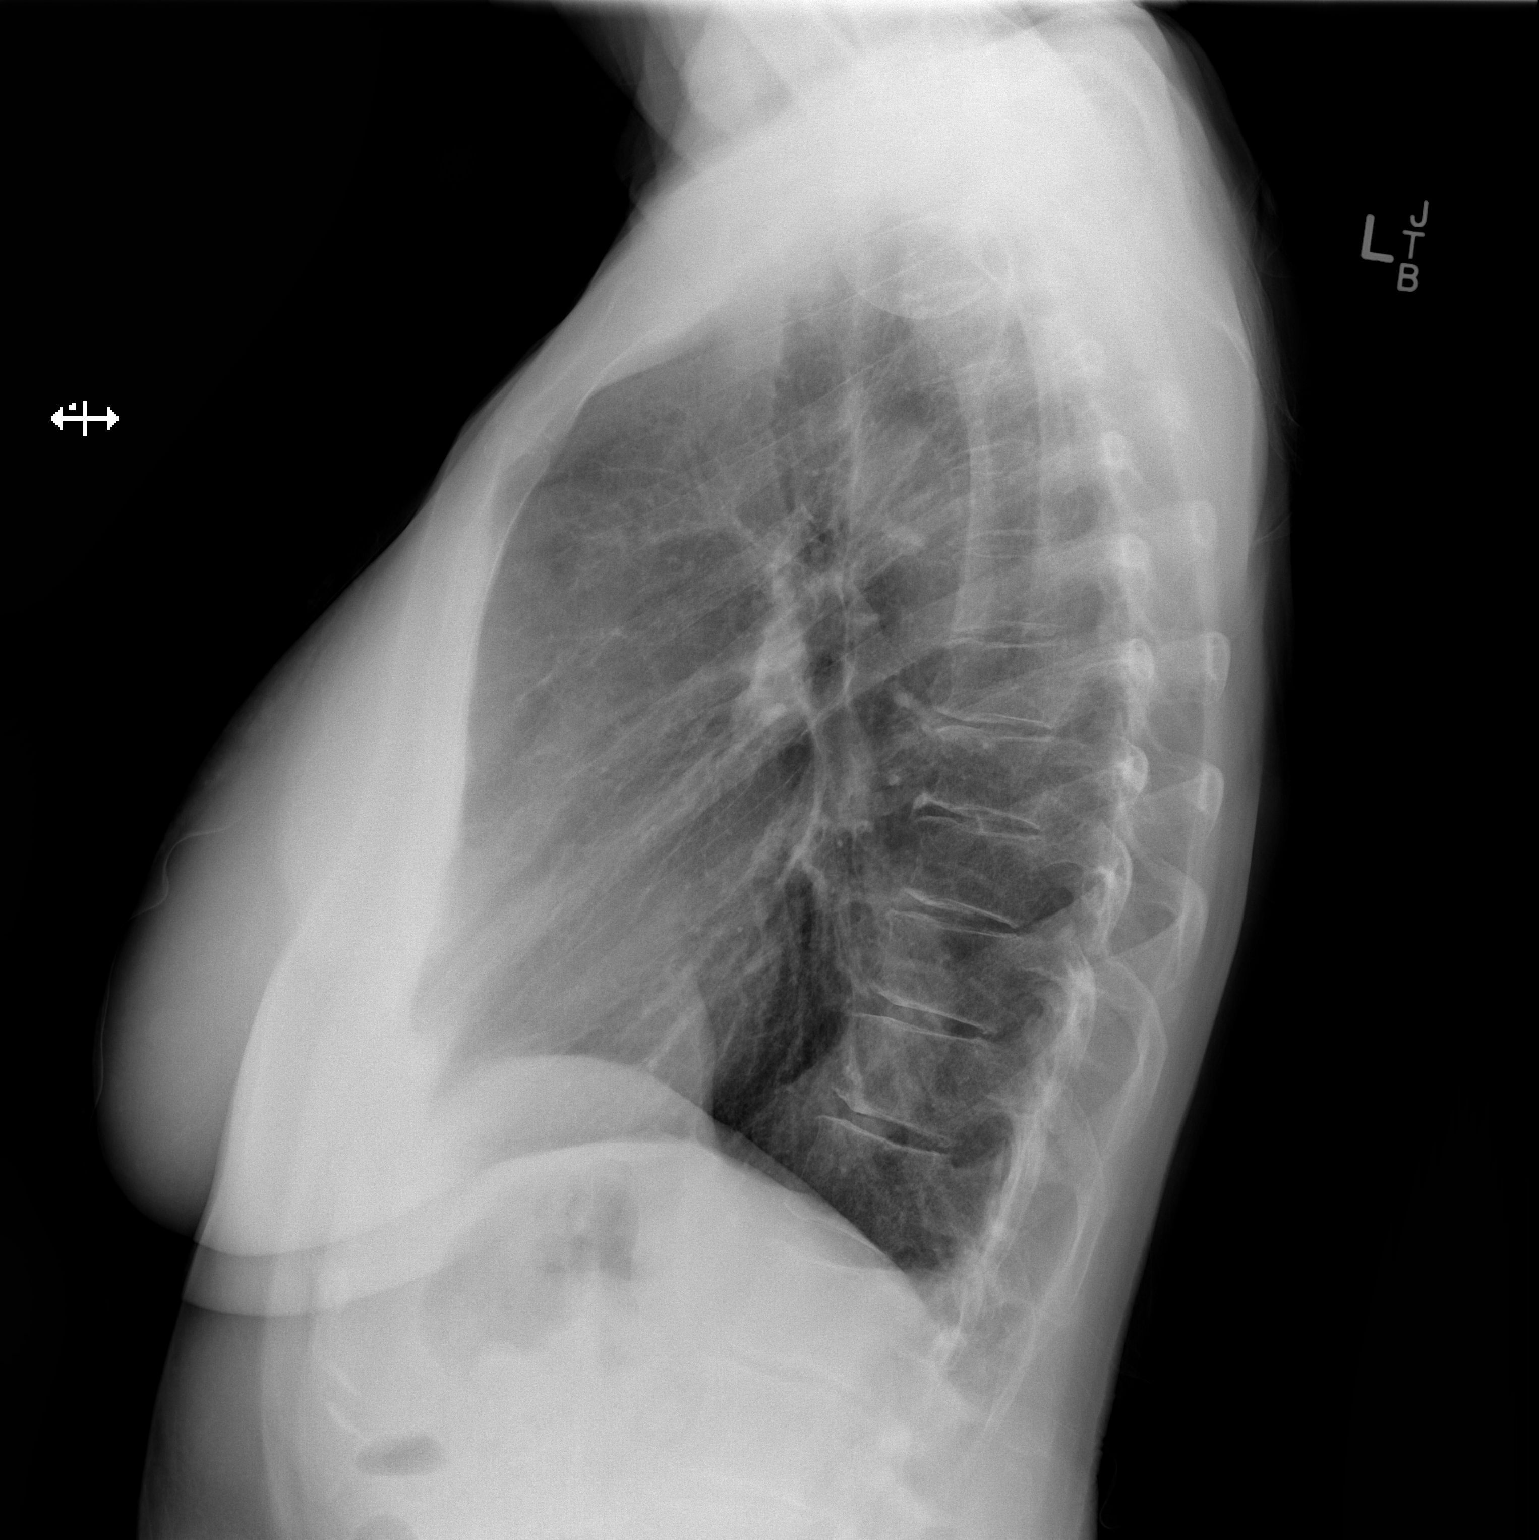

[2 of 2 positions shown; findings below may reference images not displayed]

FINDINGS: The lungs are well-expanded. There is no focal infiltrate. There is
subtle density that projects on the left in the posterior aspect of
the upper lobe. This may reflect a nodule measuring approximately 8
x 12 x 5 mm.There is no pleural effusion. The heart and pulmonary
vascularity are normal. The mediastinum is normal in width. There is
mild tortuosity of the descending thoracic aorta. The bony thorax
exhibits no acute abnormality.
IMPRESSION: No pneumonia nor CHF. Subtle nodule posteriorly in the left upper
lobe. Chest CT scanning is recommended to exclude malignancy.

These results will be called to the ordering clinician or
representative by the Radiologist Assistant, and communication
documented in the PACS or zVision Dashboard.

## 2016-09-13 ENCOUNTER — Encounter: Payer: Self-pay | Admitting: Family Medicine

## 2016-09-13 DIAGNOSIS — Z570 Occupational exposure to noise: Secondary | ICD-10-CM | POA: Diagnosis not present

## 2016-09-13 DIAGNOSIS — H903 Sensorineural hearing loss, bilateral: Secondary | ICD-10-CM | POA: Diagnosis not present

## 2016-09-20 DIAGNOSIS — Z87891 Personal history of nicotine dependence: Secondary | ICD-10-CM | POA: Diagnosis not present

## 2016-09-20 DIAGNOSIS — R4701 Aphasia: Secondary | ICD-10-CM | POA: Diagnosis not present

## 2016-09-20 DIAGNOSIS — G459 Transient cerebral ischemic attack, unspecified: Secondary | ICD-10-CM | POA: Diagnosis not present

## 2016-09-20 DIAGNOSIS — Z96651 Presence of right artificial knee joint: Secondary | ICD-10-CM | POA: Diagnosis not present

## 2016-09-20 DIAGNOSIS — R51 Headache: Secondary | ICD-10-CM | POA: Diagnosis not present

## 2016-09-20 DIAGNOSIS — K219 Gastro-esophageal reflux disease without esophagitis: Secondary | ICD-10-CM | POA: Diagnosis not present

## 2016-09-20 DIAGNOSIS — R930 Abnormal findings on diagnostic imaging of skull and head, not elsewhere classified: Secondary | ICD-10-CM | POA: Diagnosis not present

## 2016-09-20 DIAGNOSIS — G319 Degenerative disease of nervous system, unspecified: Secondary | ICD-10-CM | POA: Diagnosis not present

## 2016-09-21 DIAGNOSIS — I088 Other rheumatic multiple valve diseases: Secondary | ICD-10-CM | POA: Diagnosis not present

## 2016-09-21 DIAGNOSIS — K219 Gastro-esophageal reflux disease without esophagitis: Secondary | ICD-10-CM | POA: Diagnosis not present

## 2016-09-21 DIAGNOSIS — G459 Transient cerebral ischemic attack, unspecified: Secondary | ICD-10-CM | POA: Diagnosis not present

## 2016-09-21 DIAGNOSIS — R4701 Aphasia: Secondary | ICD-10-CM | POA: Diagnosis not present

## 2016-09-21 DIAGNOSIS — I519 Heart disease, unspecified: Secondary | ICD-10-CM | POA: Diagnosis not present

## 2016-09-25 ENCOUNTER — Ambulatory Visit: Payer: Medicare Other | Admitting: Family Medicine

## 2016-09-30 ENCOUNTER — Ambulatory Visit (INDEPENDENT_AMBULATORY_CARE_PROVIDER_SITE_OTHER): Payer: Medicare Other | Admitting: Family Medicine

## 2016-09-30 ENCOUNTER — Encounter: Payer: Self-pay | Admitting: Family Medicine

## 2016-09-30 DIAGNOSIS — G459 Transient cerebral ischemic attack, unspecified: Secondary | ICD-10-CM

## 2016-09-30 NOTE — Patient Instructions (Signed)
Transient Ischemic Attack °A transient ischemic attack (TIA) is a "warning stroke" that causes stroke-like symptoms. Unlike a stroke, a TIA does not cause permanent damage to the brain. The symptoms of a TIA can happen very fast and do not last long. It is important to know the symptoms of a TIA and what to do. This can help prevent a major stroke or death. °What are the causes? °A TIA is caused by a temporary blockage in an artery in the brain or neck (carotid artery). The blockage does not allow the brain to get the blood supply it needs and can cause different symptoms. The blockage can be caused by either: °· A blood clot. °· Fatty buildup (plaque) in a neck or brain artery. ° °What increases the risk? °· High blood pressure (hypertension). °· High cholesterol. °· Diabetes mellitus. °· Heart disease. °· The buildup of plaque in the blood vessels (peripheral artery disease or atherosclerosis). °· The buildup of plaque in the blood vessels that provide blood and oxygen to the brain (carotid artery stenosis). °· An abnormal heart rhythm (atrial fibrillation). °· Obesity. °· Using any tobacco products, including cigarettes, chewing tobacco, or electronic cigarettes. °· Taking oral contraceptives, especially in combination with using tobacco. °· Physical inactivity. °· A diet high in fats, salt (sodium), and calories. °· Excessive alcohol use. °· Use of illegal drugs (especially cocaine and methamphetamine). °· Being female. °· Being African American. °· Being over the age of 55 years. °· Family history of stroke. °· Previous history of blood clots, stroke, TIA, or heart attack. °· Sickle cell disease. °What are the signs or symptoms? °TIA symptoms are the same as a stroke but are temporary. These symptoms usually develop suddenly, or may be newly present upon waking from sleep: °· Sudden weakness or numbness of the face, arm, or leg, especially on one side of the body. °· Sudden trouble walking or difficulty moving  arms or legs. °· Sudden confusion. °· Sudden personality changes. °· Trouble speaking (aphasia) or understanding. °· Difficulty swallowing. °· Sudden trouble seeing in one or both eyes. °· Double vision. °· Dizziness. °· Loss of balance or coordination. °· Sudden severe headache with no known cause. °· Trouble reading or writing. °· Loss of bowel or bladder control. °· Loss of consciousness. ° °How is this diagnosed? °Your health care provider may be able to determine the presence or absence of a TIA based on your symptoms, history, and physical exam. CT scan of the brain is usually performed to help identify a TIA. Other tests may include: °· Electrocardiography (ECG). °· Continuous heart monitoring. °· Echocardiography. °· Carotid ultrasonography. °· MRI. °· A scan of the brain circulation. °· Blood tests. ° °How is this treated? °Since the symptoms of TIA are the same as a stroke, it is important to seek treatment as soon as possible. You may need a medicine to dissolve a blood clot (thrombolytic) if that is the cause of the TIA. This medicine cannot be given if too much time has passed. Treatment may also include: °· Rest, oxygen, fluids through an IV tube, and medicines to thin the blood (anticoagulants). °· Measures will be taken to prevent short-term and long-term complications, including infection from breathing foreign material into the lungs (aspiration pneumonia), blood clots in the legs, and falls. °· Procedures to either remove plaque in the carotid arteries or dilate carotid arteries that have narrowed due to plaque. Those procedures are: °? Carotid endarterectomy. °? Carotid angioplasty and stenting. °· Medicines   and diet may be used to address diabetes, high blood pressure, and other underlying risk factors. ° °Follow these instructions at home: °· Take medicines only as directed by your health care provider. Follow the directions carefully. Medicines may be used to control risk factors for a stroke.  Be sure you understand all your medicine instructions. °· You may be told to take aspirin or the anticoagulant warfarin. Warfarin needs to be taken exactly as instructed. °? Taking too much or too little warfarin is dangerous. Too much warfarin increases the risk of bleeding. Too little warfarin continues to allow the risk for blood clots. While taking warfarin, you will need to have regular blood tests to measure your blood clotting time. A PT blood test measures how long it takes for blood to clot. Your PT is used to calculate another value called an INR. Your PT and INR help your health care provider to adjust your dose of warfarin. The dose can change for many reasons. It is critically important that you take warfarin exactly as prescribed. °? Many foods, especially foods high in vitamin K can interfere with warfarin and affect the PT and INR. Foods high in vitamin K include spinach, kale, broccoli, cabbage, collard and turnip greens, Brussels sprouts, peas, cauliflower, seaweed, and parsley, as well as beef and pork liver, green tea, and soybean oil. You should eat a consistent amount of foods high in vitamin K. Avoid major changes in your diet, or notify your health care provider before changing your diet. Arrange a visit with a dietitian to answer your questions. °? Many medicines can interfere with warfarin and affect the PT and INR. You must tell your health care provider about any and all medicines you take; this includes all vitamins and supplements. Be especially cautious with aspirin and anti-inflammatory medicines. Do not take or discontinue any prescribed or over-the-counter medicine except on the advice of your health care provider or pharmacist. °? Warfarin can have side effects, such as excessive bruising or bleeding. You will need to hold pressure over cuts for longer than usual. Your health care provider or pharmacist will discuss other potential side effects. °? Avoid sports or activities that  may cause injury or bleeding. °? Be careful when shaving, flossing your teeth, or handling sharp objects. °? Alcohol can change the body's ability to handle warfarin. It is best to avoid alcoholic drinks or consume only very small amounts while taking warfarin. Notify your health care provider if you change your alcohol intake. °? Notify your dentist or other health care providers before procedures. °· Eat a diet that includes 5 or more servings of fruits and vegetables each day. This may reduce the risk of stroke. Certain diets may be prescribed to address high blood pressure, high cholesterol, diabetes, or obesity. °? A diet low in sodium, saturated fat, trans fat, and cholesterol is recommended to manage high blood pressure. °? A diet low in saturated fat, trans fat, and cholesterol, and high in fiber may control cholesterol levels. °? A controlled-carbohydrate, controlled-sugar diet is recommended to manage diabetes. °? A reduced-calorie diet that is low in sodium, saturated fat, trans fat, and cholesterol is recommended to manage obesity. °· Maintain a healthy weight. °· Stay physically active. It is recommended that you get at least 30 minutes of activity on most or all days. °· Do not use any tobacco products, including cigarettes, chewing tobacco, or electronic cigarettes. If you need help quitting, ask your health care provider. °· Limit alcohol intake   to no more than 1 drink per day for nonpregnant women and 2 drinks per day for men. One drink equals 12 ounces of beer, 5 ounces of wine, or 1 ounces of hard liquor.  Do not abuse drugs.  A safe home environment is important to reduce the risk of falls. Your health care provider may arrange for specialists to evaluate your home. Having grab bars in the bedroom and bathroom is often important. Your health care provider may arrange for equipment to be used at home, such as raised toilets and a seat for the shower.  Follow all instructions for follow-up  with your health care provider. This is very important. This includes any referrals and lab tests. Proper follow-up can prevent a stroke or another TIA from occurring. How is this prevented? The risk of a TIA can be decreased by appropriately treating high blood pressure, high cholesterol, diabetes, heart disease, and obesity, and by quitting smoking, limiting alcohol, and staying physically active. Contact a health care provider if:  You have personality changes.  You have difficulty swallowing.  You are seeing double.  You have dizziness.  You have a fever. Get help right away if: Any of the following symptoms may represent a serious problem that is an emergency. Do not wait to see if the symptoms will go away. Get medical help right away. Call your local emergency services (911 in [place] Do not drive yourself to the hospital.  You have sudden weakness or numbness of the face, arm, or leg, especially on one side of the body.  You have sudden trouble walking or difficulty moving arms or legs.  You have sudden confusion.  You have trouble speaking (aphasia) or understanding.  You have sudden trouble seeing in one or both eyes.  You have a loss of balance or coordination.  You have a sudden, severe headache with no known cause.  You have new chest pain or an irregular heartbeat.  You have a partial or total loss of consciousness.  This information is not intended to replace advice given to you by your health care provider. Make sure you discuss any questions you have with your health care provider. Document Released: 11/21/2004 Document Revised: 10/16/2015 Document Reviewed: 05/19/2013 Elsevier Interactive Patient Education  2017 [place].  Set up neurology appointment Continue with daily aspirin. Consider starting low dose statin- discuss with neurology.

## 2016-09-30 NOTE — Progress Notes (Signed)
Subjective:     [name], female   DOB: June 08, 1945, 71 y.o.   MRN: [id number]  HPI Patient seen following hospital follow-up for probable TIA this occurred on 09/20/16. She was driving back from [place] with granddaughter and had sudden inability to speak and also had inability to collect her thoughts. She denied any facial weakness or any focal weakness in her extremities. She pulled into a restaurant and states after about 30 minutes her symptoms fully resolved. She then proceeded to drive the rest of way home and when she got home told her husband about events and he insisted that she go to the hospital for further evaluation. She was admitted and neuro symptoms had fully cleared at that point. She had extensive workup. No prior history of cerebrovascular disease. No history of hypertension. No history of diabetes. Nonsmoker. Fairly favorable lipid profile.  Echocardiogram no major abnormalities. Ejection fraction 60-65%. Cardiac monitoring no A. fib or other arrhythmias. Carotid Dopplers negative. CT head and MRI brain no acute abnormalities. Cholesterol 179 with HDL 62 and LDL 105.  MRI showed only some mild atrophy and chronic ischemic changes. No hemorrhage. No infarction. CBC normal. TSH borderline elevated 4.51. A1c 5.1. Chemistries normal.  Patient had not been taking aspirin prior to admission and was discharged on 325 mg daily. She's had no further /recurrent symptoms whatsoever. She was told follow-up with neurologist but has not set up follow-up yet.  Past Medical History:  Diagnosis Date  . Arthritis    right knee and foot- [facility]   . Asthma    treated in 20's and 30's  . 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 Complication of anesthesia    very slow to awaken   . GERD (gastroesophageal reflux disease)    prn Prilosec  . Head injury, closed    10/26/15- years ago - 30ish  . Shortness of breath dyspnea    with exertion  . Vaso vagal episode    passed out 2  times, close to passing out 3 times  . Vertigo    was treated with rehab March and April 2017, does exercises if she begins to have symptoms   Past Surgical History:  Procedure Laterality Date  . ABDOMINAL HYSTERECTOMY  1993   menorrhagia.  Marland Kitchen BREAST ENHANCEMENT SURGERY  1978  . COLONOSCOPY W/ POLYPECTOMY    . KNEE ARTHROSCOPY W/ MENISCAL REPAIR Right   . 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 TUBAL LIGATION  1972  . [place]    reports that she quit smoking about 26 years ago. Her smoking use included Cigarettes. She has a 20.00 pack-year smoking history. She has quit using smokeless tobacco. She reports that she drinks about 3.6 oz of alcohol per week . She reports that she does not use drugs. family history includes Cancer (age of onset: 4) in her father; Heart disease (age of onset: 42) in her mother. Allergies  Allergen Reactions  . Adhesive [Tape] Hives    Use paper tape only     Review of Systems  Constitutional: Negative for fatigue.  Eyes: Negative for visual disturbance.  Respiratory: Negative for cough, chest tightness, shortness of breath and wheezing.   Cardiovascular: Negative for chest pain, palpitations and leg swelling.  Endocrine: Negative for polydipsia and polyuria.  Neurological: Negative for dizziness,  seizures, syncope, weakness, light-headedness and headaches.  Psychiatric/Behavioral: Negative for confusion.       Objective:   Physical Exam  Constitutional: She is oriented to person, place, and time. She appears well-developed and well-nourished.  HENT:  Mouth/Throat: Oropharynx is clear and moist.  Eyes: Pupils are equal, round, and reactive to light.  Neck: Neck supple. No JVD present. No thyromegaly present.  Cardiovascular: Normal rate and regular rhythm.  Exam reveals no gallop.    Pulmonary/Chest: Effort normal and breath sounds normal. No respiratory distress. She has no wheezes. She has no rales.  Musculoskeletal: She exhibits no edema.  Neurological: She is alert and oriented to person, place, and time. No cranial nerve deficit.  Psychiatric: She has a normal mood and affect. Her behavior is normal.       Assessment:     Probable TIA. Patient symptoms fully resolved after 30 minutes. Multiple studies as above unremarkable. Patient has normal blood pressure, no history of diabetes, only minimally elevated LDL cholesterol.    Plan:     -Patient to schedule follow-up with neurology -Continue aspirin daily -We discussed possible statin use and she declines at this point wishes to discuss with neurology first -Follow-up immediately for any recurrent TIA type symptoms or other neurologic concerns  Eulas Post MD [facility]

## 2016-10-01 ENCOUNTER — Telehealth: Payer: Self-pay | Admitting: Family Medicine

## 2016-10-01 DIAGNOSIS — G459 Transient cerebral ischemic attack, unspecified: Secondary | ICD-10-CM

## 2016-10-01 NOTE — Telephone Encounter (Signed)
Okay to refer? 

## 2016-10-01 NOTE — Telephone Encounter (Signed)
Referral placed.

## 2016-10-01 NOTE — Telephone Encounter (Signed)
° ° ° °  Pt daughter call with a referral. She has spoken to them and they just need a referral  Pt appt is 10/07/16   Dr Norwood Levo   [facility]   [facility]   [phone]   Fax number

## 2016-10-07 DIAGNOSIS — G451 Carotid artery syndrome (hemispheric): Secondary | ICD-10-CM | POA: Diagnosis not present

## 2016-10-09 ENCOUNTER — Encounter: Payer: Self-pay | Admitting: Family Medicine

## 2016-10-09 DIAGNOSIS — E785 Hyperlipidemia, unspecified: Secondary | ICD-10-CM

## 2016-10-09 NOTE — Telephone Encounter (Signed)
Note sent to mychart.

## 2016-11-14 ENCOUNTER — Encounter: Payer: Self-pay | Admitting: Family Medicine

## 2016-11-19 ENCOUNTER — Encounter: Payer: Self-pay | Admitting: Family Medicine

## 2016-11-19 ENCOUNTER — Ambulatory Visit (INDEPENDENT_AMBULATORY_CARE_PROVIDER_SITE_OTHER): Payer: Medicare Other | Admitting: Family Medicine

## 2016-11-19 VITALS — BP 92/58 | HR 68 | Temp 97.5°F | Ht 68.0 in | Wt 155.9 lb

## 2016-11-19 DIAGNOSIS — M9909 Segmental and somatic dysfunction of abdomen and other regions: Secondary | ICD-10-CM

## 2016-11-19 DIAGNOSIS — M545 Low back pain, unspecified: Secondary | ICD-10-CM

## 2016-11-19 NOTE — Patient Instructions (Signed)
BEFORE YOU LEAVE: -follow up: follow up in 3-4 weeks  Do the exercises 4-5 days per week - gently.  Heat (rice sock), topical menthol (tiger balm), aleve per instructions as needed for pain.  Can use the muscle relaxer at night for 1 week.  I hope you are feeling better soon! Please follow up sooner if worsening, new concerns or you are not improving with treatment.

## 2016-11-19 NOTE — Progress Notes (Signed)
HPI:  Acute visit for low back pain: -She was pulling out weeds yesterday and reports she reported a muscle -She has had moderate to severe pain in the left upper buttock region -she has tried ice, massage therapy and one dose of muscle relaxer, tizanidine 2 mg she had at home from a knee injury -Remote history of degenerative disc disease and low back pain, treated by chiropractor in the past -Denies fever, malaise, weakness, numbness, bowel or bladder incontinence or radiation of pain  ROS: See pertinent positives and negatives per HPI.  Past Medical History:  Diagnosis Date  . Arthritis    right knee and foot- [facility]   . Asthma    treated in 20's and 30's  . 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 Complication of anesthesia    very slow to awaken   . GERD (gastroesophageal reflux disease)    prn Prilosec  . Head injury, closed    10/26/15- years ago - 30ish  . Shortness of breath dyspnea    with exertion  . Vaso vagal episode    passed out 2 times, close to passing out 3 times  . Vertigo    was treated with rehab March and April 2017, does exercises if she begins to have symptoms    Past Surgical History:  Procedure Laterality Date  . ABDOMINAL HYSTERECTOMY  1993   menorrhagia.  Marland Kitchen BREAST ENHANCEMENT SURGERY  1978  . COLONOSCOPY W/ POLYPECTOMY    . KNEE ARTHROSCOPY W/ MENISCAL REPAIR Right   . 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 TUBAL LIGATION  1972  . VARICOSE VEIN SURGERY  1978    Family History  Problem Relation Age of Onset  . Heart disease Mother 33  . Cancer Father 7       pancreatic    Social History   Social History  . Marital status: Married    Spouse name: N/A  . Number of children: N/A  . Years of education: N/A   Social History Main Topics  . Smoking status: Former Smoker    Packs/day: 1.00    Years:  20.00    Types: Cigarettes    Quit date: 03/24/1990  . Smokeless tobacco: Former Systems developer  . Alcohol use 3.6 oz/week    6 Glasses of wine per week     Comment: ocasional use   . Drug use: No  . Sexual activity: Not Asked   Other Topics Concern  . None   Social History Narrative  . None     Current Outpatient Prescriptions:  .  aspirin EC 325 MG tablet, Take 1 tablet (325 mg total) by mouth 2 (two) times daily after a meal. Take x 1 month post op to decrease risk of blood clots. (Patient taking differently: Take 325 mg by mouth daily. Take x 1 month post op to decrease risk of blood clots.), Disp: 60 tablet, Rfl: 0 .  Calcium Carb-Cholecalciferol (CALCIUM 600 + D PO), Take 2 tablets by mouth daily., Disp: , Rfl:  .  cycloSPORINE (RESTASIS) 0.05 % ophthalmic emulsion, Place 1 drop into both eyes 2 (two) times daily. , Disp: , Rfl:  .  Flaxseed, Linseed, (FLAX SEED OIL) 1000 MG CAPS, Take 1,000 mg by mouth daily., Disp: , Rfl:  .  Ginger, Zingiber officinalis, (GINGER ROOT) 550 MG CAPS, Take 550 mg  by mouth daily., Disp: , Rfl:  .  Multiple Vitamins-Minerals (ONE DAILY WOMENS 50+ PO), Take 1 tablet by mouth daily., Disp: , Rfl:  .  omeprazole (PRILOSEC) 20 MG capsule, Take 20 mg by mouth daily as needed (for acid reflux). , Disp: , Rfl:  .  TURMERIC PO, Take 400 mg by mouth daily., Disp: , Rfl:   EXAM:  Vitals:   11/19/16 1451  BP: (!) 92/58  Pulse: 68  Temp: (!) 97.5 F (36.4 C)    Body mass index is 23.7 kg/m.  GENERAL: vitals reviewed and listed above, alert, oriented, appears well hydrated and in no acute distress  HEENT: atraumatic, conjunttiva clear, no obvious abnormalities on inspection of external nose and ears  NECK: no obvious masses on inspection  LUNGS: clear to auscultation bilaterally, no wheezes, rales or rhonchi, good air movement  CV: HRRR, no peripheral edema  MS: Antalgic gait Normal inspection of back, no obvioussignificant scoliosis or leg length  descrepancy No bony TTP Soft tissue TTP at: 2 tender points left upper gluteus region (Posterior L3 and L4 Lumbar counterstrain tenderpoints) -/+ tests: neg trendelenburg,-facet loading, -SLRT, -CLRT, -FABER, -FADIR Normal muscle strength, sensation to light touch and DTRs in LEs bilaterally  PSYCH: pleasant and cooperative, no obvious depression or anxiety  ASSESSMENT AND PLAN:  Discussed the following assessment and plan:  Acute left-sided low back pain without sciatica  Somatic dysfuncton of other region  -we discussed possible serious and likely etiologies, workup and treatment, treatment risks and return precautions - Muscular strain versus radicular pain felt to be most likely -after this discussion, Zanetta opted for conservative treatment with: osteopathic treatment( see below), heat, home exercises, topical menthol, NSAID, muscle relaxer ( she prefers to take the one she has at home rather than getting a new prescription) -follow up advised 3-4 weeks -of course, we advised Mona  to return or notify a doctor immediately if symptoms worsen or persist or new concerns arise.  PROCEDURE NOTE : OSTEOPATHIC TREATMENT The decision today to treat with gentle Osteopathic Manipulative Therapy  (OMT) was based on physical exam findings. Verbal consent was  obtained after after explanation of risks and benefits. No Cervical HVLA manipulation was performed. After consent was obtained, treatment was  performed as below:      Regions treated: lumbar/gluteal     Techniques used: counterstrain The patient tolerated the treatment well and reported significant reduction in pain and improved mobility following treatment today. Follow up treatment was advised in: 3-4    Patient Instructions  BEFORE YOU LEAVE: -follow up: follow up in 3-4 weeks  Do the exercises 4-5 days per week - gently.  Heat (rice sock), topical menthol (tiger balm), aleve per instructions as needed for  pain.  Can use the muscle relaxer at night for 1 week.  I hope you are feeling better soon! Please follow up sooner if worsening, new concerns or you are not improving with treatment.      Colin Benton R., DO

## 2016-12-10 ENCOUNTER — Encounter: Payer: Self-pay | Admitting: Family Medicine

## 2016-12-10 ENCOUNTER — Ambulatory Visit (INDEPENDENT_AMBULATORY_CARE_PROVIDER_SITE_OTHER): Payer: Medicare Other | Admitting: Family Medicine

## 2016-12-10 VITALS — BP 110/78 | HR 63 | Temp 97.9°F | Wt 152.7 lb

## 2016-12-10 DIAGNOSIS — H6981 Other specified disorders of Eustachian tube, right ear: Secondary | ICD-10-CM

## 2016-12-10 DIAGNOSIS — Z23 Encounter for immunization: Secondary | ICD-10-CM | POA: Diagnosis not present

## 2016-12-10 NOTE — Progress Notes (Signed)
Subjective:     [name], female   DOB: [birth year], 71 y.o.   MRN: [id number]  HPI Patient was seen recently with low back pain. She had an adjustment here and back pain is improved at this time. She has one week history of right ear fullness. She has hearing aids bilaterally. She is concerned about possible cerumen impaction. No ear pain. No dizziness. No acute hearing loss She states for years she's had chronic sinus congestion especially early in the mornings. Frequent sneezing.  Patient still needs flu vaccine.  Patient had TIA several months ago. Followed by neurology. Placed on Lipitor and is overdue for lipid and hepatic panel. Denies any side effects from medication. She remains on aspirin.  Past Medical History:  Diagnosis Date  . Arthritis    right knee and foot- OSteo   . Asthma    treated in 20's and 30's  . Cancer ([place])    skin- squamous basal  . Complication of anesthesia    very slow to awaken   . GERD (gastroesophageal reflux disease)    prn Prilosec  . Head injury, closed    10/26/15- years ago - 30ish  . Shortness of breath dyspnea    with exertion  . Vaso vagal episode    passed out 2 times, close to passing out 3 times  . Vertigo    was treated with rehab March and April 2017, does exercises if she begins to have symptoms   Past Surgical History:  Procedure Laterality Date  . ABDOMINAL HYSTERECTOMY  1993   menorrhagia.  Marland Kitchen BREAST ENHANCEMENT SURGERY  1978  . COLONOSCOPY W/ POLYPECTOMY    . KNEE ARTHROSCOPY W/ MENISCAL REPAIR Right   . TOTAL KNEE ARTHROPLASTY Right 11/03/2015   Procedure: RIGHT TOTAL KNEE ARTHROPLASTY;  Surgeon: Dorna Leitz, MD;  Location: [facility];  Service: Orthopedics;  Laterality: Right;  . TUBAL LIGATION  1972  . [place]    reports that she quit smoking about 26 years ago. Her smoking use included Cigarettes. She has a 20.00 pack-year smoking history. She has quit using smokeless tobacco. She reports  that she drinks about 3.6 oz of alcohol per week . She reports that she does not use drugs. family history includes Cancer (age of onset: 39) in her father; Heart disease (age of onset: 91) in her mother. Allergies  Allergen Reactions  . Adhesive [Tape] Hives    Use paper tape only     Review of Systems  Constitutional: Negative for fatigue.  HENT: Positive for congestion and rhinorrhea. Negative for ear discharge and ear pain.   Eyes: Negative for visual disturbance.  Respiratory: Negative for cough, chest tightness, shortness of breath and wheezing.   Cardiovascular: Negative for chest pain, palpitations and leg swelling.  Neurological: Negative for dizziness, seizures, syncope, weakness, light-headedness and headaches.       Objective:   Physical Exam  Constitutional: She appears well-developed and well-nourished.  HENT:  Right Ear: External ear normal.  Only very minimal cerumen left canal not obscuring eardrum. Both eardrums appear normal. Oropharynx clear  Neck: Neck supple. No thyromegaly present.  Cardiovascular: Normal rate and regular rhythm.   Pulmonary/Chest: Effort normal and breath sounds normal. No respiratory distress. She has no wheezes. She has no rales.  Musculoskeletal: She exhibits no edema.  Lymphadenopathy:    She has no cervical adenopathy.       Assessment:     #1 right ear fullness.  No cerumen. No evidence for effusion. Question eustachian tube dysfunction.  #2 probable perennial allergic rhinitis  #3 history of TIA and mild hyperlipidemia    Plan:     -Patient to schedule fasting labs with lipid panel and hepatic panel -Consider trial of Nasacort AQ or Flonase for nasal congestive symptoms -Flu vaccine given  Eulas Post MD [facility]

## 2016-12-10 NOTE — Patient Instructions (Addendum)
Consider trial of Flonase or Nasacort for nasal congestion symptoms. You do not have any significant wax at this time.   Schedule labs (lipid and hepatic panel) for sometime in November.

## 2016-12-16 DIAGNOSIS — F411 Generalized anxiety disorder: Secondary | ICD-10-CM | POA: Diagnosis not present

## 2016-12-23 DIAGNOSIS — F411 Generalized anxiety disorder: Secondary | ICD-10-CM | POA: Diagnosis not present

## 2017-01-07 DIAGNOSIS — M25511 Pain in right shoulder: Secondary | ICD-10-CM | POA: Diagnosis not present

## 2017-01-07 DIAGNOSIS — M542 Cervicalgia: Secondary | ICD-10-CM | POA: Diagnosis not present

## 2017-01-08 ENCOUNTER — Other Ambulatory Visit (INDEPENDENT_AMBULATORY_CARE_PROVIDER_SITE_OTHER): Payer: Medicare Other

## 2017-01-08 DIAGNOSIS — E785 Hyperlipidemia, unspecified: Secondary | ICD-10-CM

## 2017-01-08 LAB — LIPID PANEL
CHOL/HDL RATIO: 2
Cholesterol: 163 mg/dL (ref 0–200)
HDL: 79.6 mg/dL (ref >39.00)
LDL CALC: 76 mg/dL (ref 0–99)
NONHDL: 83.1
TRIGLYCERIDES: 37 mg/dL (ref 0.0–149.0)
VLDL: 7.4 mg/dL (ref 0.0–40.0)

## 2017-01-08 LAB — HEPATIC FUNCTION PANEL
ALBUMIN: 4.1 g/dL (ref 3.5–5.2)
ALK PHOS: 71 U/L (ref 39–117)
ALT: 32 U/L (ref 0–35)
AST: 37 U/L (ref 0–37)
Bilirubin, Direct: 0.2 mg/dL (ref 0.0–0.3)
TOTAL PROTEIN: 6.8 g/dL (ref 6.0–8.3)
Total Bilirubin: 0.6 mg/dL (ref 0.2–1.2)

## 2017-01-10 DIAGNOSIS — L570 Actinic keratosis: Secondary | ICD-10-CM | POA: Diagnosis not present

## 2017-01-10 DIAGNOSIS — Z85828 Personal history of other malignant neoplasm of skin: Secondary | ICD-10-CM | POA: Diagnosis not present

## 2017-01-10 DIAGNOSIS — L821 Other seborrheic keratosis: Secondary | ICD-10-CM | POA: Diagnosis not present

## 2017-01-10 DIAGNOSIS — L718 Other rosacea: Secondary | ICD-10-CM | POA: Diagnosis not present

## 2017-01-21 ENCOUNTER — Encounter: Payer: Self-pay | Admitting: Family Medicine

## 2017-01-21 ENCOUNTER — Ambulatory Visit (INDEPENDENT_AMBULATORY_CARE_PROVIDER_SITE_OTHER): Payer: Medicare Other | Admitting: Family Medicine

## 2017-01-21 VITALS — BP 120/78 | HR 73 | Temp 98.2°F | Wt 157.0 lb

## 2017-01-21 DIAGNOSIS — R3915 Urgency of urination: Secondary | ICD-10-CM | POA: Diagnosis not present

## 2017-01-21 DIAGNOSIS — R300 Dysuria: Secondary | ICD-10-CM

## 2017-01-21 LAB — POCT URINALYSIS DIPSTICK
Bilirubin, UA: NEGATIVE
Glucose, UA: NEGATIVE
Ketones, UA: NEGATIVE
Nitrite, UA: NEGATIVE
Protein, UA: NEGATIVE
Spec Grav, UA: 1.01 (ref 1.010–1.025)
Urobilinogen, UA: 0.2 E.U./dL
pH, UA: 6 (ref 5.0–8.0)

## 2017-01-21 MED ORDER — CIPROFLOXACIN HCL 500 MG PO TABS: 500.0000 mg | ORAL_TABLET | Freq: Two times a day (BID) | ORAL | 0 refills | Status: DC

## 2017-01-21 MED ORDER — SOLIFENACIN SUCCINATE 5 MG PO TABS: 5.0000 mg | ORAL_TABLET | Freq: Every day | ORAL | 11 refills | Status: DC

## 2017-01-21 NOTE — Patient Instructions (Signed)

## 2017-01-21 NOTE — Progress Notes (Signed)
Subjective:     [name], female   DOB: [birth year], 71 y.o.   MRN: [id number]  HPI Patient seen with urinary symptoms of burning, frequency, and urgency over the past few days. She has background of urine urgency which have been progressive for several weeks but the burning started a few days ago. She also describes one episode of blood in her urine yesterday but has not seen any today. No flank pain. No nausea or vomiting. No fever. No vaginal bleeding  Patient previously did trial of Myrbetriq but did not seem to help her urgency. She does drink some caffeine but his tried to scale back. Urgency is becoming more of a problem with things like travel. She would like to explore other medication options other than Myrbetriq.  Past Medical History:  Diagnosis Date  . Arthritis    right knee and foot- OSteo   . Asthma    treated in 20's and 30's  . Cancer ([place])    skin- squamous basal  . Complication of anesthesia    very slow to awaken   . GERD (gastroesophageal reflux disease)    prn Prilosec  . Head injury, closed    10/26/15- years ago - 30ish  . Shortness of breath dyspnea    with exertion  . Vaso vagal episode    passed out 2 times, close to passing out 3 times  . Vertigo    was treated with rehab March and April 2017, does exercises if she begins to have symptoms   Past Surgical History:  Procedure Laterality Date  . ABDOMINAL HYSTERECTOMY  1993   menorrhagia.  Marland Kitchen BREAST ENHANCEMENT SURGERY  1978  . COLONOSCOPY W/ POLYPECTOMY    . KNEE ARTHROSCOPY W/ MENISCAL REPAIR Right   . TOTAL KNEE ARTHROPLASTY Right 11/03/2015   Procedure: RIGHT TOTAL KNEE ARTHROPLASTY;  Surgeon: Dorna Leitz, MD;  Location: [facility];  Service: Orthopedics;  Laterality: Right;  . TUBAL LIGATION  1972  . [place]    reports that she quit smoking about 26 years ago. Her smoking use included cigarettes. She has a 20.00 pack-year smoking history. She has quit using  smokeless tobacco. She reports that she drinks about 3.6 oz of alcohol per week. She reports that she does not use drugs. family history includes Cancer (age of onset: 56) in her father; Heart disease (age of onset: 52) in her mother. Allergies  Allergen Reactions  . Adhesive [Tape] Hives    Use paper tape only     Review of Systems  Constitutional: Negative for chills and fever.  Gastrointestinal: Negative for abdominal pain.  Genitourinary: Positive for dysuria, frequency and urgency. Negative for flank pain.       Objective:   Physical Exam  Constitutional: She appears well-developed and well-nourished. No distress.  Cardiovascular: Normal rate and regular rhythm.  Pulmonary/Chest: Effort normal and breath sounds normal. No respiratory distress. She has no wheezes. She has no rales.       Assessment:     #1 dysuria. Rule out UTI. Urine dipstick reveals trace leukocytes and trace blood  #2 urinary urgency-which preceded her UTI symptoms    Plan:     -Send urine for culture and microscopy -Start Cipro 500 mg twice a day for 5 days pending culture results -Increase fluid consumption -Consider trial of Vesicare 5 mg once daily after her UTI symptoms have improved -Minimize caffeine intake.  Eulas Post MD [facility]  [facility]

## 2017-01-22 LAB — URINALYSIS, ROUTINE W REFLEX MICROSCOPIC
Bilirubin Urine: NEGATIVE
Ketones, ur: NEGATIVE
Nitrite: NEGATIVE
PH: 6.5 (ref 5.0–8.0)
RBC / HPF: NONE SEEN (ref 0–?)
TOTAL PROTEIN, URINE-UPE24: NEGATIVE
Urine Glucose: NEGATIVE
Urobilinogen, UA: 0.2 (ref 0.0–1.0)

## 2017-01-26 LAB — URINE CULTURE
MICRO NUMBER: 81330193
SPECIMEN QUALITY: ADEQUATE

## 2017-01-30 DIAGNOSIS — K529 Noninfective gastroenteritis and colitis, unspecified: Secondary | ICD-10-CM | POA: Diagnosis not present

## 2017-01-30 DIAGNOSIS — Z7982 Long term (current) use of aspirin: Secondary | ICD-10-CM | POA: Diagnosis not present

## 2017-01-30 DIAGNOSIS — R1084 Generalized abdominal pain: Secondary | ICD-10-CM | POA: Diagnosis not present

## 2017-01-30 DIAGNOSIS — K625 Hemorrhage of anus and rectum: Secondary | ICD-10-CM | POA: Diagnosis not present

## 2017-01-30 DIAGNOSIS — R5383 Other fatigue: Secondary | ICD-10-CM | POA: Diagnosis not present

## 2017-01-30 DIAGNOSIS — E785 Hyperlipidemia, unspecified: Secondary | ICD-10-CM | POA: Diagnosis not present

## 2017-01-30 DIAGNOSIS — N390 Urinary tract infection, site not specified: Secondary | ICD-10-CM | POA: Diagnosis not present

## 2017-01-30 DIAGNOSIS — K219 Gastro-esophageal reflux disease without esophagitis: Secondary | ICD-10-CM | POA: Diagnosis not present

## 2017-01-30 DIAGNOSIS — Z87891 Personal history of nicotine dependence: Secondary | ICD-10-CM | POA: Diagnosis not present

## 2017-02-01 ENCOUNTER — Other Ambulatory Visit: Payer: Self-pay

## 2017-02-01 ENCOUNTER — Observation Stay (HOSPITAL_COMMUNITY): Payer: Medicare Other

## 2017-02-01 ENCOUNTER — Encounter (HOSPITAL_COMMUNITY): Payer: Self-pay | Admitting: Emergency Medicine

## 2017-02-01 ENCOUNTER — Inpatient Hospital Stay (HOSPITAL_COMMUNITY)
Admission: EM | Admit: 2017-02-01 | Discharge: 2017-02-03 | DRG: 393 | Disposition: A | Discharge status: Home / Self Care | Payer: Medicare Other | Attending: Internal Medicine | Admitting: Internal Medicine

## 2017-02-01 DIAGNOSIS — K5731 Diverticulosis of large intestine without perforation or abscess with bleeding: Secondary | ICD-10-CM | POA: Diagnosis not present

## 2017-02-01 DIAGNOSIS — K219 Gastro-esophageal reflux disease without esophagitis: Secondary | ICD-10-CM | POA: Diagnosis present

## 2017-02-01 DIAGNOSIS — J45909 Unspecified asthma, uncomplicated: Secondary | ICD-10-CM | POA: Diagnosis not present

## 2017-02-01 DIAGNOSIS — M1990 Unspecified osteoarthritis, unspecified site: Secondary | ICD-10-CM | POA: Diagnosis not present

## 2017-02-01 DIAGNOSIS — K922 Gastrointestinal hemorrhage, unspecified: Secondary | ICD-10-CM | POA: Diagnosis not present

## 2017-02-01 DIAGNOSIS — Z90710 Acquired absence of both cervix and uterus: Secondary | ICD-10-CM

## 2017-02-01 DIAGNOSIS — C4492 Squamous cell carcinoma of skin, unspecified: Secondary | ICD-10-CM

## 2017-02-01 DIAGNOSIS — K625 Hemorrhage of anus and rectum: Secondary | ICD-10-CM | POA: Diagnosis present

## 2017-02-01 DIAGNOSIS — Z79899 Other long term (current) drug therapy: Secondary | ICD-10-CM

## 2017-02-01 DIAGNOSIS — K559 Vascular disorder of intestine, unspecified: Principal | ICD-10-CM | POA: Diagnosis present

## 2017-02-01 DIAGNOSIS — F329 Major depressive disorder, single episode, unspecified: Secondary | ICD-10-CM | POA: Diagnosis present

## 2017-02-01 DIAGNOSIS — Z7982 Long term (current) use of aspirin: Secondary | ICD-10-CM

## 2017-02-01 DIAGNOSIS — Z85828 Personal history of other malignant neoplasm of skin: Secondary | ICD-10-CM

## 2017-02-01 DIAGNOSIS — Z96651 Presence of right artificial knee joint: Secondary | ICD-10-CM | POA: Diagnosis present

## 2017-02-01 DIAGNOSIS — R109 Unspecified abdominal pain: Secondary | ICD-10-CM | POA: Diagnosis not present

## 2017-02-01 DIAGNOSIS — Z87891 Personal history of nicotine dependence: Secondary | ICD-10-CM

## 2017-02-01 LAB — CBC WITH DIFFERENTIAL/PLATELET
Basophils Absolute: 0 10*3/uL (ref 0.0–0.1)
Basophils Relative: 0 %
EOS ABS: 0.2 10*3/uL (ref 0.0–0.7)
EOS PCT: 2 %
HCT: 36 % (ref 36.0–46.0)
Hemoglobin: 12.2 g/dL (ref 12.0–15.0)
LYMPHS ABS: 1.8 10*3/uL (ref 0.7–4.0)
Lymphocytes Relative: 17 %
MCH: 29.9 pg (ref 26.0–34.0)
MCHC: 33.9 g/dL (ref 30.0–36.0)
MCV: 88.2 fL (ref 78.0–100.0)
MONO ABS: 0.9 10*3/uL (ref 0.1–1.0)
Monocytes Relative: 8 %
Neutro Abs: 8.1 10*3/uL — ABNORMAL HIGH (ref 1.7–7.7)
Neutrophils Relative %: 73 %
PLATELETS: 277 10*3/uL (ref 150–400)
RBC: 4.08 MIL/uL (ref 3.87–5.11)
RDW: 14.5 % (ref 11.5–15.5)
WBC: 10.9 10*3/uL — AB (ref 4.0–10.5)

## 2017-02-01 LAB — URINALYSIS, ROUTINE W REFLEX MICROSCOPIC
BILIRUBIN URINE: NEGATIVE
Glucose, UA: NEGATIVE mg/dL
KETONES UR: NEGATIVE mg/dL
NITRITE: NEGATIVE
Protein, ur: NEGATIVE mg/dL
SPECIFIC GRAVITY, URINE: 1.002 — AB (ref 1.005–1.030)
pH: 6 (ref 5.0–8.0)

## 2017-02-01 LAB — LACTIC ACID, PLASMA: LACTIC ACID, VENOUS: 1.1 mmol/L (ref 0.5–1.9)

## 2017-02-01 LAB — COMPREHENSIVE METABOLIC PANEL
ALT: 30 U/L (ref 14–54)
ANION GAP: 7 (ref 5–15)
AST: 44 U/L — ABNORMAL HIGH (ref 15–41)
Albumin: 3.2 g/dL — ABNORMAL LOW (ref 3.5–5.0)
Alkaline Phosphatase: 71 U/L (ref 38–126)
BUN: 6 mg/dL (ref 6–20)
CHLORIDE: 104 mmol/L (ref 101–111)
CO2: 28 mmol/L (ref 22–32)
CREATININE: 0.65 mg/dL (ref 0.44–1.00)
Calcium: 8.7 mg/dL — ABNORMAL LOW (ref 8.9–10.3)
Glucose, Bld: 94 mg/dL (ref 65–99)
POTASSIUM: 4.2 mmol/L (ref 3.5–5.1)
SODIUM: 139 mmol/L (ref 135–145)
Total Bilirubin: 0.4 mg/dL (ref 0.3–1.2)
Total Protein: 6.2 g/dL — ABNORMAL LOW (ref 6.5–8.1)

## 2017-02-01 LAB — PROTIME-INR
INR: 0.95
PROTHROMBIN TIME: 12.6 s (ref 11.4–15.2)

## 2017-02-01 LAB — POC OCCULT BLOOD, ED: FECAL OCCULT BLD: POSITIVE — AB

## 2017-02-01 MED ORDER — CYCLOSPORINE 0.05 % OP EMUL
1.0000 [drp] | Freq: Two times a day (BID) | OPHTHALMIC | Status: DC
  Administered 2017-02-01 – 2017-02-03 (×4): 1 [drp] via OPHTHALMIC
  Filled 2017-02-01 (×4): qty 1

## 2017-02-01 MED ORDER — ONDANSETRON HCL 4 MG/2ML IJ SOLN: 4.0000 mg | Freq: Four times a day (QID) | INTRAMUSCULAR | Status: DC | PRN

## 2017-02-01 MED ORDER — IOPAMIDOL (ISOVUE-300) INJECTION 61%
INTRAVENOUS | Status: AC
  Administered 2017-02-01: 100 mL via INTRAVENOUS
  Filled 2017-02-01: qty 30

## 2017-02-01 MED ORDER — PANTOPRAZOLE SODIUM 40 MG IV SOLR
40.0000 mg | Freq: Two times a day (BID) | INTRAVENOUS | Status: DC
  Administered 2017-02-01 – 2017-02-03 (×4): 40 mg via INTRAVENOUS
  Filled 2017-02-01 (×4): qty 40

## 2017-02-01 MED ORDER — IOPAMIDOL (ISOVUE-300) INJECTION 61%
INTRAVENOUS | Status: AC
  Filled 2017-02-01: qty 100

## 2017-02-01 MED ORDER — ATORVASTATIN CALCIUM 10 MG PO TABS
10.0000 mg | ORAL_TABLET | Freq: Every day | ORAL | Status: DC
  Administered 2017-02-01 – 2017-02-03 (×3): 10 mg via ORAL
  Filled 2017-02-01 (×3): qty 1

## 2017-02-01 NOTE — ED Provider Notes (Signed)
[facility] DEPT Provider Note   CSN: [id number] Arrival date & time: 02/01/17  1302     History   Chief Complaint Chief Complaint  Patient presents with  . GI Bleeding    HPI Mia Peterson is a 71 y.o. female w PMHx GERD, asthma, TIA, and recent admission for GI bleed, to the ED with recurrent symptoms of hematochezia began this morning. Reports soft stools with bright small-moderate amount of bright red blood this morning with assoc lower abd cramping. Denies urinary sx, hemorrhoids, F/C, pelvic complaints, or other sx today. States she was in [place], [place] this past week and bloody stools.  She was admitted on Wednesday, 01/29/2017 and discharged on 01/31/2017.  She states she did not receive any imaging or endoscopy procedures.  She was treated conservatively.  Per documentation patient provided, treated conservatively with NPO diet, protonix. She was discharged after having 2 bowel movements without blood. She has follow up appointment with Dr. Collene Mares after christmas. Per documents, c-diff was neg, stool cultures were neg, hbg remained stable.  Reports recent colonoscopy within 1 year, with 2 polyps removed.  Patient takes aspirin daily. The history is provided by the patient.    Past Medical History:  Diagnosis Date  . Arthritis    right knee and foot- OSteo   . Asthma    treated in 20's and 30's  . Cancer ([place])    skin- squamous basal  . Complication of anesthesia    very slow to awaken   . GERD (gastroesophageal reflux disease)    prn Prilosec  . Head injury, closed    10/26/15- years ago - 30ish  . Shortness of breath dyspnea    with exertion  . Vaso vagal episode    passed out 2 times, close to passing out 3 times  . Vertigo    was treated with rehab March and April 2017, does exercises if she begins to have symptoms    Patient Active Problem List   Diagnosis Date Noted  . TIA (transient ischemic attack) 09/30/2016  . Primary  osteoarthritis of right knee 11/03/2015  . Basal cell cancer 11/23/2010  . Squamous cell skin cancer 11/23/2010  . HEMATURIA UNSPECIFIED 10/09/2009  . 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 URGENCY OF URINATION 11/11/2008  . POSTMENOPAUSAL STATUS 11/11/2008  . DEPRESSIVE DISORDER NOT ELSEWHERE CLASSIFIED 07/27/2008  . GERD 07/27/2008    Past Surgical History:  Procedure Laterality Date  . ABDOMINAL HYSTERECTOMY  1993   menorrhagia.  Marland Kitchen BREAST ENHANCEMENT SURGERY  1978  . COLONOSCOPY W/ POLYPECTOMY    . KNEE ARTHROSCOPY W/ MENISCAL REPAIR Right   . TOTAL KNEE ARTHROPLASTY Right 11/03/2015   Procedure: RIGHT TOTAL KNEE ARTHROPLASTY;  Surgeon: Dorna Leitz, MD;  Location: [facility];  Service: Orthopedics;  Laterality: Right;  . TUBAL LIGATION  1972  . VARICOSE VEIN SURGERY  1978    OB History    No data available       Home Medications    Prior to Admission medications   Medication Sig Start Date End Date Taking? Authorizing Provider  aspirin EC 81 MG tablet Take 81 mg by mouth daily.   Yes [provider]  atorvastatin (LIPITOR) 10 MG tablet Take 10 mg by mouth daily.   Yes [provider]  Calcium Carb-Cholecalciferol (CALCIUM 600 + D PO) Take 2 tablets by mouth daily.   Yes [provider]  cycloSPORINE (RESTASIS) 0.05 % ophthalmic emulsion Place 1 drop into both eyes 2 (  two) times daily.    Yes [provider]  Flaxseed, Linseed, (FLAX SEED OIL) 1000 MG CAPS Take 1,000 mg by mouth daily.   Yes [provider]  Ginger, Zingiber officinalis, (GINGER ROOT) 550 MG CAPS Take 550 mg by mouth daily.   Yes [provider]  Multiple Vitamins-Minerals (ONE DAILY WOMENS 50+ PO) Take 1 tablet by mouth daily.   Yes [provider]  pantoprazole (PROTONIX) 40 MG tablet Take 40-80 mg by mouth daily. 40 mg twice daily for one week, then 40 mg daily until finished 01/31/17  Yes [provider]  POTASSIUM PO Take 1 tablet by mouth daily.   Yes  [provider]  TURMERIC PO Take 400 mg by mouth daily.   Yes [provider]  ciprofloxacin (CIPRO) 500 MG tablet Take 1 tablet (500 mg total) by mouth 2 (two) times daily. Patient not taking: Reported on 02/01/2017 01/21/17   Eulas Post, MD  solifenacin (VESICARE) 5 MG tablet Take 1 tablet (5 mg total) by mouth daily. Patient not taking: Reported on 02/01/2017 01/21/17   Eulas Post, MD    Family History Family History  Problem Relation Age of Onset  . Heart disease Mother 73  . Cancer Father 75       pancreatic    Social History Social History   Tobacco Use  . Smoking status: Former Smoker    Packs/day: 1.00    Years: 20.00    Pack years: 20.00    Types: Cigarettes    Last attempt to quit: 03/24/1990    Years since quitting: 26.8  . Smokeless tobacco: Former Network engineer Use Topics  . Alcohol use: Yes    Alcohol/week: 3.6 oz    Types: 6 Glasses of wine per week    Comment: ocasional use   . Drug use: No     Allergies   Adhesive [tape]   Review of Systems Review of Systems  Constitutional: Negative for chills and fever.  Respiratory: Negative for shortness of breath.   Cardiovascular: Negative for chest pain.  Gastrointestinal: Positive for abdominal pain, blood in stool and diarrhea. Negative for constipation, nausea and vomiting.  Genitourinary: Negative for dysuria and frequency.  All other systems reviewed and are negative.    Physical Exam Updated Vital Signs BP (!) 147/83 (BP Location: Left Arm)   Pulse 77   Temp (!) 97.4 F (36.3 C) (Oral) Comment: pt has been eating Ice chips  Resp 19   SpO2 100%   Physical Exam  Constitutional: She appears well-developed and well-nourished. No distress.  HENT:  Head: Normocephalic and atraumatic.  Mouth/Throat: Oropharynx is clear and moist.  Eyes: Conjunctivae are normal.  Cardiovascular: Normal rate, regular rhythm, normal heart sounds and intact distal pulses.    Pulmonary/Chest: Effort normal and breath sounds normal.  Abdominal: Soft. Bowel sounds are normal. She exhibits no distension and no mass. There is tenderness (b/l lower abdominal tenderness). There is no rebound and no guarding.  Neurological: She is alert.  Skin: Skin is warm.  Psychiatric: She has a normal mood and affect. Her behavior is normal.  Nursing note and vitals reviewed.    ED Treatments / Results  Labs (all labs ordered are listed, but only abnormal results are displayed) Labs Reviewed  CBC WITH DIFFERENTIAL/PLATELET - Abnormal; Notable for the following components:      Result Value   WBC 10.9 (*)    Neutro Abs 8.1 (*)    All  other components within normal limits  COMPREHENSIVE METABOLIC PANEL - Abnormal; Notable for the following components:   Calcium 8.7 (*)    Total Protein 6.2 (*)    Albumin 3.2 (*)    AST 44 (*)    All other components within normal limits  URINALYSIS, ROUTINE W REFLEX MICROSCOPIC - Abnormal; Notable for the following components:   Color, Urine STRAW (*)    Specific Gravity, Urine 1.002 (*)    Hgb urine dipstick SMALL (*)    Leukocytes, UA TRACE (*)    Bacteria, UA RARE (*)    Squamous Epithelial / LPF 0-5 (*)    All other components within normal limits  PROTIME-INR  POC OCCULT BLOOD, ED    EKG  EKG Interpretation None       Radiology No results found.  Procedures Procedures (including critical care time)  Medications Ordered in ED Medications - No data to display   Initial Impression / Assessment and Plan / ED Course  I have reviewed the triage vital signs and the nursing notes.  Pertinent labs & imaging results that were available during my care of the patient were reviewed by me and considered in my medical decision making (see chart for details).  Clinical Course as of Feb 01 1709  Sat Feb 01, 2017  1643 Spoke with Dr. Ardis Hughs with [facility], taking call for Dr. Collene Mares, recommends medicine admit they will see  patient tomorrow.  [JR]  18 Dr. Karleen Hampshire with hospitalist accepting admission.  [JR]  1707 Hemoccult positive per lab.  [JR]    Clinical Course User Index [JR] Melysa Schroyer, [place] N, PA-C    Pt with hematochezia and lower abdominal cramping since this morning.  Recent admission this week for similar symptoms, however no imaging was done at that time.  On exam, abdomen with mild lower abdominal tenderness, no peritoneal signs or guarding.  Patient is well-appearing not in distress.  Rectal exam without hemorrhoids or gross blood, however positive Hemoccult.  Globin 12.12.  White blood cell count 10.9.  Normal PT/INR.  Patient discussed with Dr. Ardis Hughs, who to call for the patient's GI specialist Dr. Collene Mares.  He recommends medical admission with GI to see patient tomorrow.  Also states she does not need to be n.p.o. overnight.  Dr. Karleen Hampshire with hospitalist accepting admission.  Patient discussed with and seen by Dr. Ellender Hose.  Discussed results, findings, treatment and follow up. Patient advised of return precautions. Patient verbalized understanding and agreed with plan.  Final Clinical Impressions(s) / ED Diagnoses   Final diagnoses:  Acute GI bleeding    ED Discharge Orders    None       Mya Suell, [place] N, PA-C 02/01/17 1710    Duffy Bruce, MD 02/02/17 [phone]

## 2017-02-01 NOTE — ED Triage Notes (Signed)
Patient here from home with complaints of GI bleed. Recently discharged from hospital for same, given IV Protonix and sent home with prescription. Bright red blood noted today.

## 2017-02-01 NOTE — ED Notes (Signed)
ED TO INPATIENT HANDOFF REPORT  Name/Age/Gender Mia Peterson 71 y.o. female  Code Status Code Status History    Date Active Date Inactive Code Status Order ID Comments User Context   11/03/2015 15:02 11/05/2015 19:10 Full Code [id number]  Gary Fleet, PA-C Inpatient      Home/SNF/Other Home  Chief Complaint poss. GI bleed  Level of Care/Admitting Diagnosis ED Disposition    ED Disposition Condition Comment   Admit  Hospital Area: [facility] [[id number]]  Level of Care: Telemetry [5]  Admit to tele based on following criteria: Other see comments  Comments: gi bleed  Diagnosis: Rectal bleed [751700]  Admitting Physician: Hosie Poisson [[id number]]  Attending Physician: Hosie Poisson [[id number]]  PT Class (Do Not Modify): Observation [104]  PT Acc Code (Do Not Modify): Observation [10022]       Medical History Past Medical History:  Diagnosis Date  . Arthritis    right knee and foot- OSteo   . Asthma    treated in 20's and 30's  . Cancer ([facility])    skin- squamous basal  . Complication of anesthesia    very slow to awaken   . GERD (gastroesophageal reflux disease)    prn Prilosec  . Head injury, closed    10/26/15- years ago - 30ish  . Shortness of breath dyspnea    with exertion  . Vaso vagal episode    passed out 2 times, close to passing out 3 times  . Vertigo    was treated with rehab March and April 2017, does exercises if she begins to have symptoms    Allergies Allergies  Allergen Reactions  . Adhesive [Tape] Hives    Use paper tape only    IV Location/Drains/Wounds Patient Lines/Drains/Airways Status   Active Line/Drains/Airways    Name:   Placement date:   Placement time:   Site:   Days:   Peripheral IV 11/03/15 Left;Posterior Hand   11/03/15    0856    Hand   456   Peripheral IV 02/01/17 Left Antecubital   02/01/17    1530    Antecubital   less than 1   Airway 7.5 mm   11/03/15    1040     456   Incision (Closed) 11/03/15 Knee Right    11/03/15    1134     456          Labs/Imaging Results for orders placed or performed during the hospital encounter of 02/01/17 (from the past 48 hour(s))  CBC with Differential     Status: Abnormal   Collection Time: 02/01/17  3:27 PM  Result Value Ref Range   WBC 10.9 (H) 4.0 - 10.5 K/uL   RBC 4.08 3.87 - 5.11 MIL/uL   Hemoglobin 12.2 12.0 - 15.0 g/dL   HCT 36.0 36.0 - 46.0 %   MCV 88.2 78.0 - 100.0 fL   MCH 29.9 26.0 - 34.0 pg   MCHC 33.9 30.0 - 36.0 g/dL   RDW 14.5 11.5 - 15.5 %   Platelets 277 150 - 400 K/uL   Neutrophils Relative % 73 %   Neutro Abs 8.1 (H) 1.7 - 7.7 K/uL   Lymphocytes Relative 17 %   Lymphs Abs 1.8 0.7 - 4.0 K/uL   Monocytes Relative 8 %   Monocytes Absolute 0.9 0.1 - 1.0 K/uL   Eosinophils Relative 2 %   Eosinophils Absolute 0.2 0.0 - 0.7 K/uL   Basophils Relative 0 %   Basophils  Absolute 0.0 0.0 - 0.1 K/uL  Comprehensive metabolic panel     Status: Abnormal   Collection Time: 02/01/17  3:27 PM  Result Value Ref Range   Sodium 139 135 - 145 mmol/L   Potassium 4.2 3.5 - 5.1 mmol/L   Chloride 104 101 - 111 mmol/L   CO2 28 22 - 32 mmol/L   Glucose, Bld 94 65 - 99 mg/dL   BUN 6 6 - 20 mg/dL   Creatinine, Ser 0.65 0.44 - 1.00 mg/dL   Calcium 8.7 (L) 8.9 - 10.3 mg/dL   Total Protein 6.2 (L) 6.5 - 8.1 g/dL   Albumin 3.2 (L) 3.5 - 5.0 g/dL   AST 44 (H) 15 - 41 U/L   ALT 30 14 - 54 U/L   Alkaline Phosphatase 71 38 - 126 U/L   Total Bilirubin 0.4 0.3 - 1.2 mg/dL   GFR calc non Af Amer >60 >60 mL/min   GFR calc Af Amer >60 >60 mL/min    Comment: (NOTE) The eGFR has been calculated using the CKD EPI equation. This calculation has not been validated in all clinical situations. eGFR's persistently <60 mL/min signify possible Chronic Kidney Disease.    Anion gap 7 5 - 15  Protime-INR     Status: None   Collection Time: 02/01/17  3:27 PM  Result Value Ref Range   Prothrombin Time 12.6 11.4 - 15.2 seconds   INR 0.95   Urinalysis, Routine w reflex  microscopic     Status: Abnormal   Collection Time: 02/01/17  3:50 PM  Result Value Ref Range   Color, Urine STRAW (A) YELLOW   APPearance CLEAR CLEAR   Specific Gravity, Urine 1.002 (L) 1.005 - 1.030   pH 6.0 5.0 - 8.0   Glucose, UA NEGATIVE NEGATIVE mg/dL   Hgb urine dipstick SMALL (A) NEGATIVE   Bilirubin Urine NEGATIVE NEGATIVE   Ketones, ur NEGATIVE NEGATIVE mg/dL   Protein, ur NEGATIVE NEGATIVE mg/dL   Nitrite NEGATIVE NEGATIVE   Leukocytes, UA TRACE (A) NEGATIVE   RBC / HPF 0-5 0 - 5 RBC/hpf   WBC, UA 0-5 0 - 5 WBC/hpf   Bacteria, UA RARE (A) NONE SEEN   Squamous Epithelial / LPF 0-5 (A) NONE SEEN  POC occult blood, ED     Status: Abnormal   Collection Time: 02/01/17  5:09 PM  Result Value Ref Range   Fecal Occult Bld POSITIVE (A) NEGATIVE   No results found.  Pending Labs Unresulted Labs (From admission, onward)   None      Vitals/Pain Today's Vitals   02/01/17 1316 02/01/17 1636 02/01/17 1808 02/01/17 1830  BP: (!) 153/91 (!) 147/83 (!) 153/97 140/87  Pulse: 69 77 72 70  Resp: _0 Temp: 98 F (36.7 C) (!) 97.4 F (36.3 C)    TempSrc: Oral Oral    SpO2: 100% 100% 100% 100%    Isolation Precautions No active isolations  Medications Medications - No data to display  Mobility walks

## 2017-02-01 NOTE — ED Notes (Signed)
Paged Dr Collene Mares GI.

## 2017-02-01 NOTE — H&P (Signed)
History and Physical    Mia Peterson:[id number] DOB: 06/09/1945 DOA: 02/01/2017  PCP: Eulas Post, MD  Patient coming from: Home.   I have personally briefly reviewed patient's old medical records in [place]  Chief Complaint: rectal bleed since one week.   HPI: Mia Peterson is a 71 y.o. female with medical history significant of asthma, GERD, squamous cell skin cancer, arthritis, comes in for rectal rectal bleeding, painless since one week. She reports she was admitted on 5th and discharged ont he 7th of December in [place], [place] for the same reasons. She did not get any imaging, or procedure on last admission. She denies nausea, vomiting. But she reports some abdominal cramping before BM. NO FEVER or chills. No headache, dizziness, syncope. No chest pain or sob,or cough. On arrival to ED, she underwent lab work showing  Normal hemoglobin and mild leukocytosis.  Dr Ardis Hughs from [facility] consulted , who will see the patient in am.  She was referred to medical service for admission.   ED Course: lab work revealing normal hemoglobin.   Review of Systems: As per HPI otherwise 10 point review of systems negative.    Past Medical History:  Diagnosis Date  . Arthritis    right knee and foot- OSteo   . Asthma    treated in 20's and 30's  . Cancer ([place])    skin- squamous basal  . Complication of anesthesia    very slow to awaken   . GERD (gastroesophageal reflux disease)    prn Prilosec  . Head injury, closed    10/26/15- years ago - 30ish  . Shortness of breath dyspnea    with exertion  . Vaso vagal episode    passed out 2 times, close to passing out 3 times  . Vertigo    was treated with rehab March and April 2017, does exercises if she begins to have symptoms    Past Surgical History:  Procedure Laterality Date  . ABDOMINAL HYSTERECTOMY  1993   menorrhagia.  [facility] Kitchen BREAST ENHANCEMENT SURGERY  1978  . COLONOSCOPY W/ POLYPECTOMY    . KNEE ARTHROSCOPY W/  MENISCAL REPAIR Right   . TOTAL KNEE ARTHROPLASTY Right 11/03/2015   Procedure: RIGHT TOTAL KNEE ARTHROPLASTY;  Surgeon: Dorna Leitz, MD;  Location: [facility];  Service: Orthopedics;  Laterality: Right;  . TUBAL LIGATION  1972  . [place]     reports that she quit smoking about 26 years ago. Her smoking use included cigarettes. She has a 20.00 pack-year smoking history. She has quit using smokeless tobacco. She reports that she drinks about 3.6 oz of alcohol per week. She reports that she does not use drugs.  Allergies  Allergen Reactions  . Adhesive [Tape] Hives    Use paper tape only    Family History  Problem Relation Age of Onset  . Heart disease Mother 27  . Cancer Father 31       pancreatic   Family history reviewed and not pertinent.  Prior to Admission medications   Medication Sig Start Date End Date Taking? Authorizing Provider  aspirin EC 81 MG tablet Take 81 mg by mouth daily.   Yes [provider]  atorvastatin (LIPITOR) 10 MG tablet Take 10 mg by mouth daily.   Yes [provider]  Calcium Carb-Cholecalciferol (CALCIUM 600 + D PO) Take 2 tablets by mouth daily.   Yes [provider]  cycloSPORINE (RESTASIS) 0.05 % ophthalmic emulsion  Place 1 drop into both eyes 2 (two) times daily.    Yes [provider]  Flaxseed, Linseed, (FLAX SEED OIL) 1000 MG CAPS Take 1,000 mg by mouth daily.   Yes [provider]  Ginger, Zingiber officinalis, (GINGER ROOT) 550 MG CAPS Take 550 mg by mouth daily.   Yes [provider]  Multiple Vitamins-Minerals (ONE DAILY WOMENS 50+ PO) Take 1 tablet by mouth daily.   Yes [provider]  pantoprazole (PROTONIX) 40 MG tablet Take 40-80 mg by mouth daily. 40 mg twice daily for one week, then 40 mg daily until finished 01/31/17  Yes [provider]  POTASSIUM PO Take 1 tablet by mouth daily.   Yes [provider]  TURMERIC PO Take 400 mg by mouth daily.    Yes [provider]  ciprofloxacin (CIPRO) 500 MG tablet Take 1 tablet (500 mg total) by mouth 2 (two) times daily. Patient not taking: Reported on 02/01/2017 01/21/17   Eulas Post, MD  solifenacin (VESICARE) 5 MG tablet Take 1 tablet (5 mg total) by mouth daily. Patient not taking: Reported on 02/01/2017 01/21/17   Eulas Post, MD    Physical Exam: Vitals:   02/01/17 1636 02/01/17 1808 02/01/17 1830 02/01/17 1858  BP: (!) 147/83 (!) 153/97 140/87 (!) 165/79  Pulse: 77 72 70 73  Resp: 19 10 16 18   Temp: (!) 97.4 F (36.3 C)   98.1 F (36.7 C)  TempSrc: Oral   Oral  SpO2: 100% 100% 100% 98%  Weight:    70 kg (154 lb 5.2 oz)  Height:    5\' 7"  (1.702 m)    Constitutional: NAD, calm, comfortable Vitals:   02/01/17 1636 02/01/17 1808 02/01/17 1830 02/01/17 1858  BP: (!) 147/83 (!) 153/97 140/87 (!) 165/79  Pulse: 77 72 70 73  Resp: 19 10 16 18   Temp: (!) 97.4 F (36.3 C)   98.1 F (36.7 C)  TempSrc: Oral   Oral  SpO2: 100% 100% 100% 98%  Weight:    70 kg (154 lb 5.2 oz)  Height:    5\' 7"  (1.702 m)   Eyes: PERRL, lids and conjunctivae normal ENMT: Mucous membranes are moist. Posterior pharynx clear of any exudate or lesions.Normal dentition.  Neck: normal, supple, no masses, no thyromegaly Respiratory: clear to auscultation bilaterally, no wheezing, no crackles. Normal respiratory effort. No accessory muscle use.  Cardiovascular: Regular rate and rhythm, no murmurs / rubs / gallops. No extremity edema. 2+ pedal pulses. No carotid bruits.  Abdomen: no tenderness, no masses palpated. No hepatosplenomegaly. Bowel sounds positive.  Musculoskeletal: no clubbing / cyanosis. No joint deformity upper and lower extremities. Good ROM, no contractures. Normal muscle tone.  Skin: no rashes, lesions, ulcers. No induration Neurologic: CN 2-12 grossly intact. Sensation intact, DTR normal. Strength 5/5 in all 4.  Psychiatric: Normal judgment and insight. Alert and  oriented x 3. Normal mood.     Labs on Admission: I have personally reviewed following labs and imaging studies  CBC: Recent Labs  Lab 02/01/17 1527  WBC 10.9*  NEUTROABS 8.1*  HGB 12.2  HCT 36.0  MCV 88.2  PLT 299   Basic Metabolic Panel: Recent Labs  Lab 02/01/17 1527  NA 139  K 4.2  CL 104  CO2 28  GLUCOSE 94  BUN 6  CREATININE 0.65  CALCIUM 8.7*   GFR: Estimated Creatinine Clearance: 62.7 mL/min (by C-G formula based on SCr of 0.65 mg/dL). Liver Function Tests: Recent Labs  Lab 02/01/17 1527  AST 44*  ALT 30  ALKPHOS 71  BILITOT 0.4  PROT 6.2*  ALBUMIN 3.2*   No results for input(s): LIPASE, AMYLASE in the last 168 hours. No results for input(s): AMMONIA in the last 168 hours. Coagulation Profile: Recent Labs  Lab 02/01/17 1527  INR 0.95   Cardiac Enzymes: No results for input(s): CKTOTAL, CKMB, CKMBINDEX, TROPONINI in the last 168 hours. BNP (last 3 results) No results for input(s): PROBNP in the last 8760 hours. HbA1C: No results for input(s): HGBA1C in the last 72 hours. CBG: No results for input(s): GLUCAP in the last 168 hours. Lipid Profile: No results for input(s): CHOL, HDL, LDLCALC, TRIG, CHOLHDL, LDLDIRECT in the last 72 hours. Thyroid Function Tests: No results for input(s): TSH, T4TOTAL, FREET4, T3FREE, THYROIDAB in the last 72 hours. Anemia Panel: No results for input(s): VITAMINB12, FOLATE, FERRITIN, TIBC, IRON, RETICCTPCT in the last 72 hours. Urine analysis:    Component Value Date/Time   COLORURINE STRAW (A) 02/01/2017 1550   APPEARANCEUR CLEAR 02/01/2017 1550   LABSPEC 1.002 (L) 02/01/2017 1550   PHURINE 6.0 02/01/2017 1550   GLUCOSEU NEGATIVE 02/01/2017 1550   GLUCOSEU NEGATIVE 01/21/2017 1601   HGBUR SMALL (A) 02/01/2017 1550   HGBUR negative 10/23/2009 1102   BILIRUBINUR NEGATIVE 02/01/2017 1550   BILIRUBINUR neg 01/21/2017 1543   KETONESUR NEGATIVE 02/01/2017 1550   PROTEINUR NEGATIVE 02/01/2017 1550    UROBILINOGEN 0.2 01/21/2017 1601   NITRITE NEGATIVE 02/01/2017 1550   LEUKOCYTESUR TRACE (A) 02/01/2017 1550    Radiological Exams on Admission: No results found.  EKG: Independently reviewed.   Assessment/Plan Active Problems:   Rectal bleed   Painless rectal bleed:  Unclear etiology, suspect diverticulosis vs diverticulitis.  Associated with abdominal cramping pain.  CT abd and pelvis to evaluate for malignancy, diverticulitis vs ischemic colitis.  Lactic acid ordered.  Her H&H appears stable.  Repeat cbc in am.  IV PPI BID.  Gi consulted by EDP. Clear liquid diet.     Asthma:  Stable.   GERD: Stable.    Arthritis:  Stable.      DVT prophylaxis: scd's Code Status: full code.  Family Communication: none at bedside.  Disposition Plan: pending evaluation by gi. Consults called: gastroenterology Dr Ardis Hughs.  Admission status: obs/ tele   Hosie Poisson MD [facility] Pager [phone]  If 7PM-7AM, please contact night-coverage [email or web address] Password [email or web address]  02/01/2017, 7:25 PM

## 2017-02-02 DIAGNOSIS — K922 Gastrointestinal hemorrhage, unspecified: Secondary | ICD-10-CM | POA: Diagnosis not present

## 2017-02-02 DIAGNOSIS — Z87891 Personal history of nicotine dependence: Secondary | ICD-10-CM | POA: Diagnosis not present

## 2017-02-02 DIAGNOSIS — K625 Hemorrhage of anus and rectum: Secondary | ICD-10-CM | POA: Diagnosis present

## 2017-02-02 DIAGNOSIS — Z79899 Other long term (current) drug therapy: Secondary | ICD-10-CM | POA: Diagnosis not present

## 2017-02-02 DIAGNOSIS — J45909 Unspecified asthma, uncomplicated: Secondary | ICD-10-CM | POA: Diagnosis present

## 2017-02-02 DIAGNOSIS — K5730 Diverticulosis of large intestine without perforation or abscess without bleeding: Secondary | ICD-10-CM | POA: Diagnosis not present

## 2017-02-02 DIAGNOSIS — K5731 Diverticulosis of large intestine without perforation or abscess with bleeding: Secondary | ICD-10-CM | POA: Diagnosis present

## 2017-02-02 DIAGNOSIS — K559 Vascular disorder of intestine, unspecified: Secondary | ICD-10-CM | POA: Diagnosis present

## 2017-02-02 DIAGNOSIS — K5790 Diverticulosis of intestine, part unspecified, without perforation or abscess without bleeding: Secondary | ICD-10-CM | POA: Diagnosis not present

## 2017-02-02 DIAGNOSIS — R197 Diarrhea, unspecified: Secondary | ICD-10-CM | POA: Diagnosis not present

## 2017-02-02 DIAGNOSIS — Z90710 Acquired absence of both cervix and uterus: Secondary | ICD-10-CM | POA: Diagnosis not present

## 2017-02-02 DIAGNOSIS — R319 Hematuria, unspecified: Secondary | ICD-10-CM | POA: Diagnosis not present

## 2017-02-02 DIAGNOSIS — K219 Gastro-esophageal reflux disease without esophagitis: Secondary | ICD-10-CM | POA: Diagnosis present

## 2017-02-02 DIAGNOSIS — Z96651 Presence of right artificial knee joint: Secondary | ICD-10-CM | POA: Diagnosis present

## 2017-02-02 DIAGNOSIS — M1990 Unspecified osteoarthritis, unspecified site: Secondary | ICD-10-CM | POA: Diagnosis present

## 2017-02-02 DIAGNOSIS — Z7982 Long term (current) use of aspirin: Secondary | ICD-10-CM | POA: Diagnosis not present

## 2017-02-02 DIAGNOSIS — Z85828 Personal history of other malignant neoplasm of skin: Secondary | ICD-10-CM | POA: Diagnosis not present

## 2017-02-02 DIAGNOSIS — F329 Major depressive disorder, single episode, unspecified: Secondary | ICD-10-CM | POA: Diagnosis present

## 2017-02-02 LAB — HEMOGLOBIN AND HEMATOCRIT, BLOOD
HCT: 35.4 % — ABNORMAL LOW (ref 36.0–46.0)
Hemoglobin: 12 g/dL (ref 12.0–15.0)

## 2017-02-02 MED ORDER — PEG-KCL-NACL-NASULF-NA ASC-C 100 G PO SOLR: 1.0000 | Freq: Once | ORAL | Status: DC

## 2017-02-02 MED ORDER — PIPERACILLIN-TAZOBACTAM 3.375 G IVPB
3.3750 g | Freq: Three times a day (TID) | INTRAVENOUS | Status: DC
  Administered 2017-02-02 – 2017-02-03 (×3): 3.375 g via INTRAVENOUS
  Filled 2017-02-02 (×4): qty 50

## 2017-02-02 MED ORDER — PEG-KCL-NACL-NASULF-NA ASC-C 100 G PO SOLR
0.5000 | Freq: Once | ORAL | Status: AC
  Administered 2017-02-02: 100 g via ORAL
  Filled 2017-02-02: qty 1

## 2017-02-02 MED ORDER — PEG-KCL-NACL-NASULF-NA ASC-C 100 G PO SOLR
0.5000 | Freq: Once | ORAL | Status: AC
  Administered 2017-02-03: 100 g via ORAL
  Filled 2017-02-02: qty 1

## 2017-02-02 MED ORDER — POLYVINYL ALCOHOL 1.4 % OP SOLN
1.0000 [drp] | Freq: Two times a day (BID) | OPHTHALMIC | Status: DC | PRN
  Administered 2017-02-03: 1 [drp] via OPHTHALMIC
  Filled 2017-02-02: qty 15

## 2017-02-02 NOTE — Progress Notes (Signed)
PROGRESS NOTE    Mia Peterson  TIW:[id number] DOB: June 17, 1945 DOA: 02/01/2017 PCP: Eulas Post, MD    Brief Narrative:  Mia Peterson is a 71 y.o. female with medical history significant of asthma, GERD, squamous cell skin cancer, arthritis, comes in for rectal rectal bleeding, painless since one week.      Assessment & Plan:   Active Problems:   GERD   Squamous cell skin cancer   Rectal bleed   Painless rectal bleeding: Admitted for observation. She contines to have active bleeding, some old blood clots.  Repeat H&H pending.  CT abd and pelvis does show some colitis, and I started her on IV zosyn.  Her abd cramping has improved.  No nausea or vomiting.  Appreciate GI consult and recommendations.  Will keep her npo after midnight for possible colonoscopy .  If bleeding resolves and her H&H is stable , she wishes to go home in am.        DVT prophylaxis: SCD'S Code Status: full code.  Family Communication: none at bedside.  Disposition Plan: home in am possibly    Consultants:   Gastroenterology.    Procedures: CT abd and pelvis.    Antimicrobials: zosyn.    Subjective: No abd cramping.  No nausea, or vomiting.   Objective: Vitals:   02/01/17 1830 02/01/17 1858 02/01/17 2036 02/02/17 0452  BP: 140/87 (!) 165/79 133/80 134/79  Pulse: 70 73 67 83  Resp: 16 18 20 20   Temp:  98.1 F (36.7 C) 97.8 F (36.6 C) 98.4 F (36.9 C)  TempSrc:  Oral Oral Oral  SpO2: 100% 98% 100% 97%  Weight:  70 kg (154 lb 5.2 oz)    Height:  5' 7"  (1.702 m)      Intake/Output Summary (Last 24 hours) at 02/02/2017 1119 Last data filed at 02/02/2017 0600 Gross per 24 hour  Intake 240 ml  Output -  Net 240 ml   Filed Weights   02/01/17 1858  Weight: 70 kg (154 lb 5.2 oz)    Examination:  General exam: Appears calm and comfortable  Respiratory system: Clear to auscultation. Respiratory effort normal. Cardiovascular system: S1 & S2 heard, RRR. No JVD,  murmurs, rubs, gallops or clicks. No pedal edema. Gastrointestinal system: Abdomen is nondistended, soft and nontender. No organomegaly or masses felt. Normal bowel sounds heard. Central nervous system: Alert and oriented. No focal neurological deficits. Extremities: Symmetric 5 x 5 power. Skin: No rashes, lesions or ulcers Psychiatry: Judgement and insight appear normal. Mood & affect appropriate.     Data Reviewed: I have personally reviewed following labs and imaging studies  CBC: Recent Labs  Lab 02/01/17 1527  WBC 10.9*  NEUTROABS 8.1*  HGB 12.2  HCT 36.0  MCV 88.2  PLT 250   Basic Metabolic Panel: Recent Labs  Lab 02/01/17 1527  NA 139  K 4.2  CL 104  CO2 28  GLUCOSE 94  BUN 6  CREATININE 0.65  CALCIUM 8.7*   GFR: Estimated Creatinine Clearance: 62.7 mL/min (by C-G formula based on SCr of 0.65 mg/dL). Liver Function Tests: Recent Labs  Lab 02/01/17 1527  AST 44*  ALT 30  ALKPHOS 71  BILITOT 0.4  PROT 6.2*  ALBUMIN 3.2*   No results for input(s): LIPASE, AMYLASE in the last 168 hours. No results for input(s): AMMONIA in the last 168 hours. Coagulation Profile: Recent Labs  Lab 02/01/17 1527  INR 0.95   Cardiac Enzymes: No results for input(s): CKTOTAL,  CKMB, CKMBINDEX, TROPONINI in the last 168 hours. BNP (last 3 results) No results for input(s): PROBNP in the last 8760 hours. HbA1C: No results for input(s): HGBA1C in the last 72 hours. CBG: No results for input(s): GLUCAP in the last 168 hours. Lipid Profile: No results for input(s): CHOL, HDL, LDLCALC, TRIG, CHOLHDL, LDLDIRECT in the last 72 hours. Thyroid Function Tests: No results for input(s): TSH, T4TOTAL, FREET4, T3FREE, THYROIDAB in the last 72 hours. Anemia Panel: No results for input(s): VITAMINB12, FOLATE, FERRITIN, TIBC, IRON, RETICCTPCT in the last 72 hours. Sepsis Labs: Recent Labs  Lab 02/01/17 1945  LATICACIDVEN 1.1    No results found for this or any previous visit  (from the past 240 hour(s)).       Radiology Studies: Ct Abdomen Pelvis W Contrast  Result Date: 02/01/2017 CLINICAL DATA:  71 year old female with history of squamous cell carcinoma of the skin presents with pain less rectal bleeding. EXAM: CT ABDOMEN AND PELVIS WITH CONTRAST TECHNIQUE: Multidetector CT imaging of the abdomen and pelvis was performed using the standard protocol following bolus administration of intravenous contrast. CONTRAST:  <See Chart> ISOVUE-300 IOPAMIDOL (ISOVUE-300) INJECTION 61% COMPARISON:  Chest CT dated 11/01/2015 FINDINGS: Lower chest: There is a 3 mm subpleural nodule at the left lung base (Series 2, image 12) similar to prior CT. The visualized lung bases are otherwise clear. There is no intra-abdominal free air or free fluid. Hepatobiliary: Ill-defined area of increased enhancement with apparent central scar in the left lobe of the liver measuring approximately 3 x 3 cm is not well characterized but likely represents a focal FNH versus a hemangioma. Other etiologies are not excluded. Further characterization with nonemergent MRI without and with contrast is recommended. The liver is otherwise unremarkable. No intrahepatic biliary ductal dilatation. The gallbladder is unremarkable. Pancreas: Unremarkable. No pancreatic ductal dilatation or surrounding inflammatory changes. Spleen: Normal in size without focal abnormality. Adrenals/Urinary Tract: The adrenal glands are unremarkable. The kidneys, visualized ureters, and urinary bladder appear unremarkable. Stomach/Bowel: There is a small hiatal hernia. There is mild segmental thickening of the splenic flexure and proximal descending colon which may be chronic or related to underdistention. Colitis is not excluded. Clinical correlation is recommended. Mild edema of the distal transverse colon may also be present. There is no bowel obstruction. The appendix is unremarkable. Vascular/Lymphatic: The aorta is tortuous. No aneurysmal  dilatation or dissection. The origins of the celiac axis, SMA, IMA are patent. No portal venous gas. There is prominence of the junction of the IVC with right atrium similar to prior CT. There is no adenopathy. Reproductive: Hysterectomy.  No pelvic mass. Other: Bilateral inguinal surgical clips noted. Partially visualized bilateral breast implants. Musculoskeletal: Degenerative changes of the spine. No acute osseous pathology. IMPRESSION: 1. Under distention versus colitis involving the transverse and proximal descending colon. Clinical correlation is recommended. No bowel obstruction. Normal appendix. 2. **An incidental finding of potential clinical significance has been found. 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 Further characterization with MRI without and with contrast is recommended.** Electronically Signed   By: Anner Crete M.D.   On: 02/01/2017 23:18        Scheduled Meds: . atorvastatin  10 mg Oral Daily  . cycloSPORINE  1 drop Both Eyes BID  . pantoprazole (PROTONIX) IV  40 mg Intravenous Q12H  . peg 3350 powder  0.5 kit Oral Once   And  . [START ON 02/03/2017] peg 3350 powder  0.5 kit Oral  Once   Continuous Infusions: . piperacillin-tazobactam (ZOSYN)  IV 3.375 g (02/02/17 1050)     LOS: 0 days    Time spent: 30 minutes.     Hosie Poisson, MD [facility] Pager [phone]  If 7PM-7AM, please contact night-coverage [email or web address] Password [email or web address] 02/02/2017, 11:19 AM

## 2017-02-02 NOTE — Progress Notes (Signed)
Pharmacy Antibiotic Note  Mia Peterson is a 71 y.o. female admitted on 02/01/2017 with possible intra-abd infection.  Pharmacy has been consulted for Zosyn dosing.  Plan:   Height: 5\' 7"  (170.2 cm) Weight: 154 lb 5.2 oz (70 kg) IBW/kg (Calculated) : 61.6  Temp (24hrs), Avg:97.9 F (36.6 C), Min:97.4 F (36.3 C), Max:98.4 F (36.9 C)  Recent Labs  Lab 02/01/17 1527 02/01/17 1945  WBC 10.9*  --   CREATININE 0.65  --   LATICACIDVEN  --  1.1    Estimated Creatinine Clearance: 62.7 mL/min (by C-G formula based on SCr of 0.65 mg/dL).    Allergies  Allergen Reactions  . Adhesive [Tape] Hives    Use paper tape only   Antimicrobials this admission: 12/9 Zosyn >>   Microbiology results: None ordered  Thank you for allowing pharmacy to be a part of this patient's care.  Minda Ditto 02/02/2017 9:09 AM

## 2017-02-02 NOTE — H&P (View-Only) (Signed)
[facility] cross-covering for Dr's Selinda Michaels  Referring Provider: Dr. Karleen Hampshire Primary Care Physician:  Eulas Post, MD Primary Gastroenterologist:  Dr. Benson Norway  Reason for Consultation:  Rectal bleeding  HPI: Mia Peterson is a 71 y.o. female with medical history significant of asthma, GERD, squamous cell skin cancer, arthritis who presents to the ED with complaints of rectal bleeding.  She tells me that on 12/3 she had a lot of diarrhea and abdominal cramping.  December 4 she felt pretty good and even exercised.  Then, on Wednesday, December 5, she again developed diarrhea and had some dizziness and almost like a presyncopal episode.  She also developed bloody diarrhea and severe abdominal cramping at the same time.  She was visiting the mountains of [place] so she went to the emergency department there.  She was admitted to the hospital and stool for C. difficile and stool cultures were negative.  She improved and the bleeding seemed to resolve so she was discharged from the hospital on Friday, 12/7.  Then, on Saturday, 12/8 she had a bowel movement with some blood.  She called her PCPs office and was told to report to the emergency department due to the upcoming weather.  Since then she has not passed any further stool, but has passed some blood on a few occasions with some small clots.  Bleeding has definitely lessened and has become darker in color.  This morning she urinated and did not pass any blood from her rectum at that point.    Upon arrival here CT of the abdomen and pelvis with contrast was performed and showed the following:  IMPRESSION: 1. Under distention versus colitis involving the transverse and proximal descending colon. Clinical correlation is recommended. No bowel obstruction. Normal appendix. 2. **An incidental finding of potential clinical significance hasbbeen found. 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 Further  characterization with MRI without and with contrast is recommended.**  Hgb is normal at stable at 12.2 grams.  She has been started on Zosyn by the hospitalist service.  Hgb in 08/2016 was 13.3 grams.  She thinks that her last colonoscopy was 1-2 years ago with Dr. Collene Mares.   Past Medical History:  Diagnosis Date  . Arthritis    right knee and foot- OSteo   . Asthma    treated in 20's and 30's  . Cancer ([place])    skin- squamous basal  . Complication of anesthesia    very slow to awaken   . GERD (gastroesophageal reflux disease)    prn Prilosec  . Head injury, closed    10/26/15- years ago - 30ish  . Shortness of breath dyspnea    with exertion  . Vaso vagal episode    passed out 2 times, close to passing out 3 times  . Vertigo    was treated with rehab March and April 2017, does exercises if she begins to have symptoms    Past Surgical History:  Procedure Laterality Date  . ABDOMINAL HYSTERECTOMY  1993   menorrhagia.  Marland Kitchen BREAST ENHANCEMENT SURGERY  1978  . COLONOSCOPY W/ POLYPECTOMY    . KNEE ARTHROSCOPY W/ MENISCAL REPAIR Right   . TOTAL KNEE ARTHROPLASTY Right 11/03/2015   Procedure: RIGHT TOTAL KNEE ARTHROPLASTY;  Surgeon: Dorna Leitz, MD;  Location: [facility];  Service: Orthopedics;  Laterality: Right;  . TUBAL LIGATION  1972  . [place]    Prior to Admission medications  Medication Sig Start Date End Date Taking? Authorizing Provider  aspirin EC 81 MG tablet Take 81 mg by mouth daily.   Yes [provider]  atorvastatin (LIPITOR) 10 MG tablet Take 10 mg by mouth daily.   Yes [provider]  Calcium Carb-Cholecalciferol (CALCIUM 600 + D PO) Take 2 tablets by mouth daily.   Yes [provider]  cycloSPORINE (RESTASIS) 0.05 % ophthalmic emulsion Place 1 drop into both eyes 2 (two) times daily.    Yes [provider]  Flaxseed, Linseed, (FLAX SEED OIL) 1000 MG CAPS Take 1,000 mg by mouth daily.   Yes [provider]  Ginger, Zingiber officinalis, (GINGER ROOT) 550 MG CAPS Take 550 mg by mouth daily.   Yes [provider]  Multiple Vitamins-Minerals (ONE DAILY WOMENS 50+ PO) Take 1 tablet by mouth daily.   Yes [provider]  pantoprazole (PROTONIX) 40 MG tablet Take 40-80 mg by mouth daily. 40 mg twice daily for one week, then 40 mg daily until finished 01/31/17  Yes [provider]  POTASSIUM PO Take 1 tablet by mouth daily.   Yes [provider]  TURMERIC PO Take 400 mg by mouth daily.   Yes [provider]  ciprofloxacin (CIPRO) 500 MG tablet Take 1 tablet (500 mg total) by mouth 2 (two) times daily. Patient not taking: Reported on 02/01/2017 01/21/17   Eulas Post, MD  solifenacin (VESICARE) 5 MG tablet Take 1 tablet (5 mg total) by mouth daily. Patient not taking: Reported on 02/01/2017 01/21/17   Eulas Post, MD    Current Facility-Administered Medications  Medication Dose Route Frequency Provider Last Rate Last Dose  . atorvastatin (LIPITOR) tablet 10 mg  10 mg Oral Daily Hosie Poisson, MD   10 mg at 02/01/17 2015  . cycloSPORINE (RESTASIS) 0.05 % ophthalmic emulsion 1 drop  1 drop Both Eyes BID Hosie Poisson, MD   1 drop at 02/01/17 2314  . iopamidol (ISOVUE-300) 61 % injection           . ondansetron (ZOFRAN) injection 4 mg  4 mg Intravenous Q6H PRN Hosie Poisson, MD      . pantoprazole (PROTONIX) injection 40 mg  40 mg Intravenous Q12H Hosie Poisson, MD   40 mg at 02/01/17 2016  . piperacillin-tazobactam (ZOSYN) IVPB 3.375 g  3.375 g Intravenous Q8H Green, Terri L, RPH        Allergies as of 02/01/2017 - Review Complete 02/01/2017  Allergen Reaction Noted  . Adhesive [tape] Hives 11/03/2015    Family History  Problem Relation Age of Onset  . Heart disease Mother 4  . Cancer Father 70       pancreatic    Social History   Socioeconomic History  . Marital status: Married    Spouse name: Not on file  . Number of children:  Not on file  . Years of education: Not on file  . Highest education level: Not on file  Social Needs  . Financial resource strain: Not on file  . Food insecurity - worry: Not on file  . Food insecurity - inability: Not on file  . Transportation needs - medical: Not on file  . Transportation needs - non-medical: Not on file  Occupational History  . Not on file  Tobacco Use  . Smoking status: Former Smoker    Packs/day: 1.00    Years: 20.00    Pack years: 20.00    Types: Cigarettes    Last  attempt to quit: 03/24/1990    Years since quitting: 26.8  . Smokeless tobacco: Former Network engineer and Sexual Activity  . Alcohol use: Yes    Alcohol/week: 3.6 oz    Types: 6 Glasses of wine per week    Comment: ocasional use   . Drug use: No  . Sexual activity: Not on file  Other Topics Concern  . Not on file  Social History Narrative  . Not on file    Review of Systems: ROS is O/W negative except as mentioned in HPI.  Physical Exam: Vital signs in last 24 hours: Temp:  [97.4 F (36.3 C)-98.4 F (36.9 C)] 98.4 F (36.9 C) (12/09 0452) Pulse Rate:  [67-83] 83 (12/09 0452) Resp:  [10-20] 20 (12/09 0452) BP: (133-165)/(79-97) 134/79 (12/09 0452) SpO2:  [97 %-100 %] 97 % (12/09 0452) Weight:  [154 lb 5.2 oz (70 kg)] 154 lb 5.2 oz (70 kg) (12/08 1858) Last BM Date: 02/01/17 General:  Alert, Well-developed, well-nourished, pleasant and cooperative in NAD Head:  Normocephalic and atraumatic. Eyes:  Sclera clear, no icterus.  Conjunctiva pink. Ears:  Normal auditory acuity. Mouth:  No deformity or lesions.   Lungs:  Clear throughout to auscultation.  No wheezes, crackles, or rhonchi.  Heart:  Regular rate and rhythm; no murmurs, clicks, rubs, or gallops. Abdomen:  Soft, non-distended.  BS present.  Mild diffuse TTP. Rectal:  Deferred  Msk:  Symmetrical without gross deformities. Pulses:  Normal pulses noted. Extremities:  Without clubbing or edema. Neurologic:  Alert and  oriented x 4;  grossly normal neurologically. Skin:  Intact without significant lesions or rashes. Psych:  Alert and cooperative. Normal mood and affect.  Intake/Output from previous day: 12/08 0701 - 12/09 0700 In: 240 [P.O.:240] Out: -   Lab Results: Recent Labs    02/01/17 1527  WBC 10.9*  HGB 12.2  HCT 36.0  PLT 277   BMET Recent Labs    02/01/17 1527  NA 139  K 4.2  CL 104  CO2 28  GLUCOSE 94  BUN 6  CREATININE 0.65  CALCIUM 8.7*   LFT Recent Labs    02/01/17 1527  PROT 6.2*  ALBUMIN 3.2*  AST 44*  ALT 30  ALKPHOS 71  BILITOT 0.4   PT/INR Recent Labs    02/01/17 1527  LABPROT 12.6  INR 0.95   Studies/Results: Ct Abdomen Pelvis W Contrast  Result Date: 02/01/2017 CLINICAL DATA:  71 year old female with history of squamous cell carcinoma of the skin presents with pain less rectal bleeding. EXAM: CT ABDOMEN AND PELVIS WITH CONTRAST TECHNIQUE: Multidetector CT imaging of the abdomen and pelvis was performed using the standard protocol following bolus administration of intravenous contrast. CONTRAST:  <See Chart> ISOVUE-300 IOPAMIDOL (ISOVUE-300) INJECTION 61% COMPARISON:  Chest CT dated 11/01/2015 FINDINGS: Lower chest: There is a 3 mm subpleural nodule at the left lung base (Series 2, image 12) similar to prior CT. The visualized lung bases are otherwise clear. There is no intra-abdominal free air or free fluid. Hepatobiliary: Ill-defined area of increased enhancement with apparent central scar in the left lobe of the liver measuring approximately 3 x 3 cm is not well characterized but likely represents a focal FNH versus a hemangioma. Other etiologies are not excluded. Further characterization with nonemergent MRI without and with contrast is recommended. The liver is otherwise unremarkable. No intrahepatic biliary ductal dilatation. The gallbladder is unremarkable. Pancreas: Unremarkable. No pancreatic ductal dilatation or surrounding inflammatory changes.  Spleen: Normal in size  without focal abnormality. Adrenals/Urinary Tract: The adrenal glands are unremarkable. The kidneys, visualized ureters, and urinary bladder appear unremarkable. Stomach/Bowel: There is a small hiatal hernia. There is mild segmental thickening of the splenic flexure and proximal descending colon which may be chronic or related to underdistention. Colitis is not excluded. Clinical correlation is recommended. Mild edema of the distal transverse colon may also be present. There is no bowel obstruction. The appendix is unremarkable. Vascular/Lymphatic: The aorta is tortuous. No aneurysmal dilatation or dissection. The origins of the celiac axis, SMA, IMA are patent. No portal venous gas. There is prominence of the junction of the IVC with right atrium similar to prior CT. There is no adenopathy. Reproductive: Hysterectomy.  No pelvic mass. Other: Bilateral inguinal surgical clips noted. Partially visualized bilateral breast implants. Musculoskeletal: Degenerative changes of the spine. No acute osseous pathology. IMPRESSION: 1. Under distention versus colitis involving the transverse and proximal descending colon. Clinical correlation is recommended. No bowel obstruction. Normal appendix. 2. **An incidental finding of potential clinical significance has been found. 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 Further characterization with MRI without and with contrast is recommended.** Electronically Signed   By: Anner Crete M.D.   On: 02/01/2017 23:18   IMPRESSION:  *71 year old female with severe abdominal cramping and bloody diarrhea.  CT suggests some colitis.  Story seems c/w possible ischemic colitis.  Symptoms improved significantly/resolving, although patient still interested in possible colonoscopy.  She is also interested in the plan that will get her out of the hospital the fastest, however. 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  PLAN: *Had a long discussion with the patient.  I do not think that colonoscopy is absolutely necessary and with the snow I do not know what will happen in regards to procedures tomorrow.  We have decided to place her on clear liquids and prep for possible colonoscopy.  If there is no further bleeding and colonoscopy not able to be performed on 12/10 then she could likely eat solid food and be discharged. *Otherwise monitor Hgb. *I think that antibiotics can likely be discontinued.   Laban Emperor. Zehr  02/02/2017, 9:12 AM  Pager number [phone]   ________________________________________________________________________  Velora Heckler GI MD note:  I reviewed the data and agree with the assessment and plan described above. I will be communicating the plan with Dr. Benson Norway this evening at signout.   Owens Loffler, MD [facility] Pager [phone]

## 2017-02-02 NOTE — Consult Note (Signed)
[facility] cross-covering for Dr's Selinda Michaels  Referring Provider: Dr. Karleen Hampshire Primary Care Physician:  Eulas Post, MD Primary Gastroenterologist:  Dr. Benson Norway  Reason for Consultation:  Rectal bleeding  HPI: Mia Peterson is a 71 y.o. female with medical history significant of asthma, GERD, squamous cell skin cancer, arthritis who presents to the ED with complaints of rectal bleeding.  She tells me that on 12/3 she had a lot of diarrhea and abdominal cramping.  December 4 she felt pretty good and even exercised.  Then, on Wednesday, December 5, she again developed diarrhea and had some dizziness and almost like a presyncopal episode.  She also developed bloody diarrhea and severe abdominal cramping at the same time.  She was visiting the mountains of [place] so she went to the emergency department there.  She was admitted to the hospital and stool for C. difficile and stool cultures were negative.  She improved and the bleeding seemed to resolve so she was discharged from the hospital on Friday, 12/7.  Then, on Saturday, 12/8 she had a bowel movement with some blood.  She called her PCPs office and was told to report to the emergency department due to the upcoming weather.  Since then she has not passed any further stool, but has passed some blood on a few occasions with some small clots.  Bleeding has definitely lessened and has become darker in color.  This morning she urinated and did not pass any blood from her rectum at that point.    Upon arrival here CT of the abdomen and pelvis with contrast was performed and showed the following:  IMPRESSION: 1. Under distention versus colitis involving the transverse and proximal descending colon. Clinical correlation is recommended. No bowel obstruction. Normal appendix. 2. **An incidental finding of potential clinical significance hasbbeen found. 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 Further  characterization with MRI without and with contrast is recommended.**  Hgb is normal at stable at 12.2 grams.  She has been started on Zosyn by the hospitalist service.  Hgb in 08/2016 was 13.3 grams.  She thinks that her last colonoscopy was 1-2 years ago with Dr. Collene Mares.   Past Medical History:  Diagnosis Date  . Arthritis    right knee and foot- OSteo   . Asthma    treated in 20's and 30's  . Cancer ([place])    skin- squamous basal  . Complication of anesthesia    very slow to awaken   . GERD (gastroesophageal reflux disease)    prn Prilosec  . Head injury, closed    10/26/15- years ago - 30ish  . Shortness of breath dyspnea    with exertion  . Vaso vagal episode    passed out 2 times, close to passing out 3 times  . Vertigo    was treated with rehab March and April 2017, does exercises if she begins to have symptoms    Past Surgical History:  Procedure Laterality Date  . ABDOMINAL HYSTERECTOMY  1993   menorrhagia.  [facility] BREAST ENHANCEMENT SURGERY  1978  . COLONOSCOPY W/ POLYPECTOMY    . KNEE ARTHROSCOPY W/ MENISCAL REPAIR Right   . TOTAL KNEE ARTHROPLASTY Right 11/03/2015   Procedure: RIGHT TOTAL KNEE ARTHROPLASTY;  Surgeon: Dorna Leitz, MD;  Location: [facility];  Service: Orthopedics;  Laterality: Right;  . TUBAL LIGATION  1972  . [place]    Prior to Admission medications  Medication Sig Start Date End Date Taking? Authorizing Provider  aspirin EC 81 MG tablet Take 81 mg by mouth daily.   Yes [provider]  atorvastatin (LIPITOR) 10 MG tablet Take 10 mg by mouth daily.   Yes [provider]  Calcium Carb-Cholecalciferol (CALCIUM 600 + D PO) Take 2 tablets by mouth daily.   Yes [provider]  cycloSPORINE (RESTASIS) 0.05 % ophthalmic emulsion Place 1 drop into both eyes 2 (two) times daily.    Yes [provider]  Flaxseed, Linseed, (FLAX SEED OIL) 1000 MG CAPS Take 1,000 mg by mouth daily.   Yes [provider]  Ginger, Zingiber officinalis, (GINGER ROOT) 550 MG CAPS Take 550 mg by mouth daily.   Yes [provider]  Multiple Vitamins-Minerals (ONE DAILY WOMENS 50+ PO) Take 1 tablet by mouth daily.   Yes [provider]  pantoprazole (PROTONIX) 40 MG tablet Take 40-80 mg by mouth daily. 40 mg twice daily for one week, then 40 mg daily until finished 01/31/17  Yes [provider]  POTASSIUM PO Take 1 tablet by mouth daily.   Yes [provider]  TURMERIC PO Take 400 mg by mouth daily.   Yes [provider]  ciprofloxacin (CIPRO) 500 MG tablet Take 1 tablet (500 mg total) by mouth 2 (two) times daily. Patient not taking: Reported on 02/01/2017 01/21/17   Eulas Post, MD  solifenacin (VESICARE) 5 MG tablet Take 1 tablet (5 mg total) by mouth daily. Patient not taking: Reported on 02/01/2017 01/21/17   Eulas Post, MD    Current Facility-Administered Medications  Medication Dose Route Frequency Provider Last Rate Last Dose  . atorvastatin (LIPITOR) tablet 10 mg  10 mg Oral Daily Hosie Poisson, MD   10 mg at 02/01/17 2015  . cycloSPORINE (RESTASIS) 0.05 % ophthalmic emulsion 1 drop  1 drop Both Eyes BID Hosie Poisson, MD   1 drop at 02/01/17 2314  . iopamidol (ISOVUE-300) 61 % injection           . ondansetron (ZOFRAN) injection 4 mg  4 mg Intravenous Q6H PRN Hosie Poisson, MD      . pantoprazole (PROTONIX) injection 40 mg  40 mg Intravenous Q12H Hosie Poisson, MD   40 mg at 02/01/17 2016  . piperacillin-tazobactam (ZOSYN) IVPB 3.375 g  3.375 g Intravenous Q8H Green, Terri L, RPH        Allergies as of 02/01/2017 - Review Complete 02/01/2017  Allergen Reaction Noted  . Adhesive [tape] Hives 11/03/2015    Family History  Problem Relation Age of Onset  . Heart disease Mother 17  . Cancer Father 9       pancreatic    Social History   Socioeconomic History  . Marital status: Married    Spouse name: Not on file  . Number of children:  Not on file  . Years of education: Not on file  . Highest education level: Not on file  Social Needs  . Financial resource strain: Not on file  . Food insecurity - worry: Not on file  . Food insecurity - inability: Not on file  . Transportation needs - medical: Not on file  . Transportation needs - non-medical: Not on file  Occupational History  . Not on file  Tobacco Use  . Smoking status: Former Smoker    Packs/day: 1.00    Years: 20.00    Pack years: 20.00    Types: Cigarettes    Last  attempt to quit: 03/24/1990    Years since quitting: 26.8  . Smokeless tobacco: Former Network engineer and Sexual Activity  . Alcohol use: Yes    Alcohol/week: 3.6 oz    Types: 6 Glasses of wine per week    Comment: ocasional use   . Drug use: No  . Sexual activity: Not on file  Other Topics Concern  . Not on file  Social History Narrative  . Not on file    Review of Systems: ROS is O/W negative except as mentioned in HPI.  Physical Exam: Vital signs in last 24 hours: Temp:  [97.4 F (36.3 C)-98.4 F (36.9 C)] 98.4 F (36.9 C) (12/09 0452) Pulse Rate:  [67-83] 83 (12/09 0452) Resp:  [10-20] 20 (12/09 0452) BP: (133-165)/(79-97) 134/79 (12/09 0452) SpO2:  [97 %-100 %] 97 % (12/09 0452) Weight:  [154 lb 5.2 oz (70 kg)] 154 lb 5.2 oz (70 kg) (12/08 1858) Last BM Date: 02/01/17 General:  Alert, Well-developed, well-nourished, pleasant and cooperative in NAD Head:  Normocephalic and atraumatic. Eyes:  Sclera clear, no icterus.  Conjunctiva pink. Ears:  Normal auditory acuity. Mouth:  No deformity or lesions.   Lungs:  Clear throughout to auscultation.  No wheezes, crackles, or rhonchi.  Heart:  Regular rate and rhythm; no murmurs, clicks, rubs, or gallops. Abdomen:  Soft, non-distended.  BS present.  Mild diffuse TTP. Rectal:  Deferred  Msk:  Symmetrical without gross deformities. Pulses:  Normal pulses noted. Extremities:  Without clubbing or edema. Neurologic:  Alert and  oriented x 4;  grossly normal neurologically. Skin:  Intact without significant lesions or rashes. Psych:  Alert and cooperative. Normal mood and affect.  Intake/Output from previous day: 12/08 0701 - 12/09 0700 In: 240 [P.O.:240] Out: -   Lab Results: Recent Labs    02/01/17 1527  WBC 10.9*  HGB 12.2  HCT 36.0  PLT 277   BMET Recent Labs    02/01/17 1527  NA 139  K 4.2  CL 104  CO2 28  GLUCOSE 94  BUN 6  CREATININE 0.65  CALCIUM 8.7*   LFT Recent Labs    02/01/17 1527  PROT 6.2*  ALBUMIN 3.2*  AST 44*  ALT 30  ALKPHOS 71  BILITOT 0.4   PT/INR Recent Labs    02/01/17 1527  LABPROT 12.6  INR 0.95   Studies/Results: Ct Abdomen Pelvis W Contrast  Result Date: 02/01/2017 CLINICAL DATA:  71 year old female with history of squamous cell carcinoma of the skin presents with pain less rectal bleeding. EXAM: CT ABDOMEN AND PELVIS WITH CONTRAST TECHNIQUE: Multidetector CT imaging of the abdomen and pelvis was performed using the standard protocol following bolus administration of intravenous contrast. CONTRAST:  <See Chart> ISOVUE-300 IOPAMIDOL (ISOVUE-300) INJECTION 61% COMPARISON:  Chest CT dated 11/01/2015 FINDINGS: Lower chest: There is a 3 mm subpleural nodule at the left lung base (Series 2, image 12) similar to prior CT. The visualized lung bases are otherwise clear. There is no intra-abdominal free air or free fluid. Hepatobiliary: Ill-defined area of increased enhancement with apparent central scar in the left lobe of the liver measuring approximately 3 x 3 cm is not well characterized but likely represents a focal FNH versus a hemangioma. Other etiologies are not excluded. Further characterization with nonemergent MRI without and with contrast is recommended. The liver is otherwise unremarkable. No intrahepatic biliary ductal dilatation. The gallbladder is unremarkable. Pancreas: Unremarkable. No pancreatic ductal dilatation or surrounding inflammatory changes.  Spleen: Normal in size  without focal abnormality. Adrenals/Urinary Tract: The adrenal glands are unremarkable. The kidneys, visualized ureters, and urinary bladder appear unremarkable. Stomach/Bowel: There is a small hiatal hernia. There is mild segmental thickening of the splenic flexure and proximal descending colon which may be chronic or related to underdistention. Colitis is not excluded. Clinical correlation is recommended. Mild edema of the distal transverse colon may also be present. There is no bowel obstruction. The appendix is unremarkable. Vascular/Lymphatic: The aorta is tortuous. No aneurysmal dilatation or dissection. The origins of the celiac axis, SMA, IMA are patent. No portal venous gas. There is prominence of the junction of the IVC with right atrium similar to prior CT. There is no adenopathy. Reproductive: Hysterectomy.  No pelvic mass. Other: Bilateral inguinal surgical clips noted. Partially visualized bilateral breast implants. Musculoskeletal: Degenerative changes of the spine. No acute osseous pathology. IMPRESSION: 1. Under distention versus colitis involving the transverse and proximal descending colon. Clinical correlation is recommended. No bowel obstruction. Normal appendix. 2. **An incidental finding of potential clinical significance has been found. 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 Further characterization with MRI without and with contrast is recommended.** Electronically Signed   By: Anner Crete M.D.   On: 02/01/2017 23:18   IMPRESSION:  *71 year old female with severe abdominal cramping and bloody diarrhea.  CT suggests some colitis.  Story seems c/w possible ischemic colitis.  Symptoms improved significantly/resolving, although patient still interested in possible colonoscopy.  She is also interested in the plan that will get her out of the hospital the fastest, however. 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  PLAN: *Had a long discussion with the patient.  I do not think that colonoscopy is absolutely necessary and with the snow I do not know what will happen in regards to procedures tomorrow.  We have decided to place her on clear liquids and prep for possible colonoscopy.  If there is no further bleeding and colonoscopy not able to be performed on 12/10 then she could likely eat solid food and be discharged. *Otherwise monitor Hgb. *I think that antibiotics can likely be discontinued.   Laban Emperor. Zehr  02/02/2017, 9:12 AM  Pager number [phone]   ________________________________________________________________________  Velora Heckler GI MD note:  I reviewed the data and agree with the assessment and plan described above. I will be communicating the plan with Dr. Benson Norway this evening at signout.   Owens Loffler, MD [facility] Pager [phone]

## 2017-02-03 ENCOUNTER — Inpatient Hospital Stay (HOSPITAL_COMMUNITY): Payer: Medicare Other | Admitting: Anesthesiology

## 2017-02-03 ENCOUNTER — Encounter (HOSPITAL_COMMUNITY)
Admission: EM | Disposition: A | Discharge status: Home / Self Care | Payer: Self-pay | Source: Home / Self Care | Attending: Internal Medicine

## 2017-02-03 ENCOUNTER — Encounter (HOSPITAL_COMMUNITY): Payer: Self-pay | Admitting: Anesthesiology

## 2017-02-03 HISTORY — PX: COLONOSCOPY WITH PROPOFOL: SHX5780

## 2017-02-03 LAB — OCCULT BLOOD, POC DEVICE: FECAL OCCULT BLD: POSITIVE — AB

## 2017-02-03 LAB — HEMOGLOBIN AND HEMATOCRIT, BLOOD
HCT: 37.2 % (ref 36.0–46.0)
Hemoglobin: 12.6 g/dL (ref 12.0–15.0)

## 2017-02-03 SURGERY — COLONOSCOPY WITH PROPOFOL
Anesthesia: Monitor Anesthesia Care

## 2017-02-03 MED ORDER — PROPOFOL 10 MG/ML IV BOLUS
INTRAVENOUS | Status: AC
  Filled 2017-02-03: qty 20

## 2017-02-03 MED ORDER — LACTATED RINGERS IV SOLN
INTRAVENOUS | Status: DC | PRN
  Administered 2017-02-03: 14:00:00 via INTRAVENOUS

## 2017-02-03 MED ORDER — PROPOFOL 500 MG/50ML IV EMUL
INTRAVENOUS | Status: DC | PRN
  Administered 2017-02-03: 75 ug/kg/min via INTRAVENOUS

## 2017-02-03 MED ORDER — PROPOFOL 500 MG/50ML IV EMUL
INTRAVENOUS | Status: DC | PRN
  Administered 2017-02-03 (×2): 50 mg via INTRAVENOUS

## 2017-02-03 SURGICAL SUPPLY — 21 items

## 2017-02-03 NOTE — Transfer of Care (Signed)
Immediate Anesthesia Transfer of Care Note  Patient: Mia Peterson  Procedure(s) Performed: COLONOSCOPY WITH PROPOFOL (N/A )  Patient Location: PACU  Anesthesia Type:MAC  Level of Consciousness: awake, alert  and oriented  Airway & Oxygen Therapy: Patient Spontanous Breathing and Patient connected to face mask oxygen  Post-op Assessment: Report given to RN  Post vital signs: Reviewed and stable  Last Vitals:  Vitals:   02/03/17 0455 02/03/17 1354  BP: 106/75 (!) 168/78  Pulse: 65 67  Resp: 18 16  Temp: 36.8 C 36.7 C  SpO2: 96% 98%    Last Pain:  Vitals:   02/03/17 1354  TempSrc: Oral  PainSc:          Complications: No apparent anesthesia complications

## 2017-02-03 NOTE — Op Note (Signed)
[facility] Patient Name: Mia Peterson Procedure Date: 02/03/2017 MRN: [id number] Attending MD: Carol Ada , MD Date of Birth: [birth year] CSN: [id number] Age: 71 Admit Type: Inpatient Procedure:                Colonoscopy Indications:              Hematochezia Providers:                Carol Ada, MD, Cleda Daub, RN, William Dalton, Technician Referring MD:              Medicines:                Propofol per Anesthesia Complications:            No immediate complications. Estimated Blood Loss:     Estimated blood loss was minimal. Procedure:                Pre-Anesthesia Assessment:                           - Prior to the procedure, a History and Physical                            was performed, and patient medications and                            allergies were reviewed. The patient's tolerance of                            previous anesthesia was also reviewed. The risks                            and benefits of the procedure and the sedation                            options and risks were discussed with the patient.                            All questions were answered, and informed consent                            was obtained. Prior Anticoagulants: The patient has                            taken no previous anticoagulant or antiplatelet                            agents. ASA Grade Assessment: II - A patient with                            mild systemic disease. After reviewing the risks                            and benefits, the  patient was deemed in                            satisfactory condition to undergo the procedure.                           - Sedation was administered by an anesthesia                            professional. Deep sedation was attained.                           After obtaining informed consent, the colonoscope                            was passed under direct vision. Throughout the                             procedure, the patient's blood pressure, pulse, and                            oxygen saturations were monitored continuously. The                            EC-[id number] ([id number]) scope was introduced through                            the anus and advanced to the the terminal ileum.                            The colonoscopy was somewhat difficult due to                            significant looping. Successful completion of the                            procedure was aided by applying abdominal pressure.                            The patient tolerated the procedure well. The                            quality of the bowel preparation was excellent. The                            terminal ileum, ileocecal valve, appendiceal                            orifice, and rectum were photographed. Scope In: 2:14:21 PM Scope Out: 2:30:15 PM Scope Withdrawal Time: 0 hours 8 minutes 24 seconds  Total Procedure Duration: 0 hours 15 minutes 54 seconds  Findings:      A segmental area of mildly erythematous mucosa was found in the       descending colon and at the splenic flexure. Biopsies were taken  with a       cold forceps for histology. The gross appearance is consistent with an       ischemic colitis.      A single small-mouthed diverticulum was found in the sigmoid colon. Impression:               - Erythematous mucosa in the descending colon and                            at the splenic flexure. Biopsied.                           - Diverticulosis in the sigmoid colon. Moderate Sedation:      N/A- Per Anesthesia Care Recommendation:           - Return patient to hospital ward for ongoing care.                           - Resume regular diet.                           - Continue present medications.                           - Await pathology results.                           - Repeat colonoscopy is not recommended for                             surveillance. Procedure Code(s):        --- Professional ---                           [phone], Colonoscopy, flexible; with biopsy, single                            or multiple Diagnosis Code(s):        --- Professional ---                           K63.89, Other specified diseases of intestine                           K92.1, Melena (includes Hematochezia)                           K57.30, Diverticulosis of large intestine without                            perforation or abscess without bleeding CPT copyright 2016 American Medical Association. All rights reserved. The codes documented in this report are preliminary and upon coder review may  be revised to meet current compliance requirements. Carol Ada, MD Carol Ada, MD 02/03/2017 2:39:56 PM This report has been signed electronically. Number of Addenda: 0

## 2017-02-03 NOTE — Anesthesia Postprocedure Evaluation (Signed)
Anesthesia Post Note  Patient: Mia Peterson  Procedure(s) Performed: COLONOSCOPY WITH PROPOFOL (N/A )     Patient location during evaluation: PACU Anesthesia Type: MAC Level of consciousness: awake and alert Pain management: pain level controlled Vital Signs Assessment: post-procedure vital signs reviewed and stable Respiratory status: spontaneous breathing, nonlabored ventilation, respiratory function stable and patient connected to nasal cannula oxygen Cardiovascular status: stable and blood pressure returned to baseline Postop Assessment: no apparent nausea or vomiting Anesthetic complications: no    Last Vitals:  Vitals:   02/03/17 1445 02/03/17 1450  BP:    Pulse: 61 (!) 57  Resp: (!) 21 10  Temp:    SpO2: 100% 100%    Last Pain:  Vitals:   02/03/17 1354  TempSrc: Oral  PainSc:                  Yohance Hathorne

## 2017-02-03 NOTE — Anesthesia Preprocedure Evaluation (Signed)
Anesthesia Evaluation  Patient identified by MRN, date of birth, ID band Patient awake    Reviewed: Allergy & Precautions, NPO status , Patient's Chart, lab work & pertinent test results  History of Anesthesia Complications (+) PROLONGED EMERGENCENegative for: history of anesthetic complications  Airway Mallampati: II  TM Distance: >3 FB Neck ROM: Full    Dental no notable dental hx. (+) Teeth Intact   Pulmonary neg pulmonary ROS, asthma , former smoker,    Pulmonary exam normal breath sounds clear to auscultation       Cardiovascular negative cardio ROS Normal cardiovascular exam Rhythm:Regular     Neuro/Psych PSYCHIATRIC DISORDERS Depression TIAnegative neurological ROS     GI/Hepatic GERD  Medicated and Controlled,  Endo/Other    Renal/GU      Musculoskeletal  (+) Arthritis , Osteoarthritis,    Abdominal Normal abdominal exam  (+)   Peds  Hematology   Anesthesia Other Findings   Reproductive/Obstetrics                             Anesthesia Physical  Anesthesia Plan  ASA: II  Anesthesia Plan: MAC   Post-op Pain Management:    Induction: Intravenous  PONV Risk Score and Plan: 2 and Treatment may vary due to age or medical condition  Airway Management Planned: Natural Airway, Nasal Cannula and Simple Face Mask  Additional Equipment: None  Intra-op Plan: Utilization Of Total Body Hypothermia per surgeon request  Post-operative Plan:   Informed Consent: I have reviewed the patients History and Physical, chart, labs and discussed the procedure including the risks, benefits and alternatives for the proposed anesthesia with the patient or authorized representative who has indicated his/her understanding and acceptance.   Dental advisory given  Plan Discussed with: CRNA, Surgeon and Anesthesiologist  Anesthesia Plan Comments:         Anesthesia Quick Evaluation

## 2017-02-03 NOTE — Progress Notes (Signed)
Patient discharged home with husband, discharge instructions given and explained to patient/husband and they verbalized understanding, patient denies any pain/distress, no wound noted, skin intact. Accompanied home by husband. Mia Peterson

## 2017-02-03 NOTE — Interval H&P Note (Signed)
History and Physical Interval Note:  02/03/2017 2:02 PM  Mia Peterson  has presented today for surgery, with the diagnosis of Bloody diarrhea  The various methods of treatment have been discussed with the patient and family. After consideration of risks, benefits and other options for treatment, the patient has consented to  Procedure(s): COLONOSCOPY WITH PROPOFOL (N/A) as a surgical intervention .  The patient's history has been reviewed, patient examined, no change in status, stable for surgery.  I have reviewed the patient's chart and labs.  Questions were answered to the patient's satisfaction.     Pesach Frisch D

## 2017-02-04 DIAGNOSIS — I499 Cardiac arrhythmia, unspecified: Secondary | ICD-10-CM | POA: Diagnosis not present

## 2017-02-04 NOTE — Discharge Summary (Signed)
Physician Discharge Summary  Mia Peterson BTD:[id number] DOB: 1945/08/24 DOA: 02/01/2017  PCP: Mia Post, MD  Admit date: 02/01/2017 Discharge date: 02/04/2017  Admitted From: Home Disposition:  HOme.   Recommendations for Outpatient Follow-up:  1. Follow up with PCP in 1-2 weeks 2. Please obtain BMP/CBC in one week Please follow up with gastroenterology as recommended.   Discharge Condition:stable.  CODE STATUS: full code.  Diet recommendation: Heart Healthy  Brief/Interim Summary: Mia Peterson a 71 y.o.femalewith medical history significant ofasthma, GERD, squamous cell skin cancer, arthritis, comes in for rectal rectal bleeding, painless since one week.    Discharge Diagnoses:  Active Problems:   GERD   Squamous cell skin cancer   Rectal bleed   Rectal bleeding   Painless rectal bleeding: Probably secondary to ischemic colitis and diverticulosis.  CT abd and pelvis does show some colitis, Her abd cramping has improved.  No nausea or vomiting.  Appreciate GI consult and recommendations.  Colonoscopy showed ischemic colitis and diverticulosis.  .Bleeding resolved. And repeat H&H is stable.    GERD stable.     Discharge Instructions  Discharge Instructions    Diet - low sodium heart healthy   Complete by:  As directed    Discharge instructions   Complete by:  As directed    Please follow up with GI as recommended .  Please follow up with PCP in one week.     Allergies as of 02/03/2017      Reactions   Adhesive [tape] Hives   Use paper tape only      Medication List    STOP taking these medications   ciprofloxacin 500 MG tablet Commonly known as:  CIPRO     TAKE these medications   aspirin EC 81 MG tablet Take 81 mg by mouth daily.   atorvastatin 10 MG tablet Commonly known as:  LIPITOR Take 10 mg by mouth daily.   CALCIUM 600 + D PO Take 2 tablets by mouth daily.   cycloSPORINE 0.05 % ophthalmic emulsion Commonly  known as:  RESTASIS Place 1 drop into both eyes 2 (two) times daily.   Flax Seed Oil 1000 MG Caps Take 1,000 mg by mouth daily.   Ginger Root 550 MG Caps Take 550 mg by mouth daily.   ONE DAILY WOMENS 50+ PO Take 1 tablet by mouth daily.   pantoprazole 40 MG tablet Commonly known as:  PROTONIX Take 40-80 mg by mouth daily. 40 mg twice daily for one week, then 40 mg daily until finished   POTASSIUM PO Take 1 tablet by mouth daily.   solifenacin 5 MG tablet Commonly known as:  VESICARE Take 1 tablet (5 mg total) by mouth daily.   TURMERIC PO Take 400 mg by mouth daily.      Follow-up Information    Peterson, Mia Sierras, MD. Schedule an appointment as soon as possible for a visit in 1 week(s).   Specialty:  Family Medicine Contact information: [address] [place]  [address] [phone]          Allergies  Allergen Reactions  . Adhesive [Tape] Hives    Use paper tape only    Consultations:  Gastroenterology.    Procedures/Studies: Ct Abdomen Pelvis W Contrast  Result Date: 02/01/2017 CLINICAL DATA:  71 year old female with history of squamous cell carcinoma of the skin presents with pain less rectal bleeding. EXAM: CT ABDOMEN AND PELVIS WITH CONTRAST TECHNIQUE: Multidetector CT imaging of the abdomen and pelvis was  performed using the standard protocol following bolus administration of intravenous contrast. CONTRAST:  <See Chart> ISOVUE-300 IOPAMIDOL (ISOVUE-300) INJECTION 61% COMPARISON:  Chest CT dated 11/01/2015 FINDINGS: Lower chest: There is a 3 mm subpleural nodule at the left lung base (Series 2, image 12) similar to prior CT. The visualized lung bases are otherwise clear. There is no intra-abdominal free air or free fluid. Hepatobiliary: Ill-defined area of increased enhancement with apparent central scar in the left lobe of the liver measuring approximately 3 x 3 cm is not well characterized but likely represents a focal FNH versus a hemangioma.  Other etiologies are not excluded. Further characterization with nonemergent MRI without and with contrast is recommended. The liver is otherwise unremarkable. No intrahepatic biliary ductal dilatation. The gallbladder is unremarkable. Pancreas: Unremarkable. No pancreatic ductal dilatation or surrounding inflammatory changes. Spleen: Normal in size without focal abnormality. Adrenals/Urinary Tract: The adrenal glands are unremarkable. The kidneys, visualized ureters, and urinary bladder appear unremarkable. Stomach/Bowel: There is a small hiatal hernia. There is mild segmental thickening of the splenic flexure and proximal descending colon which may be chronic or related to underdistention. Colitis is not excluded. Clinical correlation is recommended. Mild edema of the distal transverse colon may also be present. There is no bowel obstruction. The appendix is unremarkable. Vascular/Lymphatic: The aorta is tortuous. No aneurysmal dilatation or dissection. The origins of the celiac axis, SMA, IMA are patent. No portal venous gas. There is prominence of the junction of the IVC with right atrium similar to prior CT. There is no adenopathy. Reproductive: Hysterectomy.  No pelvic mass. Other: Bilateral inguinal surgical clips noted. Partially visualized bilateral breast implants. Musculoskeletal: Degenerative changes of the spine. No acute osseous pathology. IMPRESSION: 1. Under distention versus colitis involving the transverse and proximal descending colon. Clinical correlation is recommended. No bowel obstruction. Normal appendix. 2. **An incidental finding of potential clinical significance has been found. 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 Further characterization with MRI without and with contrast is recommended.** Electronically Signed   By: Anner Crete M.D.   On: 02/01/2017 23:18  colonoscopy.    Subjective: No new complaints.   Discharge Exam: Vitals:    02/03/17 1450 02/03/17 1452  BP: 123/67 124/69  Pulse: (!) 57 63  Resp: 10 12  Temp:    SpO2: 100% 100%   Vitals:   02/03/17 1440 02/03/17 1445 02/03/17 1450 02/03/17 1452  BP: 137/72  123/67 124/69  Pulse: (!) 57 61 (!) 57 63  Resp: 17 (!) 21 10 12   Temp:      TempSrc:      SpO2: 100% 100% 100% 100%  Weight:      Height:        General: Pt is alert, awake, not in acute distress Cardiovascular: RRR, S1/S2 +, no rubs, no gallops Respiratory: CTA bilaterally, no wheezing, no rhonchi Abdominal: Soft, NT, ND, bowel sounds + Extremities: no edema, no cyanosis    The results of significant diagnostics from this hospitalization (including imaging, microbiology, ancillary and laboratory) are listed below for reference.     Microbiology: No results found for this or any previous visit (from the past 240 hour(s)).   Labs: BNP (last 3 results) No results for input(s): BNP in the last 8760 hours. Basic Metabolic Panel: Recent Labs  Lab 02/01/17 1527  NA 139  K 4.2  CL 104  CO2 28  GLUCOSE 94  BUN 6  CREATININE 0.65  CALCIUM 8.7*  Liver Function Tests: Recent Labs  Lab 02/01/17 1527  AST 44*  ALT 30  ALKPHOS 71  BILITOT 0.4  PROT 6.2*  ALBUMIN 3.2*   No results for input(s): LIPASE, AMYLASE in the last 168 hours. No results for input(s): AMMONIA in the last 168 hours. CBC: Recent Labs  Lab 02/01/17 1527 02/02/17 1536 02/03/17 0415  WBC 10.9*  --   --   NEUTROABS 8.1*  --   --   HGB 12.2 12.0 12.6  HCT 36.0 35.4* 37.2  MCV 88.2  --   --   PLT 277  --   --    Cardiac Enzymes: No results for input(s): CKTOTAL, CKMB, CKMBINDEX, TROPONINI in the last 168 hours. BNP: Invalid input(s): POCBNP CBG: No results for input(s): GLUCAP in the last 168 hours. D-Dimer No results for input(s): DDIMER in the last 72 hours. Hgb A1c No results for input(s): HGBA1C in the last 72 hours. Lipid Profile No results for input(s): CHOL, HDL, LDLCALC, TRIG, CHOLHDL,  LDLDIRECT in the last 72 hours. Thyroid function studies No results for input(s): TSH, T4TOTAL, T3FREE, THYROIDAB in the last 72 hours.  Invalid input(s): FREET3 Anemia work up No results for input(s): VITAMINB12, FOLATE, FERRITIN, TIBC, IRON, RETICCTPCT in the last 72 hours. Urinalysis    Component Value Date/Time   COLORURINE STRAW (A) 02/01/2017 1550   APPEARANCEUR CLEAR 02/01/2017 1550   LABSPEC 1.002 (L) 02/01/2017 1550   PHURINE 6.0 02/01/2017 1550   GLUCOSEU NEGATIVE 02/01/2017 1550   GLUCOSEU NEGATIVE 01/21/2017 1601   HGBUR SMALL (A) 02/01/2017 1550   HGBUR negative 10/23/2009 1102   BILIRUBINUR NEGATIVE 02/01/2017 1550   BILIRUBINUR neg 01/21/2017 1543   KETONESUR NEGATIVE 02/01/2017 1550   PROTEINUR NEGATIVE 02/01/2017 1550   UROBILINOGEN 0.2 01/21/2017 1601   NITRITE NEGATIVE 02/01/2017 1550   LEUKOCYTESUR TRACE (A) 02/01/2017 1550   Sepsis Labs Invalid input(s): PROCALCITONIN,  WBC,  LACTICIDVEN Microbiology No results found for this or any previous visit (from the past 240 hour(s)).   Time coordinating discharge: Over 30 minutes  SIGNED:   Hosie Poisson, MD  [facility] 02/04/2017, 10:56 PM Pager   If 7PM-7AM, please contact night-coverage [email or web address] Password [email or web address]

## 2017-02-05 ENCOUNTER — Encounter (HOSPITAL_COMMUNITY): Payer: Self-pay | Admitting: Gastroenterology

## 2017-02-05 ENCOUNTER — Telehealth: Payer: Self-pay | Admitting: Family Medicine

## 2017-02-05 DIAGNOSIS — Z1231 Encounter for screening mammogram for malignant neoplasm of breast: Secondary | ICD-10-CM | POA: Diagnosis not present

## 2017-02-05 DIAGNOSIS — I499 Cardiac arrhythmia, unspecified: Secondary | ICD-10-CM | POA: Diagnosis not present

## 2017-02-05 NOTE — Telephone Encounter (Signed)
I left a voice message for pt to return our call, TCM.

## 2017-02-06 NOTE — Telephone Encounter (Addendum)
Transition Care Management Follow-up Telephone Call  Admit date: 02/01/2017 Discharge date: 02/04/2017  Admitted From: Home Disposition:  HOme.   Recommendations for Outpatient Follow-up:  1. Follow up with PCP in 1-2 weeks 2. Please obtain BMP/CBC in one week Please follow up with gastroenterology as recommended.   Discharge Condition:stable --    How have you been since you were released from the hospital? "Good, just a little weak"   Do you understand why you were in the hospital? yes   Do you understand the discharge instructions? yes   Where were you discharged to? Home   Items Reviewed:  Medications reviewed: yes  Allergies reviewed: yes  Dietary changes reviewed: yes  Referrals reviewed: yes   Functional Questionnaire:   Activities of Daily Living (ADLs):   She states they are independent in the following: ambulation, bathing and hygiene, feeding, continence, grooming, toileting and dressing States they require assistance with the following: none   Any transportation issues/concerns?: no   Any patient concerns? no   Confirmed importance and date/time of follow-up visits scheduled yes  Provider Appointment booked with Dr. Elease Hashimoto on 02/07/2017 Friday   Confirmed with patient if condition begins to worsen call PCP or go to the ER.  Patient was given the office number and encouraged to call back with question or concerns.  : yes

## 2017-02-07 ENCOUNTER — Ambulatory Visit (INDEPENDENT_AMBULATORY_CARE_PROVIDER_SITE_OTHER): Payer: Medicare Other | Admitting: Family Medicine

## 2017-02-07 VITALS — BP 110/70 | HR 73 | Temp 98.2°F | Wt 153.0 lb

## 2017-02-07 DIAGNOSIS — R160 Hepatomegaly, not elsewhere classified: Secondary | ICD-10-CM | POA: Diagnosis not present

## 2017-02-07 DIAGNOSIS — K559 Vascular disorder of intestine, unspecified: Secondary | ICD-10-CM | POA: Diagnosis not present

## 2017-02-07 DIAGNOSIS — N6002 Solitary cyst of left breast: Secondary | ICD-10-CM | POA: Diagnosis not present

## 2017-02-07 DIAGNOSIS — N6320 Unspecified lump in the left breast, unspecified quadrant: Secondary | ICD-10-CM | POA: Diagnosis not present

## 2017-02-07 LAB — CBC WITH DIFFERENTIAL/PLATELET
BASOS PCT: 1.3 %
Basophils Absolute: 83 cells/uL (ref 0–200)
EOS ABS: 160 {cells}/uL (ref 15–500)
Eosinophils Relative: 2.5 %
HEMATOCRIT: 38.5 % (ref 35.0–45.0)
Hemoglobin: 13 g/dL (ref 11.7–15.5)
LYMPHS ABS: 1888 {cells}/uL (ref 850–3900)
MCH: 29.5 pg (ref 27.0–33.0)
MCHC: 33.8 g/dL (ref 32.0–36.0)
MCV: 87.5 fL (ref 80.0–100.0)
MPV: 10.2 fL (ref 7.5–12.5)
Monocytes Relative: 9.6 %
NEUTROS PCT: 57.1 %
Neutro Abs: 3654 cells/uL (ref 1500–7800)
PLATELETS: 370 10*3/uL (ref 140–400)
RBC: 4.4 10*6/uL (ref 3.80–5.10)
RDW: 12.5 % (ref 11.0–15.0)
TOTAL LYMPHOCYTE: 29.5 %
WBC: 6.4 10*3/uL (ref 3.8–10.8)
WBCMIX: 614 {cells}/uL (ref 200–950)

## 2017-02-07 LAB — HM MAMMOGRAPHY

## 2017-02-07 NOTE — Progress Notes (Signed)
Subjective:     [name], female   DOB: 26-Feb-1945, 71 y.o.   MRN: [id number]  HPI  Patient seen for hospital follow-up. She was up in [place] last week on December 6 when she developed some lower abdominal cramp-like pains and bright red blood per rectum. She went to hospital there and was admitted overnight for one day. Her CBC remained stable. They were concerned that she may be having upper GI process and they increased her Protonix to 40 mg twice daily. She was discharged home and then after returning here was admitted on December 8 with continued rectal bleeding and diffuse lower abdominal cramping.  Her CBC remained stable. CT scan of abdomen and pelvis showed changes consistent with probable ischemic colitis. She had colonoscopy and biopsy confirmed ischemic colitis. No polyps or other abnormalities.  She denies any prior history of lower GI bleeding. She had previous colonoscopy about 3 years prior which was unremarkable. Her CT scan did show incidental finding of lesion in her liver (left lobe) with recommended further characterization. Possible hemangioma. Recommendation per urology was for MRI with and without contrast to further assess  Appetite is improved. No fevers or chills. She's had no further rectal bleeding. Discharge hemoglobin 12.6.  Past Medical History:  Diagnosis Date  . Arthritis    right knee and foot- [facility]   . Asthma    treated in 20's and 30's  . Cancer ([place])    skin- squamous basal  . Complication of anesthesia    very slow to awaken   . GERD (gastroesophageal reflux disease)    prn Prilosec  . Head injury, closed    10/26/15- years ago - 30ish  . Shortness of breath dyspnea    with exertion  . Vaso vagal episode    passed out 2 times, close to passing out 3 times  . Vertigo    was treated with rehab March and April 2017, does exercises if she begins to have symptoms   Past Surgical History:  Procedure Laterality  Date  . ABDOMINAL HYSTERECTOMY  1993   menorrhagia.  [place] Kitchen BREAST ENHANCEMENT SURGERY  1978  . COLONOSCOPY W/ POLYPECTOMY    . COLONOSCOPY WITH PROPOFOL N/A 02/03/2017   Procedure: COLONOSCOPY WITH PROPOFOL;  Surgeon: Carol Ada, MD;  Location: [facility];  Service: Endoscopy;  Laterality: N/A;  . KNEE ARTHROSCOPY W/ MENISCAL REPAIR Right   . 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 TUBAL LIGATION  1972  . [place]    reports that she quit smoking about 26 years ago. Her smoking use included cigarettes. She has a 20.00 pack-year smoking history. She has quit using smokeless tobacco. She reports that she drinks about 3.6 oz of alcohol per week. She reports that she does not use drugs. family history includes Cancer (age of onset: 73) in her father; Heart disease (age of onset: 50) in her mother. Allergies  Allergen Reactions  . Adhesive [Tape] Hives    Use paper tape only    Review of Systems  Constitutional: Negative for appetite change, chills and fever.  Respiratory: Negative for shortness of breath.   Cardiovascular: Negative for chest pain.  Gastrointestinal: Negative for abdominal pain, constipation, nausea and vomiting.  Neurological: Negative for dizziness.       Objective:   Physical Exam  Constitutional:  She appears well-developed and well-nourished.  HENT:  Mouth/Throat: Oropharynx is clear and moist.  Neck: Neck supple.  Cardiovascular: Normal rate and regular rhythm.  Pulmonary/Chest: Effort normal and breath sounds normal. No respiratory distress. She has no wheezes. She has no rales.  Abdominal: Soft. Bowel sounds are normal. She exhibits no distension and no mass. There is no tenderness. There is no rebound and no guarding.       Assessment:     #1 recent lower GI bleed related to ischemic colitis.  Clinically improving. Hemoglobin never dropped significantly.  #2 ill-defined incidental lesion of the liver noted on CT abdomen and pelvis    Plan:     -Follow-up promptly for any further rectal bleeding or recurrent abdominal pain -repeat CBC today. -Set up MRI abdomen with and without contrast to further assess abnormality of liver noted on CT scan  Eulas Post MD [facility] at [facility]

## 2017-02-07 NOTE — Patient Instructions (Signed)
Ischemic Colitis Ischemic colitis is damage to the large intestine due to reduced blood flow (ischemia) to the colon. The colon is the last section of the large intestine, where stool is formed. The reduced blood flow may lead to the death of cells (necrosis) in the lining of the colon, damaging the colon and often causing bleeding. Most cases of ischemic colitis clear up in a few days with treatment. In other cases, blood flow does not improve, and parts of the colon start to die. This is extremely serious and even life-threatening. If this happens, surgery may be required. In some cases, parts of the colon may need to be removed. What are the causes? Ischemic colitis results from a decrease in the blood supply to the colon. Many conditions can cause this, such as:  Heart problems that reduce blood flow to the arteries that supply the colon. These include problems such as coronary heart disease, peripheral vascular disease, atrial fibrillation, and congestive heart failure.  Low blood pressure from: ? An infection that spreads to the blood (sepsis). ? Dehydration or bleeding (shock).  Drugs that narrow blood vessels (vasoconstrictors).  Sometimes the cause is not known. What increases the risk? You are more likely to develop this condition if:  You are 36 years of age or older.  You are female.  You have another medical condition, such as: ? Heart disease. ? Diabetes. ? Kidney disease that requires you to be on dialysis. ? A disease that causes blood clots.  You are frequently constipated.  You have had surgery on the heart, blood vessels (such as the aorta), or colon.  You take certain medicines or drugs, such as: ? Medicines that suppress your immune system (immunomodulators). ? Medicines that cause constipation. ? Illegal drugs, such as cocaine or methamphetamines.  You get an extreme amount of exercise from long-distance bike riding or running.  What are the signs or  symptoms? Symptoms of this condition start suddenly and may include:  Dull pain, usually on the left side of the abdomen.  Tenderness of the abdomen.  Abdomen (abdominal) cramps.  An urgent need to have a bowel movement.  Loose, bloody stools with clots of dark or bright red blood.  Nausea and vomiting.  Fever.  Weakness, fatigue, and confusion.  How is this diagnosed? This condition may be diagnosed based on:  Your symptoms, your medical history, and a physical exam.  Tests to find out more about your condition and to rule out other causes of pain and bleeding. These tests may include: ? Blood tests to check for clotting, blood loss, and low proteins in your blood. ? CT scan of the colon. ? A procedure to examine the inside of your colon using a scope that is passed through the rectum (colonoscopy). Colonoscopy is the most important diagnostic test. During this test, your health care provider may take a small piece of tissue from your colon to be examined under a microscope (biopsy).  How is this treated? You may be hospitalized for treatment. Treatment usually includes:  Not eating or drinking anything. This allows the colon to rest.  IV fluids to maintain blood pressure, regulate blood minerals (electrolytes), and provide nutrition.  Having a tube inserted into your stomach through your nose (nasogastric tube) to drain your stomach.  IV antibiotic medicines. These may be used if an infection is suspected.  Stopping or changing medicines that may be causing the condition.  You may need surgery if your condition is severe or  if it gets worse or does not get better after a few days. Parts of the colon that will not recover may need to be removed. In some cases, a procedure is also done to attach the healthy part of the colon to the outer wall of the abdomen to drain stool (colostomy). Follow these instructions at home:  Follow instructions from your health care provider  about eating or drinking restrictions.  Drink enough fluid to keep your urine clear or pale yellow.  Take over-the-counter and prescription medicines only as told by your health care provider.  Return to your normal activities as told by your health care provider. Ask your health care provider what activities are safe for you.  Do not use any products that contain nicotine or tobacco, such as cigarettes and e-cigarettes. If you need help quitting, ask your health care provider.  Keep all follow-up visits as told by your health care provider. This is important. Contact a health care provider if:  You have blood in your stool.  You have abdominal pain or cramps.  You have constipation.  You have nausea or vomiting. Get help right away if:  You have a moderate to large amount of loose, bloody stools with clots of dark or bright red blood.  You have severe abdominal pain.  Your abdominal pain has not improved after 24 hours.  You have a fever.  You have not been able to have a bowel movement, and you are in pain and vomiting.  You have shortness of breath.  You are very tired (lethargic) or have confusion. Summary  Ischemic colitis is damage to the large intestine due to reduced blood flow (ischemia) to the colon.  Some of the symptoms of this condition include abdominal pain or tenderness, bloody stools, and an urgent need to have a bowel movement.  Diagnosis usually includes a procedure to examine the inside of the colon using a scope that is passed through the rectum (colonoscopy). This information is not intended to replace advice given to you by your health care provider. Make sure you discuss any questions you have with your health care provider. Document Released: 04/01/2016 Document Revised: 04/01/2016 Document Reviewed: 04/01/2016 Elsevier Interactive Patient Education  [employer].  We will be setting up MRI of abdomen to further assess.

## 2017-02-09 ENCOUNTER — Encounter: Payer: Self-pay | Admitting: Family Medicine

## 2017-02-20 DIAGNOSIS — K559 Vascular disorder of intestine, unspecified: Secondary | ICD-10-CM | POA: Diagnosis not present

## 2017-02-20 DIAGNOSIS — K5730 Diverticulosis of large intestine without perforation or abscess without bleeding: Secondary | ICD-10-CM | POA: Diagnosis not present

## 2017-02-21 ENCOUNTER — Ambulatory Visit
Admission: RE | Admit: 2017-02-21 | Discharge: 2017-02-21 | Disposition: A | Discharge status: Home / Self Care | Payer: Medicare Other | Source: Ambulatory Visit | Attending: Family Medicine | Admitting: Family Medicine

## 2017-02-21 DIAGNOSIS — K7689 Other specified diseases of liver: Secondary | ICD-10-CM | POA: Diagnosis not present

## 2017-02-21 DIAGNOSIS — R160 Hepatomegaly, not elsewhere classified: Secondary | ICD-10-CM

## 2017-02-21 MED ORDER — GADOBENATE DIMEGLUMINE 529 MG/ML IV SOLN
15.0000 mL | Freq: Once | INTRAVENOUS | Status: AC | PRN
  Administered 2017-02-21: 15 mL via INTRAVENOUS

## 2017-03-06 DIAGNOSIS — I87323 Chronic venous hypertension (idiopathic) with inflammation of bilateral lower extremity: Secondary | ICD-10-CM | POA: Diagnosis not present

## 2017-03-10 ENCOUNTER — Encounter: Payer: Self-pay | Admitting: Family Medicine

## 2017-03-10 DIAGNOSIS — Z8673 Personal history of transient ischemic attack (TIA), and cerebral infarction without residual deficits: Secondary | ICD-10-CM | POA: Insufficient documentation

## 2017-05-27 ENCOUNTER — Ambulatory Visit (INDEPENDENT_AMBULATORY_CARE_PROVIDER_SITE_OTHER): Payer: Medicare Other

## 2017-05-27 ENCOUNTER — Telehealth: Payer: Self-pay

## 2017-05-27 VITALS — BP 106/66 | HR 74 | Ht 68.0 in | Wt 159.0 lb

## 2017-05-27 DIAGNOSIS — Z1159 Encounter for screening for other viral diseases: Secondary | ICD-10-CM

## 2017-05-27 DIAGNOSIS — Z0000 Encounter for general adult medical examination without abnormal findings: Secondary | ICD-10-CM | POA: Diagnosis not present

## 2017-05-27 MED ORDER — SOLIFENACIN SUCCINATE 5 MG PO TABS: 5.0000 mg | ORAL_TABLET | Freq: Every day | ORAL | 11 refills | Status: DC

## 2017-05-27 NOTE — Patient Instructions (Addendum)
Mia Peterson , Thank you for taking time to come for your Medicare Wellness Visit. I appreciate your ongoing commitment to your health goals. Please review the following plan we discussed and let me know if I can assist you in the future.   Basket note sent to Dr. Elease Hashimoto for consideration of filling Vesicare.   Will try Ensure for weight loss after exercise;  Let Charlissa know if this was helpful via my chart  It comes in various flavors  Medicare now request all "baby boomers" test for possible exposure to Hepatitis C. Many may have been exposed due to dental work, tatoo's, vaccinations when young. The Hepatitis C virus is dormant for many years and then sometimes will cause liver cancer. If you gave blood in the past 15 years, you were most likely checked for Hep C. If you rec'd blood; you may want to consider testing or if you are high risk for any other reason.    A Tetanus is recommended every 10 years. Medicare covers a tetanus if you have a cut or wound; otherwise, there may be a charge. If you had not had a tetanus with pertusses, known as the Tdap, you can take this anytime.    Shingrix is a vaccine for the prevention of Shingles in Adults 50 and older.  If you are on Medicare, you can request a prescription from your doctor to be filled at a pharmacy.  Please check with your benefits regarding applicable copays or out of pocket expenses.  The Shingrix is given in 2 vaccines approx 8 weeks apart. You must receive the 2nd dose prior to 6 months from receipt of the first.   Brain stimulation which is with your bridge   These are the goals we discussed: Goals    . Weight (lb) < 150 lb (68 kg)     Give up wine (one glass)        This is a list of the screening recommended for you and due dates:  Health Maintenance  Topic Date Due  .  Hepatitis C: One time screening is recommended by Center for Disease Control  (CDC) for  adults born from [birth year] through 1965.   1945/12/07  .  Tetanus Vaccine  02/25/2014  . Flu Shot  09/25/2017  . Mammogram  02/08/2019  . Colon Cancer Screening  02/03/2022  . DEXA scan (bone density measurement)  Completed  . Pneumonia vaccines  Completed      Fall Prevention in the Home Falls can cause injuries. They can happen to people of all ages. There are many things you can do to make your home safe and to help prevent falls. What can I do on the outside of my home?  Regularly fix the edges of walkways and driveways and fix any cracks.  Remove anything that might make you trip as you walk through a door, such as a raised step or threshold.  Trim any bushes or trees on the path to your home.  Use bright outdoor lighting.  Clear any walking paths of anything that might make someone trip, such as rocks or tools.  Regularly check to see if handrails are loose or broken. Make sure that both sides of any steps have handrails.  Any raised decks and porches should have guardrails on the edges.  Have any leaves, snow, or ice cleared regularly.  Use sand or salt on walking paths during winter.  Clean up any spills in your garage right away. This includes  oil or grease spills. What can I do in the bathroom?  Use night lights.  Install grab bars by the toilet and in the tub and shower. Do not use towel bars as grab bars.  Use non-skid mats or decals in the tub or shower.  If you need to sit down in the shower, use a plastic, non-slip stool.  Keep the floor dry. Clean up any water that spills on the floor as soon as it happens.  Remove soap buildup in the tub or shower regularly.  Attach bath mats securely with double-sided non-slip rug tape.  Do not have throw rugs and other things on the floor that can make you trip. What can I do in the bedroom?  Use night lights.  Make sure that you have a light by your bed that is easy to reach.  Do not use any sheets or blankets that are too big for your bed. They should not hang  down onto the floor.  Have a firm chair that has side arms. You can use this for support while you get dressed.  Do not have throw rugs and other things on the floor that can make you trip. What can I do in the kitchen?  Clean up any spills right away.  Avoid walking on wet floors.  Keep items that you use a lot in easy-to-reach places.  If you need to reach something above you, use a strong step stool that has a grab bar.  Keep electrical cords out of the way.  Do not use floor polish or wax that makes floors slippery. If you must use wax, use non-skid floor wax.  Do not have throw rugs and other things on the floor that can make you trip. What can I do with my stairs?  Do not leave any items on the stairs.  Make sure that there are handrails on both sides of the stairs and use them. Fix handrails that are broken or loose. Make sure that handrails are as long as the stairways.  Check any carpeting to make sure that it is firmly attached to the stairs. Fix any carpet that is loose or worn.  Avoid having throw rugs at the top or bottom of the stairs. If you do have throw rugs, attach them to the floor with carpet tape.  Make sure that you have a light switch at the top of the stairs and the bottom of the stairs. If you do not have them, ask someone to add them for you. What else can I do to help prevent falls?  Wear shoes that: ? Do not have high heels. ? Have rubber bottoms. ? Are comfortable and fit you well. ? Are closed at the toe. Do not wear sandals.  If you use a stepladder: ? Make sure that it is fully opened. Do not climb a closed stepladder. ? Make sure that both sides of the stepladder are locked into place. ? Ask someone to hold it for you, if possible.  Clearly mark and make sure that you can see: ? Any grab bars or handrails. ? First and last steps. ? Where the edge of each step is.  Use tools that help you move around (mobility aids) if they are needed.  These include: ? Canes. ? Walkers. ? Scooters. ? Crutches.  Turn on the lights when you go into a dark area. Replace any light bulbs as soon as they burn out.  Set up your furniture so you  have a clear path. Avoid moving your furniture around.  If any of your floors are uneven, fix them.  If there are any pets around you, be aware of where they are.  Review your medicines with your doctor. Some medicines can make you feel dizzy. This can increase your chance of falling. Ask your doctor what other things that you can do to help prevent falls. This information is not intended to replace advice given to you by your health care provider. Make sure you discuss any questions you have with your health care provider. Document Released: 12/08/2008 Document Revised: 07/20/2015 Document Reviewed: 03/18/2014 Elsevier Interactive Patient Education  2018 [place] Maintenance, Female Adopting a healthy lifestyle and getting preventive care can go a long way to promote health and wellness. Talk with your health care provider about what schedule of regular examinations is right for you. This is a good chance for you to check in with your provider about disease prevention and staying healthy. In between checkups, there are plenty of things you can do on your own. Experts have done a lot of research about which lifestyle changes and preventive measures are most likely to keep you healthy. Ask your health care provider for more information. Weight and diet Eat a healthy diet  Be sure to include plenty of vegetables, fruits, low-fat dairy products, and lean protein.  Do not eat a lot of foods high in solid fats, added sugars, or salt.  Get regular exercise. This is one of the most important things you can do for your health. ? Most adults should exercise for at least 150 minutes each week. The exercise should increase your heart rate and make you sweat (moderate-intensity exercise). ? Most  adults should also do strengthening exercises at least twice a week. This is in addition to the moderate-intensity exercise.  Maintain a healthy weight  Body mass index (BMI) is a measurement that can be used to identify possible weight problems. It estimates body fat based on height and weight. Your health care provider can help determine your BMI and help you achieve or maintain a healthy weight.  For females 65 years of age and older: ? A BMI below 18.5 is considered underweight. ? A BMI of 18.5 to 24.9 is normal. ? A BMI of 25 to 29.9 is considered overweight. ? A BMI of 30 and above is considered obese.  Watch levels of cholesterol and blood lipids  You should start having your blood tested for lipids and cholesterol at 72 years of age, then have this test every 5 years.  You may need to have your cholesterol levels checked more often if: ? Your lipid or cholesterol levels are high. ? You are older than 72 years of age. ? You are at high risk for heart disease.  Cancer screening Lung Cancer  Lung cancer screening is recommended for adults 57-76 years old who are at high risk for lung cancer because of a history of smoking.  A yearly low-dose CT scan of the lungs is recommended for people who: ? Currently smoke. ? Have quit within the past 15 years. ? Have at least a 30-pack-year history of smoking. A pack year is smoking an average of one pack of cigarettes a day for 1 year.  Yearly screening should continue until it has been 15 years since you quit.  Yearly screening should stop if you develop a health problem that would prevent you from having lung cancer treatment.  Breast  Cancer  Practice breast self-awareness. This means understanding how your breasts normally appear and feel.  It also means doing regular breast self-exams. Let your health care provider know about any changes, no matter how small.  If you are in your 20s or 30s, you should have a clinical breast  exam (CBE) by a health care provider every 1-3 years as part of a regular health exam.  If you are 34 or older, have a CBE every year. Also consider having a breast X-ray (mammogram) every year.  If you have a family history of breast cancer, talk to your health care provider about genetic screening.  If you are at high risk for breast cancer, talk to your health care provider about having an MRI and a mammogram every year.  Breast cancer gene (BRCA) assessment is recommended for women who have family members with BRCA-related cancers. BRCA-related cancers include: ? Breast. ? Ovarian. ? Tubal. ? Peritoneal cancers.  Results of the assessment will determine the need for genetic counseling and BRCA1 and BRCA2 testing.  Cervical Cancer Your health care provider may recommend that you be screened regularly for cancer of the pelvic organs (ovaries, uterus, and vagina). This screening involves a pelvic examination, including checking for microscopic changes to the surface of your cervix (Pap test). You may be encouraged to have this screening done every 3 years, beginning at age 33.  For women ages 36-65, health care providers may recommend pelvic exams and Pap testing every 3 years, or they may recommend the Pap and pelvic exam, combined with testing for human papilloma virus (HPV), every 5 years. Some types of HPV increase your risk of cervical cancer. Testing for HPV may also be done on women of any age with unclear Pap test results.  Other health care providers may not recommend any screening for nonpregnant women who are considered low risk for pelvic cancer and who do not have symptoms. Ask your health care provider if a screening pelvic exam is right for you.  If you have had past treatment for cervical cancer or a condition that could lead to cancer, you need Pap tests and screening for cancer for at least 20 years after your treatment. If Pap tests have been discontinued, your risk factors  (such as having a new sexual partner) need to be reassessed to determine if screening should resume. Some women have medical problems that increase the chance of getting cervical cancer. In these cases, your health care provider may recommend more frequent screening and Pap tests.  Colorectal Cancer  This type of cancer can be detected and often prevented.  Routine colorectal cancer screening usually begins at 72 years of age and continues through 72 years of age.  Your health care provider may recommend screening at an earlier age if you have risk factors for colon cancer.  Your health care provider may also recommend using home test kits to check for hidden blood in the stool.  A small camera at the end of a tube can be used to examine your colon directly (sigmoidoscopy or colonoscopy). This is done to check for the earliest forms of colorectal cancer.  Routine screening usually begins at age 40.  Direct examination of the colon should be repeated every 5-10 years through 72 years of age. However, you may need to be screened more often if early forms of precancerous polyps or small growths are found.  Skin Cancer  Check your skin from head to toe regularly.  Tell your  health care provider about any new moles or changes in moles, especially if there is a change in a mole's shape or color.  Also tell your health care provider if you have a mole that is larger than the size of a pencil eraser.  Always use sunscreen. Apply sunscreen liberally and repeatedly throughout the day.  Protect yourself by wearing long sleeves, pants, a wide-brimmed hat, and sunglasses whenever you are outside.  Heart disease, diabetes, and high blood pressure  High blood pressure causes heart disease and increases the risk of stroke. High blood pressure is more likely to develop in: ? People who have blood pressure in the high end of the normal range (130-139/85-89 mm Hg). ? People who are overweight or  obese. ? People who are African American.  If you are 41-24 years of age, have your blood pressure checked every 3-5 years. If you are 74 years of age or older, have your blood pressure checked every year. You should have your blood pressure measured twice-once when you are at a hospital or clinic, and once when you are not at a hospital or clinic. Record the average of the two measurements. To check your blood pressure when you are not at a hospital or clinic, you can use: ? An automated blood pressure machine at a pharmacy. ? A home blood pressure monitor.  If you are between 28 years and 69 years old, ask your health care provider if you should take aspirin to prevent strokes.  Have regular diabetes screenings. This involves taking a blood sample to check your fasting blood sugar level. ? If you are at a normal weight and have a low risk for diabetes, have this test once every three years after 72 years of age. ? If you are overweight and have a high risk for diabetes, consider being tested at a younger age or more often. Preventing infection Hepatitis B  If you have a higher risk for hepatitis B, you should be screened for this virus. You are considered at high risk for hepatitis B if: ? You were born in a country where hepatitis B is common. Ask your health care provider which countries are considered high risk. ? Your parents were born in a high-risk country, and you have not been immunized against hepatitis B (hepatitis B vaccine). ? You have HIV or AIDS. ? You use needles to inject street drugs. ? You live with someone who has hepatitis B. ? You have had sex with someone who has hepatitis B. ? You get hemodialysis treatment. ? You take certain medicines for conditions, including cancer, organ transplantation, and autoimmune conditions.  Hepatitis C  Blood testing is recommended for: ? Everyone born from [birth year] through 1965. ? Anyone with known risk factors for hepatitis  C.  Sexually transmitted infections (STIs)  You should be screened for sexually transmitted infections (STIs) including gonorrhea and chlamydia if: ? You are sexually active and are younger than 72 years of age. ? You are older than 72 years of age and your health care provider tells you that you are at risk for this type of infection. ? Your sexual activity has changed since you were last screened and you are at an increased risk for chlamydia or gonorrhea. Ask your health care provider if you are at risk.  If you do not have HIV, but are at risk, it may be recommended that you take a prescription medicine daily to prevent HIV infection. This is called pre-exposure prophylaxis (  PrEP). You are considered at risk if: ? You are sexually active and do not regularly use condoms or know the HIV status of your partner(s). ? You take drugs by injection. ? You are sexually active with a partner who has HIV.  Talk with your health care provider about whether you are at high risk of being infected with HIV. If you choose to begin PrEP, you should first be tested for HIV. You should then be tested every 3 months for as long as you are taking PrEP. Pregnancy  If you are premenopausal and you may become pregnant, ask your health care provider about preconception counseling.  If you may become pregnant, take 400 to 800 micrograms (mcg) of folic acid every day.  If you want to prevent pregnancy, talk to your health care provider about birth control (contraception). Osteoporosis and menopause  Osteoporosis is a disease in which the bones lose minerals and strength with aging. This can result in serious bone fractures. Your risk for osteoporosis can be identified using a bone density scan.  If you are 24 years of age or older, or if you are at risk for osteoporosis and fractures, ask your health care provider if you should be screened.  Ask your health care provider whether you should take a calcium or  vitamin D supplement to lower your risk for osteoporosis.  Menopause may have certain physical symptoms and risks.  Hormone replacement therapy may reduce some of these symptoms and risks. Talk to your health care provider about whether hormone replacement therapy is right for you. Follow these instructions at home:  Schedule regular health, dental, and eye exams.  Stay current with your immunizations.  Do not use any tobacco products including cigarettes, chewing tobacco, or electronic cigarettes.  If you are pregnant, do not drink alcohol.  If you are breastfeeding, limit how much and how often you drink alcohol.  Limit alcohol intake to no more than 1 drink per day for nonpregnant women. One drink equals 12 ounces of beer, 5 ounces of wine, or 1 ounces of hard liquor.  Do not use street drugs.  Do not share needles.  Ask your health care provider for help if you need support or information about quitting drugs.  Tell your health care provider if you often feel depressed.  Tell your health care provider if you have ever been abused or do not feel safe at home. This information is not intended to replace advice given to you by your health care provider. Make sure you discuss any questions you have with your health care provider. Document Released: 08/27/2010 Document Revised: 07/20/2015 Document Reviewed: 11/15/2014 Elsevier Interactive Patient Education  [employer].

## 2017-05-27 NOTE — Telephone Encounter (Signed)
Mia Peterson in for AWV Stated she would like to try vesicare for urinary frequency. States she was treated with Mybetriq and did not find this helpful. Stated you had discussed Vesicare with her on 12/2016 Thought you had ordered a rx  Note 12/2016 states "Consider trial of Vesicare 5 mg once daily after her UTI symptoms have improved"  Please advise if if rx can be ordered. Denies symptoms of UTI>  Thanks.

## 2017-05-27 NOTE — Progress Notes (Addendum)
Subjective:   Mia Peterson is a 72 y.o. female who presents for Medicare Annual/Subsequent preventive examination.  Last seen Dr. Elease Hashimoto 12/18  Episode of IC now resolved  Had a TIA - recent; not sure this was TIA   BMI 24.2    Describes Health as poor, fair, good or great? 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   ETOH; son is a Pharmacist, community;  Has younger son died x 2 yo  Spending time with dtr in Sports coach and grand children  6 grand children; 2 -57  And 59;  The others range from 12 to 17 doing well     Diet  Eat 3 meals a day Eat a lot of vegetables; lots of greens, kales Does not drink milk Eat yogurt; only eat whole wheat  Diet is excellent;  Very rarely eat sweets/ out one time a week  Eats a lot of salad with oil and vinegar Broccoli and carrots,  Roast vegetables Does not eat a lot of bread   Exercise:  Exercise x 6 days a week Knee replacement is much improved Does Zumba a couple of days a week  Does a lot of recumbent back (taught dance aerobics)  Deep water aerobics continues this  Strength and flexibility class- yoga  Weight x 2 per week 60 x 6; sometimes she does 2 classes   For enjoyment, is learning to play bridge    Labs lipids very good; hdl 79; trig 37   Health Maintenance Due  Topic Date Due  . Hepatitis C Screening  16-Aug-1945  . TETANUS/TDAP  02/25/2014   Colonoscopy 01/2017 - repeat in 5 years  Mammogram 01/2017 Bone density 03/2015  -1.8  Educated regarding shingrix   Cardiac Risk Factors include: advanced age (>72men, >43 women);family history of premature cardiovascular disease;dyslipidemia     Objective:    Vitals: BP 106/66   Pulse 74   Ht 5\' 8"  (1.727 m)   Wt 159 lb (72.1 kg)   SpO2 97%   BMI 24.18 kg/m   Body mass index is 24.18 kg/m.  Advanced Directives 05/27/2017 02/01/2017  04/03/2016 11/03/2015 10/26/2015 05/09/2015  Does Patient Have a Medical Advance Directive? Yes Yes Yes Yes Yes Yes  Type of Advance Directive - [place];Living will - [place];Living will [place];Living will [place];Living will  Does patient want to make changes to medical advance directive? - No - Patient declined - No - Patient declined - No - Patient declined  Copy of [place] in Chart? - No - copy requested - No - copy requested No - copy requested -    Tobacco Social History   Tobacco Use  Smoking Status Former Smoker  . Packs/day: 1.00  . Years: 20.00  . Pack years: 20.00  . Types: Cigarettes  . Last attempt to quit: 03/24/1990  . Years since quitting: 27.1  Smokeless Tobacco Former Engineer, structural given: Not Answered   Clinical Intake:     Past Medical History:  Diagnosis Date  . Arthritis    right knee and foot- [facility]   . Asthma    treated in 20's and 30's  . 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 Complication of anesthesia    very slow to  awaken   . GERD (gastroesophageal reflux disease)    prn Prilosec  . Head injury, closed    10/26/15- years ago - 30ish  . Shortness of breath dyspnea    with exertion  . Vaso vagal episode    passed out 2 times, close to passing out 3 times  . Vertigo    was treated with rehab March and April 2017, does exercises if she begins to have symptoms   Past Surgical History:  Procedure Laterality Date  . ABDOMINAL HYSTERECTOMY  1993   menorrhagia.  Marland Kitchen BREAST ENHANCEMENT SURGERY  1978  . COLONOSCOPY W/ POLYPECTOMY    . COLONOSCOPY WITH PROPOFOL N/A 02/03/2017   Procedure: COLONOSCOPY WITH PROPOFOL;  Surgeon: Carol Ada, MD;  Location: [facility];  Service: Endoscopy;  Laterality: N/A;  . KNEE ARTHROSCOPY W/ MENISCAL REPAIR Right   . TOTAL KNEE ARTHROPLASTY Right 11/03/2015   Procedure: RIGHT TOTAL KNEE ARTHROPLASTY;   Surgeon: Dorna Leitz, MD;  Location: [facility];  Service: Orthopedics;  Laterality: Right;  . TUBAL LIGATION  1972  . VARICOSE VEIN SURGERY  1978   Family History  Problem Relation Age of Onset  . Heart disease Mother 61  . Cancer Father 19       pancreatic   Social History   Socioeconomic History  . Marital status: Married    Spouse name: Not on file  . Number of children: Not on file  . Years of education: Not on file  . Highest education level: Not on file  Occupational History  . Not on file  Social Needs  . Financial resource strain: Not on file  . Food insecurity:    Worry: Not on file    Inability: Not on file  . Transportation needs:    Medical: Not on file    Non-medical: Not on file  Tobacco Use  . Smoking status: Former Smoker    Packs/day: 1.00    Years: 20.00    Pack years: 20.00    Types: Cigarettes    Last attempt to quit: 03/24/1990    Years since quitting: 27.1  . Smokeless tobacco: Former Network engineer and Sexual Activity  . Alcohol use: Yes    Alcohol/week: 3.6 oz    Types: 6 Glasses of wine per week    Comment: ocasional use   . Drug use: No  . Sexual activity: Not on file  Lifestyle  . Physical activity:    Days per week: Not on file    Minutes per session: Not on file  . Stress: Not on file  Relationships  . Social connections:    Talks on phone: Not on file    Gets together: Not on file    Attends religious service: Not on file    Active member of club or organization: Not on file    Attends meetings of clubs or organizations: Not on file    Relationship status: Not on file  Other Topics Concern  . Not on file  Social History Narrative  . Not on file    Outpatient Encounter Medications as of 05/27/2017  Medication Sig  . aspirin EC 81 MG tablet Take 81 mg by mouth daily.  Marland Kitchen atorvastatin (LIPITOR) 10 MG tablet Take 10 mg by mouth daily.  . Calcium Carb-Cholecalciferol (CALCIUM 600 + D PO) Take 2 tablets by mouth daily.  .  cycloSPORINE (RESTASIS) 0.05 % ophthalmic emulsion Place 1 drop into both eyes 2 (two) times daily.   Marland Kitchen  Flaxseed, Linseed, (FLAX SEED OIL) 1000 MG CAPS Take 1,000 mg by mouth daily.  . Ginger, Zingiber officinalis, (GINGER ROOT) 550 MG CAPS Take 550 mg by mouth daily.  . Multiple Vitamins-Minerals (ONE DAILY WOMENS 50+ PO) Take 1 tablet by mouth daily.  . pantoprazole (PROTONIX) 40 MG tablet Take 40-80 mg by mouth daily. 40 mg twice daily for one week, then 40 mg daily until finished  . POTASSIUM PO Take 1 tablet by mouth daily.  . TURMERIC PO Take 400 mg by mouth daily.   No facility-administered encounter medications on file as of 05/27/2017.     Activities of Daily Living In your present state of health, do you have any difficulty performing the following activities: 05/27/2017 02/01/2017  Hearing? N N  Vision? N N  Difficulty concentrating or making decisions? N N  Walking or climbing stairs? N N  Dressing or bathing? N N  Doing errands, shopping? N N  Preparing Food and eating ? N -  Using the Toilet? N -  In the past six months, have you accidently leaked urine? Y -  Comment requsted med again from Dr. Elease Hashimoto  -  Do you have problems with loss of bowel control? N -  Managing your Medications? N -  Managing your Finances? N -  Housekeeping or managing your Housekeeping? N -  Some recent data might be hidden    Patient Care Team: Eulas Post, MD as PCP - General   Assessment:   This is a routine wellness examination for Reggie.  Exercise Activities and Dietary recommendations Current Exercise Habits: Structured exercise class, Type of exercise: stretching;strength training/weights;Other - see comments(zumba and deep water aerobics), Time (Minutes): 60, Frequency (Times/Week): 6, Weekly Exercise (Minutes/Week): 360, Intensity: Moderate  Goals    . Weight (lb) < 150 lb (68 kg)     Give up wine (one glass)        Fall Risk Fall Risk  05/27/2017 04/03/2016 03/30/2015  09/29/2014 09/21/2014  Falls in the past year? Yes No No No No  Comment just when she had the colitis  - - - -     Depression Screen PHQ 2/9 Scores 05/27/2017 04/03/2016 03/30/2015 09/29/2014  PHQ - 2 Score 0 0 0 0    Cognitive Function MMSE - Mini Mental State Exam 05/27/2017 04/03/2016  Not completed: (No Data) (No Data)   C/o of not remembering names;   as trying to come up with [facility]. Neurologist has confirmed she does not have    Ad8 score reviewed for issues:  Issues making decisions:  Less interest in hobbies / activities:  Repeats questions, stories (family complaining):  Trouble using ordinary gadgets (microwave, computer, phone):  Forgets the month or year:   Mismanaging finances:   Remembering appts:  Daily problems with thinking and/or memory: Ad8 score is= 0         Immunization History  Administered Date(s) Administered  . Hepatitis A, Adult 08/15/2014, 01/16/2015  . Influenza Split 11/23/2010  . Influenza, High Dose Seasonal PF 01/19/2013, 01/03/2014, 01/16/2015, 12/21/2015, 12/10/2016  . Pneumococcal Conjugate-13 01/19/2013  . Pneumococcal Polysaccharide-23 02/26/2007, 03/30/2015  . Td 02/26/2004  . Zoster 11/23/2010      Screening Tests Health Maintenance  Topic Date Due  . Hepatitis C Screening  1945-04-27  . TETANUS/TDAP  02/25/2014  . INFLUENZA VACCINE  09/25/2017  . MAMMOGRAM  02/08/2019  . COLONOSCOPY  02/03/2022  . DEXA SCAN  Completed  . PNA vac Low Risk Adult  Completed         Plan:      PCP Notes   Health Maintenance  will have hep c at her next blood draw  Educated regarding tetanus (tdap) Educated regading shingrix   Abnormal Screens  none  Referrals  none  Patient concerns; Last OV, she asked Dr. Elease Hashimoto for [place] she is not having an infection Tried Mrybetric and ? Vesicare;   Will send Basket note   Son's wife and kids are not doing well since her son died The children are 15 and 59    She is detaching   Nurse Concerns; As noted   Next PCP apt TBS        I have personally reviewed and noted the following in the patient's chart:   . Medical and social history . Use of alcohol, tobacco or illicit drugs  . Current medications and supplements . Functional ability and status . Nutritional status . Physical activity . Advanced directives . List of other physicians . Hospitalizations, surgeries, and ER visits in previous 12 months . Vitals . Screenings to include cognitive, depression, and falls . Referrals and appointments  In addition, I have reviewed and discussed with patient certain preventive protocols, quality metrics, and best practice recommendations. A written personalized care plan for preventive services as well as general preventive health recommendations were provided to patient.     EPPIR,JJOAC, RN  05/27/2017   Agree with assessment as above.  Sent in trial of Vesicare 5 mg po qd.    Eulas Post MD [facility]

## 2017-05-27 NOTE — Telephone Encounter (Signed)
I will send in rx for Vesicare.  Thanks!

## 2017-05-27 NOTE — Addendum Note (Signed)
Addended by: Eulas Post on: 05/27/2017 05:52 PM   Modules accepted: Orders

## 2017-05-30 ENCOUNTER — Other Ambulatory Visit: Payer: Self-pay

## 2017-05-30 NOTE — Progress Notes (Unsigned)
Order for vesicare completed /sh

## 2017-07-14 DIAGNOSIS — L570 Actinic keratosis: Secondary | ICD-10-CM | POA: Diagnosis not present

## 2017-07-14 DIAGNOSIS — Z85828 Personal history of other malignant neoplasm of skin: Secondary | ICD-10-CM | POA: Diagnosis not present

## 2017-07-14 DIAGNOSIS — L821 Other seborrheic keratosis: Secondary | ICD-10-CM | POA: Diagnosis not present

## 2017-07-14 DIAGNOSIS — L820 Inflamed seborrheic keratosis: Secondary | ICD-10-CM | POA: Diagnosis not present

## 2017-08-12 ENCOUNTER — Encounter: Payer: Self-pay | Admitting: Family Medicine

## 2017-08-12 MED ORDER — MIRABEGRON ER 25 MG PO TB24: 25.0000 mg | ORAL_TABLET | Freq: Every day | ORAL | 1 refills | Status: DC

## 2017-10-09 DIAGNOSIS — I8312 Varicose veins of left lower extremity with inflammation: Secondary | ICD-10-CM | POA: Diagnosis not present

## 2017-10-09 DIAGNOSIS — I83812 Varicose veins of left lower extremities with pain: Secondary | ICD-10-CM | POA: Diagnosis not present

## 2017-10-14 DIAGNOSIS — I87392 Chronic venous hypertension (idiopathic) with other complications of left lower extremity: Secondary | ICD-10-CM | POA: Diagnosis not present

## 2017-10-14 DIAGNOSIS — M79605 Pain in left leg: Secondary | ICD-10-CM | POA: Diagnosis not present

## 2017-12-15 ENCOUNTER — Ambulatory Visit: Payer: Medicare Other

## 2017-12-18 ENCOUNTER — Ambulatory Visit (INDEPENDENT_AMBULATORY_CARE_PROVIDER_SITE_OTHER): Payer: Medicare Other | Admitting: *Deleted

## 2017-12-18 DIAGNOSIS — Z23 Encounter for immunization: Secondary | ICD-10-CM | POA: Diagnosis not present

## 2017-12-18 IMAGING — CT CT ABD-PELV W/ CM
2 of 5 series · 15 of 46 positions shown, 17 images · IV contrast (APPLIED)
Comparison: Chest CT dated 11/01/2015

CLINICAL DATA: 71-year-old female with history of squamous cell
carcinoma of the skin presents with pain less rectal bleeding.

EXAM:
CT ABDOMEN AND PELVIS WITH CONTRAST
TECHNIQUE: Multidetector CT imaging of the abdomen and pelvis was performed
using the standard protocol following bolus administration of
intravenous contrast.
CONTRAST:  <See Chart> 0SMNTQ-LPP IOPAMIDOL (0SMNTQ-LPP) INJECTION
61%

[Series 2: axial st · axial · 0.83mm/px · z∈[-258,+132]mm · 12 of 92 slices shown, 14 images]
[im 7/92  soft-tissue]
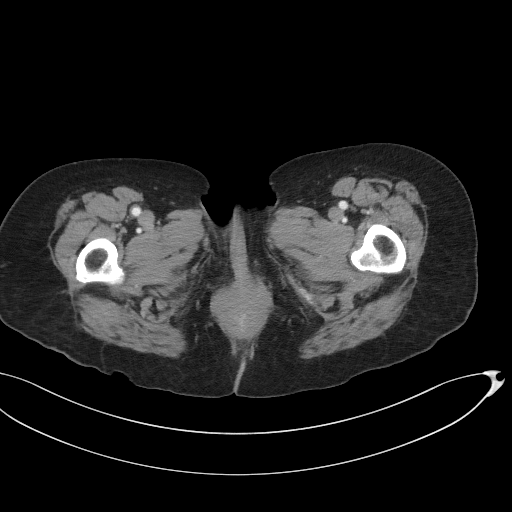
[im 7/92  bone]
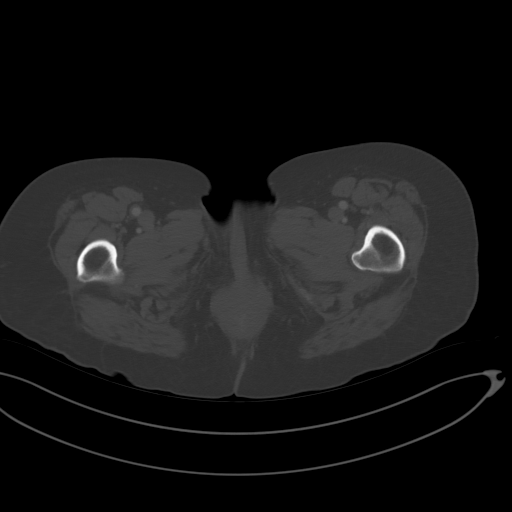
[im 13/92  soft-tissue]
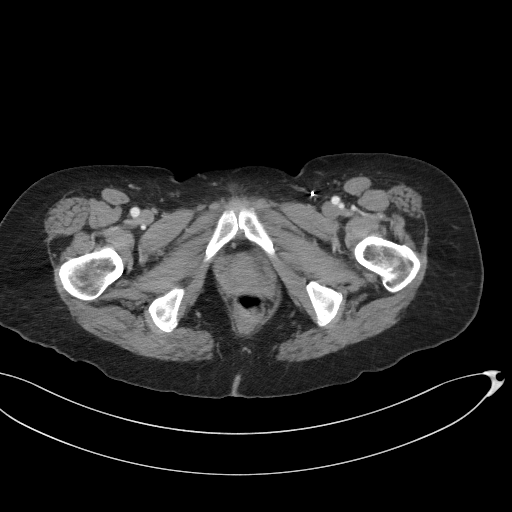
[im 19/92  soft-tissue]
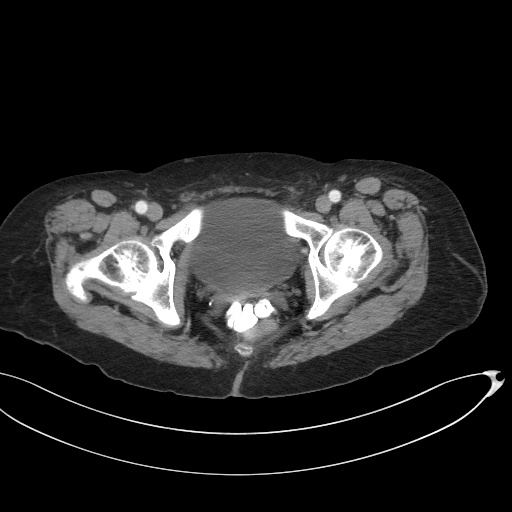
[im 31/92  soft-tissue]
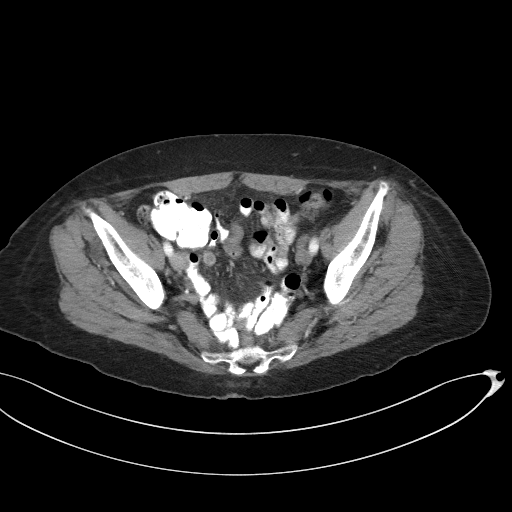
[im 37/92  soft-tissue]
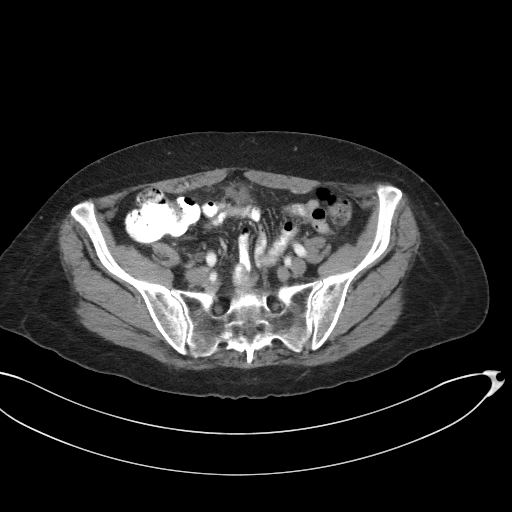
[im 43/92  soft-tissue]
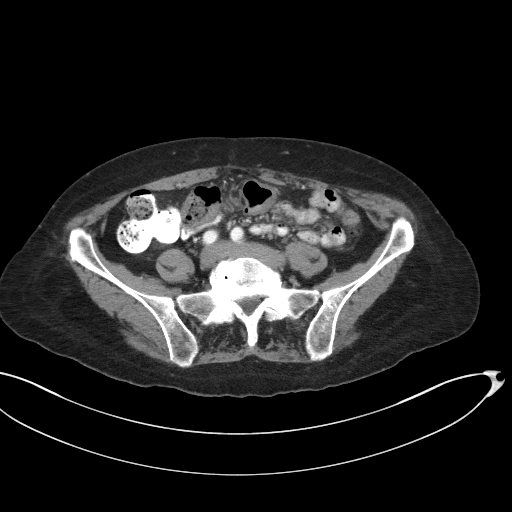
[im 49/92  soft-tissue]
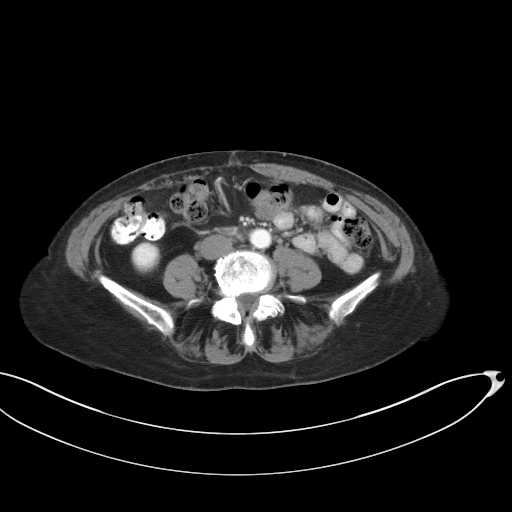
[im 55/92  soft-tissue]
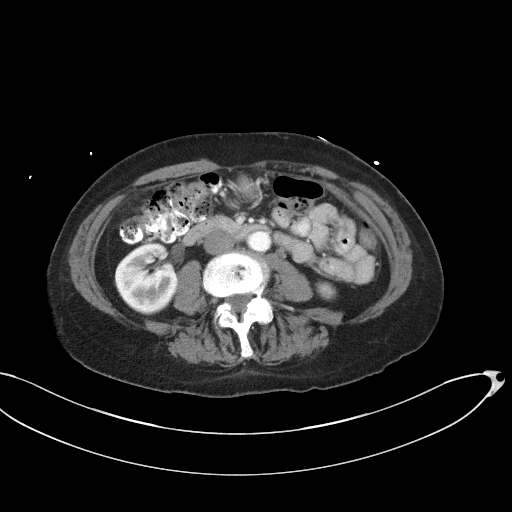
[im 61/92  soft-tissue]
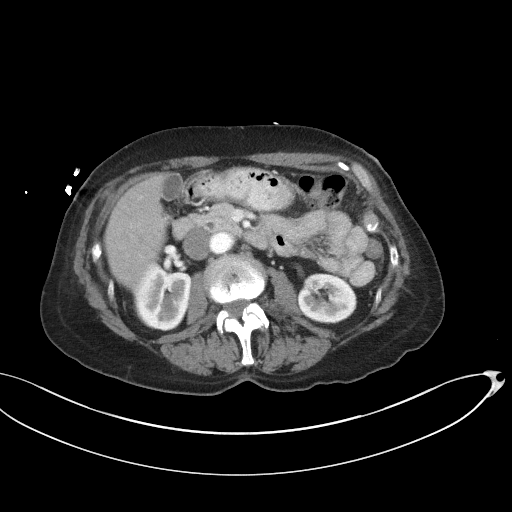
[im 61/92  bone]
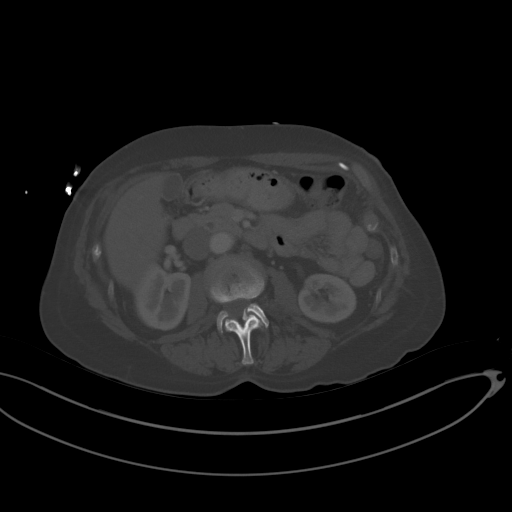
[im 73/92  soft-tissue]
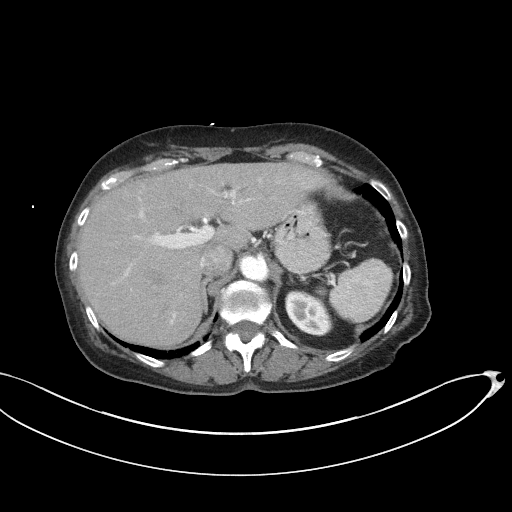
[im 79/92  soft-tissue]
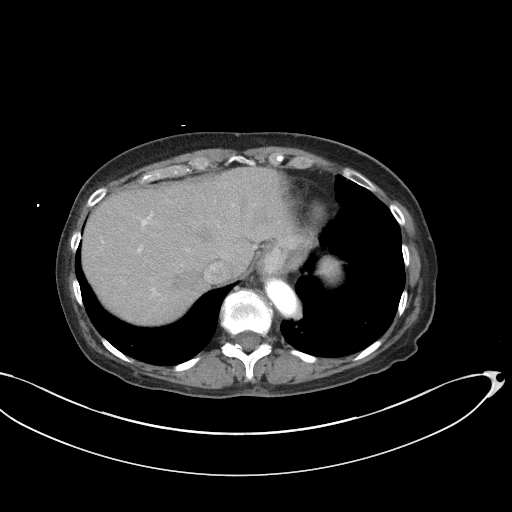
[im 85/92  soft-tissue]
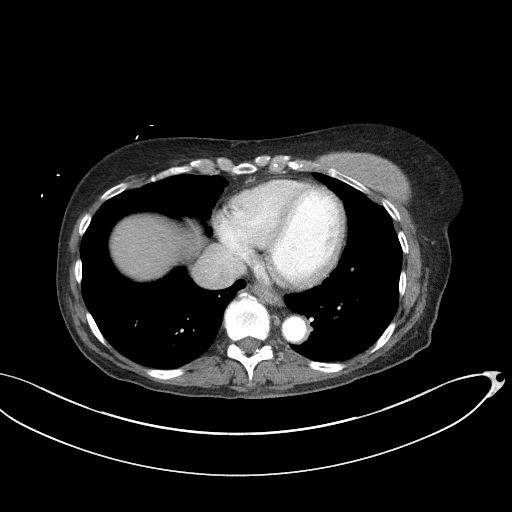

[Series 5: coronal st · coronal · 0.77mm/px · 3 of 75 slices shown]
[im 25/75  soft-tissue]
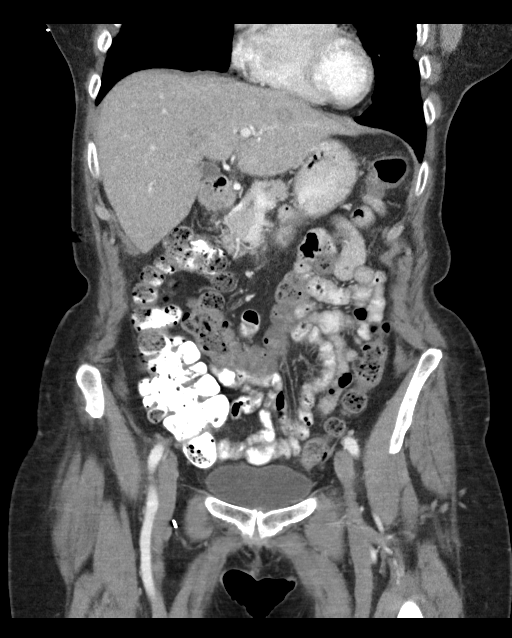
[im 33/75  soft-tissue]
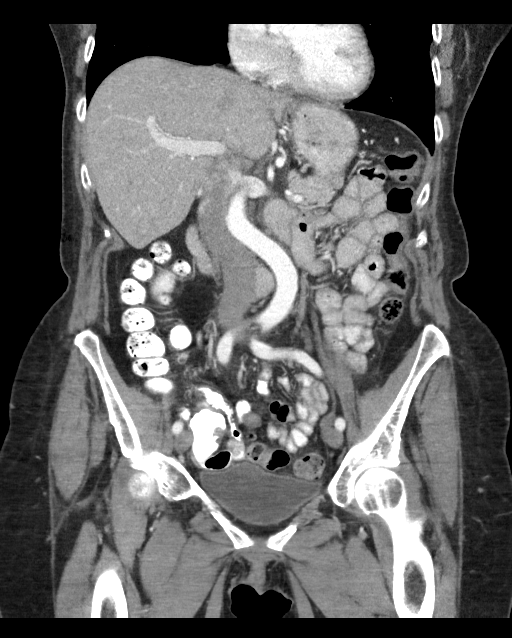
[im 42/75  soft-tissue]
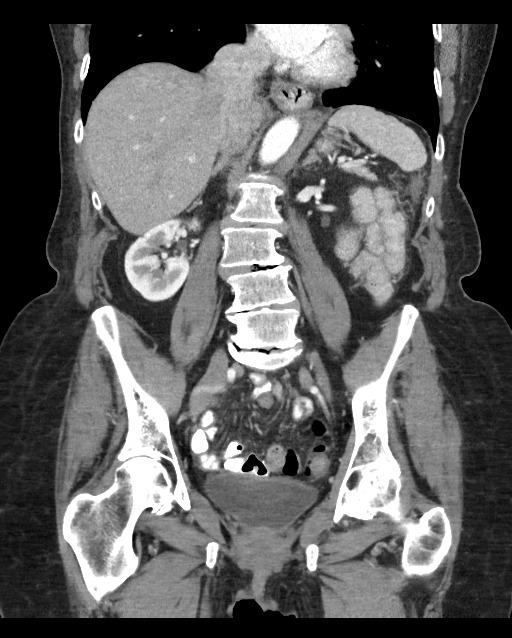

[15 of 46 positions shown; findings below may reference images not displayed]

FINDINGS: Lower chest: There is a 3 mm subpleural nodule at the left lung base
(Series 2, image 12) similar to prior CT. The visualized lung bases
are otherwise clear.

There is no intra-abdominal free air or free fluid.

Hepatobiliary: Ill-defined area of increased enhancement with
apparent central scar in the left lobe of the liver measuring
approximately 3 x 3 cm is not well characterized but likely
represents a focal FNH versus a hemangioma. Other etiologies are not
excluded. Further characterization with nonemergent MRI without and
with contrast is recommended. The liver is otherwise unremarkable.
No intrahepatic biliary ductal dilatation. The gallbladder is
unremarkable.

Pancreas: Unremarkable. No pancreatic ductal dilatation or
surrounding inflammatory changes.

Spleen: Normal in size without focal abnormality.

Adrenals/Urinary Tract: The adrenal glands are unremarkable. The
kidneys, visualized ureters, and urinary bladder appear
unremarkable.

Stomach/Bowel: There is a small hiatal hernia. There is mild
segmental thickening of the splenic flexure and proximal descending
colon which may be chronic or related to underdistention. Colitis is
not excluded. Clinical correlation is recommended. Mild edema of the
distal transverse colon may also be present. There is no bowel
obstruction. The appendix is unremarkable.

Vascular/Lymphatic: The aorta is tortuous. No aneurysmal dilatation
or dissection. The origins of the celiac axis, SMA, IMA are patent.
No portal venous gas. There is prominence of the junction of the IVC
with right atrium similar to prior CT. There is no adenopathy.

Reproductive: Hysterectomy.  No pelvic mass.

Other: Bilateral inguinal surgical clips noted. Partially visualized
bilateral breast implants.

Musculoskeletal: Degenerative changes of the spine. No acute osseous
pathology.
IMPRESSION: 1. Under distention versus colitis involving the transverse and
proximal descending colon. Clinical correlation is recommended. No
bowel obstruction. Normal appendix.
2. **An incidental finding of potential clinical significance has
been found. Ill-defined enhancing lesion in the left lobe of the
liver, likely a FNH. Further characterization with MRI without and
with contrast is recommended.**

## 2017-12-26 ENCOUNTER — Telehealth: Payer: Self-pay | Admitting: Family Medicine

## 2017-12-26 NOTE — Telephone Encounter (Signed)
Called pt and pt advised to contact [facility] for refill. Prescription was written on 08/12/17 with 1 refill. Pt states she did not receive prescription until 10/21/17. Pt notified that she should have another refill available at [facility] and if not pharmacy could send electronic request for refill to the office. Understanding verbalized and pt states she will contact [facility].

## 2017-12-26 NOTE — Telephone Encounter (Signed)
Copied from [place] [id number]. Topic: Quick Communication - Rx Refill/Question >> Dec 26, 2017  1:09 PM Keene Breath wrote: Medication: mirabegron ER (MYRBETRIQ) 25 MG TB24 tablet  Patient called to request a refill for the above medication.  CB# [phone]   Preferred Pharmacy (with phone number or street name): [place], [place] [phone] (Phone) [phone] (Fax)

## 2018-01-19 ENCOUNTER — Other Ambulatory Visit: Payer: Self-pay | Admitting: Family Medicine

## 2018-01-19 DIAGNOSIS — Z85828 Personal history of other malignant neoplasm of skin: Secondary | ICD-10-CM | POA: Diagnosis not present

## 2018-01-19 DIAGNOSIS — L570 Actinic keratosis: Secondary | ICD-10-CM | POA: Diagnosis not present

## 2018-01-19 DIAGNOSIS — D225 Melanocytic nevi of trunk: Secondary | ICD-10-CM | POA: Diagnosis not present

## 2018-01-19 DIAGNOSIS — D1801 Hemangioma of skin and subcutaneous tissue: Secondary | ICD-10-CM | POA: Diagnosis not present

## 2018-01-19 DIAGNOSIS — L821 Other seborrheic keratosis: Secondary | ICD-10-CM | POA: Diagnosis not present

## 2018-01-19 DIAGNOSIS — L309 Dermatitis, unspecified: Secondary | ICD-10-CM | POA: Diagnosis not present

## 2018-01-19 DIAGNOSIS — L820 Inflamed seborrheic keratosis: Secondary | ICD-10-CM | POA: Diagnosis not present

## 2018-03-06 DIAGNOSIS — Z1231 Encounter for screening mammogram for malignant neoplasm of breast: Secondary | ICD-10-CM | POA: Diagnosis not present

## 2018-03-06 LAB — HM MAMMOGRAPHY

## 2018-04-07 DIAGNOSIS — M79671 Pain in right foot: Secondary | ICD-10-CM | POA: Diagnosis not present

## 2018-04-07 DIAGNOSIS — M19071 Primary osteoarthritis, right ankle and foot: Secondary | ICD-10-CM | POA: Diagnosis not present

## 2018-04-10 DIAGNOSIS — M542 Cervicalgia: Secondary | ICD-10-CM | POA: Diagnosis not present

## 2018-04-10 DIAGNOSIS — M7062 Trochanteric bursitis, left hip: Secondary | ICD-10-CM | POA: Diagnosis not present

## 2018-04-10 DIAGNOSIS — M4722 Other spondylosis with radiculopathy, cervical region: Secondary | ICD-10-CM | POA: Diagnosis not present

## 2018-04-13 ENCOUNTER — Ambulatory Visit (INDEPENDENT_AMBULATORY_CARE_PROVIDER_SITE_OTHER): Payer: Medicare Other | Admitting: Family Medicine

## 2018-04-13 ENCOUNTER — Other Ambulatory Visit: Payer: Self-pay

## 2018-04-13 ENCOUNTER — Encounter: Payer: Self-pay | Admitting: Family Medicine

## 2018-04-13 ENCOUNTER — Telehealth: Payer: Self-pay | Admitting: Family Medicine

## 2018-04-13 VITALS — BP 118/86 | HR 64 | Temp 98.2°F | Ht 67.5 in | Wt 157.8 lb

## 2018-04-13 DIAGNOSIS — M8580 Other specified disorders of bone density and structure, unspecified site: Secondary | ICD-10-CM | POA: Diagnosis not present

## 2018-04-13 DIAGNOSIS — Z780 Asymptomatic menopausal state: Secondary | ICD-10-CM

## 2018-04-13 DIAGNOSIS — M720 Palmar fascial fibromatosis [Dupuytren]: Secondary | ICD-10-CM

## 2018-04-13 DIAGNOSIS — M150 Primary generalized (osteo)arthritis: Secondary | ICD-10-CM | POA: Diagnosis not present

## 2018-04-13 DIAGNOSIS — M159 Polyosteoarthritis, unspecified: Secondary | ICD-10-CM

## 2018-04-13 DIAGNOSIS — M1990 Unspecified osteoarthritis, unspecified site: Secondary | ICD-10-CM | POA: Insufficient documentation

## 2018-04-13 NOTE — Patient Instructions (Signed)
Osteopenia  Osteopenia is a loss of thickness (density) inside of the bones. Another name for osteopenia is low bone mass. Mild osteopenia is a normal part of aging. It is not a disease, and it does not cause symptoms. However, if you have osteopenia and continue to lose bone mass, you could develop a condition that causes the bones to become thin and break more easily (osteoporosis). You may also lose some height, have back pain, and have a stooped posture. Although osteopenia is not a disease, making changes to your lifestyle and diet can help to prevent osteopenia from developing into osteoporosis. What are the causes? Osteopenia is caused by loss of calcium in the bones.  Bones are constantly changing. Old bone cells are continually being replaced with new bone cells. This process builds new bone. The mineral calcium is needed to build new bone and maintain bone density. Bone density is usually highest around age 35. After that, most people's bodies cannot replace all the bone they have lost with new bone. What increases the risk? You are more likely to develop this condition if:  You are older than age 50.  You are a woman who went through menopause early.  You have a long illness that keeps you in bed.  You do not get enough exercise.  You lack certain nutrients (malnutrition).  You have an overactive thyroid gland (hyperthyroidism).  You smoke.  You drink a lot of alcohol.  You are taking medicines that weaken the bones, such as steroids. What are the signs or symptoms? This condition does not cause any symptoms. You may have a slightly higher risk for bone breaks (fractures), so getting fractures more easily than normal may be an indication of osteopenia. How is this diagnosed? Your health care provider can diagnose this condition with a special type of X-ray exam that measures bone density (dual-energy X-ray absorptiometry, DEXA). This test can measure bone density in your  hips, spine, and wrists. Osteopenia has no symptoms, so this condition is usually diagnosed after a routine bone density screening test is done for osteoporosis. This routine screening is usually done for:  Women who are age 65 or older.  Men who are age 70 or older. If you have risk factors for osteopenia, you may have the screening test at an earlier age. How is this treated? Making dietary and lifestyle changes can lower your risk for osteoporosis. If you have severe osteopenia that is close to becoming osteoporosis, your health care provider may prescribe medicines and dietary supplements such as calcium and vitamin D. These supplements help to rebuild bone density. Follow these instructions at home:   Take over-the-counter and prescription medicines only as told by your health care provider. These include vitamins and supplements.  Eat a diet that is high in calcium and vitamin D. ? Calcium is found in dairy products, beans, salmon, and leafy green vegetables like spinach and broccoli. ? Look for foods that have vitamin D and calcium added to them (fortified foods), such as orange juice, cereal, and bread.  Do 30 or more minutes of a weight-bearing exercise every day, such as walking, jogging, or playing a sport. These types of exercises strengthen the bones.  Take precautions at home to lower your risk of falling, such as: ? Keeping rooms well-lit and free of clutter, such as cords. ? Installing safety rails on stairs. ? Using rubber mats in the bathroom or other areas that are often wet or slippery.  Do not use   any products that contain nicotine or tobacco, such as cigarettes and e-cigarettes. If you need help quitting, ask your health care provider.  Avoid alcohol or limit alcohol intake to no more than 1 drink a day for nonpregnant women and 2 drinks a day for men. One drink equals 12 oz of beer, 5 oz of wine, or 1 oz of hard liquor.  Keep all follow-up visits as told by your  health care provider. This is important. Contact a health care provider if:  You have not had a bone density screening for osteoporosis and you are: ? A woman, age 38 or older. ? A man, age 61 or older.  You are a postmenopausal woman who has not had a bone density screening for osteoporosis.  You are older than age 58 and you want to know if you should have bone density screening for osteoporosis. Summary  Osteopenia is a loss of thickness (density) inside of the bones. Another name for osteopenia is low bone mass.  Osteopenia is not a disease, but it may increase your risk for a condition that causes the bones to become thin and break more easily (osteoporosis).  You may be at risk for osteopenia if you are older than age 43 or if you are a woman who went through early menopause.  Osteopenia does not cause any symptoms, but it can be diagnosed with a bone density screening test.  Dietary and lifestyle changes are the first treatment for osteopenia. These may lower your risk for osteoporosis. This information is not intended to replace advice given to you by your health care provider. Make sure you discuss any questions you have with your health care provider. Document Released: 11/20/2016 Document Revised: 11/20/2016 Document Reviewed: 11/20/2016 Elsevier Interactive Patient Education  2019 [employer].  Set up [employer] for [facility]. Dupuytren's Contracture Dupuytren's contracture is a condition in which tissue under the skin of the palm becomes thick. This causes one or more of the fingers to curl inward (contract) toward the palm. After a while, the fingers may not be able to straighten out. This condition affects some or all of the fingers and the palm of the hand. This condition may affect one or both hands. Dupuytren's contracture is a long-term (chronic) condition that develops (progresses) slowly over time. There is no cure, but symptoms can be managed and progression can be  slowed with treatment. This condition is usually not dangerous or painful, but it can interfere with everyday tasks. What are the causes?  This condition is caused by tissue (fascia) in the palm that gets thicker and tighter. When the fascia thickens, it pulls on the cords of tissue (tendons) that control finger movement. This causes the fingers to contract. The cause of fascia thickening is not known. However, the condition is often passed along from parent to child (inherited). What increases the risk? The following factors may make you more likely to develop this condition:  Being 32 years of age or older.  Being female.  Having a family history of this condition.  Using tobacco products, including cigarettes, chewing tobacco, and e-cigarettes.  Drinking alcohol excessively.  Having diabetes.  Having a seizure disorder. What are the signs or symptoms? Early symptoms of this condition may include:  Thick, puckered skin on the hand.  One or more lumps (nodules) on the palm. Nodules may be tender when they first appear, but they are generally painless. Later symptoms of this condition may include:  Thick cords of tissue  in the palm.  Fingers curled up toward the palm.  Inability to straighten the fingers into their normal position. Though this condition is usually painless, you may have discomfort when holding or grabbing objects. How is this diagnosed? This condition is diagnosed with a physical exam, which may include:  Looking at your hands and feeling your palms. This is to check for thickened fascia and nodules.  Measuring finger motion.  Doing the [place] tabletop test. You may be asked to try to put your hand on a surface, with your palm down and your fingers straight out. How is this treated? There is no cure for this condition, but treatment can relieve discomfort and make symptoms more manageable. Treatment options may include:  Physical therapy. This can  strengthen your hand and increase flexibility.  Occupational therapy. This can help you with everyday tasks that may be more difficult because of your condition.  Shots (injections). Substances may be injected into your hand, such as: ? Medicines that help to decrease swelling (corticosteroids). ? Proteins (collagenase) to weaken thick tissue. After a collagenase injection, your health care provider may stretch your fingers.  Needle aponeurotomy. A needle is pushed through the skin and into the fascia. Moving the needle against the fascia can weaken or break up the thick tissue.  Surgery. This may be needed if your condition causes discomfort or interferes with everyday activities. Physical therapy is usually needed after surgery. No treatment is guaranteed to cure this condition. Recurrence of symptoms is common. Follow these instructions at home: Hand care  Take these actions to help protect your hand from possible injury: ? Use tools that have padded grips. ? Wear protective gloves while you work with your hands. ? Avoid repetitive hand movements. General instructions  Take over-the-counter and prescription medicines only as told by your health care provider.  Manage any other conditions that you have, such as diabetes.  If physical therapy was prescribed, do exercises as told by your health care provider.  Do not use any products that contain nicotine or tobacco, such as cigarettes, e-cigarettes, and chewing tobacco. If you need help quitting, ask your health care provider.  If you drink alcohol: ? Limit how much you use to:  0-1 drink a day for women.  0-2 drinks a day for men. ? Be aware of how much alcohol is in your drink. In the [place] one drink equals one 12 oz bottle of beer (355 mL), one 5 oz glass of wine (148 mL), or one 1 oz glass of hard liquor (44 mL).  Keep all follow-up visits as told by your health care provider. This is important. Contact a health care  provider if:  You develop new symptoms, or your symptoms get worse.  You have pain that gets worse or does not get better with medicine.  You have difficulty or discomfort with everyday tasks.  You develop numbness or tingling. Get help right away if:  You have severe pain.  Your fingers change color or become unusually cold. Summary  Dupuytren's contracture is a condition in which tissue under the skin of the palm becomes thick.  This condition is caused by tissue (fascia) that thickens. When it thickens, it pulls on the cords of tissue (tendons) that control finger movement and makes the fingers to contract.  You are more likely to develop this condition if you are a man, are over 61 years of age, have a family history of the condition, and drink a lot of  alcohol.  This condition can be treated with physical and occupational therapy, injections, and surgery.  Follow instructions about how to care for your hand. Get help right away if you have severe pain or your fingers change color or become cold. This information is not intended to replace advice given to you by your health care provider. Make sure you discuss any questions you have with your health care provider. Document Released: 12/09/2008 Document Revised: 09/02/2017 Document Reviewed: 09/02/2017 Elsevier Interactive Patient Education  2019 [employer].

## 2018-04-13 NOTE — Progress Notes (Signed)
Subjective:     [name], female   DOB: 07/16/1945, 73 y.o.   MRN: [id number]  HPI Patient here to discuss multiple issues as follows  She had DEXA scan 2017 which showed osteopenia but low FRAX score.  She would like to get follow-up.  She has not had any recent fractures.  She has had multiple degenerative problems including degenerative problems of the neck and back as well as right foot.  She has seen orthopedist regarding that.  She is currently on prednisone taper and getting physical therapy.  Positive family history of osteoporosis in her mother.  She is non-smoker.  She does have longstanding history of GERD and takes omeprazole which controls things fairly well.  She has been unable to taper off omeprazole.  She does take daily calcium and vitamin D  Patient has contracture left hand mostly fourth digit.  Nonpainful.  No functional immobility.  Past Medical History:  Diagnosis Date  . Arthritis    right knee and foot- OSteo   . Asthma    treated in 20's and 30's  . 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 Complication of anesthesia    very slow to awaken   . GERD (gastroesophageal reflux disease)    prn Prilosec  . Head injury, closed    10/26/15- years ago - 30ish  . Shortness of breath dyspnea    with exertion  . Vaso vagal episode    passed out 2 times, close to passing out 3 times  . Vertigo    was treated with rehab March and April 2017, does exercises if she begins to have symptoms   Past Surgical History:  Procedure Laterality Date  . ABDOMINAL HYSTERECTOMY  1993   menorrhagia.  Marland Kitchen BREAST ENHANCEMENT SURGERY  1978  . COLONOSCOPY W/ POLYPECTOMY    . COLONOSCOPY WITH PROPOFOL N/A 02/03/2017   Procedure: COLONOSCOPY WITH PROPOFOL;  Surgeon: Carol Ada, MD;  Location: [facility];  Service: Endoscopy;  Laterality: N/A;  . KNEE ARTHROSCOPY W/ MENISCAL REPAIR Right   . 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 TUBAL LIGATION  1972  . [place]    reports that she quit smoking about 28 years ago. Her smoking use included cigarettes. She has a 20.00 pack-year smoking history. She has quit using smokeless tobacco. She reports current alcohol use of about 6.0 standard drinks of alcohol per week. She reports that she does not use drugs. family history includes Cancer (age of onset: 51) in her father; Heart disease (age of onset: 29) in her mother. Allergies  Allergen Reactions  . Other Rash    Derald Macleod cooling pads, severe itching  . Adhesive [Tape] Hives    Use paper tape only     Review of Systems  Constitutional: Negative for fatigue and unexpected weight change.  Respiratory: Negative for cough, chest tightness, shortness of breath and wheezing.   Cardiovascular: Negative for chest pain, palpitations and leg swelling.  Musculoskeletal: Positive for arthralgias.  Neurological: Negative for dizziness, seizures, syncope, weakness, light-headedness and headaches.       Objective:   Physical Exam Constitutional:      Appearance: She is well-developed.  Eyes:     Pupils: Pupils are equal, round, and reactive to light.  Neck:     Musculoskeletal: Neck supple.  Thyroid: No thyromegaly.     Vascular: No JVD.  Cardiovascular:     Rate and Rhythm: Normal rate and regular rhythm.     Heart sounds: No gallop.   Pulmonary:     Effort: Pulmonary effort is normal. No respiratory distress.     Breath sounds: Normal breath sounds. No wheezing or rales.  Musculoskeletal:     Comments: Patient has Dupuytren's contracture left hand fourth digit.  Nontender.  Full range of motion all digits of the hand  Neurological:     Mental Status: She is alert.        Assessment:     #1 history of osteopenia.  Last DEXA scan was almost 3 years ago.  #2 Dupuytren contracture left  hand-asymptomatic  #3 degenerative arthritis involving multiple joints    Plan:     -Set up repeat DEXA scan -Continue daily calcium and vitamin D and weightbearing exercise -She will continue follow-up with orthopedics regarding her arthritis concerns -We discussed the Dupuytren's contracture.  She is asymptomatic and we recommend observation and no interventions at this time  Eulas Post MD [facility] at [facility]

## 2018-04-13 NOTE — Telephone Encounter (Signed)
Great! This has been noted in the chart!

## 2018-04-13 NOTE — Telephone Encounter (Signed)
Copied from Rafael Hernandez [id number]. Topic: Quick Communication - Rx Refill/Question >> Apr 13, 2018 12:45 PM Mia Peterson wrote: Medication: Mia Peterson called and stated that she takes 40 MG of atorvastatin (LIPITOR). Please advise

## 2018-04-17 DIAGNOSIS — M5412 Radiculopathy, cervical region: Secondary | ICD-10-CM | POA: Diagnosis not present

## 2018-04-17 DIAGNOSIS — S46911D Strain of unspecified muscle, fascia and tendon at shoulder and upper arm level, right arm, subsequent encounter: Secondary | ICD-10-CM | POA: Diagnosis not present

## 2018-04-17 DIAGNOSIS — M7062 Trochanteric bursitis, left hip: Secondary | ICD-10-CM | POA: Diagnosis not present

## 2018-04-17 DIAGNOSIS — M6281 Muscle weakness (generalized): Secondary | ICD-10-CM | POA: Diagnosis not present

## 2018-04-21 ENCOUNTER — Ambulatory Visit (INDEPENDENT_AMBULATORY_CARE_PROVIDER_SITE_OTHER)
Admission: RE | Admit: 2018-04-21 | Discharge: 2018-04-21 | Disposition: A | Discharge status: Home / Self Care | Payer: Medicare Other | Source: Ambulatory Visit | Attending: Family Medicine | Admitting: Family Medicine

## 2018-04-21 ENCOUNTER — Other Ambulatory Visit: Payer: Self-pay | Admitting: Family Medicine

## 2018-04-21 DIAGNOSIS — Z780 Asymptomatic menopausal state: Secondary | ICD-10-CM | POA: Diagnosis not present

## 2018-04-21 DIAGNOSIS — M4722 Other spondylosis with radiculopathy, cervical region: Secondary | ICD-10-CM | POA: Diagnosis not present

## 2018-04-22 DIAGNOSIS — M7062 Trochanteric bursitis, left hip: Secondary | ICD-10-CM | POA: Diagnosis not present

## 2018-04-22 DIAGNOSIS — M6281 Muscle weakness (generalized): Secondary | ICD-10-CM | POA: Diagnosis not present

## 2018-04-22 DIAGNOSIS — M5412 Radiculopathy, cervical region: Secondary | ICD-10-CM | POA: Diagnosis not present

## 2018-04-22 DIAGNOSIS — S46911D Strain of unspecified muscle, fascia and tendon at shoulder and upper arm level, right arm, subsequent encounter: Secondary | ICD-10-CM | POA: Diagnosis not present

## 2018-04-24 DIAGNOSIS — M5412 Radiculopathy, cervical region: Secondary | ICD-10-CM | POA: Diagnosis not present

## 2018-04-24 DIAGNOSIS — S46911D Strain of unspecified muscle, fascia and tendon at shoulder and upper arm level, right arm, subsequent encounter: Secondary | ICD-10-CM | POA: Diagnosis not present

## 2018-04-24 DIAGNOSIS — M6281 Muscle weakness (generalized): Secondary | ICD-10-CM | POA: Diagnosis not present

## 2018-04-24 DIAGNOSIS — M7062 Trochanteric bursitis, left hip: Secondary | ICD-10-CM | POA: Diagnosis not present

## 2018-04-28 DIAGNOSIS — S46911D Strain of unspecified muscle, fascia and tendon at shoulder and upper arm level, right arm, subsequent encounter: Secondary | ICD-10-CM | POA: Diagnosis not present

## 2018-04-28 DIAGNOSIS — M7062 Trochanteric bursitis, left hip: Secondary | ICD-10-CM | POA: Diagnosis not present

## 2018-04-28 DIAGNOSIS — M5412 Radiculopathy, cervical region: Secondary | ICD-10-CM | POA: Diagnosis not present

## 2018-05-01 DIAGNOSIS — M5412 Radiculopathy, cervical region: Secondary | ICD-10-CM | POA: Diagnosis not present

## 2018-05-01 DIAGNOSIS — M7062 Trochanteric bursitis, left hip: Secondary | ICD-10-CM | POA: Diagnosis not present

## 2018-05-01 DIAGNOSIS — M6281 Muscle weakness (generalized): Secondary | ICD-10-CM | POA: Diagnosis not present

## 2018-05-01 DIAGNOSIS — S46911D Strain of unspecified muscle, fascia and tendon at shoulder and upper arm level, right arm, subsequent encounter: Secondary | ICD-10-CM | POA: Diagnosis not present

## 2018-05-04 ENCOUNTER — Other Ambulatory Visit: Payer: Self-pay

## 2018-05-04 ENCOUNTER — Ambulatory Visit (INDEPENDENT_AMBULATORY_CARE_PROVIDER_SITE_OTHER): Payer: Medicare Other | Admitting: Family Medicine

## 2018-05-04 VITALS — BP 118/78 | Temp 97.9°F

## 2018-05-04 DIAGNOSIS — Z8262 Family history of osteoporosis: Secondary | ICD-10-CM | POA: Diagnosis not present

## 2018-05-04 DIAGNOSIS — M8580 Other specified disorders of bone density and structure, unspecified site: Secondary | ICD-10-CM

## 2018-05-04 DIAGNOSIS — K219 Gastro-esophageal reflux disease without esophagitis: Secondary | ICD-10-CM | POA: Diagnosis not present

## 2018-05-04 MED ORDER — ALENDRONATE SODIUM 70 MG PO TABS: 70.0000 mg | ORAL_TABLET | ORAL | 3 refills | Status: DC

## 2018-05-04 NOTE — Patient Instructions (Signed)
Osteopenia    Osteopenia is a loss of thickness (density) inside of the bones. Another name for osteopenia is low bone mass.  Mild osteopenia is a normal part of aging. It is not a disease, and it does not cause symptoms. However, if you have osteopenia and continue to lose bone mass, you could develop a condition that causes the bones to become thin and break more easily (osteoporosis). You may also lose some height, have back pain, and have a stooped posture. Although osteopenia is not a disease, making changes to your lifestyle and diet can help to prevent osteopenia from developing into osteoporosis.  What are the causes?  Osteopenia is caused by loss of calcium in the bones.   Bones are constantly changing. Old bone cells are continually being replaced with new bone cells. This process builds new bone. The mineral calcium is needed to build new bone and maintain bone density. Bone density is usually highest around age 35. After that, most people's bodies cannot replace all the bone they have lost with new bone.  What increases the risk?  You are more likely to develop this condition if:  · You are older than age 50.  · You are a woman who went through menopause early.  · You have a long illness that keeps you in bed.  · You do not get enough exercise.  · You lack certain nutrients (malnutrition).  · You have an overactive thyroid gland (hyperthyroidism).  · You smoke.  · You drink a lot of alcohol.  · You are taking medicines that weaken the bones, such as steroids.  What are the signs or symptoms?  This condition does not cause any symptoms. You may have a slightly higher risk for bone breaks (fractures), so getting fractures more easily than normal may be an indication of osteopenia.  How is this diagnosed?  Your health care provider can diagnose this condition with a special type of X-ray exam that measures bone density (dual-energy X-ray absorptiometry, DEXA). This test can measure bone density in your  hips, spine, and wrists.  Osteopenia has no symptoms, so this condition is usually diagnosed after a routine bone density screening test is done for osteoporosis. This routine screening is usually done for:  · Women who are age 65 or older.  · Men who are age 70 or older.  If you have risk factors for osteopenia, you may have the screening test at an earlier age.  How is this treated?  Making dietary and lifestyle changes can lower your risk for osteoporosis. If you have severe osteopenia that is close to becoming osteoporosis, your health care provider may prescribe medicines and dietary supplements such as calcium and vitamin D. These supplements help to rebuild bone density.  Follow these instructions at home:    · Take over-the-counter and prescription medicines only as told by your health care provider. These include vitamins and supplements.  · Eat a diet that is high in calcium and vitamin D.  ? Calcium is found in dairy products, beans, salmon, and leafy green vegetables like spinach and broccoli.  ? Look for foods that have vitamin D and calcium added to them (fortified foods), such as orange juice, cereal, and bread.  · Do 30 or more minutes of a weight-bearing exercise every day, such as walking, jogging, or playing a sport. These types of exercises strengthen the bones.  · Take precautions at home to lower your risk of falling, such as:  ?   Keeping rooms well-lit and free of clutter, such as cords.  ? Installing safety rails on stairs.  ? Using rubber mats in the bathroom or other areas that are often wet or slippery.  · Do not use any products that contain nicotine or tobacco, such as cigarettes and e-cigarettes. If you need help quitting, ask your health care provider.  · Avoid alcohol or limit alcohol intake to no more than 1 drink a day for nonpregnant women and 2 drinks a day for men. One drink equals 12 oz of beer, 5 oz of wine, or 1½ oz of hard liquor.  · Keep all follow-up visits as told by your  health care provider. This is important.  Contact a health care provider if:  · You have not had a bone density screening for osteoporosis and you are:  ? A woman, age 65 or older.  ? A man, age 70 or older.  · You are a postmenopausal woman who has not had a bone density screening for osteoporosis.  · You are older than age 50 and you want to know if you should have bone density screening for osteoporosis.  Summary  · Osteopenia is a loss of thickness (density) inside of the bones. Another name for osteopenia is low bone mass.  · Osteopenia is not a disease, but it may increase your risk for a condition that causes the bones to become thin and break more easily (osteoporosis).  · You may be at risk for osteopenia if you are older than age 50 or if you are a woman who went through early menopause.  · Osteopenia does not cause any symptoms, but it can be diagnosed with a bone density screening test.  · Dietary and lifestyle changes are the first treatment for osteopenia. These may lower your risk for osteoporosis.  This information is not intended to replace advice given to you by your health care provider. Make sure you discuss any questions you have with your health care provider.  Document Released: 11/20/2016 Document Revised: 11/20/2016 Document Reviewed: 11/20/2016  Elsevier Interactive Patient Education © 2019 [employer]

## 2018-05-04 NOTE — Progress Notes (Signed)
Subjective:     [name], female   DOB: [birth year], 73 y.o.   MRN: [id number]  HPI Patient is seen to discuss recent DEXA scan.  This came back osteopenia with T score -2.4 left femur.  This represented a 6% decrease in bone mass from prior DEXA scan 2 years prior.  Her lumbar spine went up a small amount but this was obviously likely related to osteoarthritis changes.  She has not had any falls or fractures.  She exercises regularly.  Takes calcium and vitamin D.  Positive family history of osteoporosis.  Non-smoker.  Drinks considerable amount of coffee.  She does have history of GERD but very well controlled with omeprazole.  She has never been on any specific therapies for osteoporosis or osteopenia.  Even though she was osteopenic, her 10-year FRAX score risk for hip fracture is 3.7%.  For any major osteoporotic fracture 14.2%.  Past Medical History:  Diagnosis Date  . Arthritis    right knee and foot- OSteo   . Asthma    treated in 20's and 30's  . 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 Complication of anesthesia    very slow to awaken   . GERD (gastroesophageal reflux disease)    prn Prilosec  . Head injury, closed    10/26/15- years ago - 30ish  . Shortness of breath dyspnea    with exertion  . Vaso vagal episode    passed out 2 times, close to passing out 3 times  . Vertigo    was treated with rehab March and April 2017, does exercises if she begins to have symptoms   Past Surgical History:  Procedure Laterality Date  . ABDOMINAL HYSTERECTOMY  1993   menorrhagia.  Marland Kitchen BREAST ENHANCEMENT SURGERY  1978  . COLONOSCOPY W/ POLYPECTOMY    . COLONOSCOPY WITH PROPOFOL N/A 02/03/2017   Procedure: COLONOSCOPY WITH PROPOFOL;  Surgeon: Carol Ada, MD;  [facility];  Service: Endoscopy;  Laterality: N/A;  . KNEE ARTHROSCOPY W/ MENISCAL REPAIR Right   . TOTAL KNEE ARTHROPLASTY Right 11/03/2015   Procedure: RIGHT TOTAL KNEE ARTHROPLASTY;  Surgeon: Dorna Leitz, MD;  Location: [place];  Service: Orthopedics;  Laterality: Right;  . TUBAL LIGATION  1972  . [place]    reports that she quit smoking about 28 years ago. Her smoking use included cigarettes. She has a 20.00 pack-year smoking history. She has quit using smokeless tobacco. She reports current alcohol use of about 6.0 standard drinks of alcohol per week. She reports that she does not use drugs. family history includes Cancer (age of onset: 68) in her father; Heart disease (age of onset: 50) in her mother. Allergies  Allergen Reactions  . Other Rash    Derald Macleod cooling pads, severe itching  . Adhesive [Tape] Hives    Use paper tape only     Review of Systems  Constitutional: Negative for appetite change and unexpected weight change.  HENT: Negative for trouble swallowing.   Cardiovascular: Negative for chest pain.       Objective:   Physical Exam Constitutional:      Appearance: Normal appearance.  Cardiovascular:     Rate and Rhythm: Normal rate and regular rhythm.  Pulmonary:     Effort: Pulmonary effort is normal.     Breath sounds: Normal breath sounds.  Neurological:     Mental Status: She is alert.  Assessment:     #1 osteopenia with FRAX score risk of hip fracture for 10 years 3.7% which is above threshold of 3%  #2 history of GERD which is well controlled with omeprazole  #3+ family history of osteoporosis    Plan:     -Continue with regular weightbearing exercise and calcium and vitamin D - we discussed various therapies for osteopenia/osteoporosis.  Even though she has history of some GERD this is been fairly well controlled with omeprazole.  We recommended cautious trial of Fosamax 70 mg once weekly with specific instructions given for use. -We discussed potential side effects. -We will plan repeat DEXA scan in 1 to 2 years to reassess  Eulas Post MD [facility] at [facility]

## 2018-05-05 DIAGNOSIS — S46911D Strain of unspecified muscle, fascia and tendon at shoulder and upper arm level, right arm, subsequent encounter: Secondary | ICD-10-CM | POA: Diagnosis not present

## 2018-05-05 DIAGNOSIS — M7062 Trochanteric bursitis, left hip: Secondary | ICD-10-CM | POA: Diagnosis not present

## 2018-05-05 DIAGNOSIS — M6281 Muscle weakness (generalized): Secondary | ICD-10-CM | POA: Diagnosis not present

## 2018-05-05 DIAGNOSIS — M5412 Radiculopathy, cervical region: Secondary | ICD-10-CM | POA: Diagnosis not present

## 2018-05-07 DIAGNOSIS — M6281 Muscle weakness (generalized): Secondary | ICD-10-CM | POA: Diagnosis not present

## 2018-05-07 DIAGNOSIS — S46911D Strain of unspecified muscle, fascia and tendon at shoulder and upper arm level, right arm, subsequent encounter: Secondary | ICD-10-CM | POA: Diagnosis not present

## 2018-05-07 DIAGNOSIS — M7062 Trochanteric bursitis, left hip: Secondary | ICD-10-CM | POA: Diagnosis not present

## 2018-05-07 DIAGNOSIS — M5412 Radiculopathy, cervical region: Secondary | ICD-10-CM | POA: Diagnosis not present

## 2018-05-12 DIAGNOSIS — M5412 Radiculopathy, cervical region: Secondary | ICD-10-CM | POA: Diagnosis not present

## 2018-05-12 DIAGNOSIS — S46911D Strain of unspecified muscle, fascia and tendon at shoulder and upper arm level, right arm, subsequent encounter: Secondary | ICD-10-CM | POA: Diagnosis not present

## 2018-05-12 DIAGNOSIS — M6281 Muscle weakness (generalized): Secondary | ICD-10-CM | POA: Diagnosis not present

## 2018-05-12 DIAGNOSIS — M7062 Trochanteric bursitis, left hip: Secondary | ICD-10-CM | POA: Diagnosis not present

## 2018-05-14 DIAGNOSIS — M7062 Trochanteric bursitis, left hip: Secondary | ICD-10-CM | POA: Diagnosis not present

## 2018-05-14 DIAGNOSIS — M5412 Radiculopathy, cervical region: Secondary | ICD-10-CM | POA: Diagnosis not present

## 2018-05-14 DIAGNOSIS — S46911D Strain of unspecified muscle, fascia and tendon at shoulder and upper arm level, right arm, subsequent encounter: Secondary | ICD-10-CM | POA: Diagnosis not present

## 2018-05-14 DIAGNOSIS — M6281 Muscle weakness (generalized): Secondary | ICD-10-CM | POA: Diagnosis not present

## 2018-05-21 ENCOUNTER — Telehealth: Payer: Self-pay

## 2018-05-21 NOTE — Telephone Encounter (Signed)
Pt. returned call, dropped call. Author called pt. back, no answer. OK for PEC to reschedule awv with Dr. Elease Hashimoto and annotate "virtual" in appointment notes if pt. Has access to computer with camera or smartphone. Email needs verification.

## 2018-05-21 NOTE — Telephone Encounter (Signed)
Author phoned pt. to offer virtual AWV with PCP. No answer, left detailed VM asking for return call to specify preference.

## 2018-06-02 ENCOUNTER — Ambulatory Visit: Payer: Medicare Other

## 2018-06-09 ENCOUNTER — Ambulatory Visit: Payer: Medicare Other

## 2018-06-10 ENCOUNTER — Ambulatory Visit (INDEPENDENT_AMBULATORY_CARE_PROVIDER_SITE_OTHER): Payer: Medicare Other | Admitting: Family Medicine

## 2018-06-10 ENCOUNTER — Other Ambulatory Visit: Payer: Self-pay

## 2018-06-10 DIAGNOSIS — Z0000 Encounter for general adult medical examination without abnormal findings: Secondary | ICD-10-CM

## 2018-06-10 NOTE — Progress Notes (Signed)
[name], female   DOB: 1945-10-15, 73 y.o.   MRN: [id number]  Virtual Visit via Video Note  I connected with Mia Peterson on 06/10/18 at  8:00 AM EDT by a video enabled telemedicine application and verified that I am speaking with the correct person using two identifiers.  Location patient: home Location provider:work or home office Persons participating in the virtual visit: patient, provider  I discussed the limitations of evaluation and management by telemedicine and the availability of in person appointments. The patient expressed understanding and agreed to proceed.   HPI: Patient seen for Medicare wellness visit.  Her chronic problems include history of TIA, GERD, osteopenia, degenerative arthritis.  She recently went on Fosamax-because of increased FRAX score  We reviewed her medications.  She is on Lipitor following her questionable TIA couple years ago but has not had any recent lipids done.  No recent chest pains, fever, cough, dyspnea, abdominal pain  1.  Risk factors based on Past Medical , Social, and Family history reviewed and as indicated above with no changes  2.  Limitations in physical activities None.  No recent falls. Exercises regularly.  She walks about 4 to 5 miles per day.  Also does recumbent bike 2 days/week and does some upper extremity weights.  Was going to the [facility] regularly.  Had 1 recent fall after she tripped couple weeks ago of record.  No balance issues.  3.  Depression/mood No active depression or anxiety issues PHQ-2 equals 0  4.  Hearing No defiits  5.  ADLs independent in all.  6.  Cognitive function (orientation to time and place, language, writing, speech,memory) no short or long term memory issues.  Language and judgement intact.  7.  Home Safety no issues  8.  Height, weight, and visual acuity.all stable.  Follows very healthy diet.  Her appetite is stable.  Weight stable.  9.  Counseling discussed exercise, advanced  directives.  10. Recommendation of preventive services.  She has had Zostavax but not shingles vaccine.  She needs tetanus booster.  Also needs hepatitis C antibody which she will plan to get with labs in the future  11. Labs based on risk factors needs follow-up lipid and hepatic panel along with hepatitis C antibody.  She will get back in for these labs when safe  12. Care Plan-as above  13. Other Providers-none  14. Written schedule of screening/prevention services discussed with patient.  -Continue with yearly flu vaccine -Pneumovax and Prevnar already given -Due for repeat colonoscopy 02/03/2022 -Repeat mammogram due 03/06/2020 -Needs hepatitis C antibody -Due for tetanus booster now -Had Zostavax 2012.  She will consider Shingrix vaccine soon  15.  Dense directives.  Patient has living will and medical power of attorney in place    ROS: See pertinent positives and negatives per HPI.  Past Medical History:  Diagnosis Date  . Arthritis    right knee and foot- OSteo   . Asthma    treated in 20's and 30's  . 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 Complication of anesthesia    very slow to awaken   . GERD (gastroesophageal reflux disease)    prn Prilosec  . Head injury, closed    10/26/15- years ago - 30ish  . Shortness of breath dyspnea    with exertion  . Vaso vagal episode    passed out 2 times, close to passing out 3 times  . Vertigo    was  treated with rehab March and April 2017, does exercises if she begins to have symptoms    Past Surgical History:  Procedure Laterality Date  . ABDOMINAL HYSTERECTOMY  1993   menorrhagia.  Marland Kitchen BREAST ENHANCEMENT SURGERY  1978  . COLONOSCOPY W/ POLYPECTOMY    . COLONOSCOPY WITH PROPOFOL N/A 02/03/2017   Procedure: COLONOSCOPY WITH PROPOFOL;  Surgeon: Carol Ada, MD;  [facility];  Service: Endoscopy;  Laterality: N/A;  . KNEE ARTHROSCOPY W/ MENISCAL REPAIR Right   . TOTAL KNEE ARTHROPLASTY Right 11/03/2015    Procedure: RIGHT TOTAL KNEE ARTHROPLASTY;  Surgeon: Dorna Leitz, MD;  Location: [place];  Service: Orthopedics;  Laterality: Right;  . TUBAL LIGATION  1972  . VARICOSE VEIN SURGERY  1978    Family History  Problem Relation Age of Onset  . Heart disease Mother 7  . Cancer Father 54       pancreatic    SOCIAL HX: Non-smoker.  Drinks about 6 glasses of wine per week.  She is married.  Retired   Current Outpatient Medications:  .  alendronate (FOSAMAX) 70 MG tablet, Take 1 tablet (70 mg total) by mouth every 7 (seven) days. Take with a full glass of water on an empty stomach., Disp: 12 tablet, Rfl: 3 .  aspirin EC 81 MG tablet, Take 81 mg by mouth daily., Disp: , Rfl:  .  atorvastatin (LIPITOR) 40 MG tablet, , Disp: , Rfl:  .  Calcium Carb-Cholecalciferol (CALCIUM 600 + D PO), Take 2 tablets by mouth daily., Disp: , Rfl:  .  cycloSPORINE (RESTASIS) 0.05 % ophthalmic emulsion, Place 1 drop into both eyes 2 (two) times daily. , Disp: , Rfl:  .  Flaxseed, Linseed, (FLAX SEED OIL) 1000 MG CAPS, Take 1,000 mg by mouth daily., Disp: , Rfl:  .  Ginger, Zingiber officinalis, (GINGER ROOT) 550 MG CAPS, Take 550 mg by mouth daily., Disp: , Rfl:  .  meloxicam (MOBIC) 15 MG tablet, Take 15 mg by mouth daily., Disp: , Rfl:  .  Multiple Vitamins-Minerals (ONE DAILY WOMENS 50+ PO), Take 1 tablet by mouth daily., Disp: , Rfl:  .  MYRBETRIQ 25 MG TB24 tablet, TAKE 1 TABLET DAILY, Disp: 90 tablet, Rfl: 3 .  omeprazole (PRILOSEC) 20 MG capsule, Take by mouth., Disp: , Rfl:  .  POTASSIUM PO, Take 1 tablet by mouth daily., Disp: , Rfl:  .  TURMERIC PO, Take 400 mg by mouth 2 (two) times daily. , Disp: , Rfl:   EXAM:  VITALS per patient if applicable:  GENERAL: alert, oriented, appears well and in no acute distress  HEENT: atraumatic, conjunttiva clear, no obvious abnormalities on inspection of external nose and ears  NECK: normal movements of the head and neck  LUNGS: on inspection no signs of  respiratory distress, breathing rate appears normal, no obvious gross SOB, gasping or wheezing  CV: no obvious cyanosis  MS: moves all visible extremities without noticeable abnormality  PSYCH/NEURO: pleasant and cooperative, no obvious depression or anxiety, speech and thought processing grossly intact  ASSESSMENT AND PLAN:  Discussed the following assessment and plan:  Medicare subsequent annual wellness visit -Needs hepatitis C antibody.  We also discussed Shingrix vaccine which she will consider at later time.  She needs tetanus booster. -Continue regular exercise habits -Needs follow-up lipid and hepatic panel when we can safely get her back into the office.     I discussed the assessment and treatment plan with the patient. The patient was provided an  opportunity to ask questions and all were answered. The patient agreed with the plan and demonstrated an understanding of the instructions.   The patient was advised to call back or seek an in-person evaluation if the symptoms worsen or if the condition fails to improve as anticipated.   Carolann Littler, MD

## 2018-07-14 DIAGNOSIS — L814 Other melanin hyperpigmentation: Secondary | ICD-10-CM | POA: Diagnosis not present

## 2018-07-14 DIAGNOSIS — L821 Other seborrheic keratosis: Secondary | ICD-10-CM | POA: Diagnosis not present

## 2018-07-14 DIAGNOSIS — L570 Actinic keratosis: Secondary | ICD-10-CM | POA: Diagnosis not present

## 2018-07-14 DIAGNOSIS — Z85828 Personal history of other malignant neoplasm of skin: Secondary | ICD-10-CM | POA: Diagnosis not present

## 2018-07-17 ENCOUNTER — Other Ambulatory Visit: Payer: Self-pay

## 2018-07-17 ENCOUNTER — Encounter: Payer: Self-pay | Admitting: Family Medicine

## 2018-07-17 ENCOUNTER — Ambulatory Visit (INDEPENDENT_AMBULATORY_CARE_PROVIDER_SITE_OTHER): Payer: Medicare Other | Admitting: Family Medicine

## 2018-07-17 VITALS — BP 126/74 | HR 69 | Temp 98.4°F | Ht 67.5 in | Wt 160.0 lb

## 2018-07-17 DIAGNOSIS — Z8673 Personal history of transient ischemic attack (TIA), and cerebral infarction without residual deficits: Secondary | ICD-10-CM

## 2018-07-17 DIAGNOSIS — Z23 Encounter for immunization: Secondary | ICD-10-CM | POA: Diagnosis not present

## 2018-07-17 DIAGNOSIS — H6121 Impacted cerumen, right ear: Secondary | ICD-10-CM

## 2018-07-17 DIAGNOSIS — E785 Hyperlipidemia, unspecified: Secondary | ICD-10-CM

## 2018-07-17 DIAGNOSIS — Z1159 Encounter for screening for other viral diseases: Secondary | ICD-10-CM

## 2018-07-17 LAB — HEPATIC FUNCTION PANEL
ALT: 22 U/L (ref 0–35)
AST: 32 U/L (ref 0–37)
Albumin: 4 g/dL (ref 3.5–5.2)
Alkaline Phosphatase: 61 U/L (ref 39–117)
Bilirubin, Direct: 0.1 mg/dL (ref 0.0–0.3)
Total Bilirubin: 0.6 mg/dL (ref 0.2–1.2)
Total Protein: 6.4 g/dL (ref 6.0–8.3)

## 2018-07-17 LAB — LIPID PANEL
Cholesterol: 144 mg/dL (ref 0–200)
HDL: 67.2 mg/dL (ref >39.00)
LDL Cholesterol: 66 mg/dL (ref 0–99)
NonHDL: 76.69
Total CHOL/HDL Ratio: 2
Triglycerides: 53 mg/dL (ref 0.0–149.0)
VLDL: 10.6 mg/dL (ref 0.0–40.0)

## 2018-07-17 NOTE — Addendum Note (Signed)
Addended by: Anibal Henderson on: 07/17/2018 10:39 AM   Modules accepted: Orders

## 2018-07-17 NOTE — Progress Notes (Signed)
Subjective:     [name], female   DOB: 07/16/1945, 73 y.o.   MRN: [id number]  HPI Patient is here for the following several items  She wears hearing aids but has had decreased hearing over the past several days right ear.  She thinks is maybe a cerumen impaction.  She has had similar issues in the past.  No ear pain.  No drainage.  No vertigo.  She has history of TIA couple years ago.  She takes Lipitor.  She is overdue for follow-up labs.  No side effects from Lipitor.  No myalgias.  No history of hepatitis C screening.  Relatively low risk overall.  Past Medical History:  Diagnosis Date  . Arthritis    right knee and foot- OSteo   . Asthma    treated in 20's and 30's  . 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 Complication of anesthesia    very slow to awaken   . GERD (gastroesophageal reflux disease)    prn Prilosec  . Head injury, closed    10/26/15- years ago - 30ish  . Shortness of breath dyspnea    with exertion  . Vaso vagal episode    passed out 2 times, close to passing out 3 times  . Vertigo    was treated with rehab March and April 2017, does exercises if she begins to have symptoms   Past Surgical History:  Procedure Laterality Date  . ABDOMINAL HYSTERECTOMY  1993   menorrhagia.  Marland Kitchen BREAST ENHANCEMENT SURGERY  1978  . COLONOSCOPY W/ POLYPECTOMY    . COLONOSCOPY WITH PROPOFOL N/A 02/03/2017   Procedure: COLONOSCOPY WITH PROPOFOL;  Surgeon: Carol Ada, MD;  [facility];  Service: Endoscopy;  Laterality: N/A;  . KNEE ARTHROSCOPY W/ MENISCAL REPAIR Right   . TOTAL KNEE ARTHROPLASTY Right 11/03/2015   Procedure: RIGHT TOTAL KNEE ARTHROPLASTY;  Surgeon: Dorna Leitz, MD;  Location: [place];  Service: Orthopedics;  Laterality: Right;  . TUBAL LIGATION  1972  . [place]    reports that she quit smoking about 28 years ago. Her smoking use included cigarettes. She has a 20.00 pack-year smoking history. She has quit using  smokeless tobacco. She reports current alcohol use of about 6.0 standard drinks of alcohol per week. She reports that she does not use drugs. family history includes Cancer (age of onset: 38) in her father; Heart disease (age of onset: 65) in her mother. Allergies  Allergen Reactions  . Other Rash    Derald Macleod cooling pads, severe itching  . Adhesive [Tape] Hives    Use paper tape only     Review of Systems  Constitutional: Negative for chills, fatigue and fever.  HENT: Positive for hearing loss. Negative for congestion and ear pain.   Eyes: Negative for visual disturbance.  Respiratory: Negative for cough, chest tightness, shortness of breath and wheezing.   Cardiovascular: Negative for chest pain, palpitations and leg swelling.  Endocrine: Negative for polydipsia and polyuria.  Genitourinary: Negative for dysuria.  Neurological: Negative for dizziness, seizures, syncope, weakness, light-headedness and headaches.       Objective:   Physical Exam Constitutional:      Appearance: Normal appearance.  HENT:     Ears:     Comments: Minimal cerumen left canal.  She has total impaction right canal Cardiovascular:     Rate and Rhythm: Normal rate and regular rhythm.  Pulmonary:     Effort:  Pulmonary effort is normal.     Breath sounds: Normal breath sounds.  Neurological:     Mental Status: She is alert.        Assessment:     #1 right ear cerumen impaction   #2 history of mild hyperlipidemia.  She has past history of TIA.  Goal LDL less than 70      Plan:     -Check lipids and hepatic panel.  Patient has also not had prior history of hepatitis C screening and this will be checked. -Irrigation of right ear canal  Eulas Post MD [facility] at [facility]

## 2018-07-21 LAB — HEPATITIS C ANTIBODY
Hepatitis C Ab: NONREACTIVE
SIGNAL TO CUT-OFF: 0.01 (ref <1.00)

## 2018-08-11 DIAGNOSIS — H2513 Age-related nuclear cataract, bilateral: Secondary | ICD-10-CM | POA: Diagnosis not present

## 2018-08-11 DIAGNOSIS — H5213 Myopia, bilateral: Secondary | ICD-10-CM | POA: Diagnosis not present

## 2018-08-18 ENCOUNTER — Encounter: Payer: Self-pay | Admitting: Family Medicine

## 2018-08-18 ENCOUNTER — Other Ambulatory Visit: Payer: Self-pay

## 2018-08-18 MED ORDER — ATORVASTATIN CALCIUM 40 MG PO TABS: 40.0000 mg | ORAL_TABLET | Freq: Every day | ORAL | 3 refills | Status: DC

## 2018-09-07 ENCOUNTER — Telehealth: Payer: Self-pay | Admitting: *Deleted

## 2018-09-07 DIAGNOSIS — Z20828 Contact with and (suspected) exposure to other viral communicable diseases: Secondary | ICD-10-CM | POA: Diagnosis not present

## 2018-09-07 NOTE — Telephone Encounter (Signed)
Patient called after hours line on 7.11.2020 . Patient verbalized she may have been exposed to covid 19. She is needing to schedule an appointment.  Clinic RN attempted to contact patient. No answer. LVM for patient to return call.

## 2018-09-08 NOTE — Telephone Encounter (Signed)
Called patient and she stated that she did get a Covid test from the health department and she stated that she has been around a friend that had symptoms of Covid and they are both waiting for the test results. Patient stated that she has cancelled plans for family get together this week and will stay distanced from public for the 14 day period to be on the safe side. Patient advised to let us know if she begins to have symptoms or if feeling bad. Patient verbalized an understanding.

## 2018-09-08 NOTE — Telephone Encounter (Signed)
Agree with advice given.  She should stayed quarantined until results back.

## 2018-09-16 ENCOUNTER — Encounter: Payer: Self-pay | Admitting: Family Medicine

## 2018-09-25 ENCOUNTER — Other Ambulatory Visit: Payer: Self-pay

## 2018-10-27 ENCOUNTER — Telehealth: Payer: Self-pay

## 2018-10-27 ENCOUNTER — Encounter: Payer: Self-pay | Admitting: Family Medicine

## 2018-10-27 NOTE — Telephone Encounter (Signed)
Called patient and left a detailed voice message for her to call to schedule an appointment with Dr. Elease Hashimoto, either [place] virtual visit, telephone, or in office.  OK for PEC to discuss/advise/schedule patient  CRM Created.

## 2018-10-29 ENCOUNTER — Ambulatory Visit (INDEPENDENT_AMBULATORY_CARE_PROVIDER_SITE_OTHER): Payer: Medicare Other

## 2018-10-29 ENCOUNTER — Other Ambulatory Visit: Payer: Self-pay

## 2018-10-29 DIAGNOSIS — Z23 Encounter for immunization: Secondary | ICD-10-CM | POA: Diagnosis not present

## 2018-10-30 ENCOUNTER — Encounter: Payer: Self-pay | Admitting: Family Medicine

## 2018-10-30 ENCOUNTER — Ambulatory Visit (INDEPENDENT_AMBULATORY_CARE_PROVIDER_SITE_OTHER): Payer: Medicare Other | Admitting: Family Medicine

## 2018-10-30 ENCOUNTER — Other Ambulatory Visit: Payer: Self-pay

## 2018-10-30 VITALS — BP 110/68 | HR 62 | Temp 98.0°F | Ht 67.5 in | Wt 150.7 lb

## 2018-10-30 DIAGNOSIS — I959 Hypotension, unspecified: Secondary | ICD-10-CM | POA: Diagnosis not present

## 2018-10-30 DIAGNOSIS — R5383 Other fatigue: Secondary | ICD-10-CM | POA: Diagnosis not present

## 2018-10-30 LAB — BASIC METABOLIC PANEL
BUN: 13 mg/dL (ref 6–23)
CO2: 29 mEq/L (ref 19–32)
Calcium: 9.1 mg/dL (ref 8.4–10.5)
Chloride: 100 mEq/L (ref 96–112)
Creatinine, Ser: 0.69 mg/dL (ref 0.40–1.20)
GFR: 83.27 mL/min (ref >60.00)
Glucose, Bld: 85 mg/dL (ref 70–99)
Potassium: 4.4 mEq/L (ref 3.5–5.1)
Sodium: 137 mEq/L (ref 135–145)

## 2018-10-30 LAB — CBC WITH DIFFERENTIAL/PLATELET
Basophils Absolute: 0 10*3/uL (ref 0.0–0.1)
Basophils Relative: 0.8 % (ref 0.0–3.0)
Eosinophils Absolute: 0.1 10*3/uL (ref 0.0–0.7)
Eosinophils Relative: 1.9 % (ref 0.0–5.0)
HCT: 39.7 % (ref 36.0–46.0)
Hemoglobin: 13.5 g/dL (ref 12.0–15.0)
Lymphocytes Relative: 21.9 % (ref 12.0–46.0)
Lymphs Abs: 1 10*3/uL (ref 0.7–4.0)
MCHC: 34 g/dL (ref 30.0–36.0)
MCV: 88.3 fl (ref 78.0–100.0)
Monocytes Absolute: 0.5 10*3/uL (ref 0.1–1.0)
Monocytes Relative: 11.6 % (ref 3.0–12.0)
Neutro Abs: 2.8 10*3/uL (ref 1.4–7.7)
Neutrophils Relative %: 63.8 % (ref 43.0–77.0)
Platelets: 278 10*3/uL (ref 150.0–400.0)
RBC: 4.5 Mil/uL (ref 3.87–5.11)
RDW: 14.2 % (ref 11.5–15.5)
WBC: 4.4 10*3/uL (ref 4.0–10.5)

## 2018-10-30 LAB — CORTISOL: Cortisol, Plasma: 8.3 ug/dL

## 2018-10-30 LAB — TSH: TSH: 1.59 u[IU]/mL (ref 0.35–4.50)

## 2018-10-30 NOTE — Progress Notes (Signed)
Subjective:     [name], female   DOB: [birth year], 73 y.o.   MRN: [id number]  HPI   Mia Peterson is seen with some concern for low blood pressure and intermittent fatigue.  She states that "some days are worse than others ".  She has had some blood pressure readings as low as 90/56.  She does have occasional dizziness when she first stands up but the symptoms are inconsistent.  No syncope.  She brings in her blood pressure cuff today.  No history of anemia.  She has lost about 10 pounds due to her efforts.  She is exercising regularly and no exercise intolerance.  No palpitations.  No chest pain.  Stays well-hydrated.  Does not restrict sodium intake. Swimming for exercise and no exercise intolerance.  Past Medical History:  Diagnosis Date  . Arthritis    right knee and foot- OSteo   . Asthma    treated in 20's and 30's  . 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 Complication of anesthesia    very slow to awaken   . GERD (gastroesophageal reflux disease)    prn Prilosec  . Head injury, closed    10/26/15- years ago - 30ish  . Shortness of breath dyspnea    with exertion  . Vaso vagal episode    passed out 2 times, close to passing out 3 times  . Vertigo    was treated with rehab March and April 2017, does exercises if she begins to have symptoms   Past Surgical History:  Procedure Laterality Date  . ABDOMINAL HYSTERECTOMY  1993   menorrhagia.  Marland Kitchen BREAST ENHANCEMENT SURGERY  1978  . COLONOSCOPY W/ POLYPECTOMY    . COLONOSCOPY WITH PROPOFOL N/A 02/03/2017   Procedure: COLONOSCOPY WITH PROPOFOL;  Surgeon: Carol Ada, MD;  [facility];  Service: Endoscopy;  Laterality: N/A;  . KNEE ARTHROSCOPY W/ MENISCAL REPAIR Right   . TOTAL KNEE ARTHROPLASTY Right 11/03/2015   Procedure: RIGHT TOTAL KNEE ARTHROPLASTY;  Surgeon: Dorna Leitz, MD;  Location: [place];  Service: Orthopedics;  Laterality: Right;  . TUBAL LIGATION  1972  . [place]    reports that she quit smoking about 28 years ago. Her smoking use included cigarettes. She has a 20.00 pack-year smoking history. She has quit using smokeless tobacco. She reports current alcohol use of about 6.0 standard drinks of alcohol per week. She reports that she does not use drugs. family history includes Cancer (age of onset: 21) in her father; Heart disease (age of onset: 55) in her mother. Allergies  Allergen Reactions  . Other Rash    Derald Macleod cooling pads, severe itching  . Adhesive [Tape] Hives    Use paper tape only     Review of Systems  Constitutional: Negative for appetite change, chills, fever and unexpected weight change.  Respiratory: Negative for cough and shortness of breath.   Cardiovascular: Negative for chest pain and leg swelling.  Gastrointestinal: Negative for abdominal pain.  Endocrine: Negative for cold intolerance and heat intolerance.  Neurological: Positive for dizziness and light-headedness. Negative for syncope.  Hematological: Negative for adenopathy.       Objective:   Physical Exam Constitutional:      Appearance: She is well-developed.  Eyes:     Pupils: Pupils are equal, round, and reactive to light.  Neck:     Musculoskeletal: Neck supple.     Thyroid: No thyromegaly.  Vascular: No JVD.  Cardiovascular:     Rate and Rhythm: Normal rate and regular rhythm.     Heart sounds: No gallop.   Pulmonary:     Effort: Pulmonary effort is normal. No respiratory distress.     Breath sounds: Normal breath sounds. No wheezing or rales.  Neurological:     Mental Status: She is alert.        Assessment:     Patient presents with concern for low blood pressure intermittently as well as fatigue issues.  She has lost some weight due to her efforts which could be contributing to her change in blood pressure.  She does not have orthostatic change today.  Seated blood pressure by me left arm 128/80 and standing 120/74    Plan:     -Check  further labs with basic metabolic panel, TSH, CBC, cortisol level -Stressed importance of good hydration which sounds like she is doing.  She will continue to be fairly liberal with sodium intake for now   Eulas Post MD [facility] at [facility]

## 2018-11-05 ENCOUNTER — Encounter: Payer: Self-pay | Admitting: Family Medicine

## 2018-11-10 ENCOUNTER — Telehealth (INDEPENDENT_AMBULATORY_CARE_PROVIDER_SITE_OTHER): Payer: Medicare Other | Admitting: Family Medicine

## 2018-11-10 ENCOUNTER — Other Ambulatory Visit: Payer: Self-pay

## 2018-11-10 DIAGNOSIS — I959 Hypotension, unspecified: Secondary | ICD-10-CM

## 2018-11-10 DIAGNOSIS — R42 Dizziness and giddiness: Secondary | ICD-10-CM | POA: Diagnosis not present

## 2018-11-10 DIAGNOSIS — R55 Syncope and collapse: Secondary | ICD-10-CM

## 2018-11-10 NOTE — Progress Notes (Signed)
Virtual Visit via Video Note  I connected with Chenea  on 11/10/18 at  5:40 PM EDT by a video enabled telemedicine application and verified that I am speaking with the correct person using two identifiers.  Location patient: home Location provider:work or home office Persons participating in the virtual visit: patient, provider  I discussed the limitations of evaluation and management by telemedicine and the availability of in person appointments. The patient expressed understanding and agreed to proceed.   HPI:  Acute visit for hypotension and dizziness: -symptoms started "awhile" ago, she reports she has had some intermittent symptoms for several years, but that they have worsened for at least several weeks -today reports she feels great, without any symptoms -symptoms include intermittent fatigue, dizziness, lightheadedness, low blood pressures -seen by PCP recently on 10/30/18 for the same (pt had orthostatic change at the visit per notes, had normal labs, increased sodium intake was advised) -she has had ongoing intermittent symptoms symptoms despite increasing her sodium intake -notices low blood pressures when feels the symptoms, had several days of symptoms intermittently over the weekend whenever she stood up and also had a dizzy spell while driving -she also had some fatigue over the weekend -today she is fine and feels like she is back to normal - today BP is 112/70 -she reports the office told her she had a follow up with Dr. Elease Hashimoto tomorrow, but then called her to say she did not have a follow up with Dr. Elease Hashimoto and scheduled her a visit with me today.  -on review of chart she has had low bp in the past down as low as 90s/40s -she has been working hard to lose weight recently and lost about 10-12 months in the last few months -no new medications in the last few months, no new supplements -no SOB, palpitations, syncope, HA, nausea,  swelling or CP with the episodes -she has  experienced syncope several times in the past remotely -she has always had low BP and reports she was diagnosed with vagal syncope in the passed -her mother had a heart attack in early 82s  ROS: See pertinent positives and negatives per HPI.  Past Medical History:  Diagnosis Date  . Arthritis    right knee and foot- OSteo   . Asthma    treated in 20's and 30's  . 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 Complication of anesthesia    very slow to awaken   . GERD (gastroesophageal reflux disease)    prn Prilosec  . Head injury, closed    10/26/15- years ago - 30ish  . Shortness of breath dyspnea    with exertion  . Vaso vagal episode    passed out 2 times, close to passing out 3 times  . Vertigo    was treated with rehab March and April 2017, does exercises if she begins to have symptoms    Past Surgical History:  Procedure Laterality Date  . ABDOMINAL HYSTERECTOMY  1993   menorrhagia.  Marland Kitchen BREAST ENHANCEMENT SURGERY  1978  . COLONOSCOPY W/ POLYPECTOMY    . COLONOSCOPY WITH PROPOFOL N/A 02/03/2017   Procedure: COLONOSCOPY WITH PROPOFOL;  Surgeon: Carol Ada, MD;  Location: [facility];  Service: Endoscopy;  Laterality: N/A;  . KNEE ARTHROSCOPY W/ MENISCAL REPAIR Right   . 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 TUBAL  LIGATION  1972  . VARICOSE VEIN SURGERY  1978    Family History  Problem Relation Age of Onset  . Heart disease Mother 22  . Cancer Father 79       pancreatic    SOCIAL HX: see hpi   Current Outpatient Medications:  .  alendronate (FOSAMAX) 70 MG tablet, Take 1 tablet (70 mg total) by mouth every 7 (seven) days. Take with a full glass of water on an empty stomach., Disp: 12 tablet, Rfl: 3 .  aspirin EC 81 MG tablet, Take 81 mg by mouth daily., Disp: , Rfl:  .  atorvastatin (LIPITOR) 40 MG tablet, Take 1 tablet (40 mg  total) by mouth daily., Disp: 90 tablet, Rfl: 3 .  Azelaic Acid 15 % cream, APPLY TO AFFECTED AREA EVERY DAY, Disp: , Rfl:  .  Calcium Carb-Cholecalciferol (CALCIUM 600 + D PO), Take 2 tablets by mouth daily., Disp: , Rfl:  .  cycloSPORINE (RESTASIS) 0.05 % ophthalmic emulsion, Place 1 drop into both eyes 2 (two) times daily. , Disp: , Rfl:  .  Flaxseed, Linseed, (FLAX SEED OIL) 1000 MG CAPS, Take 1,000 mg by mouth daily., Disp: , Rfl:  .  Ginger, Zingiber officinalis, (GINGER ROOT) 550 MG CAPS, Take 550 mg by mouth daily., Disp: , Rfl:  .  meloxicam (MOBIC) 15 MG tablet, Take 15 mg by mouth daily., Disp: , Rfl:  .  Multiple Vitamins-Minerals (ONE DAILY WOMENS 50+ PO), Take 1 tablet by mouth daily., Disp: , Rfl:  .  MYRBETRIQ 25 MG TB24 tablet, TAKE 1 TABLET DAILY, Disp: 90 tablet, Rfl: 3 .  omeprazole (PRILOSEC) 20 MG capsule, Take by mouth., Disp: , Rfl:  .  POTASSIUM PO, Take 1 tablet by mouth daily., Disp: , Rfl:  .  TURMERIC PO, Take 400 mg by mouth 2 (two) times daily. , Disp: , Rfl:   EXAM:  VITALS per patient if applicable: see hpi  GENERAL: alert, oriented, appears well and in no acute distress  HEENT: atraumatic, conjunttiva clear, no obvious abnormalities on inspection of external nose and ears  NECK: normal movements of the head and neck  LUNGS: on inspection no signs of respiratory distress, breathing rate appears normal, no obvious gross SOB, gasping or wheezing  CV: no obvious cyanosis  MS: moves all visible extremities without noticeable abnormality  PSYCH/NEURO: pleasant and cooperative, no obvious depression or anxiety, speech and thought processing grossly intact  ASSESSMENT AND PLAN:  Discussed the following assessment and plan: More than 50% of over 25 minutes spent in total in caring for this patient was spent face-to-face with the patient, counseling and/or coordinating care.    Hypotension, unspecified hypotension type  Dizziness  Postural dizziness  with presyncope  -very pleasant 73 yo with what sounds like long standing issues with orthostatic hypotension and a history of vagal syncope, with worsening intermittent dizziness and hypotension the last several weeks -we discussed possible serious and likely etiologies, options for evaluation and workup, limitations of telemedicine visit vs in person visit, treatment, treatment risks and precautions. Pt prefers to discuss via telemedicine today rather then scheduling in-person visit (which I advised/offered) at this moment. After a lengthy discussion of potential etiologies and potential options for further evaluation and management she would like for me to see what her PCP thinks in terms of the next best step. She is very troubled by the symptoms and now is nervous to drive but declines scheduling another in-person exam. I feel that she needs  a good neurological exam, EKG, ? Neuroimaging pending exam and testing for positional vertigo at minimum (which we can not do via a virtual visit). I also suggested a cardiology evaluation. She may as a last resort need florinef if does not improve and the blood pressure is confirmed to be the issue (which I suspect) and there are not other underlying etiologies found. Regardless, I explained that this is beyond the scope of a virtual visit to fully evaluate/manage and advised further in person evaluation. -I'll send a message to her PCP about our visit and get his thoughts regarding the next step per her request. She agrees not to drive in the interim and to seek prompt in person car if worsening or if new symptoms arise.   I discussed the assessment and treatment plan with the patient. The patient was provided an opportunity to ask questions and all were answered. The patient agreed with the plan and demonstrated an understanding of the instructions.   The patient was advised to call back or seek an in-person evaluation if the symptoms worsen or if the condition  fails to improve as anticipated.   Mia Kern, DO

## 2018-11-11 ENCOUNTER — Encounter: Payer: Self-pay | Admitting: Family Medicine

## 2018-11-11 NOTE — Progress Notes (Signed)
Thanks for the feedback.  I tried calling her earlier today.  I will be in touch with her to discuss.

## 2018-11-17 ENCOUNTER — Ambulatory Visit (INDEPENDENT_AMBULATORY_CARE_PROVIDER_SITE_OTHER): Payer: Medicare Other | Admitting: Family Medicine

## 2018-11-17 ENCOUNTER — Encounter: Payer: Self-pay | Admitting: Family Medicine

## 2018-11-17 ENCOUNTER — Other Ambulatory Visit: Payer: Self-pay

## 2018-11-17 VITALS — BP 122/72 | HR 75 | Temp 98.0°F | Ht 67.5 in | Wt 149.8 lb

## 2018-11-17 DIAGNOSIS — N3946 Mixed incontinence: Secondary | ICD-10-CM

## 2018-11-17 DIAGNOSIS — R6889 Other general symptoms and signs: Secondary | ICD-10-CM | POA: Diagnosis not present

## 2018-11-17 DIAGNOSIS — R42 Dizziness and giddiness: Secondary | ICD-10-CM | POA: Diagnosis not present

## 2018-11-17 DIAGNOSIS — Z8673 Personal history of transient ischemic attack (TIA), and cerebral infarction without residual deficits: Secondary | ICD-10-CM | POA: Diagnosis not present

## 2018-11-17 NOTE — Progress Notes (Signed)
Subjective:     [name], female   DOB: 09-08-1945, 73 y.o.   MRN: [id number]  HPI Mia Peterson is seen for follow-up regarding recent fatigue and dizziness episodes.  She states her symptoms are slightly better this week.  She has not noted any major drops in blood pressure since she was here for follow-up.  She did have episode yesterday where she was playing croquet with grandchildren felt lightheaded and had to stop.  No syncope.  Her blood pressure was "okay "when she checked it.  She has been hydrating well and more liberal with sodium intake without any improvement.  She has extreme fatigue symptoms.  She had recent labs including CBC, chemistries, TSH which were all normal.  She has not had any chest pain or dyspnea with exercise.  Does not take any new medications.  No focal weakness.  Never noted any associated palpitations or heart irregularity with episodes. She did have history of possible TIA couple years ago.  No echocardiogram apparently done at that time.  She has not noted any clear correlation with symptoms being worse after eating.  She also complains of frequent symptoms of urine urgency and some stress incontinence symptoms.  She tried Myrbetriq for urgency symptoms but that did not seem to help.  Would prefer to avoid anticholinergic medications with symptoms above  Past Medical History:  Diagnosis Date  . Arthritis    right knee and foot- OSteo   . Asthma    treated in 20's and 30's  . 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 Complication of anesthesia    very slow to awaken   . GERD (gastroesophageal reflux disease)    prn Prilosec  . Head injury, closed    10/26/15- years ago - 30ish  . Shortness of breath dyspnea    with exertion  . Vaso vagal episode    passed out 2 times, close to passing out 3 times  . Vertigo    was treated with rehab March and April 2017, does exercises if she begins to have symptoms   Past Surgical History:  Procedure  Laterality Date  . ABDOMINAL HYSTERECTOMY  1993   menorrhagia.  Marland Kitchen BREAST ENHANCEMENT SURGERY  1978  . COLONOSCOPY W/ POLYPECTOMY    . COLONOSCOPY WITH PROPOFOL N/A 02/03/2017   Procedure: COLONOSCOPY WITH PROPOFOL;  Surgeon: Carol Ada, MD;  Location: [facility];  Service: Endoscopy;  Laterality: N/A;  . KNEE ARTHROSCOPY W/ MENISCAL REPAIR Right   . 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 TUBAL LIGATION  1972  . [place]    reports that she quit smoking about 28 years ago. Her smoking use included cigarettes. She has a 20.00 pack-year smoking history. She has quit using smokeless tobacco. She reports current alcohol use of about 6.0 standard drinks of alcohol per week. She reports that she does not use drugs. family history includes Cancer (age of onset: 66) in her father; Heart disease (age of onset: 23) in her mother. Allergies  Allergen Reactions  . Other Rash    Derald Macleod cooling pads, severe itching  . Adhesive [Tape] Hives    Use paper tape only     Review of Systems  Constitutional: Positive for fatigue. Negative for appetite change, chills, fever and unexpected weight change.  Eyes: Negative for visual  disturbance.  Respiratory: Negative for cough, chest tightness, shortness of breath and wheezing.   Cardiovascular: Negative for chest pain, palpitations and leg swelling.  Gastrointestinal: Negative for abdominal pain.  Genitourinary: Negative for dysuria.  Neurological: Positive for dizziness and light-headedness. Negative for seizures, syncope, weakness and headaches.       Objective:   Physical Exam Constitutional:      Appearance: She is well-developed.  Eyes:     Pupils: Pupils are equal, round, and reactive to light.  Neck:     Musculoskeletal: Neck supple.     Thyroid: No thyromegaly.     Vascular: No  JVD.  Cardiovascular:     Rate and Rhythm: Normal rate and regular rhythm.     Heart sounds: No gallop.   Pulmonary:     Effort: Pulmonary effort is normal. No respiratory distress.     Breath sounds: Normal breath sounds. No wheezing or rales.  Neurological:     Mental Status: She is alert.        Assessment:     #1 Intermittent dizziness, lightheadedness, fatigue.  She does describe some exercise intolerance but no associated dyspnea or chest pain.  Recent labs unremarkable. Heart exam normal.   Had suspected possible autonomic dysfunction but we have not documented low blood pressure issues yet and no orthostatic change again today.  Doubt volume depletion as she has had good hydration with no improvement in symptoms.  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  She has not seen any improvement with Myrbetriq.  Would try to avoid anticholinergic medications with symptoms above Referral made to [facility]  -We discussed her nonspecific symptoms of dizziness.  Follow-up immediately for any syncope or worsening dizziness.  Stressed importance of adequate hydration and avoiding large meals.  Consider echocardiogram for further evaluation  Eulas Post MD [facility]

## 2018-11-17 NOTE — Patient Instructions (Signed)

## 2018-11-23 ENCOUNTER — Encounter: Payer: Self-pay | Admitting: Family Medicine

## 2018-12-23 ENCOUNTER — Telehealth (INDEPENDENT_AMBULATORY_CARE_PROVIDER_SITE_OTHER): Payer: Medicare Other | Admitting: Family Medicine

## 2018-12-23 ENCOUNTER — Other Ambulatory Visit: Payer: Self-pay

## 2018-12-23 DIAGNOSIS — R197 Diarrhea, unspecified: Secondary | ICD-10-CM

## 2018-12-23 NOTE — Progress Notes (Signed)
This visit type was conducted due to national recommendations for restrictions regarding the COVID-19 pandemic in an effort to limit this patient's exposure and mitigate transmission in our community.   Virtual Visit via Telephone Note  I connected with Mia Peterson on 12/23/18 at  3:30 PM EDT by telephone and verified that I am speaking with the correct person using two identifiers.   I discussed the limitations, risks, security and privacy concerns of performing an evaluation and management service by telephone and the availability of in person appointments. I also discussed with the patient that there may be a patient responsible charge related to this service. The patient expressed understanding and agreed to proceed.  Location patient: home Location provider: work or home office Participants present for the call: patient, provider Patient did not have a visit in the prior 7 days to address this/these issue(s).   History of Present Illness: Mia Peterson is seen by virtual visit with 7-day history of diarrhea.  No recent travels.  No recent antibiotics.  No change in medications.  She states she has about 3 or 4 mostly watery and sometimes loose stools early morning and then after lunch usually 1 or 2 and sometimes 1 or 2 later in the day.  No bloody stools.  No abdominal pain.  No fever.  No respiratory symptoms.  No history of gluten intolerance.  No history of lactose intolerance.  She had colonoscopy 12/18 which showed some sigmoid diverticulosis.  Keeping down fluids well.  She tried some Kaopectate on 3 different occasions without much improvement.  She has not tried any Imodium.   Observations/Objective: Patient sounds cheerful and well on the phone. I do not appreciate any SOB. Speech and thought processing are grossly intact. Patient reported vitals:  Assessment and Plan:  Diarrhea.  Does not sound toxic in any way and no risk factors for C. difficile.  We did mention the fact that  some folks are presenting with Covid with diarrhea symptoms predominantly but she has not had any known exposures and husband is asymptomatic -Discussed dietary factors- eg of fatty foods,etc -Stay well-hydrated -Consider over-the-counter Imodium -Touch base if symptoms not improving over the next couple days  Follow Up Instructions:  As above if not improved with Imodium.   99441 5-10 99442 11-20 99443 21-30 I did not refer this patient for an OV in the next 24 hours for this/these issue(s).  I discussed the assessment and treatment plan with the patient. The patient was provided an opportunity to ask questions and all were answered. The patient agreed with the plan and demonstrated an understanding of the instructions.   The patient was advised to call back or seek an in-person evaluation if the symptoms worsen or if the condition fails to improve as anticipated.  I provided 14 minutes of non-face-to-face time during this encounter.   Carolann Littler, MD

## 2019-01-18 ENCOUNTER — Encounter: Payer: Self-pay | Admitting: Family Medicine

## 2019-01-20 ENCOUNTER — Ambulatory Visit (INDEPENDENT_AMBULATORY_CARE_PROVIDER_SITE_OTHER): Payer: Medicare Other | Admitting: Family Medicine

## 2019-01-20 ENCOUNTER — Other Ambulatory Visit: Payer: Self-pay

## 2019-01-20 DIAGNOSIS — D225 Melanocytic nevi of trunk: Secondary | ICD-10-CM | POA: Diagnosis not present

## 2019-01-20 DIAGNOSIS — L821 Other seborrheic keratosis: Secondary | ICD-10-CM | POA: Diagnosis not present

## 2019-01-20 DIAGNOSIS — L700 Acne vulgaris: Secondary | ICD-10-CM | POA: Diagnosis not present

## 2019-01-20 DIAGNOSIS — L738 Other specified follicular disorders: Secondary | ICD-10-CM | POA: Diagnosis not present

## 2019-01-20 DIAGNOSIS — R42 Dizziness and giddiness: Secondary | ICD-10-CM | POA: Diagnosis not present

## 2019-01-20 DIAGNOSIS — L570 Actinic keratosis: Secondary | ICD-10-CM | POA: Diagnosis not present

## 2019-01-20 DIAGNOSIS — Z85828 Personal history of other malignant neoplasm of skin: Secondary | ICD-10-CM | POA: Diagnosis not present

## 2019-01-20 NOTE — Progress Notes (Signed)
This visit type was conducted due to national recommendations for restrictions regarding the COVID-19 pandemic in an effort to limit this patient's exposure and mitigate transmission in our community.   Virtual Visit via Telephone Note  I connected with Mia Peterson on 01/20/19 at  3:15 PM EST by telephone and verified that I am speaking with the correct person using two identifiers.   I discussed the limitations, risks, security and privacy concerns of performing an evaluation and management service by telephone and the availability of in person appointments. I also discussed with the patient that there may be a patient responsible charge related to this service. The patient expressed understanding and agreed to proceed.  Location patient: home Location provider: work or home office Participants present for the call: patient, provider Patient did not have a visit in the prior 7 days to address this/these issue(s).   History of Present Illness: Mia Peterson was going to be seen in the office but on further questioning to screening she has had some diarrhea for 1 day.  She has not had any fevers or chills or body aches or cough.  Her major issue and the reason she initiated this visit was 10-day history of vertigo.  This seems to be bidirectional.  She had similar episode about 4 years ago and went through vestibular rehab and symptoms eventually resolved.  She started doing Epley maneuvers herself several days ago but is not seeing improvement.    She had some leftover meclizine which did not help.  She denies any nausea or vomiting.  No dysphagia.  No diplopia.  No speech changes.  She is not aware of any nasal congestion but has some bilateral ear congestion.  No tinnitus.  No hearing loss.   No confusion.   Observations/Objective: Patient sounds cheerful and well on the phone. I do not appreciate any SOB. Speech and thought processing are grossly intact. Patient reported vitals:  Assessment and  Plan:  Persistent vertigo reported 10-day duration.  Not improved with Epley maneuvers.  By report not unidirectional  -We recommend she try some daily Flonase and cautious use of Sudafed and reviewed potential side effects-especially insomnia. -We discussed that medications such as meclizine are of limited usefulness in the setting of vertigo without nausea -We reviewed red flags of things to watch for with vertigo -We will touch base with her in 2 days.  If symptoms not improving consider referral back to vestibular rehab  Follow Up Instructions:  - as above.   99441 5-10 99442 11-20 99443 21-30 I did not refer this patient for an OV in the next 24 hours for this/these issue(s).  I discussed the assessment and treatment plan with the patient. The patient was provided an opportunity to ask questions and all were answered. The patient agreed with the plan and demonstrated an understanding of the instructions.   The patient was advised to call back or seek an in-person evaluation if the symptoms worsen or if the condition fails to improve as anticipated.  I provided 22 minutes of non-face-to-face time during this encounter.   Carolann Littler, MD

## 2019-01-25 ENCOUNTER — Encounter: Payer: Self-pay | Admitting: Family Medicine

## 2019-01-25 ENCOUNTER — Other Ambulatory Visit: Payer: Self-pay

## 2019-01-25 DIAGNOSIS — R42 Dizziness and giddiness: Secondary | ICD-10-CM

## 2019-01-27 DIAGNOSIS — N3946 Mixed incontinence: Secondary | ICD-10-CM | POA: Diagnosis not present

## 2019-01-27 DIAGNOSIS — R350 Frequency of micturition: Secondary | ICD-10-CM | POA: Diagnosis not present

## 2019-01-27 DIAGNOSIS — R351 Nocturia: Secondary | ICD-10-CM | POA: Diagnosis not present

## 2019-01-29 ENCOUNTER — Encounter: Payer: Self-pay | Admitting: Family Medicine

## 2019-02-04 ENCOUNTER — Other Ambulatory Visit: Payer: Self-pay

## 2019-02-04 ENCOUNTER — Ambulatory Visit (INDEPENDENT_AMBULATORY_CARE_PROVIDER_SITE_OTHER): Payer: Medicare Other | Admitting: Otolaryngology

## 2019-02-04 ENCOUNTER — Encounter (INDEPENDENT_AMBULATORY_CARE_PROVIDER_SITE_OTHER): Payer: Self-pay | Admitting: Otolaryngology

## 2019-02-04 VITALS — Temp 97.7°F

## 2019-02-04 DIAGNOSIS — R42 Dizziness and giddiness: Secondary | ICD-10-CM | POA: Diagnosis not present

## 2019-02-04 NOTE — Progress Notes (Signed)
HPI: Mia Peterson is a 73 y.o. female who presents for evaluation of chronic dizziness.  This apparently began on September 9th when she was first getting out of bed she had severe vertigo for about 15 to 20 minutes and then gradually recovered somewhat.  But she kept having bouts of dizziness and was tried on meclizine which made her very tired.  She was also tried on several maneuvers to help with chronic dizziness but she is continue to have chronic problems with her dizziness with difficulty walking as well as difficulty driving.  She feels chronically imbalanced and occasionally has symptoms of vertigo.  She has not noted any real change in her hearing.  Denies any headaches. She has had vertigo and dizzy episodes previously which have responded to exercises.  But she has had more chronic dizziness this time that has not responded to exercises or medication.  Past Medical History:  Diagnosis Date  . Arthritis    right knee and foot- OSteo   . Asthma    treated in 20's and 30's  . 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 Complication of anesthesia    very slow to awaken   . GERD (gastroesophageal reflux disease)    prn Prilosec  . Head injury, closed    10/26/15- years ago - 30ish  . Shortness of breath dyspnea    with exertion  . Vaso vagal episode    passed out 2 times, close to passing out 3 times  . Vertigo    was treated with rehab March and April 2017, does exercises if she begins to have symptoms   Past Surgical History:  Procedure Laterality Date  . ABDOMINAL HYSTERECTOMY  1993   menorrhagia.  Marland Kitchen BREAST ENHANCEMENT SURGERY  1978  . COLONOSCOPY W/ POLYPECTOMY    . COLONOSCOPY WITH PROPOFOL N/A 02/03/2017   Procedure: COLONOSCOPY WITH PROPOFOL;  Surgeon: Carol Ada, MD;  Location: [facility];  Service: Endoscopy;  Laterality: N/A;  . KNEE ARTHROSCOPY W/ MENISCAL REPAIR Right   . TOTAL KNEE ARTHROPLASTY Right 11/03/2015   Procedure: RIGHT TOTAL KNEE ARTHROPLASTY;   Surgeon: Dorna Leitz, MD;  Location: [facility];  Service: Orthopedics;  Laterality: Right;  . TUBAL LIGATION  1972  . VARICOSE VEIN SURGERY  1978   Social History   Socioeconomic History  . Marital status: Married    Spouse name: Not on file  . Number of children: Not on file  . Years of education: Not on file  . Highest education level: Not on file  Occupational History  . Not on file  Tobacco Use  . Smoking status: Former Smoker    Packs/day: 1.00    Years: 20.00    Pack years: 20.00    Types: Cigarettes    Start date: 47    Quit date: 03/24/1990    Years since quitting: 28.8  . Smokeless tobacco: Never Used  Substance and Sexual Activity  . Alcohol use: Yes    Alcohol/week: 6.0 standard drinks    Types: 6 Glasses of wine per week    Comment: ocasional use   . Drug use: No  . Sexual activity: Not on file  Other Topics Concern  . Not on file  Social History Narrative  . Not on file   Social Determinants of Health   Financial Resource Strain:   . Difficulty of Paying Living Expenses: Not on file  Food Insecurity:   . Worried About Charity fundraiser in  the Last Year: Not on file  . Ran Out of Food in the Last Year: Not on file  Transportation Needs:   . Lack of Transportation (Medical): Not on file  . Lack of Transportation (Non-Medical): Not on file  Physical Activity:   . Days of Exercise per Week: Not on file  . Minutes of Exercise per Session: Not on file  Stress:   . Feeling of Stress : Not on file  Social Connections:   . Frequency of Communication with Friends and Family: Not on file  . Frequency of Social Gatherings with Friends and Family: Not on file  . Attends Religious Services: Not on file  . Active Member of Clubs or Organizations: Not on file  . Attends Archivist Meetings: Not on file  . Marital Status: Not on file   Family History  Problem Relation Age of Onset  . Heart disease Mother 57  . Cancer Father 1       pancreatic    Allergies  Allergen Reactions  . Other Rash    Derald Macleod cooling pads, severe itching  . Adhesive [Tape] Hives    Use paper tape only   Prior to Admission medications   Medication Sig Start Date End Date Taking? Authorizing Provider  alendronate (FOSAMAX) 70 MG tablet Take 1 tablet (70 mg total) by mouth every 7 (seven) days. Take with a full glass of water on an empty stomach. 05/04/18  Yes Burchette, [name] Sierras, MD  aspirin EC 81 MG tablet Take 81 mg by mouth daily.   Yes [provider]  atorvastatin (LIPITOR) 40 MG tablet Take 1 tablet (40 mg total) by mouth daily. 08/18/18  Yes Burchette, [name] Sierras, MD  Azelaic Acid 15 % cream APPLY TO AFFECTED AREA EVERY DAY 06/15/18  Yes [provider]  Calcium Carb-Cholecalciferol (CALCIUM 600 + D PO) Take 2 tablets by mouth daily.   Yes [provider]  cycloSPORINE (RESTASIS) 0.05 % ophthalmic emulsion Place 1 drop into both eyes 2 (two) times daily.    Yes [provider]  Flaxseed, Linseed, (FLAX SEED OIL) 1000 MG CAPS Take 1,000 mg by mouth daily.   Yes [provider]  Ginger, Zingiber officinalis, (GINGER ROOT) 550 MG CAPS Take 550 mg by mouth daily.   Yes [provider]  meloxicam (MOBIC) 15 MG tablet Take 15 mg by mouth daily. 04/07/18  Yes [provider]  Multiple Vitamins-Minerals (ONE DAILY WOMENS 50+ PO) Take 1 tablet by mouth daily.   Yes [provider]  MYRBETRIQ 25 MG TB24 tablet TAKE 1 TABLET DAILY 04/21/18  Yes Burchette, [name] Sierras, MD  omeprazole (PRILOSEC) 20 MG capsule Take by mouth.   Yes [provider]  POTASSIUM PO Take 1 tablet by mouth daily.   Yes [provider]  TURMERIC PO Take 400 mg by mouth 2 (two) times daily.    Yes [provider]     Positive ROS: Otherwise negative  All other systems have been reviewed and were otherwise negative with the exception of those mentioned in the HPI and as above.  Physical  Exam: Constitutional: Alert, well-appearing, no acute distress.  Slight imbalance.  No obvious nystagmus.  No vertigo presently. Ears: External ears without lesions or tenderness. Ear canals are clear bilaterally with intact, clear TMs.  On Dix-Hallpike testing she perhaps had minimal rotational nystagmus but no obvious gross nystagmus with the head turned to the right or the left. Nasal: External nose without  lesions. Septum relatively midline.. Clear nasal passages Oral: Lips and gums without lesions. Tongue and palate mucosa without lesions. Posterior oropharynx clear. Neck: No palpable adenopathy or masses.  No carotid bruits. Respiratory: Breathing comfortably  Skin: No facial/neck lesions or rash noted.  Procedures  Assessment: Dizziness and history of vertigo  Plan: We will plan on scheduling patient for VNG testing as well as audiogram and have her follow-up following the above tests.  Radene Journey, MD

## 2019-02-15 ENCOUNTER — Other Ambulatory Visit: Payer: Self-pay

## 2019-02-15 ENCOUNTER — Ambulatory Visit (INDEPENDENT_AMBULATORY_CARE_PROVIDER_SITE_OTHER): Payer: Medicare Other | Admitting: Otolaryngology

## 2019-02-15 ENCOUNTER — Encounter (INDEPENDENT_AMBULATORY_CARE_PROVIDER_SITE_OTHER): Payer: Self-pay | Admitting: Otolaryngology

## 2019-02-15 VITALS — Temp 97.5°F

## 2019-02-15 DIAGNOSIS — R42 Dizziness and giddiness: Secondary | ICD-10-CM

## 2019-02-15 NOTE — Progress Notes (Signed)
HPI: Mia Peterson is a 73 y.o. female who returns today for evaluation of dizziness.  She recently underwent VNG and audiologic testing.  Overall her dizziness is doing better.  She still has episodes of dizziness but does not really describe vertigo and nausea.  More of imbalance.  She also recently noticed abrupt decreased hearing in her right ear that is doing better today.  I reviewed the audiogram and VNG testing with her today.  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  VNG testing demonstrated normal VNG testing bilaterally.  She had type a tympanograms bilaterally..  Past Medical History:  Diagnosis Date  . Arthritis    right knee and foot- OSteo   . Asthma    treated in 20's and 30's  . 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 Complication of anesthesia    very slow to awaken   . GERD (gastroesophageal reflux disease)    prn Prilosec  . Head injury, closed    10/26/15- years ago - 30ish  . Shortness of breath dyspnea    with exertion  . Vaso vagal episode    passed out 2 times, close to passing out 3 times  . Vertigo    was treated with rehab March and April 2017, does exercises if she begins to have symptoms   Past Surgical History:  Procedure Laterality Date  . ABDOMINAL HYSTERECTOMY  1993   menorrhagia.  Marland Kitchen BREAST ENHANCEMENT SURGERY  1978  . COLONOSCOPY W/ POLYPECTOMY    . COLONOSCOPY WITH PROPOFOL N/A 02/03/2017   Procedure: COLONOSCOPY WITH PROPOFOL;  Surgeon: Carol Ada, MD;  [facility];  Service: Endoscopy;  Laterality: N/A;  . KNEE ARTHROSCOPY W/ MENISCAL REPAIR Right   . TOTAL KNEE ARTHROPLASTY Right 11/03/2015   Procedure: RIGHT TOTAL KNEE ARTHROPLASTY;  Surgeon: Dorna Leitz, MD;  Location: [facility];  Service: Orthopedics;  Laterality: Right;  . TUBAL LIGATION  1972  . VARICOSE VEIN SURGERY  1978   Social History   Socioeconomic History  . Marital status:  Married    Spouse name: Not on file  . Number of children: Not on file  . Years of education: Not on file  . Highest education level: Not on file  Occupational History  . Not on file  Tobacco Use  . Smoking status: Former Smoker    Packs/day: 1.00    Years: 20.00    Pack years: 20.00    Types: Cigarettes    Start date: 47    Quit date: 03/24/1990    Years since quitting: 28.9  . Smokeless tobacco: Never Used  Substance and Sexual Activity  . Alcohol use: Yes    Alcohol/week: 6.0 standard drinks    Types: 6 Glasses of wine per week    Comment: ocasional use   . Drug use: No  . Sexual activity: Not on file  Other Topics Concern  . Not on file  Social History Narrative  . Not on file   Social Determinants of Health   Financial Resource Strain:   . Difficulty of Paying Living Expenses: Not on file  Food Insecurity:   . Worried About Charity fundraiser in the Last Year: Not on file  . Ran Out of Food in the Last Year: Not on file  Transportation Needs:   . Lack of Transportation (Medical): Not on file  . Lack of Transportation (Non-Medical): Not on file  Physical Activity:   . Days of Exercise per Week: Not on file  . Minutes of Exercise per Session: Not on file  Stress:   . Feeling of Stress : Not on file  Social Connections:   . Frequency of Communication with Friends and Family: Not on file  . Frequency of Social Gatherings with Friends and Family: Not on file  . Attends Religious Services: Not on file  . Active Member of Clubs or Organizations: Not on file  . Attends Archivist Meetings: Not on file  . Marital Status: Not on file   Family History  Problem Relation Age of Onset  . Heart disease Mother 69  . Cancer Father 47       pancreatic   Allergies  Allergen Reactions  . Other Rash    Derald Macleod cooling pads, severe itching  . Adhesive [Tape] Hives    Use paper tape only   Prior to Admission medications   Medication Sig Start Date End Date  Taking? Authorizing Provider  alendronate (FOSAMAX) 70 MG tablet Take 1 tablet (70 mg total) by mouth every 7 (seven) days. Take with a full glass of water on an empty stomach. 05/04/18  Yes Burchette, [name] Sierras, MD  aspirin EC 81 MG tablet Take 81 mg by mouth daily.   Yes [provider]  atorvastatin (LIPITOR) 40 MG tablet Take 1 tablet (40 mg total) by mouth daily. 08/18/18  Yes Burchette, [name] Sierras, MD  Azelaic Acid 15 % cream APPLY TO AFFECTED AREA EVERY DAY 06/15/18  Yes [provider]  Calcium Carb-Cholecalciferol (CALCIUM 600 + D PO) Take 2 tablets by mouth daily.   Yes [provider]  cycloSPORINE (RESTASIS) 0.05 % ophthalmic emulsion Place 1 drop into both eyes 2 (two) times daily.    Yes [provider]  Flaxseed, Linseed, (FLAX SEED OIL) 1000 MG CAPS Take 1,000 mg by mouth daily.   Yes [provider]  Ginger, Zingiber officinalis, (GINGER ROOT) 550 MG CAPS Take 550 mg by mouth daily.   Yes [provider]  meloxicam (MOBIC) 15 MG tablet Take 15 mg by mouth daily. 04/07/18  Yes [provider]  Multiple Vitamins-Minerals (ONE DAILY WOMENS 50+ PO) Take 1 tablet by mouth daily.   Yes [provider]  MYRBETRIQ 25 MG TB24 tablet TAKE 1 TABLET DAILY 04/21/18  Yes Burchette, [name] Sierras, MD  omeprazole (PRILOSEC) 20 MG capsule Take by mouth.   Yes [provider]  POTASSIUM PO Take 1 tablet by mouth daily.   Yes [provider]  TURMERIC PO Take 400 mg by mouth 2 (two) times daily.    Yes [provider]     Positive ROS: Otherwise negative  All other systems have been reviewed and were otherwise negative with the exception of those mentioned in the HPI and as above.  Physical Exam: Constitutional: Alert, well-appearing, no acute distress Ears: External ears without lesions or tenderness. Ear canals are clear bilaterally with intact, clear TMs.  Nasal: External nose without lesions. Septum straight.  Clear nasal passages Oral: Lips and gums without lesions. Tongue and palate mucosa without lesions. Posterior oropharynx clear. Neck: No palpable adenopathy or masses Respiratory: Breathing comfortably  Skin: No facial/neck lesions or rash noted.  Procedures  Assessment: Dizziness questionable etiology.  Normal vestibular function on VNG.  Symmetric hearing on audiologic testing.  Plan: Recommended increasing her exercise regimen including swimming.  If dizziness worsens would recommend further evaluation with neurology.  Radene Journey, MD

## 2019-02-16 ENCOUNTER — Encounter: Payer: Self-pay | Admitting: Family Medicine

## 2019-02-16 ENCOUNTER — Encounter (INDEPENDENT_AMBULATORY_CARE_PROVIDER_SITE_OTHER): Payer: Self-pay

## 2019-02-16 DIAGNOSIS — R42 Dizziness and giddiness: Secondary | ICD-10-CM

## 2019-02-16 NOTE — Telephone Encounter (Signed)
Let her know I have placed neurology referral.

## 2019-03-03 ENCOUNTER — Encounter: Payer: Self-pay | Admitting: Family Medicine

## 2019-03-18 DIAGNOSIS — Z1231 Encounter for screening mammogram for malignant neoplasm of breast: Secondary | ICD-10-CM | POA: Diagnosis not present

## 2019-03-18 LAB — HM MAMMOGRAPHY

## 2019-03-26 DIAGNOSIS — N3946 Mixed incontinence: Secondary | ICD-10-CM | POA: Diagnosis not present

## 2019-03-26 DIAGNOSIS — R350 Frequency of micturition: Secondary | ICD-10-CM | POA: Diagnosis not present

## 2019-03-26 DIAGNOSIS — R351 Nocturia: Secondary | ICD-10-CM | POA: Diagnosis not present

## 2019-03-30 ENCOUNTER — Other Ambulatory Visit: Payer: Self-pay | Admitting: Family Medicine

## 2019-04-01 ENCOUNTER — Telehealth: Payer: Self-pay | Admitting: Family Medicine

## 2019-04-01 ENCOUNTER — Encounter: Payer: Self-pay | Admitting: Neurology

## 2019-04-01 ENCOUNTER — Ambulatory Visit (INDEPENDENT_AMBULATORY_CARE_PROVIDER_SITE_OTHER): Payer: Medicare Other | Admitting: Neurology

## 2019-04-01 ENCOUNTER — Other Ambulatory Visit: Payer: Self-pay

## 2019-04-01 VITALS — BP 139/84 | HR 58 | Temp 96.6°F | Ht 67.0 in | Wt 151.8 lb

## 2019-04-01 DIAGNOSIS — N3946 Mixed incontinence: Secondary | ICD-10-CM | POA: Diagnosis not present

## 2019-04-01 DIAGNOSIS — R55 Syncope and collapse: Secondary | ICD-10-CM

## 2019-04-01 DIAGNOSIS — R350 Frequency of micturition: Secondary | ICD-10-CM | POA: Diagnosis not present

## 2019-04-01 NOTE — Telephone Encounter (Signed)
Spoke with patient and she will call [facility] to update her information.

## 2019-04-01 NOTE — Telephone Encounter (Signed)
Mia Peterson with [name] is needing all allergy information related to pt. The reference is [id number]. Thanks  [name]'s # [id number]

## 2019-04-01 NOTE — Progress Notes (Signed)
Reason for visit: Dizziness, syncope  Referring physician: Dr. Barron Schmid is a 74 y.o. female  History of present illness:  Mia Peterson is a 74 year old left-handed white female with a history of episodes of dizziness in the past.  The patient claims that she had onset of problems with dizziness in mid September 2020.  The patient woke up to go to the bathroom and felt somewhat staggery and off balance.  The episode lasted only a few moments and then dissipated.  Since that time, she was having daily episodes until December 2020 where she would have been dizziness that would be worse with standing, but may be persistent as well with sitting or even lying down.  The patient has documented on multiple occasions that her blood pressures are low during the symptomatic timeframes, she has noted blood pressures as low as 70/50.  On one occasion, she had developed severe stomach cramps, she went to the bathroom and then blacked out.  She had several weeks of significant diarrhea, but this has resolved.  The patient is eating and drinking well, she is staying hydrated.  She denies palpitations of the heart or chest pain or shortness of breath.  She denies headaches or vision changes.  She was seen through Dr. Lucia Gaskins from ENT, audiometry and VNG testing were unremarkable.  The patient did have an event in 2018 with transient aphasia, a full stroke work-up at that time was unremarkable at [facility].  2D echocardiogram, carotid Doppler study, and MRI of the brain did not show any significant abnormalities.  The patient does have some cortical atrophy and mild small vessel disease.  The patient reports no focal numbness of the face, arms, or legs.  She denies any weakness, she has had chronic issues with bladder control in the past.  The patient has had 3 episodes lifetime of transient episodes of dizziness, the first was in the 1990s.  The patient had true vertigo about 4 years ago, but she thinks  that this episode was quite different from the current episode.  She is having some problems with word finding and naming difficulty that has been present since 2000, but has worsened over the last 2 years.  She is sent to this office for an evaluation.  Past Medical History:  Diagnosis Date  . Arthritis    right knee and foot- OSteo   . Asthma    treated in 20's and 30's  . 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 Complication of anesthesia    very slow to awaken   . GERD (gastroesophageal reflux disease)    prn Prilosec  . Head injury, closed    10/26/15- years ago - 30ish  . Shortness of breath dyspnea    with exertion  . Vaso vagal episode    passed out 2 times, close to passing out 3 times  . Vertigo    was treated with rehab March and April 2017, does exercises if she begins to have symptoms    Past Surgical History:  Procedure Laterality Date  . ABDOMINAL HYSTERECTOMY  1993   menorrhagia.  Marland Kitchen BREAST ENHANCEMENT SURGERY  1978  . COLONOSCOPY W/ POLYPECTOMY    . COLONOSCOPY WITH PROPOFOL N/A 02/03/2017   Procedure: COLONOSCOPY WITH PROPOFOL;  Surgeon: Carol Ada, MD;  [facility];  Service: Endoscopy;  Laterality: N/A;  . KNEE ARTHROSCOPY W/ MENISCAL REPAIR Right   . right knee replaceent     .  TOTAL KNEE ARTHROPLASTY Right 11/03/2015   Procedure: RIGHT TOTAL KNEE ARTHROPLASTY;  Surgeon: Dorna Leitz, MD;  Location: [facility];  Service: Orthopedics;  Laterality: Right;  . TUBAL LIGATION  1972  . VARICOSE VEIN SURGERY  1978    Family History  Problem Relation Age of Onset  . Heart disease Mother 32  . Cancer Father 76       pancreatic    Social history:  reports that she quit smoking about 29 years ago. Her smoking use included cigarettes. She started smoking about 49 years ago. She has a 20.00 pack-year smoking history. She has never used smokeless tobacco. She reports current alcohol use of about 6.0 standard drinks of alcohol per week. She reports that she  does not use drugs.  Medications:  Prior to Admission medications   Medication Sig Start Date End Date Taking? Authorizing Provider  alendronate (FOSAMAX) 70 MG tablet TAKE 1 TABLET EVERY 7 DAYS. TAKE WITH A FULL GLASS OF WATER ON AN EMPTY STOMACH. 03/30/19  Yes Burchette, [name] Sierras, MD  aspirin EC 81 MG tablet Take 81 mg by mouth daily.   Yes [provider]  atorvastatin (LIPITOR) 40 MG tablet Take 1 tablet (40 mg total) by mouth daily. 08/18/18  Yes Burchette, [name] Sierras, MD  Azelaic Acid 15 % cream APPLY TO AFFECTED AREA EVERY DAY 06/15/18  Yes [provider]  Calcium Carb-Cholecalciferol (CALCIUM 600 + D PO) Take 2 tablets by mouth daily.   Yes [provider]  calcium-vitamin D (OSCAL WITH D) 500-200 MG-UNIT TABS tablet Take by mouth.   Yes [provider]  cycloSPORINE (RESTASIS) 0.05 % ophthalmic emulsion Place 1 drop into both eyes 2 (two) times daily.    Yes [provider]  Flaxseed, Linseed, (FLAX SEED OIL) 1000 MG CAPS Take 1,000 mg by mouth daily.   Yes [provider]  Ginger, Zingiber officinalis, (GINGER ROOT) 550 MG CAPS Take 550 mg by mouth daily.   Yes [provider]  meloxicam (MOBIC) 15 MG tablet Take 15 mg by mouth daily. 04/07/18  Yes [provider]  Multiple Vitamins-Minerals (ONE DAILY WOMENS 50+ PO) Take 1 tablet by mouth daily.   Yes [provider]  MYRBETRIQ 25 MG TB24 tablet TAKE 1 TABLET DAILY 04/21/18  Yes Burchette, [name] Sierras, MD  omeprazole (PRILOSEC) 20 MG capsule Take by mouth.   Yes [provider]  POTASSIUM PO Take 1 tablet by mouth daily.   Yes [provider]  TURMERIC PO Take 400 mg by mouth 2 (two) times daily.    Yes [provider]      Allergies  Allergen Reactions  . Other Rash    Derald Macleod cooling pads, severe itching  . Adhesive [Tape] Hives    Use paper tape only    ROS:  Out of a complete 14 system review of symptoms, the patient  complains only of the following symptoms, and all other reviewed systems are negative.  Dizziness Syncope Memory disturbance  Blood pressure 139/84, pulse (!) 58, temperature (!) 96.6 F (35.9 C), height 5\' 7"  (1.702 m), weight 151 lb 12.8 oz (68.9 kg).   Blood pressure, right arm, sitting is 158/84.  Blood pressure, right arm, standing is 132/84.  Physical Exam  General: The patient is alert and cooperative at the time of the examination.  Eyes: Pupils are equal, round, and reactive to light. Discs are flat bilaterally.  Neck: The neck is supple, no carotid bruits are noted.  Respiratory: The respiratory examination is clear.  Cardiovascular: The cardiovascular examination reveals a regular rate and rhythm, no obvious murmurs or rubs are noted.  Skin: Extremities are without significant edema.  Neurologic Exam  Mental status: The patient is alert and oriented x 3 at the time of the examination. The patient has apparent normal recent and remote memory, with an apparently normal attention span and concentration ability.  Cranial nerves: Facial symmetry is present. There is good sensation of the face to pinprick and soft touch bilaterally. The strength of the facial muscles and the muscles to head turning and shoulder shrug are normal bilaterally. Speech is well enunciated, no aphasia or dysarthria is noted. Extraocular movements are full. Visual fields are full. The tongue is midline, and the patient has symmetric elevation of the soft palate. No obvious hearing deficits are noted.  Motor: The motor testing reveals 5 over 5 strength of all 4 extremities. Good symmetric motor tone is noted throughout.  Sensory: Sensory testing is intact to pinprick, soft touch, vibration sensation, and position sense on all 4 extremities. No evidence of extinction is noted.  Coordination: Cerebellar testing reveals good finger-nose-finger and heel-to-shin bilaterally.  Gait and station: Gait is  normal. Tandem gait is normal. Romberg is negative. No drift is seen.  Reflexes: Deep tendon reflexes are symmetric and normal bilaterally. Toes are downgoing bilaterally.   Assessment/Plan:  1.  Episodic dizziness, hypotension  The patient has documented at home episodes of significant hypotension associated with symptomatic events.  The patient has had at least one episode of syncope that appears to be associated with abdominal cramps, likely associated with vasovagal syncope.  The reason for blood pressure instability is not clear, if anything the patient is slightly hypertensive today.  The episodes of dizziness have essentially gone away since December 2020.  The patient will be set up for MRI of the brain with MRA of the head.  I will contact her when I get the results.  In the past, she has been treated with Effexor for the episodes of dizziness which seem to offer benefit.  Effexor may elevate blood pressures, and may be reasonable to try if she has another episode of low blood pressures and dizziness.   Jill Alexanders MD 04/01/2019 11:43 AM  [facility] [address] [place], [place] [address]  Phone [phone] Fax [phone]

## 2019-04-10 ENCOUNTER — Encounter: Payer: Self-pay | Admitting: Family Medicine

## 2019-04-13 ENCOUNTER — Telehealth: Payer: Self-pay | Admitting: Neurology

## 2019-04-13 NOTE — Telephone Encounter (Signed)
Medicare is primary BC[employer] is secondary. I called [employer] for a PA I spoke to [employer] and she informed me she could not pull her up in the system. Order sent to GI they will reach out to the patient to schedule.

## 2019-04-21 ENCOUNTER — Other Ambulatory Visit: Payer: Self-pay | Admitting: Neurology

## 2019-05-04 ENCOUNTER — Ambulatory Visit
Admission: RE | Admit: 2019-05-04 | Discharge: 2019-05-04 | Disposition: A | Discharge status: Home / Self Care | Payer: Medicare Other | Source: Ambulatory Visit | Attending: Neurology | Admitting: Neurology

## 2019-05-04 DIAGNOSIS — R55 Syncope and collapse: Secondary | ICD-10-CM

## 2019-05-07 ENCOUNTER — Telehealth: Payer: Self-pay | Admitting: Neurology

## 2019-05-07 NOTE — Telephone Encounter (Signed)
I tried to contact patient, there was a blocking program on her cell phone, unable to leave a message or talk to the patient.  We will contact her next week through the office telephone.  The patient called right back, I discussed the results of the MRI with her, she is having some minimal issues with dizziness at this point, but overall feels much better, we will follow at this point.   MRI brain 05/06/19:  IMPRESSION:   MRI brain (without) demonstrating: - Mild chronic small vessel ischemic disease.  - No acute findings.   MRA head 05/06/19:   IMPRESSION:   Normal MRA head (without).

## 2019-05-12 DIAGNOSIS — N3946 Mixed incontinence: Secondary | ICD-10-CM | POA: Diagnosis not present

## 2019-05-12 DIAGNOSIS — R350 Frequency of micturition: Secondary | ICD-10-CM | POA: Diagnosis not present

## 2019-05-27 ENCOUNTER — Encounter: Payer: Self-pay | Admitting: Family Medicine

## 2019-05-27 DIAGNOSIS — Z780 Asymptomatic menopausal state: Secondary | ICD-10-CM

## 2019-06-06 DIAGNOSIS — Z20828 Contact with and (suspected) exposure to other viral communicable diseases: Secondary | ICD-10-CM | POA: Diagnosis not present

## 2019-07-05 DIAGNOSIS — D485 Neoplasm of uncertain behavior of skin: Secondary | ICD-10-CM | POA: Diagnosis not present

## 2019-07-05 DIAGNOSIS — C44622 Squamous cell carcinoma of skin of right upper limb, including shoulder: Secondary | ICD-10-CM | POA: Diagnosis not present

## 2019-07-05 DIAGNOSIS — Z85828 Personal history of other malignant neoplasm of skin: Secondary | ICD-10-CM | POA: Diagnosis not present

## 2019-07-05 DIAGNOSIS — L570 Actinic keratosis: Secondary | ICD-10-CM | POA: Diagnosis not present

## 2019-07-20 DIAGNOSIS — L814 Other melanin hyperpigmentation: Secondary | ICD-10-CM | POA: Diagnosis not present

## 2019-07-20 DIAGNOSIS — L820 Inflamed seborrheic keratosis: Secondary | ICD-10-CM | POA: Diagnosis not present

## 2019-07-20 DIAGNOSIS — Z85828 Personal history of other malignant neoplasm of skin: Secondary | ICD-10-CM | POA: Diagnosis not present

## 2019-07-20 DIAGNOSIS — D692 Other nonthrombocytopenic purpura: Secondary | ICD-10-CM | POA: Diagnosis not present

## 2019-07-20 DIAGNOSIS — D485 Neoplasm of uncertain behavior of skin: Secondary | ICD-10-CM | POA: Diagnosis not present

## 2019-07-20 DIAGNOSIS — L859 Epidermal thickening, unspecified: Secondary | ICD-10-CM | POA: Diagnosis not present

## 2019-07-20 DIAGNOSIS — L821 Other seborrheic keratosis: Secondary | ICD-10-CM | POA: Diagnosis not present

## 2019-08-13 DIAGNOSIS — H5213 Myopia, bilateral: Secondary | ICD-10-CM | POA: Diagnosis not present

## 2019-08-13 DIAGNOSIS — H2513 Age-related nuclear cataract, bilateral: Secondary | ICD-10-CM | POA: Diagnosis not present

## 2019-09-02 ENCOUNTER — Ambulatory Visit (INDEPENDENT_AMBULATORY_CARE_PROVIDER_SITE_OTHER)
Admission: RE | Admit: 2019-09-02 | Discharge: 2019-09-02 | Disposition: A | Discharge status: Home / Self Care | Payer: Medicare Other | Source: Ambulatory Visit | Attending: Family Medicine | Admitting: Family Medicine

## 2019-09-02 ENCOUNTER — Other Ambulatory Visit: Payer: Self-pay

## 2019-09-02 DIAGNOSIS — Z780 Asymptomatic menopausal state: Secondary | ICD-10-CM

## 2019-09-13 ENCOUNTER — Other Ambulatory Visit: Payer: Self-pay | Admitting: Family Medicine

## 2019-09-20 ENCOUNTER — Encounter: Payer: Self-pay | Admitting: Family Medicine

## 2019-10-27 DIAGNOSIS — L570 Actinic keratosis: Secondary | ICD-10-CM | POA: Diagnosis not present

## 2019-10-27 DIAGNOSIS — Z85828 Personal history of other malignant neoplasm of skin: Secondary | ICD-10-CM | POA: Diagnosis not present

## 2019-10-27 DIAGNOSIS — L820 Inflamed seborrheic keratosis: Secondary | ICD-10-CM | POA: Diagnosis not present

## 2019-10-28 DIAGNOSIS — H2512 Age-related nuclear cataract, left eye: Secondary | ICD-10-CM | POA: Diagnosis not present

## 2019-10-28 DIAGNOSIS — H25812 Combined forms of age-related cataract, left eye: Secondary | ICD-10-CM | POA: Diagnosis not present

## 2019-11-04 DIAGNOSIS — H25811 Combined forms of age-related cataract, right eye: Secondary | ICD-10-CM | POA: Diagnosis not present

## 2019-11-04 DIAGNOSIS — H2511 Age-related nuclear cataract, right eye: Secondary | ICD-10-CM | POA: Diagnosis not present

## 2019-12-02 ENCOUNTER — Other Ambulatory Visit: Payer: Self-pay

## 2019-12-02 ENCOUNTER — Telehealth: Payer: Self-pay

## 2019-12-02 ENCOUNTER — Telehealth (INDEPENDENT_AMBULATORY_CARE_PROVIDER_SITE_OTHER): Payer: Medicare Other | Admitting: Family Medicine

## 2019-12-02 ENCOUNTER — Encounter: Payer: Self-pay | Admitting: Family Medicine

## 2019-12-02 DIAGNOSIS — R42 Dizziness and giddiness: Secondary | ICD-10-CM

## 2019-12-02 NOTE — Progress Notes (Signed)
Virtual Visit via Telephone Note  I connected with [name] on 12/02/19 at  4:40 PM EDT by telephone and verified that I am speaking with the correct person using two identifiers.   I discussed the limitations, risks, security and privacy concerns of performing an evaluation and management service by telephone and the availability of in person appointments. I also discussed with the patient that there may be a patient responsible charge related to this service. The patient expressed understanding and agreed to proceed.  Location patient: home, [place] Location provider: work or home office Participants present for the call: patient, provider Patient did not have a visit in the prior 7 days to address this/these issue(s).   History of Present Illness:  Acute telemedicine visit for vertigo: -Onset: about 8 days ago -she has had this several times in the past and was seen by a number of doctors and specialist and reports she had an extensive work-up -Maneuvers have helped in the past sometimes, she did some Epley maneuvers when this started this time and this helped, has done vestibular rehab in the past -Symptoms include: intermittent brief spells of vertigo triggered by certain movements of the head to the R; when doing chair yoga class today she suddenly got very dizzy, symptoms subsided with staying still, had some vomiting today with episode -Denies: vision changes, CP, SOB, fevers, malaise, HA, palpations, weakness or numbness -meclizine does not help when she has this as it makes her very dizzy -Pertinent past medical history: vertigo - recurrent -Pertinent medication allergies: adhesive tape -COVID-19 vaccine status: fully vaccinated -BP 123/70, P 60-70   Observations/Objective: Patient sounds cheerful and well on the phone. I do not appreciate any SOB. Speech and thought processing are grossly intact. Patient reported vitals:  Assessment and Plan:  Vertigo  -we discussed  possible serious and likely etiologies, options for evaluation and workup, limitations of telemedicine visit vs in person visit, treatment, treatment risks and precautions. Pt prefers to treat via telemedicine empirically rather than in person at this moment. It seems that she has had recurrent vertigo for some time and has had an extensive prior work-up. Her symptoms seem to be consistent with positional vertigo. She opted to try empiric treatment with maneuvers, send a link for the home treatment with Dr. Richrd Sox. Advised not to drive with vertigo. Did advise that she will need an in person visit either with her primary care doctor or her specialist if her symptoms do not resolve with this, to ensure this is a recurrence of her prior issues rather than a new concern and to do an exam. She was agreeable. She has not found medications helpful in the past.   Advised to seek prompt follow up telemedicine visit or in person care if worsening, new symptoms arise, or if is not improving with treatment. Did let the patient know that I only do telemedicine shifts for [place] on Tuesdays and Thursdays and advised a follow up visit with PCP or at an [facility] if has further questions or concerns.   Follow Up Instructions:  I did not refer this patient for an OV in the next 24 hours for this/these issue(s).  I discussed the assessment and treatment plan with the patient. The patient was provided an opportunity to ask questions and all were answered. The patient agreed with the plan and demonstrated an understanding of the instructions.   I provided 19 minutes of non-face-to-face time during this encounter.   Mia Kern, DO

## 2019-12-02 NOTE — Patient Instructions (Signed)
Please see the link below for more information and a treatment for vertigo.  [email or web address]  Please do not drive with vertigo.  Follow up with your primary care doctor or your ear nose and throat specialist for an in person visit if your symptoms do not resolve with the treatment above.  I hope you are feeling better soon! Seek in person care promptly if your symptoms worsen, new concerns arise or you are not improving with treatment.

## 2019-12-02 NOTE — Telephone Encounter (Signed)
Pt called states experiencing intense vertigo today . Pt states has been building up for one week. Pt states has been doing Epi exercises regularly twice a day. Please advise pt

## 2019-12-02 NOTE — Telephone Encounter (Signed)
Spoke with the Mia Peterson and scheduled a virtual visit for today with Dr Kim as PCP is out of the office. 

## 2019-12-03 ENCOUNTER — Encounter: Payer: Self-pay | Admitting: Family Medicine

## 2019-12-06 ENCOUNTER — Encounter: Payer: Self-pay | Admitting: Family Medicine

## 2019-12-07 ENCOUNTER — Ambulatory Visit: Payer: Medicare Other

## 2019-12-07 NOTE — Telephone Encounter (Signed)
I am glad she is doing better. I believe Dr. Elease Hashimoto saw her messages as well. Recommend follow up with ENT or PCP to address persistent issues. Thanks!

## 2019-12-13 ENCOUNTER — Other Ambulatory Visit: Payer: Self-pay | Admitting: Family Medicine

## 2019-12-21 ENCOUNTER — Ambulatory Visit: Payer: Medicare Other | Attending: Internal Medicine

## 2019-12-21 DIAGNOSIS — Z23 Encounter for immunization: Secondary | ICD-10-CM

## 2019-12-21 NOTE — Progress Notes (Signed)
   Covid-19 Vaccination Clinic  Name:  Mia Peterson    MRN: [id number] DOB: 06-27-1945  12/21/2019  [name] was observed post Covid-19 immunization for 15 minutes without incident. She was provided with Vaccine Information Sheet and instruction to access the V-Safe system.   [name] was instructed to call 911 with any severe reactions post vaccine: Marland Kitchen Difficulty breathing  . Swelling of face and throat  . A fast heartbeat  . A bad rash all over body  . Dizziness and weakness

## 2020-01-24 ENCOUNTER — Ambulatory Visit: Payer: Medicare Other

## 2020-02-09 DIAGNOSIS — D485 Neoplasm of uncertain behavior of skin: Secondary | ICD-10-CM | POA: Diagnosis not present

## 2020-02-09 DIAGNOSIS — L814 Other melanin hyperpigmentation: Secondary | ICD-10-CM | POA: Diagnosis not present

## 2020-02-09 DIAGNOSIS — L821 Other seborrheic keratosis: Secondary | ICD-10-CM | POA: Diagnosis not present

## 2020-02-09 DIAGNOSIS — D225 Melanocytic nevi of trunk: Secondary | ICD-10-CM | POA: Diagnosis not present

## 2020-02-09 DIAGNOSIS — Z85828 Personal history of other malignant neoplasm of skin: Secondary | ICD-10-CM | POA: Diagnosis not present

## 2020-02-09 DIAGNOSIS — D0471 Carcinoma in situ of skin of right lower limb, including hip: Secondary | ICD-10-CM | POA: Diagnosis not present

## 2020-02-09 DIAGNOSIS — L820 Inflamed seborrheic keratosis: Secondary | ICD-10-CM | POA: Diagnosis not present

## 2020-02-15 ENCOUNTER — Ambulatory Visit (INDEPENDENT_AMBULATORY_CARE_PROVIDER_SITE_OTHER): Payer: Medicare Other

## 2020-02-15 ENCOUNTER — Other Ambulatory Visit: Payer: Self-pay

## 2020-02-15 DIAGNOSIS — Z23 Encounter for immunization: Secondary | ICD-10-CM | POA: Diagnosis not present

## 2020-02-15 NOTE — Progress Notes (Signed)
Flu shot given today.  Patient tolerated well.

## 2020-02-29 ENCOUNTER — Other Ambulatory Visit: Payer: Self-pay | Admitting: Family Medicine

## 2020-03-11 ENCOUNTER — Encounter: Payer: Self-pay | Admitting: Family Medicine

## 2020-03-13 ENCOUNTER — Other Ambulatory Visit: Payer: Self-pay | Admitting: Family Medicine

## 2020-03-20 ENCOUNTER — Other Ambulatory Visit: Payer: Self-pay

## 2020-03-21 ENCOUNTER — Other Ambulatory Visit: Payer: Self-pay

## 2020-03-21 ENCOUNTER — Ambulatory Visit (INDEPENDENT_AMBULATORY_CARE_PROVIDER_SITE_OTHER): Payer: Medicare Other | Admitting: Family Medicine

## 2020-03-21 ENCOUNTER — Encounter: Payer: Self-pay | Admitting: Family Medicine

## 2020-03-21 VITALS — BP 118/78 | HR 68 | Ht 67.0 in | Wt 150.0 lb

## 2020-03-21 DIAGNOSIS — M8580 Other specified disorders of bone density and structure, unspecified site: Secondary | ICD-10-CM

## 2020-03-21 DIAGNOSIS — K5730 Diverticulosis of large intestine without perforation or abscess without bleeding: Secondary | ICD-10-CM | POA: Insufficient documentation

## 2020-03-21 DIAGNOSIS — Z1211 Encounter for screening for malignant neoplasm of colon: Secondary | ICD-10-CM | POA: Insufficient documentation

## 2020-03-21 DIAGNOSIS — K640 First degree hemorrhoids: Secondary | ICD-10-CM | POA: Insufficient documentation

## 2020-03-21 DIAGNOSIS — R110 Nausea: Secondary | ICD-10-CM | POA: Insufficient documentation

## 2020-03-21 DIAGNOSIS — K559 Vascular disorder of intestine, unspecified: Secondary | ICD-10-CM | POA: Insufficient documentation

## 2020-03-21 DIAGNOSIS — R152 Fecal urgency: Secondary | ICD-10-CM | POA: Insufficient documentation

## 2020-03-21 DIAGNOSIS — R159 Full incontinence of feces: Secondary | ICD-10-CM | POA: Insufficient documentation

## 2020-03-21 DIAGNOSIS — E785 Hyperlipidemia, unspecified: Secondary | ICD-10-CM

## 2020-03-21 LAB — HEPATIC FUNCTION PANEL
ALT: 20 U/L (ref 0–35)
AST: 30 U/L (ref 0–37)
Albumin: 3.9 g/dL (ref 3.5–5.2)
Alkaline Phosphatase: 53 U/L (ref 39–117)
Bilirubin, Direct: 0.1 mg/dL (ref 0.0–0.3)
Total Bilirubin: 0.5 mg/dL (ref 0.2–1.2)
Total Protein: 6.5 g/dL (ref 6.0–8.3)

## 2020-03-21 LAB — LIPID PANEL
Cholesterol: 133 mg/dL (ref 0–200)
HDL: 69.5 mg/dL (ref >39.00)
LDL Cholesterol: 55 mg/dL (ref 0–99)
NonHDL: 63.1
Total CHOL/HDL Ratio: 2
Triglycerides: 39 mg/dL (ref 0.0–149.0)
VLDL: 7.8 mg/dL (ref 0.0–40.0)

## 2020-03-21 MED ORDER — ATORVASTATIN CALCIUM 40 MG PO TABS
ORAL_TABLET | ORAL | 3 refills | Status: DC
Start: 2020-03-21 — End: 2021-03-07

## 2020-03-21 NOTE — Addendum Note (Signed)
Addended by: Marrion Coy on: 03/21/2020 10:02 AM   Modules accepted: Orders

## 2020-03-21 NOTE — Patient Instructions (Signed)
High Cholesterol  High cholesterol is a condition in which the blood has high levels of a white, waxy substance similar to fat (cholesterol). The liver makes all the cholesterol that the body needs. The human body needs small amounts of cholesterol to help build cells. A person gets extra or excess cholesterol from the food that he or she eats. The blood carries cholesterol from the liver to the rest of the body. If you have high cholesterol, deposits (plaques) may build up on the walls of your arteries. Arteries are the blood vessels that carry blood away from your heart. These plaques make the arteries narrow and stiff. Cholesterol plaques increase your risk for heart attack and stroke. Work with your health care provider to keep your cholesterol levels in a healthy range. What increases the risk? The following factors may make you more likely to develop this condition:  Eating foods that are high in animal fat (saturated fat) or cholesterol.  Being overweight.  Not getting enough exercise.  A family history of high cholesterol (familial hypercholesterolemia).  Use of tobacco products.  Having diabetes. What are the signs or symptoms? There are no symptoms of this condition. How is this diagnosed? This condition may be diagnosed based on the results of a blood test.  If you are older than 75 years of age, your health care provider may check your cholesterol levels every 4-6 years.  You may be checked more often if you have high cholesterol or other risk factors for heart disease. The blood test for cholesterol measures:  "Bad" cholesterol, or LDL cholesterol. This is the main type of cholesterol that causes heart disease. The desired level is less than 100 mg/dL.  "Good" cholesterol, or HDL cholesterol. HDL helps protect against heart disease by cleaning the arteries and carrying the LDL to the liver for processing. The desired level for HDL is 60 mg/dL or higher.  Triglycerides.  These are fats that your body can store or burn for energy. The desired level is less than 150 mg/dL.  Total cholesterol. This measures the total amount of cholesterol in your blood and includes LDL, HDL, and triglycerides. The desired level is less than 200 mg/dL. How is this treated? This condition may be treated with:  Diet changes. You may be asked to eat foods that have more fiber and less saturated fats or added sugar.  Lifestyle changes. These may include regular exercise, maintaining a healthy weight, and quitting use of tobacco products.  Medicines. These are given when diet and lifestyle changes have not worked. You may be prescribed a statin medicine to help lower your cholesterol levels. Follow these instructions at home: Eating and drinking  Eat a healthy, balanced diet. This diet includes: ? Daily servings of a variety of fresh, frozen, or canned fruits and vegetables. ? Daily servings of whole grain foods that are rich in fiber. ? Foods that are low in saturated fats and trans fats. These include poultry and fish without skin, lean cuts of meat, and low-fat dairy products. ? A variety of fish, especially oily fish that contain omega-3 fatty acids. Aim to eat fish at least 2 times a week.  Avoid foods and drinks that have added sugar.  Use healthy cooking methods, such as roasting, grilling, broiling, baking, poaching, steaming, and stir-frying. Do not fry your food except for stir-frying.   Lifestyle  Get regular exercise. Aim to exercise for a total of 150 minutes a week. Increase your activity level by doing activities   such as gardening, walking, and taking the stairs.  Do not use any products that contain nicotine or tobacco, such as cigarettes, e-cigarettes, and chewing tobacco. If you need help quitting, ask your health care provider.   General instructions  Take over-the-counter and prescription medicines only as told by your health care provider.  Keep all  follow-up visits as told by your health care provider. This is important. Where to find more information  American Heart Association: www.heart.org  National Heart, Lung, and Blood Institute: www.nhlbi.nih.gov Contact a health care provider if:  You have trouble achieving or maintaining a healthy diet or weight.  You are starting an exercise program.  You are unable to stop smoking. Get help right away if:  You have chest pain.  You have trouble breathing.  You have any symptoms of a stroke. "BE FAST" is an easy way to remember the main warning signs of a stroke: ? B - Balance. Signs are dizziness, sudden trouble walking, or loss of balance. ? E - Eyes. Signs are trouble seeing or a sudden change in vision. ? F - Face. Signs are sudden weakness or numbness of the face, or the face or eyelid drooping on one side. ? A - Arms. Signs are weakness or numbness in an arm. This happens suddenly and usually on one side of the body. ? S - Speech. Signs are sudden trouble speaking, slurred speech, or trouble understanding what people say. ? T - Time. Time to call emergency services. Write down what time symptoms started.  You have other signs of a stroke, such as: ? A sudden, severe headache with no known cause. ? Nausea or vomiting. ? Seizure. These symptoms may represent a serious problem that is an emergency. Do not wait to see if the symptoms will go away. Get medical help right away. Call your local emergency services (911 in the [place] Do not drive yourself to the hospital. Summary  Cholesterol plaques increase your risk for heart attack and stroke. Work with your health care provider to keep your cholesterol levels in a healthy range.  Eat a healthy, balanced diet, get regular exercise, and maintain a healthy weight.  Do not use any products that contain nicotine or tobacco, such as cigarettes, e-cigarettes, and chewing tobacco.  Get help right away if you have any symptoms of a  stroke. This information is not intended to replace advice given to you by your health care provider. Make sure you discuss any questions you have with your health care provider. Document Revised: 01/11/2019 Document Reviewed: 01/11/2019 Elsevier Patient Education  2021 Elsevier Inc.  

## 2020-03-21 NOTE — Progress Notes (Signed)
Established Patient Office Visit  Subjective:  [name], female    DOB: 02/21/1946  Age: 75 y.o. MRN: [id number]  CC: No chief complaint on file.   HPI Mia Peterson presents for medical follow-up.  She has history of hyperlipidemia.  Her mother had coronary artery disease age 55.  Mia Peterson also has prior history of probable TIA.  She takes Lipitor 40 mg daily.  She is overdue for follow-up labs.  Denies any myalgias or other side effects from medication.  She states that last Friday she had UTS type symptoms with urine frequency and burning.  She had some leftover minocycline and started that on her own.  After taking that couple days her symptoms resolved.  She has no dysuria at this time.  She has history of osteopenia with increased FRAX score.  She has been on Fosamax for about a year and a half.  Her last DEXA scan showed some improvement in bone density both of the spine and hip level.  She denies any side effects from medication.  She has mammogram scheduled for later this week.  Past Medical History:  Diagnosis Date  . Arthritis    right knee and foot- OSteo   . Asthma    treated in 20's and 30's  . Cancer (HCC)    skin- squamous basal  . Complication of anesthesia    very slow to awaken   . GERD (gastroesophageal reflux disease)    prn Prilosec  . Head injury, closed    10/26/15- years ago - 30ish  . Shortness of breath dyspnea    with exertion  . Vaso vagal episode    passed out 2 times, close to passing out 3 times  . Vertigo    was treated with rehab March and April 2017, does exercises if she begins to have symptoms    Past Surgical History:  Procedure Laterality Date  . ABDOMINAL HYSTERECTOMY  1993   menorrhagia.  Marland Kitchen BREAST ENHANCEMENT SURGERY  1978  . COLONOSCOPY W/ POLYPECTOMY    . COLONOSCOPY WITH PROPOFOL N/A 02/03/2017   Procedure: COLONOSCOPY WITH PROPOFOL;  Surgeon: Jeani Hawking, MD;  Location: [facility];  Service: Endoscopy;   Laterality: N/A;  . KNEE ARTHROSCOPY W/ MENISCAL REPAIR Right   . right knee replaceent     . 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 TUBAL LIGATION  1972  . VARICOSE VEIN SURGERY  1978    Family History  Problem Relation Age of Onset  . Heart disease Mother 24  . Cancer Father 92       pancreatic    Social History   Socioeconomic History  . Marital status: Married    Spouse name: Not on file  . Number of children: Not on file  . Years of education: Not on file  . Highest education level: Not on file  Occupational History  . Not on file  Tobacco Use  . Smoking status: Former Smoker    Packs/day: 1.00    Years: 20.00    Pack years: 20.00    Types: Cigarettes    Start date: 33    Quit date: 03/24/1990    Years since quitting: 30.0  . Smokeless tobacco: Never Used  Vaping Use  . Vaping Use: Never used  Substance and Sexual Activity  . Alcohol use: Yes  Alcohol/week: 6.0 standard drinks    Types: 6 Glasses of wine per week    Comment: ocasional use   . Drug use: No  . Sexual activity: Not on file  Other Topics Concern  . Not on file  Social History Narrative  . Not on file   Social Determinants of Health   Financial Resource Strain: Not on file  Food Insecurity: Not on file  Transportation Needs: Not on file  Physical Activity: Not on file  Stress: Not on file  Social Connections: Not on file  Intimate Partner Violence: Not on file    Outpatient Medications Prior to Visit  Medication Sig Dispense Refill  . alendronate (FOSAMAX) 70 MG tablet TAKE 1 TABLET EVERY 7 DAYS. TAKE WITH A FULL GLASS OF WATER ON AN EMPTY STOMACH. 12 tablet 0  . aspirin EC 81 MG tablet Take 81 mg by mouth daily.    . Azelaic Acid 15 % cream APPLY TO AFFECTED AREA EVERY DAY    . Calcium Carb-Cholecalciferol (CALCIUM 600 + D PO) Take 2 tablets by  mouth daily.    . calcium-vitamin D (OSCAL WITH D) 500-200 MG-UNIT TABS tablet Take by mouth.    . cycloSPORINE (RESTASIS) 0.05 % ophthalmic emulsion Place 1 drop into both eyes 2 (two) times daily.    . DUREZOL 0.05 % EMUL Place 1 drop into the right eye 4 (four) times daily.    . Flaxseed, Linseed, (FLAX SEED OIL) 1000 MG CAPS Take 1,000 mg by mouth daily.    . Ginger, Zingiber officinalis, (GINGER ROOT) 550 MG CAPS Take 550 mg by mouth daily.    Marland Kitchen moxifloxacin (VIGAMOX) 0.5 % ophthalmic solution PLEASE SEE ATTACHED FOR DETAILED DIRECTIONS    . Multiple Vitamins-Minerals (ONE DAILY WOMENS 50+ PO) Take 1 tablet by mouth daily.    Marland Kitchen omeprazole (PRILOSEC) 20 MG capsule Take by mouth.    Marland Kitchen POTASSIUM PO Take 1 tablet by mouth daily.    . TURMERIC PO Take 400 mg by mouth 2 (two) times daily.     Marland Kitchen atorvastatin (LIPITOR) 40 MG tablet TAKE 1 TABLET DAILY (NEED APPOINTMENT) 90 tablet 0   No facility-administered medications prior to visit.    Allergies  Allergen Reactions  . Other Rash    Derald Macleod cooling pads, severe itching  . Adhesive [Tape] Hives    Use paper tape only    ROS Review of Systems  Constitutional: Negative for fatigue and unexpected weight change.  Eyes: Negative for visual disturbance.  Respiratory: Negative for cough, chest tightness, shortness of breath and wheezing.   Cardiovascular: Negative for chest pain, palpitations and leg swelling.  Endocrine: Negative for polydipsia and polyuria.  Musculoskeletal: Negative for myalgias.  Neurological: Negative for dizziness, seizures, syncope, weakness, light-headedness and headaches.      Objective:    Physical Exam Vitals reviewed.  Constitutional:      Appearance: Normal appearance.  Cardiovascular:     Rate and Rhythm: Normal rate and regular rhythm.  Pulmonary:     Effort: Pulmonary effort is normal.     Breath sounds: Normal breath sounds.  Musculoskeletal:     Right lower leg: No edema.     Left lower leg:  No edema.  Neurological:     Mental Status: She is alert.     BP 118/78   Pulse 68   Ht 5\' 7"  (1.702 m)   Wt 150 lb (68 kg)   SpO2 98%   BMI 23.49 kg/m  Wt Readings from Last 3 Encounters:  03/21/20 150 lb (68 kg)  04/01/19 151 lb 12.8 oz (68.9 kg)  11/17/18 149 lb 12.8 oz (67.9 kg)     There are no preventive care reminders to display for this patient.  There are no preventive care reminders to display for this patient.  Lab Results  Component Value Date   TSH 1.59 10/30/2018   Lab Results  Component Value Date   WBC 4.4 10/30/2018   HGB 13.5 10/30/2018   HCT 39.7 10/30/2018   MCV 88.3 10/30/2018   PLT 278.0 10/30/2018   Lab Results  Component Value Date   NA 137 10/30/2018   K 4.4 10/30/2018   CO2 29 10/30/2018   GLUCOSE 85 10/30/2018   BUN 13 10/30/2018   CREATININE 0.69 10/30/2018   BILITOT 0.6 07/17/2018   ALKPHOS 61 07/17/2018   AST 32 07/17/2018   ALT 22 07/17/2018   PROT 6.4 07/17/2018   ALBUMIN 4.0 07/17/2018   CALCIUM 9.1 10/30/2018   ANIONGAP 7 02/01/2017   GFR 83.27 10/30/2018   Lab Results  Component Value Date   CHOL 144 07/17/2018   Lab Results  Component Value Date   HDL 67.20 07/17/2018   Lab Results  Component Value Date   LDLCALC 66 07/17/2018   Lab Results  Component Value Date   TRIG 53.0 07/17/2018   Lab Results  Component Value Date   CHOLHDL 2 07/17/2018   No results found for: HGBA1C    Assessment & Plan:   Problem List Items Addressed This Visit      Unprioritized   Osteopenia - Primary    Other Visit Diagnoses    Dyslipidemia       Relevant Medications   atorvastatin (LIPITOR) 40 MG tablet   Other Relevant Orders   Lipid panel   Hepatic function panel    -Check labs with lipid and hepatic panel -Refilled Lipitor for 1 year 40 mg once daily -Follow-up immediately for any recurrent dysuria -Consider repeat DEXA scan in about a year -Continue regular calcium and vitamin D and weightbearing  exercise.  We discussed continuing Fosamax for goal of 5 years  Meds ordered this encounter  Medications  . atorvastatin (LIPITOR) 40 MG tablet    Sig: TAKE 1 TABLET DAILY    Dispense:  90 tablet    Refill:  3    Follow-up: No follow-ups on file.    Carolann Littler, MD

## 2020-03-21 NOTE — Addendum Note (Signed)
Addended by: Aarthi Uyeno. M on: 03/21/2020 10:02 AM   Modules accepted: Orders  

## 2020-03-22 NOTE — Progress Notes (Signed)
Mychart message sent: Lipids are well controlled and liver panel is normal.

## 2020-03-22 NOTE — Progress Notes (Signed)
Mychart message sent: Lipids are well controlled and liver panel is normal.

## 2020-03-23 ENCOUNTER — Encounter: Payer: Self-pay | Admitting: Family Medicine

## 2020-04-06 DIAGNOSIS — M25561 Pain in right knee: Secondary | ICD-10-CM | POA: Diagnosis not present

## 2020-04-06 DIAGNOSIS — Z96651 Presence of right artificial knee joint: Secondary | ICD-10-CM | POA: Diagnosis not present

## 2020-04-06 DIAGNOSIS — Z471 Aftercare following joint replacement surgery: Secondary | ICD-10-CM | POA: Diagnosis not present

## 2020-04-24 ENCOUNTER — Telehealth: Payer: Self-pay | Admitting: Family Medicine

## 2020-04-24 NOTE — Telephone Encounter (Signed)
Left message for patient to call back and schedule Medicare Annual Wellness Visit (AWV) either virtually or in office. No detailed message left   Last AWV 06/10/2018 please schedule at anytime with [facility] Nurse Health Advisor 1 or 2   This should be a 45 minute visit.

## 2020-05-04 DIAGNOSIS — Z471 Aftercare following joint replacement surgery: Secondary | ICD-10-CM | POA: Diagnosis not present

## 2020-05-04 DIAGNOSIS — M25561 Pain in right knee: Secondary | ICD-10-CM | POA: Diagnosis not present

## 2020-05-04 DIAGNOSIS — Z96651 Presence of right artificial knee joint: Secondary | ICD-10-CM | POA: Diagnosis not present

## 2020-05-12 NOTE — Progress Notes (Signed)
Subjective:   Mia Peterson is a 75 y.o. female who presents for Medicare Annual (Subsequent) preventive examination.  I connected with Mia Peterson today by telephone and verified that I am speaking with the correct person using two identifiers. Location patient: home Location provider: work Persons participating in the virtual visit: patient, provider.   I discussed the limitations, risks, security and privacy concerns of performing an evaluation and management service by telephone and the availability of in person appointments. I also discussed with the patient that there may be a patient responsible charge related to this service. The patient expressed understanding and verbally consented to this telephonic visit.    Interactive audio and video telecommunications were attempted between this provider and patient, however failed, due to patient having technical difficulties OR patient did not have access to video capability.  We continued and completed visit with audio only.      Review of Systems    N/A  Cardiac Risk Factors include: advanced age (>60men, >48 women)     Objective:    Today's Vitals   There is no height or weight on file to calculate BMI.  Advanced Directives 05/15/2020 05/27/2017 02/01/2017 04/03/2016 11/03/2015 10/26/2015 05/09/2015  Does Patient Have a Medical Advance Directive? Yes Yes Yes Yes Yes Yes Yes  Type of Paramedic of [place];Living will - [place];Living will - [place];Living will [place];Living will [place];Living will  Does patient want to make changes to medical advance directive? No - Patient declined - No - Patient declined - No - Patient declined - No - Patient declined  Copy of [place] in Chart? No - copy requested - No - copy requested - No - copy requested No - copy requested -    Current Medications  (verified) Outpatient Encounter Medications as of 05/15/2020  Medication Sig  . alendronate (FOSAMAX) 70 MG tablet TAKE 1 TABLET EVERY 7 DAYS. TAKE WITH A FULL GLASS OF WATER ON AN EMPTY STOMACH.  Marland Kitchen aspirin EC 81 MG tablet Take 81 mg by mouth daily.  Marland Kitchen atorvastatin (LIPITOR) 40 MG tablet TAKE 1 TABLET DAILY  . Azelaic Acid 15 % cream APPLY TO AFFECTED AREA EVERY DAY  . Calcium Carb-Cholecalciferol (CALCIUM 600 + D PO) Take 2 tablets by mouth daily.  . calcium-vitamin D (OSCAL WITH D) 500-200 MG-UNIT TABS tablet Take by mouth.  . cycloSPORINE (RESTASIS) 0.05 % ophthalmic emulsion Place 1 drop into both eyes 2 (two) times daily.  . DUREZOL 0.05 % EMUL Place 1 drop into the right eye 4 (four) times daily.  . Flaxseed, Linseed, (FLAX SEED OIL) 1000 MG CAPS Take 1,000 mg by mouth daily.  . Ginger, Zingiber officinalis, (GINGER ROOT) 550 MG CAPS Take 550 mg by mouth daily.  . Multiple Vitamins-Minerals (ONE DAILY WOMENS 50+ PO) Take 1 tablet by mouth daily.  Marland Kitchen omeprazole (PRILOSEC) 20 MG capsule Take by mouth.  . oxybutynin (DITROPAN-XL) 5 MG 24 hr tablet Take 5 mg by mouth at bedtime.  Marland Kitchen POTASSIUM PO Take 1 tablet by mouth daily.  . TURMERIC PO Take 400 mg by mouth 2 (two) times daily.   . [DISCONTINUED] moxifloxacin (VIGAMOX) 0.5 % ophthalmic solution PLEASE SEE ATTACHED FOR DETAILED DIRECTIONS (Patient not taking: Reported on 05/15/2020)   No facility-administered encounter medications on file as of 05/15/2020.    Allergies (verified) Other and Adhesive [tape]   History: Past Medical History:  Diagnosis  Date  . Arthritis    right knee and foot- OSteo   . Asthma    treated in 20's and 30's  . 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 Cataract   . Complication of anesthesia    very slow to awaken   . GERD (gastroesophageal reflux disease)    prn Prilosec  . Head injury, closed    10/26/15- years ago - 30ish  . Shortness of breath dyspnea    with exertion  . Vaso vagal episode     passed out 2 times, close to passing out 3 times  . Vertigo    was treated with rehab March and April 2017, does exercises if she begins to have symptoms   Past Surgical History:  Procedure Laterality Date  . ABDOMINAL HYSTERECTOMY  1993   menorrhagia.  Marland Kitchen BREAST ENHANCEMENT SURGERY  1978  . CATARACT EXTRACTION, BILATERAL    . COLONOSCOPY W/ POLYPECTOMY    . COLONOSCOPY WITH PROPOFOL N/A 02/03/2017   Procedure: COLONOSCOPY WITH PROPOFOL;  Surgeon: Carol Ada, MD;  Location: [facility];  Service: Endoscopy;  Laterality: N/A;  . KNEE ARTHROSCOPY W/ MENISCAL REPAIR Right   . right knee replaceent     . TOTAL KNEE ARTHROPLASTY Right 11/03/2015   Procedure: RIGHT TOTAL KNEE ARTHROPLASTY;  Surgeon: Dorna Leitz, MD;  Location: [facility];  Service: Orthopedics;  Laterality: Right;  . TUBAL LIGATION  1972  . VARICOSE VEIN SURGERY  1978   Family History  Problem Relation Age of Onset  . Heart disease Mother 36  . Cancer Father 72       pancreatic   Social History   Socioeconomic History  . Marital status: Married    Spouse name: Not on file  . Number of children: Not on file  . Years of education: Not on file  . Highest education level: Not on file  Occupational History  . Not on file  Tobacco Use  . Smoking status: Former Smoker    Packs/day: 1.00    Years: 20.00    Pack years: 20.00    Types: Cigarettes    Start date: 36    Quit date: 03/24/1990    Years since quitting: 30.1  . Smokeless tobacco: Never Used  Vaping Use  . Vaping Use: Never used  Substance and Sexual Activity  . Alcohol use: Yes    Alcohol/week: 6.0 standard drinks    Types: 6 Glasses of wine per week    Comment: ocasional use   . Drug use: No  . Sexual activity: Not on file  Other Topics Concern  . Not on file  Social History Narrative  . Not on file   Social Determinants of Health   Financial Resource Strain: Low Risk   . Difficulty of Paying Living Expenses: Not hard at all  Food Insecurity:  No Food Insecurity  . Worried About Charity fundraiser in the Last Year: Never true  . Ran Out of Food in the Last Year: Never true  Transportation Needs: No Transportation Needs  . Lack of Transportation (Medical): No  . Lack of Transportation (Non-Medical): No  Physical Activity: Sufficiently Active  . Days of Exercise per Week: 6 days  . Minutes of Exercise per Session: 60 min  Stress: No Stress Concern Present  . Feeling of Stress : Not at all  Social Connections: Socially Integrated  . Frequency of Communication with Friends and Family: More than three times a week  .  Frequency of Social Gatherings with Friends and Family: More than three times a week  . Attends Religious Services: 1 to 4 times per year  . Active Member of Clubs or Organizations: Yes  . Attends Archivist Meetings: More than 4 times per year  . Marital Status: Married    Tobacco Counseling Counseling given: Not Answered   Clinical Intake:  Pre-visit preparation completed: Yes  Pain : No/denies pain     Nutritional Risks: None Diabetes: No  How often do you need to have someone help you when you read instructions, pamphlets, or other written materials from your doctor or pharmacy?: 1 - Never What is the last grade level you completed in school?: College  Diabetic?No  Interpreter Needed?: No  Information entered by :: [place] of Daily Living In your present state of health, do you have any difficulty performing the following activities: 05/15/2020 03/21/2020  Hearing? Y N  Comment has bilateral hearing aids -  Vision? N N  Difficulty concentrating or making decisions? Y N  Comment has some difficulty finding words or names -  Walking or climbing stairs? N N  Dressing or bathing? N N  Doing errands, shopping? N N  Preparing Food and eating ? N -  Using the Toilet? N -  In the past six months, have you accidently leaked urine? Y -  Comment has bladder leakage -  Do  you have problems with loss of bowel control? N -  Managing your Medications? N -  Managing your Finances? N -  Housekeeping or managing your Housekeeping? N -  Some recent data might be hidden    Patient Care Team: Eulas Post, MD as PCP - General  Indicate any recent Medical Services you may have received from other than [facility] providers in the past year (date may be approximate).     Assessment:   This is a routine wellness examination for Mia Peterson.  Hearing/Vision screen  Hearing Screening   125Hz  250Hz  500Hz  1000Hz  2000Hz  3000Hz  4000Hz  6000Hz  8000Hz   Right ear:           Left ear:           Vision Screening Comments: Gets eyes examined once per year. Has hx of cataract extractions. Wears a contact in left eye. Wears glasses while driving   Dietary issues and exercise activities discussed: Current Exercise Habits: Home exercise routine, Type of exercise: yoga;walking;strength training/weights, Time (Minutes): 60, Frequency (Times/Week): 6, Weekly Exercise (Minutes/Week): 360, Intensity: Moderate  Goals    . patient     Continue to maintain your health through diet and exercise  Work with church     . Patient Stated     I am trying to work on my balance through yoga    . Weight (lb) < 150 lb (68 kg)     Give up wine (one glass)       Depression Screen PHQ 2/9 Scores 05/15/2020 05/27/2017 04/03/2016 03/30/2015 09/29/2014 01/19/2013 01/16/2012  PHQ - 2 Score 0 0 0 0 0 0 0    Fall Risk Fall Risk  05/15/2020 03/21/2020 09/25/2018 05/27/2017 04/03/2016  Falls in the past year? 0 0 0 Yes No  Comment - - Emmi Telephone Survey: data to providers prior to load just when she had the colitis  -  Number falls in past yr: 0 - - - -  Injury with Fall? 0 - - - -  Risk for fall due to : No  Fall Risks - - - -  Follow up Falls evaluation completed;Falls prevention discussed - - - -    FALL RISK PREVENTION PERTAINING TO THE HOME:  Any stairs in or around the home? No  If so, are there any  without handrails? No  Home free of loose throw rugs in walkways, pet beds, electrical cords, etc? Yes  Adequate lighting in your home to reduce risk of falls? Yes   ASSISTIVE DEVICES UTILIZED TO PREVENT FALLS:  Life alert? No  Use of a cane, walker or w/c? No  Grab bars in the bathroom? Yes  Shower chair or bench in shower? Yes  Elevated toilet seat or a handicapped toilet? No     Cognitive Function: MMSE - Mini Mental State Exam 05/27/2017 04/03/2016  Not completed: (No Data) (No Data)     6CIT Screen 05/15/2020  What Year? 0 points  What month? 0 points  What time? 0 points  Count back from 20 0 points  Months in reverse 0 points  Repeat phrase 0 points  Total Score 0    Immunizations Immunization History  Administered Date(s) Administered  . Fluad Quad(high Dose 65+) 10/29/2018, 02/15/2020  . Hepatitis A, Adult 08/15/2014, 01/16/2015  . Influenza Split 11/23/2010  . Influenza, High Dose Seasonal PF 01/19/2013, 01/03/2014, 01/16/2015, 12/21/2015, 12/10/2016, 12/18/2017  . PFIZER(Purple Top)SARS-COV-2 Vaccination 03/29/2019, 04/26/2019, 12/21/2019  . Pneumococcal Conjugate-13 01/19/2013  . Pneumococcal Polysaccharide-23 02/26/2007, 03/30/2015  . Td 02/26/2004  . Tdap 07/17/2018  . Zoster 11/23/2010    TDAP status: Up to date  Flu Vaccine status: Up to date  Pneumococcal vaccine status: Up to date  Covid-19 vaccine status: Completed vaccines  Qualifies for Shingles Vaccine? Yes   Zostavax completed Yes   Shingrix Completed?: No.    Education has been provided regarding the importance of this vaccine. Patient has been advised to call insurance company to determine out of pocket expense if they have not yet received this vaccine. Advised may also receive vaccine at local pharmacy or Health Dept. Verbalized acceptance and understanding.  Screening Tests Health Maintenance  Topic Date Due  . COVID-19 Vaccine (4 - Booster for Pfizer series) 06/20/2020  . COLONOSCOPY  (Pts 45-6yrs Insurance coverage will need to be confirmed)  02/03/2022  . TETANUS/TDAP  07/16/2028  . INFLUENZA VACCINE  Completed  . DEXA SCAN  Completed  . Hepatitis C Screening  Completed  . PNA vac Low Risk Adult  Completed  . HPV VACCINES  Aged Out    Health Maintenance  There are no preventive care reminders to display for this patient.  Colorectal cancer screening: Type of screening: Colonoscopy. Completed 02/03/2017. Repeat every 5 years  Mammogram status: Ordered 05/15/2020. Pt provided with contact info and advised to call to schedule appt.   Bone Density status: Completed 09/02/2019. Results reflect: Bone density results: OSTEOPENIA. Repeat every 2 years.  Lung Cancer Screening: (Low Dose CT Chest recommended if Age 62-80 years, 30 pack-year currently smoking OR have quit w/in 15years.) does not qualify.   Lung Cancer Screening Referral: N/A   Additional Screening:  Hepatitis C Screening: does qualify; Completed 07/17/2018  Vision Screening: Recommended annual ophthalmology exams for early detection of glaucoma and other disorders of the eye. Is the patient up to date with their annual eye exam?  Yes  Who is the provider or what is the name of the office in which the patient attends annual eye exams? Dr. Ellie Lunch If pt is not established with a provider, would they  like to be referred to a provider to establish care? No .   Dental Screening: Recommended annual dental exams for proper oral hygiene  Community Resource Referral / Chronic Care Management: CRR required this visit?  No   CCM required this visit?  No      Plan:     I have personally reviewed and noted the following in the patient's chart:   . Medical and social history . Use of alcohol, tobacco or illicit drugs  . Current medications and supplements . Functional ability and status . Nutritional status . Physical activity . Advanced directives . List of other physicians . Hospitalizations,  surgeries, and ER visits in previous 12 months . Vitals . Screenings to include cognitive, depression, and falls . Referrals and appointments  In addition, I have reviewed and discussed with patient certain preventive protocols, quality metrics, and best practice recommendations. A written personalized care plan for preventive services as well as general preventive health recommendations were provided to patient.     Mia Neas, LPN   [id number]   Nurse Notes: None

## 2020-05-15 ENCOUNTER — Ambulatory Visit (INDEPENDENT_AMBULATORY_CARE_PROVIDER_SITE_OTHER): Payer: Medicare Other

## 2020-05-15 DIAGNOSIS — Z0000 Encounter for general adult medical examination without abnormal findings: Secondary | ICD-10-CM

## 2020-05-15 NOTE — Patient Instructions (Signed)
Mia Peterson , Thank you for taking time to come for your Medicare Wellness Visit. I appreciate your ongoing commitment to your health goals. Please review the following plan we discussed and let me know if I can assist you in the future.   Screening recommendations/referrals: Colonoscopy: Up to date, next due 02/03/2022 Mammogram: Up to date, next due 05/15/2021 Bone Density: Up to date, next due 09/01/2021 Recommended yearly ophthalmology/optometry visit for glaucoma screening and checkup Recommended yearly dental visit for hygiene and checkup  Vaccinations: Influenza vaccine: Up to date, next due fall 2022  Pneumococcal vaccine: Completed series  Tdap vaccine: Up to date, next due 07/16/2028 Shingles vaccine: Currently due for Shingrix, if you would like to receive we recommend that you do so at your local pharmacy as it is less expensive     Advanced directives: Please bring in a copy of your advanced medical directives so that we may scan them into your chart.  Conditions/risks identified: None   Next appointment: 07/12/2021 @ 8:00 am with Dr. Elease Hashimoto   Preventive Care 65 Years and Older, Female Preventive care refers to lifestyle choices and visits with your health care provider that can promote health and wellness. What does preventive care include?  A yearly physical exam. This is also called an annual well check.  Dental exams once or twice a year.  Routine eye exams. Ask your health care provider how often you should have your eyes checked.  Personal lifestyle choices, including:  Daily care of your teeth and gums.  Regular physical activity.  Eating a healthy diet.  Avoiding tobacco and drug use.  Limiting alcohol use.  Practicing safe sex.  Taking low-dose aspirin every day.  Taking vitamin and mineral supplements as recommended by your health care provider. What happens during an annual well check? The services and screenings done by your health care  provider during your annual well check will depend on your age, overall health, lifestyle risk factors, and family history of disease. Counseling  Your health care provider may ask you questions about your:  Alcohol use.  Tobacco use.  Drug use.  Emotional well-being.  Home and relationship well-being.  Sexual activity.  Eating habits.  History of falls.  Memory and ability to understand (cognition).  Work and work Statistician.  Reproductive health. Screening  You may have the following tests or measurements:  Height, weight, and BMI.  Blood pressure.  Lipid and cholesterol levels. These may be checked every 5 years, or more frequently if you are over 3 years old.  Skin check.  Lung cancer screening. You may have this screening every year starting at age 70 if you have a 30-pack-year history of smoking and currently smoke or have quit within the past 15 years.  Fecal occult blood test (FOBT) of the stool. You may have this test every year starting at age 69.  Flexible sigmoidoscopy or colonoscopy. You may have a sigmoidoscopy every 5 years or a colonoscopy every 10 years starting at age 64.  Hepatitis C blood test.  Hepatitis B blood test.  Sexually transmitted disease (STD) testing.  Diabetes screening. This is done by checking your blood sugar (glucose) after you have not eaten for a while (fasting). You may have this done every 1-3 years.  Bone density scan. This is done to screen for osteoporosis. You may have this done starting at age 26.  Mammogram. This may be done every 1-2 years. Talk to your health care provider about how often you  should have regular mammograms. Talk with your health care provider about your test results, treatment options, and if necessary, the need for more tests. Vaccines  Your health care provider may recommend certain vaccines, such as:  Influenza vaccine. This is recommended every year.  Tetanus, diphtheria, and acellular  pertussis (Tdap, Td) vaccine. You may need a Td booster every 10 years.  Zoster vaccine. You may need this after age 30.  Pneumococcal 13-valent conjugate (PCV13) vaccine. One dose is recommended after age 17.  Pneumococcal polysaccharide (PPSV23) vaccine. One dose is recommended after age 80. Talk to your health care provider about which screenings and vaccines you need and how often you need them. This information is not intended to replace advice given to you by your health care provider. Make sure you discuss any questions you have with your health care provider. Document Released: 03/10/2015 Document Revised: 11/01/2015 Document Reviewed: 12/13/2014 Elsevier Interactive Patient Education  2017 [employer] Prevention in the Home Falls can cause injuries. They can happen to people of all ages. There are many things you can do to make your home safe and to help prevent falls. What can I do on the outside of my home?  Regularly fix the edges of walkways and driveways and fix any cracks.  Remove anything that might make you trip as you walk through a door, such as a raised step or threshold.  Trim any bushes or trees on the path to your home.  Use bright outdoor lighting.  Clear any walking paths of anything that might make someone trip, such as rocks or tools.  Regularly check to see if handrails are loose or broken. Make sure that both sides of any steps have handrails.  Any raised decks and porches should have guardrails on the edges.  Have any leaves, snow, or ice cleared regularly.  Use sand or salt on walking paths during winter.  Clean up any spills in your garage right away. This includes oil or grease spills. What can I do in the bathroom?  Use night lights.  Install grab bars by the toilet and in the tub and shower. Do not use towel bars as grab bars.  Use non-skid mats or decals in the tub or shower.  If you need to sit down in the shower, use a plastic,  non-slip stool.  Keep the floor dry. Clean up any water that spills on the floor as soon as it happens.  Remove soap buildup in the tub or shower regularly.  Attach bath mats securely with double-sided non-slip rug tape.  Do not have throw rugs and other things on the floor that can make you trip. What can I do in the bedroom?  Use night lights.  Make sure that you have a light by your bed that is easy to reach.  Do not use any sheets or blankets that are too big for your bed. They should not hang down onto the floor.  Have a firm chair that has side arms. You can use this for support while you get dressed.  Do not have throw rugs and other things on the floor that can make you trip. What can I do in the kitchen?  Clean up any spills right away.  Avoid walking on wet floors.  Keep items that you use a lot in easy-to-reach places.  If you need to reach something above you, use a strong step stool that has a grab bar.  Keep electrical cords out  of the way.  Do not use floor polish or wax that makes floors slippery. If you must use wax, use non-skid floor wax.  Do not have throw rugs and other things on the floor that can make you trip. What can I do with my stairs?  Do not leave any items on the stairs.  Make sure that there are handrails on both sides of the stairs and use them. Fix handrails that are broken or loose. Make sure that handrails are as long as the stairways.  Check any carpeting to make sure that it is firmly attached to the stairs. Fix any carpet that is loose or worn.  Avoid having throw rugs at the top or bottom of the stairs. If you do have throw rugs, attach them to the floor with carpet tape.  Make sure that you have a light switch at the top of the stairs and the bottom of the stairs. If you do not have them, ask someone to add them for you. What else can I do to help prevent falls?  Wear shoes that:  Do not have high heels.  Have rubber  bottoms.  Are comfortable and fit you well.  Are closed at the toe. Do not wear sandals.  If you use a stepladder:  Make sure that it is fully opened. Do not climb a closed stepladder.  Make sure that both sides of the stepladder are locked into place.  Ask someone to hold it for you, if possible.  Clearly mark and make sure that you can see:  Any grab bars or handrails.  First and last steps.  Where the edge of each step is.  Use tools that help you move around (mobility aids) if they are needed. These include:  Canes.  Walkers.  Scooters.  Crutches.  Turn on the lights when you go into a dark area. Replace any light bulbs as soon as they burn out.  Set up your furniture so you have a clear path. Avoid moving your furniture around.  If any of your floors are uneven, fix them.  If there are any pets around you, be aware of where they are.  Review your medicines with your doctor. Some medicines can make you feel dizzy. This can increase your chance of falling. Ask your doctor what other things that you can do to help prevent falls. This information is not intended to replace advice given to you by your health care provider. Make sure you discuss any questions you have with your health care provider. Document Released: 12/08/2008 Document Revised: 07/20/2015 Document Reviewed: 03/18/2014 Elsevier Interactive Patient Education  2017 [employer].

## 2020-05-23 ENCOUNTER — Other Ambulatory Visit: Payer: Self-pay | Admitting: Family Medicine

## 2020-06-23 DIAGNOSIS — R350 Frequency of micturition: Secondary | ICD-10-CM | POA: Diagnosis not present

## 2020-06-23 DIAGNOSIS — N3001 Acute cystitis with hematuria: Secondary | ICD-10-CM | POA: Diagnosis not present

## 2020-06-26 ENCOUNTER — Ambulatory Visit (INDEPENDENT_AMBULATORY_CARE_PROVIDER_SITE_OTHER): Payer: Medicare Other | Admitting: Family Medicine

## 2020-06-26 ENCOUNTER — Other Ambulatory Visit: Payer: Self-pay

## 2020-06-26 ENCOUNTER — Encounter: Payer: Self-pay | Admitting: Family Medicine

## 2020-06-26 VITALS — BP 100/68 | HR 66 | Temp 98.0°F | Ht 67.0 in | Wt 152.8 lb

## 2020-06-26 DIAGNOSIS — N390 Urinary tract infection, site not specified: Secondary | ICD-10-CM | POA: Diagnosis not present

## 2020-06-26 DIAGNOSIS — R300 Dysuria: Secondary | ICD-10-CM

## 2020-06-26 DIAGNOSIS — R319 Hematuria, unspecified: Secondary | ICD-10-CM

## 2020-06-26 LAB — POC URINALSYSI DIPSTICK (AUTOMATED)
Bilirubin, UA: NEGATIVE
Blood, UA: NEGATIVE
Glucose, UA: NEGATIVE
Ketones, UA: NEGATIVE
Leukocytes, UA: NEGATIVE
Nitrite, UA: NEGATIVE
Protein, UA: NEGATIVE
Spec Grav, UA: 1.015 (ref 1.010–1.025)
Urobilinogen, UA: 0.2 E.U./dL
pH, UA: 6 (ref 5.0–8.0)

## 2020-06-26 MED ORDER — FLUCONAZOLE 150 MG PO TABS: 150.0000 mg | ORAL_TABLET | Freq: Every day | ORAL | 2 refills | Status: DC

## 2020-06-26 NOTE — Addendum Note (Signed)
Addended by: Elmer Picker on: 06/26/2020 10:05 AM   Modules accepted: Orders

## 2020-06-26 NOTE — Progress Notes (Signed)
   Subjective:    [name], female    DOB: 1945-04-25, 75 y.o.   MRN: [id number]  HPI Here to follow up an urgent care visit on 06-23-20 for a UTI. She presented that day with several days of urgency to urinate and blood in the urine. She also had some low back pain. No fever or nausea. A UA that day showed blood and 3+ leukocytes, and she was started on a 5 day course of Macrobid. Since then she feels tired and she has some slight burning on urinations, but the urgency and bleeding have stopped. She drinks plenty of water. She sees Urology for incontinence, and she has been wearing a pad for the past 2 years. This is the 3rd UTI she has had in that time span. Apparently the urgent care also sent ina urine for culture, but she has not heard back from that as yet.    Review of Systems  Constitutional: Positive for fatigue. Negative for fever.  Respiratory: Negative.   Cardiovascular: Negative.   Gastrointestinal: Negative.   Genitourinary: Positive for dysuria. Negative for flank pain, frequency, hematuria, pelvic pain and vaginal discharge.       Objective:   Physical Exam Constitutional:      Appearance: Normal appearance.  Cardiovascular:     Rate and Rhythm: Normal rate and regular rhythm.     Pulses: Normal pulses.     Heart sounds: Normal heart sounds.  Pulmonary:     Effort: Pulmonary effort is normal.     Breath sounds: Normal breath sounds.  Abdominal:     General: Abdomen is flat. Bowel sounds are normal. There is no distension.     Palpations: Abdomen is soft. There is no mass.     Tenderness: There is no abdominal tenderness. There is no right CVA tenderness, left CVA tenderness, guarding or rebound.     Hernia: No hernia is present.  Neurological:     Mental Status: She is alert.           Assessment & Plan:  Partially treated UTI. She seems to be responding well to the [place]. I asked her to finish out the full 5 days. She awaits the culture  report. At her request we will cover for any possible yeast infections with Diflucan. She will see Urology in a few months.  Alysia Penna, MD

## 2020-06-26 NOTE — Addendum Note (Signed)
Addended by: Agnes Lawrence on: 06/26/2020 10:08 AM   Modules accepted: Orders

## 2020-07-11 DIAGNOSIS — D0462 Carcinoma in situ of skin of left upper limb, including shoulder: Secondary | ICD-10-CM | POA: Diagnosis not present

## 2020-07-11 DIAGNOSIS — Z85828 Personal history of other malignant neoplasm of skin: Secondary | ICD-10-CM | POA: Diagnosis not present

## 2020-07-12 ENCOUNTER — Ambulatory Visit: Payer: Medicare Other | Admitting: Family Medicine

## 2020-08-08 DIAGNOSIS — Z85828 Personal history of other malignant neoplasm of skin: Secondary | ICD-10-CM | POA: Diagnosis not present

## 2020-08-08 DIAGNOSIS — L821 Other seborrheic keratosis: Secondary | ICD-10-CM | POA: Diagnosis not present

## 2020-08-08 DIAGNOSIS — L570 Actinic keratosis: Secondary | ICD-10-CM | POA: Diagnosis not present

## 2020-08-08 DIAGNOSIS — L820 Inflamed seborrheic keratosis: Secondary | ICD-10-CM | POA: Diagnosis not present

## 2020-08-08 DIAGNOSIS — D225 Melanocytic nevi of trunk: Secondary | ICD-10-CM | POA: Diagnosis not present

## 2020-08-09 DIAGNOSIS — N3946 Mixed incontinence: Secondary | ICD-10-CM | POA: Diagnosis not present

## 2020-08-09 DIAGNOSIS — R351 Nocturia: Secondary | ICD-10-CM | POA: Diagnosis not present

## 2020-08-10 DIAGNOSIS — M7061 Trochanteric bursitis, right hip: Secondary | ICD-10-CM | POA: Diagnosis not present

## 2020-08-23 DIAGNOSIS — R351 Nocturia: Secondary | ICD-10-CM | POA: Diagnosis not present

## 2020-09-07 DIAGNOSIS — M25571 Pain in right ankle and joints of right foot: Secondary | ICD-10-CM | POA: Diagnosis not present

## 2020-09-07 DIAGNOSIS — M542 Cervicalgia: Secondary | ICD-10-CM | POA: Diagnosis not present

## 2020-10-09 ENCOUNTER — Other Ambulatory Visit: Payer: Self-pay

## 2020-10-09 ENCOUNTER — Ambulatory Visit (INDEPENDENT_AMBULATORY_CARE_PROVIDER_SITE_OTHER): Payer: Medicare Other | Admitting: Family Medicine

## 2020-10-09 VITALS — BP 120/70 | HR 75 | Temp 97.5°F | Wt 149.4 lb

## 2020-10-09 DIAGNOSIS — K219 Gastro-esophageal reflux disease without esophagitis: Secondary | ICD-10-CM | POA: Diagnosis not present

## 2020-10-09 NOTE — Patient Instructions (Signed)
HOLD the Fosamax for one month  Double up the Omeprazole to 40  mg daily  May supplement with OTC Pepcid 20 mg once daily  Avoid eating within 3 hours of bedtime if possible  Avoid all mint products.

## 2020-10-09 NOTE — Progress Notes (Signed)
Established Patient Office Visit  Subjective:  [name], female    DOB: Jun 15, 1945  Age: 75 y.o. MRN: [id number]  CC:  Chief Complaint  Patient presents with   Gastroesophageal Reflux    Ongoing issue, seems to be becoming more frequent and more severe. Very painful,     HPI Mia Peterson presents for reflux symptoms especially over the past several weeks.  Her symptoms are worse at night.  She has substernal burning patient when she lays down at night.  She takes omeprazole 20 mg daily.  She is on Fosamax 70 mg weekly.  She has noted that acidic foods and coffee make this worse.  Denies any dysphagia.  She does have good appetite.  She has elevated head of her bed several inches.  No oropharyngeal pain.  No recent thrush.  Past Medical History:  Diagnosis Date   Arthritis    right knee and foot- OSteo    Asthma    treated in 20's and 30's   Cancer ([place])    skin- squamous basal   Cataract    Complication of anesthesia    very slow to awaken    GERD (gastroesophageal reflux disease)    prn Prilosec   Head injury, closed    10/26/15- years ago - 30ish   Shortness of breath dyspnea    with exertion   Vaso vagal episode    passed out 2 times, close to passing out 3 times   Vertigo    was treated with rehab March and April 2017, does exercises if she begins to have symptoms    Past Surgical History:  Procedure Laterality Date   ABDOMINAL HYSTERECTOMY  1993   menorrhagia.   BREAST ENHANCEMENT SURGERY  1978   CATARACT EXTRACTION, BILATERAL     COLONOSCOPY W/ POLYPECTOMY     COLONOSCOPY WITH PROPOFOL N/A 02/03/2017   Procedure: COLONOSCOPY WITH PROPOFOL;  Surgeon: Carol Ada, MD;  Location: [facility];  [facility]: Endoscopy;  Laterality: N/A;   KNEE ARTHROSCOPY W/ MENISCAL REPAIR Right    right knee replaceent      TOTAL KNEE ARTHROPLASTY Right 11/03/2015   Procedure: RIGHT TOTAL KNEE ARTHROPLASTY;  Surgeon: Dorna Leitz, MD;  Location: [place];  [place]:  Orthopedics;  Laterality: Right;   TUBAL LIGATION  1972   VARICOSE VEIN SURGERY  1978    Family History  Problem Relation Age of Onset   Heart disease Mother 31   Cancer Father 77       pancreatic    Social History   Socioeconomic History   Marital status: Married    Spouse name: Not on file   Number of children: Not on file   Years of education: Not on file   Highest education level: Not on file  Occupational History   Not on file  Tobacco Use   Smoking status: Former    Packs/day: 1.00    Years: 20.00    Pack years: 20.00    Types: Cigarettes    Start date: 35    Quit date: 03/24/1990    Years since quitting: 30.5   Smokeless tobacco: Never  Vaping Use   Vaping Use: Never used  Substance and Sexual Activity   Alcohol use: Yes    Alcohol/week: 6.0 standard drinks    Types: 6 Glasses of wine per week    Comment: ocasional use    Drug use: No   Sexual activity: Not on file  Other Topics  Concern   Not on file  Social History Narrative   Not on file   Social Determinants of Health   Financial Resource Strain: Low Risk    Difficulty of Paying Living Expenses: Not hard at all  Food Insecurity: No Food Insecurity   Worried About Charity fundraiser in the Last Year: Never true   [place] in the Last Year: Never true  Transportation Needs: No Transportation Needs   Lack of Transportation (Medical): No   Lack of Transportation (Non-Medical): No  Physical Activity: Sufficiently Active   Days of Exercise per Week: 6 days   Minutes of Exercise per Session: 60 min  Stress: No Stress Concern Present   Feeling of Stress : Not at all  Social Connections: Socially Integrated   Frequency of Communication with Friends and Family: More than three times a week   Frequency of Social Gatherings with Friends and Family: More than three times a week   Attends Religious Services: 1 to 4 times per year   Active Member of [employer] or Organizations: Yes   Attends Programme researcher, broadcasting/film/video: More than 4 times per year   Marital Status: Married  Human resources officer Violence: Not At Risk   Fear of Current or Ex-Partner: No   Emotionally Abused: No   Physically Abused: No   Sexually Abused: No    Outpatient Medications Prior to Visit  Medication Sig Dispense Refill   alendronate (FOSAMAX) 70 MG tablet Take 1 tablet by mouth every 7 days. Take with a full glass of water. 12 tablet 3   aspirin EC 81 MG tablet Take 81 mg by mouth daily.     atorvastatin (LIPITOR) 40 MG tablet TAKE 1 TABLET DAILY 90 tablet 3   Calcium Carb-Cholecalciferol (CALCIUM 600 + D PO) Take 2 tablets by mouth daily.     calcium-vitamin D (OSCAL WITH D) 500-200 MG-UNIT TABS tablet Take by mouth.     cycloSPORINE (RESTASIS) 0.05 % ophthalmic emulsion Place 1 drop into both eyes 2 (two) times daily.     Flaxseed, Linseed, (FLAX SEED OIL) 1000 MG CAPS Take 1,000 mg by mouth daily.     Ginger, Zingiber officinalis, (GINGER ROOT) 550 MG CAPS Take 550 mg by mouth daily.     Multiple Vitamins-Minerals (ONE DAILY WOMENS 50+ PO) Take 1 tablet by mouth daily.     omeprazole (PRILOSEC) 20 MG capsule Take by mouth.     oxybutynin (DITROPAN-XL) 5 MG 24 hr tablet Take 5 mg by mouth at bedtime.     POTASSIUM PO Take 1 tablet by mouth daily.     TURMERIC PO Take 400 mg by mouth 2 (two) times daily.      fluconazole (DIFLUCAN) 150 MG tablet Take 1 tablet (150 mg total) by mouth daily. 1 tablet 2   No facility-administered medications prior to visit.    Allergies  Allergen Reactions   Other Rash    Derald Macleod cooling pads, severe itching   Adhesive [Tape] Hives    Use paper tape only    ROS Review of Systems  Constitutional:  Negative for appetite change and unexpected weight change.  Respiratory:  Negative for shortness of breath.   Cardiovascular:  Negative for chest pain.  Gastrointestinal:  Negative for blood in stool, nausea and vomiting.     Objective:    Physical Exam Vitals  reviewed.  Constitutional:      Appearance: Normal appearance.  Cardiovascular:     Rate  and Rhythm: Normal rate and regular rhythm.  Pulmonary:     Effort: Pulmonary effort is normal.     Breath sounds: Normal breath sounds.  Abdominal:     Palpations: Abdomen is soft.     Tenderness: There is no abdominal tenderness.  Neurological:     Mental Status: She is alert.    BP 120/70 (BP Location: Left Arm, Patient Position: Sitting, Cuff Size: Normal)   Pulse 75   Temp (!) 97.5 F (36.4 C) (Oral)   Wt 149 lb 6.4 oz (67.8 kg)   SpO2 98%   BMI 23.40 kg/m  Wt Readings from Last 3 Encounters:  10/09/20 149 lb 6.4 oz (67.8 kg)  06/26/20 152 lb 12.8 oz (69.3 kg)  03/21/20 150 lb (68 kg)     Health Maintenance Due  Topic Date Due   Zoster Vaccines- Shingrix (1 of 2) Never done   COVID-19 Vaccine (4 - Booster for Pfizer series) 03/22/2020   INFLUENZA VACCINE  09/25/2020    There are no preventive care reminders to display for this patient.  Lab Results  Component Value Date   TSH 1.59 10/30/2018   Lab Results  Component Value Date   WBC 4.4 10/30/2018   HGB 13.5 10/30/2018   HCT 39.7 10/30/2018   MCV 88.3 10/30/2018   PLT 278.0 10/30/2018   Lab Results  Component Value Date   NA 137 10/30/2018   K 4.4 10/30/2018   CO2 29 10/30/2018   GLUCOSE 85 10/30/2018   BUN 13 10/30/2018   CREATININE 0.69 10/30/2018   BILITOT 0.5 03/21/2020   ALKPHOS 53 03/21/2020   AST 30 03/21/2020   ALT 20 03/21/2020   PROT 6.5 03/21/2020   ALBUMIN 3.9 03/21/2020   CALCIUM 9.1 10/30/2018   ANIONGAP 7 02/01/2017   GFR 83.27 10/30/2018   Lab Results  Component Value Date   CHOL 133 03/21/2020   Lab Results  Component Value Date   HDL 69.50 03/21/2020   Lab Results  Component Value Date   LDLCALC 55 03/21/2020   Lab Results  Component Value Date   TRIG 39.0 03/21/2020   Lab Results  Component Value Date   CHOLHDL 2 03/21/2020   No results found for: HGBA1C     Assessment & Plan:   GERD symptoms.  Worsening over the past several weeks.  She does have risk factor of Fosamax.  Does not any red flag symptoms such as substantial weight loss or any change in appetite.  No dysphagia.  -GERD factors discussed -Avoid eating within a few hours of bedtime -Hold Fosamax for the next month -Double up Prilosec to 40 mg daily -May supplement as needed with Pepcid 20 mg daily -If symptoms not fully resolved in 3 to 4 weeks be in touch and we will set up GI referral.  If symptoms fully resolving over the next couple weeks in 1 month she will resume her Fosamax  No orders of the defined types were placed in this encounter.   Follow-up: No follow-ups on file.    Carolann Littler, MD

## 2020-10-10 DIAGNOSIS — M7061 Trochanteric bursitis, right hip: Secondary | ICD-10-CM | POA: Diagnosis not present

## 2020-10-16 DIAGNOSIS — M7061 Trochanteric bursitis, right hip: Secondary | ICD-10-CM | POA: Diagnosis not present

## 2020-10-16 DIAGNOSIS — M7631 Iliotibial band syndrome, right leg: Secondary | ICD-10-CM | POA: Diagnosis not present

## 2020-10-19 DIAGNOSIS — M7631 Iliotibial band syndrome, right leg: Secondary | ICD-10-CM | POA: Diagnosis not present

## 2020-10-19 DIAGNOSIS — M7061 Trochanteric bursitis, right hip: Secondary | ICD-10-CM | POA: Diagnosis not present

## 2020-10-23 DIAGNOSIS — M7631 Iliotibial band syndrome, right leg: Secondary | ICD-10-CM | POA: Diagnosis not present

## 2020-10-23 DIAGNOSIS — M7061 Trochanteric bursitis, right hip: Secondary | ICD-10-CM | POA: Diagnosis not present

## 2020-10-25 DIAGNOSIS — M7631 Iliotibial band syndrome, right leg: Secondary | ICD-10-CM | POA: Diagnosis not present

## 2020-10-25 DIAGNOSIS — M7061 Trochanteric bursitis, right hip: Secondary | ICD-10-CM | POA: Diagnosis not present

## 2020-11-02 DIAGNOSIS — M7061 Trochanteric bursitis, right hip: Secondary | ICD-10-CM | POA: Diagnosis not present

## 2020-11-02 DIAGNOSIS — M7631 Iliotibial band syndrome, right leg: Secondary | ICD-10-CM | POA: Diagnosis not present

## 2020-11-05 DIAGNOSIS — Z23 Encounter for immunization: Secondary | ICD-10-CM | POA: Diagnosis not present

## 2020-11-06 ENCOUNTER — Encounter: Payer: Self-pay | Admitting: Family Medicine

## 2020-11-06 DIAGNOSIS — M7061 Trochanteric bursitis, right hip: Secondary | ICD-10-CM | POA: Diagnosis not present

## 2020-11-06 DIAGNOSIS — M7631 Iliotibial band syndrome, right leg: Secondary | ICD-10-CM | POA: Diagnosis not present

## 2020-11-09 DIAGNOSIS — M7631 Iliotibial band syndrome, right leg: Secondary | ICD-10-CM | POA: Diagnosis not present

## 2020-11-09 DIAGNOSIS — M7061 Trochanteric bursitis, right hip: Secondary | ICD-10-CM | POA: Diagnosis not present

## 2020-11-13 DIAGNOSIS — M7061 Trochanteric bursitis, right hip: Secondary | ICD-10-CM | POA: Diagnosis not present

## 2020-11-13 DIAGNOSIS — M7631 Iliotibial band syndrome, right leg: Secondary | ICD-10-CM | POA: Diagnosis not present

## 2020-11-16 DIAGNOSIS — M7631 Iliotibial band syndrome, right leg: Secondary | ICD-10-CM | POA: Diagnosis not present

## 2020-11-16 DIAGNOSIS — M7061 Trochanteric bursitis, right hip: Secondary | ICD-10-CM | POA: Diagnosis not present

## 2020-12-06 ENCOUNTER — Encounter: Payer: Self-pay | Admitting: Family Medicine

## 2020-12-07 ENCOUNTER — Encounter: Payer: Self-pay | Admitting: Family Medicine

## 2020-12-11 DIAGNOSIS — M25551 Pain in right hip: Secondary | ICD-10-CM | POA: Diagnosis not present

## 2021-01-09 ENCOUNTER — Telehealth: Payer: Self-pay | Admitting: Internal Medicine

## 2021-01-09 NOTE — Telephone Encounter (Signed)
Patient scheduled 01/26/21 with Dr.Young for sleep consult.   I spoke with Patient and she stated she has never had a sleep study or used a cpap. Patient stated she has increased fatigue and daytime naps.

## 2021-01-15 DIAGNOSIS — M7061 Trochanteric bursitis, right hip: Secondary | ICD-10-CM | POA: Diagnosis not present

## 2021-01-15 DIAGNOSIS — M25451 Effusion, right hip: Secondary | ICD-10-CM | POA: Diagnosis not present

## 2021-01-15 DIAGNOSIS — M25551 Pain in right hip: Secondary | ICD-10-CM | POA: Diagnosis not present

## 2021-01-15 DIAGNOSIS — M1611 Unilateral primary osteoarthritis, right hip: Secondary | ICD-10-CM | POA: Diagnosis not present

## 2021-01-22 DIAGNOSIS — M25551 Pain in right hip: Secondary | ICD-10-CM | POA: Diagnosis not present

## 2021-01-25 NOTE — Progress Notes (Signed)
01/26/21- 65 yoF former smoker(20 pk yrs) for sleep evaluation self referred Husband Mia Peterson our pt, uses [facility] for CPAP. Retired Professor- educated counselors Medical problem list includes TIA, Asthma, GERD, Ischemic Colitis, Diverticulosis, Rosacea,  Epworth score-12 Body weight today-152 lbs Covid vax-4 Phizer Flu vax-had She describes chronic daytime sleepiness.  Worse in the last year or 2.  As a child she was told she needed to go to bed with younger children because "she needed more sleep than other people".  She denies cataplexy or sleep paralysis.  No sleep medications.  1 or 2 cups of morning coffee.  No alcohol.  Thyroid testing normal. She has been told a few times of snoring.  Falls asleep easily with bedtime between 830 and 9:30 PM but wakes frequently during the night, sometimes staying awake before finally up between 5 and 6 AM.  Not told of witnessed apneas or complex parasomnias.  No similar family history.  Prior to Admission medications   Medication Sig Start Date End Date Taking? Authorizing Provider  alendronate (FOSAMAX) 70 MG tablet Take 1 tablet by mouth every 7 days. Take with a full glass of water. 05/23/20  Yes Burchette, [name] Sierras, MD  aspirin EC 81 MG tablet Take 81 mg by mouth daily.   Yes [provider]  atorvastatin (LIPITOR) 40 MG tablet TAKE 1 TABLET DAILY 03/21/20  Yes Burchette, [name] Sierras, MD  Calcium Carb-Cholecalciferol (CALCIUM 600 + D PO) Take 2 tablets by mouth daily.   Yes [provider]  calcium-vitamin D (OSCAL WITH D) 500-200 MG-UNIT TABS tablet Take by mouth.   Yes [provider]  cycloSPORINE (RESTASIS) 0.05 % ophthalmic emulsion Place 1 drop into both eyes 2 (two) times daily.   Yes [provider]  Flaxseed, Linseed, (FLAX SEED OIL) 1000 MG CAPS Take 1,000 mg by mouth daily.   Yes [provider]  Ginger, Zingiber officinalis, (GINGER ROOT) 550 MG CAPS Take 550 mg by mouth daily.   Yes  [provider]  Multiple Vitamins-Minerals (ONE DAILY WOMENS 50+ PO) Take 1 tablet by mouth daily.   Yes [provider]  omeprazole (PRILOSEC) 20 MG capsule Take by mouth.   Yes [provider]  oxybutynin (DITROPAN-XL) 5 MG 24 hr tablet Take 5 mg by mouth at bedtime.   Yes [provider]  POTASSIUM PO Take 1 tablet by mouth daily.   Yes [provider]  TURMERIC PO Take 400 mg by mouth 2 (two) times daily.    Yes [provider]   Past Medical History:  Diagnosis Date   Arthritis    right knee and foot- OSteo    Asthma    treated in 20's and 43's   Cancer ([place])    skin- squamous basal   Cataract    Complication of anesthesia    very slow to awaken    GERD (gastroesophageal reflux disease)    prn Prilosec   Head injury, closed    10/26/15- years ago - 30ish   Shortness of breath dyspnea    with exertion   Vaso vagal episode    passed out 2 times, close to passing out 3 times   Vertigo    was treated with rehab March and April 2017, does exercises if she begins to have symptoms   Past Surgical History:  Procedure Laterality Date   ABDOMINAL HYSTERECTOMY  1993   menorrhagia.   BREAST ENHANCEMENT SURGERY  1978   CATARACT EXTRACTION, BILATERAL  COLONOSCOPY W/ POLYPECTOMY     COLONOSCOPY WITH PROPOFOL N/A 02/03/2017   Procedure: COLONOSCOPY WITH PROPOFOL;  Surgeon: Carol Ada, MD;  Location: [facility];  Service: Endoscopy;  Laterality: N/A;   KNEE ARTHROSCOPY W/ MENISCAL REPAIR Right    right knee replaceent      TOTAL KNEE ARTHROPLASTY Right 11/03/2015   Procedure: RIGHT TOTAL KNEE ARTHROPLASTY;  Surgeon: Dorna Leitz, MD;  Location: [place];  Service: Orthopedics;  Laterality: Right;   TUBAL LIGATION  1972   VARICOSE VEIN SURGERY  1978   Family History  Problem Relation Age of Onset   Heart disease Mother 37   Cancer Father 70       pancreatic   Social History   Socioeconomic History   Marital status:  Married    Spouse name: Not on file   Number of children: Not on file   Years of education: Not on file   Highest education level: Not on file  Occupational History   Not on file  Tobacco Use   Smoking status: Former    Packs/day: 1.00    Years: 20.00    Pack years: 20.00    Types: Cigarettes    Start date: 3    Quit date: 03/24/1990    Years since quitting: 30.8   Smokeless tobacco: Never  Vaping Use   Vaping Use: Never used  Substance and Sexual Activity   Alcohol use: Yes    Alcohol/week: 6.0 standard drinks    Types: 6 Glasses of wine per week    Comment: ocasional use    Drug use: No   Sexual activity: Not on file  Other Topics Concern   Not on file  Social History Narrative   Not on file   Social Determinants of Health   Financial Resource Strain: Low Risk    Difficulty of Paying Living Expenses: Not hard at all  Food Insecurity: No Food Insecurity   Worried About Charity fundraiser in the Last Year: Never true   Ran Out of Food in the Last Year: Never true  Transportation Needs: No Transportation Needs   Lack of Transportation (Medical): No   Lack of Transportation (Non-Medical): No  Physical Activity: Sufficiently Active   Days of Exercise per Week: 6 days   Minutes of Exercise per Session: 60 min  Stress: No Stress Concern Present   Feeling of Stress : Not at all  Social Connections: Socially Integrated   Frequency of Communication with Friends and Family: More than three times a week   Frequency of Social Gatherings with Friends and Family: More than three times a week   Attends Religious Services: 1 to 4 times per year   Active Member of [employer] or Organizations: Yes   Attends Music therapist: More than 4 times per year   Marital Status: Married  Human resources officer Violence: Not At Risk   Fear of Current or Ex-Partner: No   Emotionally Abused: No   Physically Abused: No   Sexually Abused: No   ROS-see HPI   + =  positive Constitutional:    weight loss, night sweats, fevers, chills, fatigue, lassitude. HEENT:    headaches, difficulty swallowing, tooth/dental problems, sore throat,       +sneezing, +itching, ear ache, nasal congestion, post nasal drip, snoring CV:    chest pain, orthopnea, PND, swelling in lower extremities, anasarca,  dizziness, palpitations Resp:   shortness of breath with exertion or at rest.                productive cough,   non-productive cough, coughing up of blood.              change in color of mucus.  wheezing.   Skin:    +rosacea GI:  +heartburn, indigestion, +abdominal pain, nausea, vomiting, diarrhea,                 change in bowel habits, loss of appetite GU: dysuria, change in color of urine, no urgency or frequency.   flank pain. MS:   +joint pain, stiffness, decreased range of motion, back pain. Neuro-     nothing unusual Psych:  change in mood or affect.  depression or anxiety.   memory loss.  OBJ- Physical Exam General- Alert, Oriented, Affect-appropriate, Distress- none acute, not obese Skin- rash-none, lesions- none, excoriation- none Lymphadenopathy- none Head- atraumatic            Eyes- Gross vision intact, PERRLA, conjunctivae and secretions clear            Ears- Hearing, canals-normal            Nose- Clear, no-Septal dev, mucus, polyps, erosion, perforation             Throat- Mallampati III, mucosa clear , drainage- none, tonsils- atrophic, _+ teeth Neck- flexible , trachea midline, no stridor , thyroid nl, carotid no bruit Chest - symmetrical excursion , unlabored           Heart/CV- RRR , no murmur , no gallop  , no rub, nl s1 s2                           - JVD- none , edema- none, stasis changes- none, varices- none           Lung- clear to P&A, wheeze- none, cough- none , dullness-none, rub- none           Chest wall-  Abd-  Br/ Gen/ Rectal- Not done, not indicated Extrem- cyanosis- none, clubbing, none,  atrophy- none, strength- nl Neuro- grossly intact to observation

## 2021-01-26 ENCOUNTER — Other Ambulatory Visit: Payer: Self-pay

## 2021-01-26 ENCOUNTER — Ambulatory Visit (INDEPENDENT_AMBULATORY_CARE_PROVIDER_SITE_OTHER): Payer: Medicare Other | Admitting: Internal Medicine

## 2021-01-26 ENCOUNTER — Encounter: Payer: Self-pay | Admitting: Internal Medicine

## 2021-01-26 DIAGNOSIS — R400 Somnolence: Secondary | ICD-10-CM | POA: Diagnosis not present

## 2021-01-26 NOTE — Patient Instructions (Signed)
Order- schedule home sleep test    dx daytime sleepiness  Please call us about 2 weeks after your sleep study for results and recommendation

## 2021-01-26 NOTE — Addendum Note (Signed)
Addended by: Elby Beck R on: 01/26/2021 11:25 AM   Modules accepted: Orders

## 2021-01-26 NOTE — Assessment & Plan Note (Signed)
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  Unclear if depression has been involved.  We will first exclude sleep apnea then consider if a therapeutic trial of a stimulant would be appropriate. Plan - HST

## 2021-02-26 DIAGNOSIS — R42 Dizziness and giddiness: Secondary | ICD-10-CM | POA: Diagnosis not present

## 2021-02-26 DIAGNOSIS — K219 Gastro-esophageal reflux disease without esophagitis: Secondary | ICD-10-CM | POA: Diagnosis not present

## 2021-02-26 DIAGNOSIS — R0689 Other abnormalities of breathing: Secondary | ICD-10-CM | POA: Diagnosis not present

## 2021-02-26 DIAGNOSIS — E162 Hypoglycemia, unspecified: Secondary | ICD-10-CM | POA: Diagnosis not present

## 2021-02-26 DIAGNOSIS — R112 Nausea with vomiting, unspecified: Secondary | ICD-10-CM | POA: Diagnosis not present

## 2021-02-26 DIAGNOSIS — R0902 Hypoxemia: Secondary | ICD-10-CM | POA: Diagnosis not present

## 2021-02-26 DIAGNOSIS — Z87891 Personal history of nicotine dependence: Secondary | ICD-10-CM | POA: Diagnosis not present

## 2021-02-26 DIAGNOSIS — Z7982 Long term (current) use of aspirin: Secondary | ICD-10-CM | POA: Diagnosis not present

## 2021-02-26 DIAGNOSIS — Z79899 Other long term (current) drug therapy: Secondary | ICD-10-CM | POA: Diagnosis not present

## 2021-02-26 DIAGNOSIS — E161 Other hypoglycemia: Secondary | ICD-10-CM | POA: Diagnosis not present

## 2021-02-26 DIAGNOSIS — Z743 Need for continuous supervision: Secondary | ICD-10-CM | POA: Diagnosis not present

## 2021-02-27 ENCOUNTER — Encounter: Payer: Self-pay | Admitting: Family Medicine

## 2021-02-27 DIAGNOSIS — D485 Neoplasm of uncertain behavior of skin: Secondary | ICD-10-CM | POA: Diagnosis not present

## 2021-02-27 DIAGNOSIS — C44319 Basal cell carcinoma of skin of other parts of face: Secondary | ICD-10-CM | POA: Diagnosis not present

## 2021-02-27 DIAGNOSIS — L720 Epidermal cyst: Secondary | ICD-10-CM | POA: Diagnosis not present

## 2021-02-27 DIAGNOSIS — L821 Other seborrheic keratosis: Secondary | ICD-10-CM | POA: Diagnosis not present

## 2021-02-27 DIAGNOSIS — L859 Epidermal thickening, unspecified: Secondary | ICD-10-CM | POA: Diagnosis not present

## 2021-02-27 DIAGNOSIS — D1801 Hemangioma of skin and subcutaneous tissue: Secondary | ICD-10-CM | POA: Diagnosis not present

## 2021-02-27 DIAGNOSIS — L814 Other melanin hyperpigmentation: Secondary | ICD-10-CM | POA: Diagnosis not present

## 2021-02-27 DIAGNOSIS — Z85828 Personal history of other malignant neoplasm of skin: Secondary | ICD-10-CM | POA: Diagnosis not present

## 2021-02-27 DIAGNOSIS — L570 Actinic keratosis: Secondary | ICD-10-CM | POA: Diagnosis not present

## 2021-02-27 DIAGNOSIS — D2272 Melanocytic nevi of left lower limb, including hip: Secondary | ICD-10-CM | POA: Diagnosis not present

## 2021-02-27 DIAGNOSIS — D225 Melanocytic nevi of trunk: Secondary | ICD-10-CM | POA: Diagnosis not present

## 2021-03-05 ENCOUNTER — Ambulatory Visit (INDEPENDENT_AMBULATORY_CARE_PROVIDER_SITE_OTHER): Payer: Medicare Other | Admitting: Family Medicine

## 2021-03-05 VITALS — BP 120/64 | HR 70 | Temp 97.5°F | Wt 150.5 lb

## 2021-03-05 DIAGNOSIS — R42 Dizziness and giddiness: Secondary | ICD-10-CM | POA: Diagnosis not present

## 2021-03-05 MED ORDER — ONDANSETRON 4 MG PO TBDP: 4.0000 mg | ORAL_TABLET | Freq: Three times a day (TID) | ORAL | 0 refills | Status: DC | PRN

## 2021-03-05 NOTE — Progress Notes (Signed)
Established Patient Office Visit  Subjective:  [name], female    DOB: 01/18/1946  Age: 76 y.o. MRN: [id number]  CC:  Chief Complaint  Patient presents with   Hospitalization Follow-up    HPI Mia Peterson presents for ER follow-up.  She has history of recurrent vertigo.  She had severe recurrence on 2 January.  She was getting a pedicure and apparently during the pedicure had acute onset of nausea, vomiting, and classic vertigo symptoms.  Very similar symptoms in the past.  No acute hearing loss.  No focal weakness.  No confusion.  No chest pains.  No dyspnea.  EMS was actually summoned and she was taken to [facility] ER facility for further evaluation.  Her vital signs were stable.  She had lab work including CBC and CMP which were unremarkable.  CT head without contrast revealed no acute intracranial abnormalities.  EKG showed no acute changes  She was prescribed meclizine and diazepam.  She had significant sedation from these and took these for couple days but then her symptoms had resolved by the next day.  She had no recurrence since then.  Feels well at this time.  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   BREAST ENHANCEMENT SURGERY  1978   CATARACT EXTRACTION,  BILATERAL     COLONOSCOPY W/ POLYPECTOMY     COLONOSCOPY WITH PROPOFOL N/A 02/03/2017   Procedure: COLONOSCOPY WITH PROPOFOL;  Surgeon: Carol Ada, MD;  Location: [facility];  Service: Endoscopy;  Laterality: N/A;   KNEE ARTHROSCOPY W/ MENISCAL REPAIR Right    right knee replaceent      TOTAL KNEE ARTHROPLASTY Right 11/03/2015   Procedure: RIGHT TOTAL KNEE ARTHROPLASTY;  Surgeon: Dorna Leitz, MD;  Location: [facility];  Service: Orthopedics;  Laterality: Right;   TUBAL LIGATION  1972   VARICOSE VEIN SURGERY  1978    Family History  Problem Relation Age of Onset   Heart disease Mother 55   Cancer Father 63       pancreatic    Social History   Socioeconomic History   Marital status: Married    Spouse name: Not on file   Number of children: Not on file   Years of education: Not on file   Highest education level: Not on file  Occupational History   Not on file  Tobacco Use   Smoking status: Former    Packs/day: 1.00    Years: 20.00    Pack years: 20.00    Types: Cigarettes    Start date: 24    Quit date: 03/24/1990  Years since quitting: 30.9   Smokeless tobacco: Never  Vaping Use   Vaping Use: Never used  Substance and Sexual Activity   Alcohol use: Yes    Alcohol/week: 6.0 standard drinks    Types: 6 Glasses of wine per week    Comment: ocasional use    Drug use: No   Sexual activity: Not on file  Other Topics Concern   Not on file  Social History Narrative   Not on file   Social Determinants of Health   Financial Resource Strain: Low Risk    Difficulty of Paying Living Expenses: Not hard at all  Food Insecurity: No Food Insecurity   Worried About Charity fundraiser in the Last Year: Never true   Ran Out of Food in the Last Year: Never true  Transportation Needs: No Transportation Needs   Lack of Transportation (Medical): No   Lack of Transportation (Non-Medical): No  Physical Activity: Sufficiently Active   Days of Exercise per Week: 6 days   Minutes  of Exercise per Session: 60 min  Stress: No Stress Concern Present   Feeling of Stress : Not at all  Social Connections: Socially Integrated   Frequency of Communication with Friends and Family: More than three times a week   Frequency of Social Gatherings with Friends and Family: More than three times a week   Attends Religious Services: 1 to 4 times per year   Active Member of [employer] or Organizations: Yes   Attends Music therapist: More than 4 times per year   Marital Status: Married  Human resources officer Violence: Not At Risk   Fear of Current or Ex-Partner: No   Emotionally Abused: No   Physically Abused: No   Sexually Abused: No    Outpatient Medications Prior to Visit  Medication Sig Dispense Refill   alendronate (FOSAMAX) 70 MG tablet Take 1 tablet by mouth every 7 days. Take with a full glass of water. 12 tablet 3   aspirin EC 81 MG tablet Take 81 mg by mouth daily.     atorvastatin (LIPITOR) 40 MG tablet TAKE 1 TABLET DAILY 90 tablet 3   Calcium Carb-Cholecalciferol (CALCIUM 600 + D PO) Take 2 tablets by mouth daily.     calcium-vitamin D (OSCAL WITH D) 500-200 MG-UNIT TABS tablet Take by mouth.     cycloSPORINE (RESTASIS) 0.05 % ophthalmic emulsion Place 1 drop into both eyes 2 (two) times daily.     Flaxseed, Linseed, (FLAX SEED OIL) 1000 MG CAPS Take 1,000 mg by mouth daily.     Ginger, Zingiber officinalis, (GINGER ROOT) 550 MG CAPS Take 550 mg by mouth daily.     Multiple Vitamins-Minerals (ONE DAILY WOMENS 50+ PO) Take 1 tablet by mouth daily.     omeprazole (PRILOSEC) 20 MG capsule Take by mouth.     oxybutynin (DITROPAN-XL) 5 MG 24 hr tablet Take 5 mg by mouth at bedtime.     POTASSIUM PO Take 1 tablet by mouth daily.     TURMERIC PO Take 400 mg by mouth 2 (two) times daily.      No facility-administered medications prior to visit.    Allergies  Allergen Reactions   Other Rash    Derald Macleod cooling pads, severe itching   Adhesive [Tape] Hives    Use  paper tape only    ROS Review of Systems  Constitutional:  Negative for appetite change, chills, fever and unexpected weight change.  HENT:  Negative for hearing  loss, sinus pressure, sinus pain and trouble swallowing.   Eyes:  Negative for visual disturbance.  Respiratory:  Negative for cough and shortness of breath.   Cardiovascular:  Negative for chest pain.  Gastrointestinal:  Negative for abdominal pain.  Neurological:  Negative for headaches.       See HPI     Objective:    Physical Exam Vitals reviewed.  Constitutional:      Appearance: Normal appearance.  HENT:     Right Ear: Tympanic membrane normal.     Left Ear: Tympanic membrane normal.  Cardiovascular:     Rate and Rhythm: Normal rate and regular rhythm.  Pulmonary:     Effort: Pulmonary effort is normal.     Breath sounds: Normal breath sounds. No wheezing or rales.  Musculoskeletal:     Cervical back: Neck supple.     Right lower leg: No edema.     Left lower leg: No edema.  Lymphadenopathy:     Cervical: No cervical adenopathy.  Neurological:     General: No focal deficit present.     Mental Status: She is alert and oriented to person, place, and time.     Cranial Nerves: No cranial nerve deficit.     Motor: No weakness.     Coordination: Coordination normal.     Gait: Gait normal.    BP 120/64 (BP Location: Left Arm, Patient Position: Sitting, Cuff Size: Normal)    Pulse 70    Temp (!) 97.5 F (36.4 C) (Oral)    Wt 150 lb 8 oz (68.3 kg)    SpO2 99%    BMI 23.57 kg/m  Wt Readings from Last 3 Encounters:  03/05/21 150 lb 8 oz (68.3 kg)  01/26/21 152 lb 6.4 oz (69.1 kg)  10/09/20 149 lb 6.4 oz (67.8 kg)     Health Maintenance Due  Topic Date Due   Zoster Vaccines- Shingrix (1 of 2) Never done    There are no preventive care reminders to display for this patient.  Lab Results  Component Value Date   TSH 1.59 10/30/2018   Lab Results  Component Value Date   WBC 4.4 10/30/2018   HGB 13.5  10/30/2018   HCT 39.7 10/30/2018   MCV 88.3 10/30/2018   PLT 278.0 10/30/2018   Lab Results  Component Value Date   NA 137 10/30/2018   K 4.4 10/30/2018   CO2 29 10/30/2018   GLUCOSE 85 10/30/2018   BUN 13 10/30/2018   CREATININE 0.69 10/30/2018   BILITOT 0.5 03/21/2020   ALKPHOS 53 03/21/2020   AST 30 03/21/2020   ALT 20 03/21/2020   PROT 6.5 03/21/2020   ALBUMIN 3.9 03/21/2020   CALCIUM 9.1 10/30/2018   ANIONGAP 7 02/01/2017   GFR 83.27 10/30/2018   Lab Results  Component Value Date   CHOL 133 03/21/2020   Lab Results  Component Value Date   HDL 69.50 03/21/2020   Lab Results  Component Value Date   LDLCALC 55 03/21/2020   Lab Results  Component Value Date   TRIG 39.0 03/21/2020   Lab Results  Component Value Date   CHOLHDL 2 03/21/2020   No results found for: HGBA1C    Assessment & Plan:   Recurrent vertigo.   She has no symptoms at this time.  Recent work-up as above reviewed and unremarkable.  -We sent in prescription for Zofran for any future episodes to take 1 every 8 hours as needed for nausea and vomiting. -We  reviewed red flags for more worrisome vertigo and handout given -We spent 30 minutes reviewing recent events, recent ER notes and studies and discussing vertigo in general -She also has some leftover diazepam to use 1 every 12 hours as needed for future recurrence.  She has been instructed previously with Epley maneuvers  Meds ordered this encounter  Medications   ondansetron (ZOFRAN-ODT) 4 MG disintegrating tablet    Sig: Take 1 tablet (4 mg total) by mouth every 8 (eight) hours as needed for nausea or vomiting.    Dispense:  15 tablet    Refill:  0    Follow-up: No follow-ups on file.    Carolann Littler, MD

## 2021-03-06 DIAGNOSIS — M1611 Unilateral primary osteoarthritis, right hip: Secondary | ICD-10-CM | POA: Diagnosis not present

## 2021-03-07 ENCOUNTER — Other Ambulatory Visit: Payer: Self-pay | Admitting: Family Medicine

## 2021-03-27 ENCOUNTER — Other Ambulatory Visit: Payer: Self-pay

## 2021-03-27 ENCOUNTER — Ambulatory Visit: Payer: Medicare Other

## 2021-03-27 DIAGNOSIS — Z1231 Encounter for screening mammogram for malignant neoplasm of breast: Secondary | ICD-10-CM | POA: Diagnosis not present

## 2021-03-27 DIAGNOSIS — R400 Somnolence: Secondary | ICD-10-CM

## 2021-03-27 DIAGNOSIS — G4733 Obstructive sleep apnea (adult) (pediatric): Secondary | ICD-10-CM

## 2021-03-27 LAB — HM MAMMOGRAPHY: HM Mammogram: ABNORMAL — AB (ref 0–4)

## 2021-03-28 ENCOUNTER — Encounter: Payer: Self-pay | Admitting: Family Medicine

## 2021-03-28 DIAGNOSIS — H5213 Myopia, bilateral: Secondary | ICD-10-CM | POA: Diagnosis not present

## 2021-03-28 DIAGNOSIS — Z961 Presence of intraocular lens: Secondary | ICD-10-CM | POA: Diagnosis not present

## 2021-03-29 DIAGNOSIS — C44319 Basal cell carcinoma of skin of other parts of face: Secondary | ICD-10-CM | POA: Diagnosis not present

## 2021-03-29 DIAGNOSIS — Z85828 Personal history of other malignant neoplasm of skin: Secondary | ICD-10-CM | POA: Diagnosis not present

## 2021-03-30 ENCOUNTER — Encounter: Payer: Self-pay | Admitting: Family Medicine

## 2021-03-30 DIAGNOSIS — G4733 Obstructive sleep apnea (adult) (pediatric): Secondary | ICD-10-CM | POA: Diagnosis not present

## 2021-04-03 DIAGNOSIS — R922 Inconclusive mammogram: Secondary | ICD-10-CM | POA: Diagnosis not present

## 2021-04-03 DIAGNOSIS — R928 Other abnormal and inconclusive findings on diagnostic imaging of breast: Secondary | ICD-10-CM | POA: Diagnosis not present

## 2021-04-03 LAB — HM MAMMOGRAPHY

## 2021-04-04 ENCOUNTER — Encounter: Payer: Self-pay | Admitting: Family Medicine

## 2021-04-13 ENCOUNTER — Telehealth: Payer: Self-pay | Admitting: Internal Medicine

## 2021-04-13 DIAGNOSIS — G4733 Obstructive sleep apnea (adult) (pediatric): Secondary | ICD-10-CM

## 2021-04-13 NOTE — Telephone Encounter (Signed)
Called patient and she is wanting the results of her home sleep study if they are available. I told patient that I would message Dr young to see if the results are available yet and that a nurse would give her a call back with the results.  Dr Annamaria Boots please advise

## 2021-04-15 NOTE — Telephone Encounter (Signed)
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  I suggest we order new DME and new CPAP auto 5-15, mask of choice, humidifier, supplies, AirView/ card. A fitted oral appliance would bee an alternative to CPAP and if she wishes, leet me know and I will refer her to someone who can talk with her more about that option. She will need a f/u with me 31-90 days after getting her machine.  Once her sleep apnea is under control we can reassess her complaint of daytime sleepiness.

## 2021-04-16 NOTE — Telephone Encounter (Signed)
Yes please refer to either Dr Augustina Mood or Dr Oneal Grout for oral appliance

## 2021-04-16 NOTE — Telephone Encounter (Signed)
Called and spoke with patient. Gave her the option of Dr. Toy Cookey or Dr. Ron Parker. She would like to establish with Dr. Toy Cookey. I provided her with the Dr. Corky Sing contact information.   Referral has been placed.   Nothing further needed at time of call.

## 2021-04-16 NOTE — Telephone Encounter (Signed)
Called and spoke with patient. She verbalized understanding. She would like to proceed with the oral appliance and would appreciate a referral for this. She is aware that I will let Dr. Annamaria Boots know. Provided her with the names of Dr. Toy Cookey and Dr. Ron Parker.   Dr. Annamaria Boots, please advise if you are ok with [place] placing the oral appliance referral. Thanks!

## 2021-04-17 ENCOUNTER — Other Ambulatory Visit: Payer: Self-pay | Admitting: Radiology

## 2021-04-17 DIAGNOSIS — R921 Mammographic calcification found on diagnostic imaging of breast: Secondary | ICD-10-CM | POA: Diagnosis not present

## 2021-04-17 DIAGNOSIS — N6489 Other specified disorders of breast: Secondary | ICD-10-CM | POA: Diagnosis not present

## 2021-04-20 ENCOUNTER — Encounter: Payer: Self-pay | Admitting: Family Medicine

## 2021-05-02 ENCOUNTER — Telehealth: Payer: Self-pay | Admitting: Family Medicine

## 2021-05-02 NOTE — Telephone Encounter (Signed)
Left message for patient to call back and schedule Medicare Annual Wellness Visit (AWV) either virtually or in office. Left  my Herbie Drape number [phone] ? ? ?Last AWV ;05/15/20 ? please schedule at anytime with [facility] Nurse Health Advisor 1 or 2 ? ? ?This should be a 45 minute visit.  ?

## 2021-05-22 ENCOUNTER — Ambulatory Visit (INDEPENDENT_AMBULATORY_CARE_PROVIDER_SITE_OTHER): Payer: Medicare Other

## 2021-05-22 VITALS — BP 112/62 | HR 64 | Temp 98.2°F | Ht 67.5 in | Wt 153.1 lb

## 2021-05-22 DIAGNOSIS — Z0000 Encounter for general adult medical examination without abnormal findings: Secondary | ICD-10-CM

## 2021-05-22 NOTE — Progress Notes (Signed)
?This visit occurred during the SARS-CoV-2 public health emergency.  Safety protocols were in place, including screening questions prior to the visit, additional usage of staff PPE, and extensive cleaning of exam room while observing appropriate contact time as indicated for disinfecting solutions. ? ?Subjective:  ? Mia Peterson is a 76 y.o. female who presents for Medicare Annual (Subsequent) preventive examination. ? ?Review of Systems    ? ?Cardiac Risk Factors include: advanced age (>16mn, >>86women) ? ?   ?Objective:  ?  ?Today's Vitals  ? 05/22/21 0952  ?BP: 112/62  ?Pulse: 64  ?Temp: 98.2 ?F (36.8 ?C)  ?TempSrc: Oral  ?SpO2: 97%  ?Weight: 153 lb 1.6 oz (69.4 kg)  ?Height: 5' 7.5" (1.715 m)  ? ?Body mass index is 23.62 kg/m?. ? ? ?  05/22/2021  ? 10:06 AM 05/15/2020  ? 11:32 AM 05/27/2017  ?  2:48 PM 02/01/2017  ?  8:23 PM 04/03/2016  ?  1:41 PM 11/03/2015  ?  8:53 AM 10/26/2015  ?  9:35 AM  ?Advanced Directives  ?Does Patient Have a Medical Advance Directive? Yes Yes Yes Yes Yes Yes Yes  ?Type of AParamedicof AOrdLiving will HLa [place]Living will  [place] will  [place] will [place] will  ?Does patient want to make changes to medical advance directive?  No - Patient declined  No - Patient declined  No - Patient declined   ?Copy of [place] Chart? No - copy requested No - copy requested  No - copy requested  No - copy requested No - copy requested  ? ? ?Current Medications (verified) ?Outpatient Encounter Medications as of 05/22/2021  ?Medication Sig  ? aspirin EC 81 MG tablet Take 81 mg by mouth daily.  ? atorvastatin (LIPITOR) 40 MG tablet TAKE 1 TABLET DAILY  ? Calcium Carb-Cholecalciferol (CALCIUM 600 + D PO) Take 2 tablets by mouth daily.  ? cycloSPORINE (RESTASIS) 0.05 % ophthalmic emulsion Place 1 drop into both eyes 2 (two) times daily.  ? Flaxseed,  Linseed, (FLAX SEED OIL) 1000 MG CAPS Take 1,000 mg by mouth daily.  ? Ginger, Zingiber officinalis, (GINGER ROOT) 550 MG CAPS Take 550 mg by mouth daily.  ? Multiple Vitamins-Minerals (ONE DAILY WOMENS 50+ PO) Take 1 tablet by mouth daily.  ? omeprazole (PRILOSEC) 20 MG capsule Take by mouth.  ? oxybutynin (DITROPAN-XL) 5 MG 24 hr tablet Take 5 mg by mouth at bedtime.  ? POTASSIUM PO Take 1 tablet by mouth daily.  ? TURMERIC PO Take 400 mg by mouth 2 (two) times daily.   ? alendronate (FOSAMAX) 70 MG tablet Take 1 tablet by mouth every 7 days. Take with a full glass of water.  ? calcium-vitamin D (OSCAL WITH D) 500-200 MG-UNIT TABS tablet Take by mouth. (Patient not taking: Reported on 05/22/2021)  ? ondansetron (ZOFRAN-ODT) 4 MG disintegrating tablet Take 1 tablet (4 mg total) by mouth every 8 (eight) hours as needed for nausea or vomiting.  ? ?No facility-administered encounter medications on file as of 05/22/2021.  ? ? ?Allergies (verified) ?Other and Adhesive [tape]  ? ?History: ?Past Medical History:  ?Diagnosis Date  ? Arthritis   ? right knee and foot- OSteo   ? Asthma   ? treated in 20's and 30's  ? [facility] ([facility]   ? skin- squamous basal  ? Cataract   ? Complication of anesthesia   ? very slow  to awaken   ? GERD (gastroesophageal reflux disease)   ? prn Prilosec  ? Head injury, closed   ? 10/26/15- years ago - 30ish  ? Shortness of breath dyspnea   ? with exertion  ? Vaso vagal episode   ? passed out 2 times, close to passing out 3 times  ? Vertigo   ? was treated with rehab March and April 2017, does exercises if she begins to have symptoms  ? ?Past Surgical History:  ?Procedure Laterality Date  ? ABDOMINAL HYSTERECTOMY  1993  ? menorrhagia.  ? [place]  ? CATARACT EXTRACTION, BILATERAL    ? COLONOSCOPY W/ POLYPECTOMY    ? COLONOSCOPY WITH PROPOFOL N/A 02/03/2017  ? Procedure: COLONOSCOPY WITH PROPOFOL;  Surgeon: Carol Ada, MD;  [facility];  Service: Endoscopy;   Laterality: N/A;  ? KNEE ARTHROSCOPY W/ MENISCAL REPAIR Right   ? right knee replaceent     ? TOTAL KNEE ARTHROPLASTY Right 11/03/2015  ? Procedure: RIGHT TOTAL KNEE ARTHROPLASTY;  Surgeon: Dorna Leitz, MD;  Location: [place];  Service: Orthopedics;  Laterality: Right;  ? TUBAL LIGATION  1972  ? [place]  ? ?Family History  ?Problem Relation Age of Onset  ? Heart disease Mother 46  ? Cancer Father 46  ?     pancreatic  ? ?Social History  ? ?Socioeconomic History  ? Marital status: Married  ?  Spouse name: Not on file  ? Number of children: Not on file  ? Years of education: Not on file  ? Highest education level: Not on file  ?Occupational History  ? Not on file  ?Tobacco Use  ? Smoking status: Former  ?  Packs/day: 1.00  ?  Years: 20.00  ?  Pack years: 20.00  ?  Types: Cigarettes  ?  Start date: 61  ?  Quit date: 03/24/1990  ?  Years since quitting: 31.1  ? Smokeless tobacco: Never  ?Vaping Use  ? Vaping Use: Never used  ?Substance and Sexual Activity  ? Alcohol use: Yes  ?  Alcohol/week: 3.0 standard drinks  ?  Types: 3 Glasses of wine per week  ?  Comment: 3 times week  ? Drug use: No  ? Sexual activity: Not on file  ?Other Topics Concern  ? Not on file  ?Social History Narrative  ? Not on file  ? ?Social Determinants of Health  ? ?Financial Resource Strain: Low Risk   ? Difficulty of Paying Living Expenses: Not hard at all  ?Food Insecurity: No Food Insecurity  ? Worried About Charity fundraiser in the Last Year: Never true  ? Ran Out of Food in the Last Year: Never true  ?Transportation Needs: No Transportation Needs  ? Lack of Transportation (Medical): No  ? Lack of Transportation (Non-Medical): No  ?Physical Activity: Sufficiently Active  ? Days of Exercise per Week: 6 days  ? Minutes of Exercise per Session: 50 min  ?Stress: No Stress Concern Present  ? Feeling of Stress : Not at all  ?Social Connections: Not on file  ? ? ?Tobacco Counseling ?Counseling given: Not Answered ? ? ?Clinical  Intake: ? ?Pre-visit preparation completed: Yes ? ?Pain : No/denies pain (left shoulder hurts with movement) ? ?  ? ?Nutritional Status: BMI of 19-24  Normal ?Nutritional Risks: None ?Diabetes: No ? ?How often do you need to have someone help you when you read instructions, pamphlets, or other written materials from your  doctor or pharmacy?: 1 - Never ?What is the last grade level you completed in school?: doctarate ? ?Diabetic? no ? ?Interpreter Needed?: No ? ?Information entered by :: NAllen LPN ? ? ?Activities of Daily Living ? ?  05/22/2021  ? 10:09 AM 05/18/2021  ?  9:09 AM  ?In your present state of health, do you have any difficulty performing the following activities:  ?Hearing? 0 0  ?Comment has hearing aids   ?Vision? 0 0  ?Difficulty concentrating or making decisions? 0 0  ?Walking or climbing stairs? 0 0  ?Dressing or bathing? 0 0  ?Doing errands, shopping? 0 0  ?Preparing Food and eating ? N N  ?Using the Toilet? N N  ?In the past six months, have you accidently leaked urine? Y Y  ?Do you have problems with loss of bowel control? N N  ?Managing your Medications? N N  ?Managing your Finances? N N  ?Housekeeping or managing your Housekeeping? N N  ? ? ?Patient Care Team: ?Eulas Post, MD as PCP - General ? ?Indicate any recent Medical Services you may have received from other than [facility] providers in the past year (date may be approximate). ? ?   ?Assessment:  ? This is a routine wellness examination for Mia Peterson. ? ?Hearing/Vision screen ?Vision Screening - Comments:: Regular eye exams, Dr. Ellie Lunch ? ?Dietary issues and exercise activities discussed: ?Current Exercise Habits: Home exercise routine, Type of exercise: yoga;walking;strength training/weights, Time (Minutes): 50, Frequency (Times/Week): 6, Weekly Exercise (Minutes/Week): 300 ? ? Goals Addressed   ? ?  ?  ?  ?  ? This Visit's Progress  ?  Patient Stated     ?  05/22/2021, wants to lose weight, wants to return to exercise classes ?  ? ?   ? ?Depression Screen ? ?  05/22/2021  ? 10:08 AM 03/05/2021  ?  2:50 PM 05/15/2020  ? 11:36 AM 05/27/2017  ?  2:51 PM 04/03/2016  ?  1:45 PM 03/30/2015  ? 10:29 AM 09/29/2014  ?  1:29 PM  ?PHQ 2/9 Scores  ?PHQ - 2 Score 0 0 0 0 0 0 0  ?

## 2021-05-22 NOTE — Patient Instructions (Addendum)
Mia Peterson , ?Thank you for taking time to come for your Medicare Wellness Visit. I appreciate your ongoing commitment to your health goals. Please review the following plan we discussed and let me know if I can assist you in the future.  ? ?Screening recommendations/referrals: ?Colonoscopy: completed 02/03/2017, due 02/03/2022 ?Mammogram: completed 04/03/2021, due 04/04/2022 ?Bone Density: completed 09/02/2019 ?Recommended yearly ophthalmology/optometry visit for glaucoma screening and checkup ?Recommended yearly dental visit for hygiene and checkup ? ?Vaccinations: ?Influenza vaccine: completed 11/05/2020, due next flu season ?Pneumococcal vaccine: completed 03/30/2015 ?Tdap vaccine: completed 07/17/2018, due 07/16/2028 ?Shingles vaccine: discussed   ?Covid-19: 11/05/2020, 12/21/2019, 04/26/2019, 03/29/2019 ? ?Advanced directives: Please bring a copy of your POA (Power of Attorney) and/or Living Will to your next appointment.  ? ? ?Conditions/risks identified: none ? ?Next appointment: Follow up in one year for your annual wellness visit  ? ? ?Preventive Care 5 Years and Older, Female ?Preventive care refers to lifestyle choices and visits with your health care provider that can promote health and wellness. ?What does preventive care include? ?A yearly physical exam. This is also called an annual well check. ?Dental exams once or twice a year. ?Routine eye exams. Ask your health care provider how often you should have your eyes checked. ?Personal lifestyle choices, including: ?Daily care of your teeth and gums. ?Regular physical activity. ?Eating a healthy diet. ?Avoiding tobacco and drug use. ?Limiting alcohol use. ?Practicing safe sex. ?Taking low-dose aspirin every day. ?Taking vitamin and mineral supplements as recommended by your health care provider. ?What happens during an annual well check? ?The services and screenings done by your health care provider during your annual well check will depend on your age, overall  health, lifestyle risk factors, and family history of disease. ?Counseling  ?Your health care provider may ask you questions about your: ?Alcohol use. ?Tobacco use. ?Drug use. ?Emotional well-being. ?Home and relationship well-being. ?Sexual activity. ?Eating habits. ?History of falls. ?Memory and ability to understand (cognition). ?Work and work Statistician. ?Reproductive health. ?Screening  ?You may have the following tests or measurements: ?Height, weight, and BMI. ?Blood pressure. ?Lipid and cholesterol levels. These may be checked every 5 years, or more frequently if you are over 61 years old. ?Skin check. ?Lung cancer screening. You may have this screening every year starting at age 34 if you have a 30-pack-year history of smoking and currently smoke or have quit within the past 15 years. ?Fecal occult blood test (FOBT) of the stool. You may have this test every year starting at age 56. ?Flexible sigmoidoscopy or colonoscopy. You may have a sigmoidoscopy every 5 years or a colonoscopy every 10 years starting at age 67. ?Hepatitis C blood test. ?Hepatitis B blood test. ?Sexually transmitted disease (STD) testing. ?Diabetes screening. This is done by checking your blood sugar (glucose) after you have not eaten for a while (fasting). You may have this done every 1-3 years. ?Bone density scan. This is done to screen for osteoporosis. You may have this done starting at age 12. ?Mammogram. This may be done every 1-2 years. Talk to your health care provider about how often you should have regular mammograms. ?Talk with your health care provider about your test results, treatment options, and if necessary, the need for more tests. ?Vaccines  ?Your health care provider may recommend certain vaccines, such as: ?Influenza vaccine. This is recommended every year. ?Tetanus, diphtheria, and acellular pertussis (Tdap, Td) vaccine. You may need a Td booster every 10 years. ?Zoster vaccine. You may need this  after age  32. ?Pneumococcal 13-valent conjugate (PCV13) vaccine. One dose is recommended after age 50. ?Pneumococcal polysaccharide (PPSV23) vaccine. One dose is recommended after age 38. ?Talk to your health care provider about which screenings and vaccines you need and how often you need them. ?This information is not intended to replace advice given to you by your health care provider. Make sure you discuss any questions you have with your health care provider. ?Document Released: 03/10/2015 Document Revised: 11/01/2015 Document Reviewed: 12/13/2014 ?Elsevier Interactive Patient Education ? 2017 [place]. ? ?Fall Prevention in the Home ?Falls can cause injuries. They can happen to people of all ages. There are many things you can do to make your home safe and to help prevent falls. ?What can I do on the outside of my home? ?Regularly fix the edges of walkways and driveways and fix any cracks. ?Remove anything that might make you trip as you walk through a door, such as a raised step or threshold. ?Trim any bushes or trees on the path to your home. ?Use bright outdoor lighting. ?Clear any walking paths of anything that might make someone trip, such as rocks or tools. ?Regularly check to see if handrails are loose or broken. Make sure that both sides of any steps have handrails. ?Any raised decks and porches should have guardrails on the edges. ?Have any leaves, snow, or ice cleared regularly. ?Use sand or salt on walking paths during winter. ?Clean up any spills in your garage right away. This includes oil or grease spills. ?What can I do in the bathroom? ?Use night lights. ?Install grab bars by the toilet and in the tub and shower. Do not use towel bars as grab bars. ?Use non-skid mats or decals in the tub or shower. ?If you need to sit down in the shower, use a plastic, non-slip stool. ?Keep the floor dry. Clean up any water that spills on the floor as soon as it happens. ?Remove soap buildup in the tub or shower  regularly. ?Attach bath mats securely with double-sided non-slip rug tape. ?Do not have throw rugs and other things on the floor that can make you trip. ?What can I do in the bedroom? ?Use night lights. ?Make sure that you have a light by your bed that is easy to reach. ?Do not use any sheets or blankets that are too big for your bed. They should not hang down onto the floor. ?Have a firm chair that has side arms. You can use this for support while you get dressed. ?Do not have throw rugs and other things on the floor that can make you trip. ?What can I do in the kitchen? ?Clean up any spills right away. ?Avoid walking on wet floors. ?Keep items that you use a lot in easy-to-reach places. ?If you need to reach something above you, use a strong step stool that has a grab bar. ?Keep electrical cords out of the way. ?Do not use floor polish or wax that makes floors slippery. If you must use wax, use non-skid floor wax. ?Do not have throw rugs and other things on the floor that can make you trip. ?What can I do with my stairs? ?Do not leave any items on the stairs. ?Make sure that there are handrails on both sides of the stairs and use them. Fix handrails that are broken or loose. Make sure that handrails are as long as the stairways. ?Check any carpeting to make sure that it is firmly attached to the  stairs. Fix any carpet that is loose or worn. ?Avoid having throw rugs at the top or bottom of the stairs. If you do have throw rugs, attach them to the floor with carpet tape. ?Make sure that you have a light switch at the top of the stairs and the bottom of the stairs. If you do not have them, ask someone to add them for you. ?What else can I do to help prevent falls? ?Wear shoes that: ?Do not have high heels. ?Have rubber bottoms. ?Are comfortable and fit you well. ?Are closed at the toe. Do not wear sandals. ?If you use a stepladder: ?Make sure that it is fully opened. Do not climb a closed stepladder. ?Make sure that  both sides of the stepladder are locked into place. ?Ask someone to hold it for you, if possible. ?Clearly mark and make sure that you can see: ?Any grab bars or handrails. ?First and last steps. ?Where the edge of ea

## 2021-05-25 DIAGNOSIS — M79671 Pain in right foot: Secondary | ICD-10-CM | POA: Diagnosis not present

## 2021-05-30 ENCOUNTER — Ambulatory Visit (INDEPENDENT_AMBULATORY_CARE_PROVIDER_SITE_OTHER): Payer: Medicare Other | Admitting: Family Medicine

## 2021-05-30 ENCOUNTER — Encounter: Payer: Self-pay | Admitting: Family Medicine

## 2021-05-30 VITALS — BP 126/70 | HR 57 | Temp 97.9°F | Ht 67.5 in | Wt 153.3 lb

## 2021-05-30 DIAGNOSIS — E785 Hyperlipidemia, unspecified: Secondary | ICD-10-CM

## 2021-05-30 DIAGNOSIS — G4733 Obstructive sleep apnea (adult) (pediatric): Secondary | ICD-10-CM | POA: Diagnosis not present

## 2021-05-30 DIAGNOSIS — M159 Polyosteoarthritis, unspecified: Secondary | ICD-10-CM

## 2021-05-30 DIAGNOSIS — M150 Primary generalized (osteo)arthritis: Secondary | ICD-10-CM

## 2021-05-30 LAB — HEPATIC FUNCTION PANEL
ALT: 20 U/L (ref 0–35)
AST: 35 U/L (ref 0–37)
Albumin: 4.1 g/dL (ref 3.5–5.2)
Alkaline Phosphatase: 63 U/L (ref 39–117)
Bilirubin, Direct: 0.1 mg/dL (ref 0.0–0.3)
Total Bilirubin: 0.4 mg/dL (ref 0.2–1.2)
Total Protein: 6.7 g/dL (ref 6.0–8.3)

## 2021-05-30 LAB — LIPID PANEL
Cholesterol: 132 mg/dL (ref 0–200)
HDL: 72.1 mg/dL (ref >39.00)
LDL Cholesterol: 52 mg/dL (ref 0–99)
NonHDL: 59.7
Total CHOL/HDL Ratio: 2
Triglycerides: 40 mg/dL (ref 0.0–149.0)
VLDL: 8 mg/dL (ref 0.0–40.0)

## 2021-05-30 NOTE — Progress Notes (Signed)
? ?Established Patient Office Visit ? ?Subjective:  ?[name], female    DOB: 02/04/1946  Age: 76 y.o. MRN: [id number] ? ?CC:  ?Chief Complaint  ?Patient presents with  ? Follow-up  ? Results  ? ? ?HPI ?Mia Peterson presents for the following items ? ?Multiple arthralgias-especially neck, right foot, hips.  She has seen orthopedist several times this year.  Recently had some progressive right foot pain.  X-rays reportedly showed arthritis changes but no fracture or other acute bony abnormality.  She is very frustrated with inability to do is much activity wise.  She is still engaging in tai chi and doing some type of virtual program through [place].  She has done some swimming exercises in the past and hopes to get back in the pool soon. ? ?She has hyperlipidemia and needs follow-up labs.  She is on Lipitor 40 mg daily.  Has history of TIA.  She also remains on baby aspirin 1 daily. ? ?She had sleep study done several months ago.  This showed mild obstructive sleep apnea with 14 apneas per hour with O2 saturations dropping as low as 81%.  There have been some discussion regarding CPAP versus dental appliance but she basically wanted further explanation of her sleep results before looking at those options.  She specifically would like to have follow-up appointment with pulmonary to discuss sleep results in more detail.  Continues to have some fatigue and daytime somnolence. ? ? ? ?Past Medical History:  ?Diagnosis Date  ? Arthritis   ? right knee and foot- OSteo   ? Asthma   ? treated in 20's and 30's  ? Cancer [facility])   ? skin- squamous basal  ? Cataract   ? Complication of anesthesia   ? very slow to awaken   ? GERD (gastroesophageal reflux disease)   ? prn Prilosec  ? Head injury, closed   ? 10/26/15- years ago - 30ish  ? Shortness of breath dyspnea   ? with exertion  ? Vaso vagal episode   ? passed out 2 times, close to passing out 3 times  ? Vertigo   ? was treated with rehab March  and April 2017, does exercises if she begins to have symptoms  ? ? ?Past Surgical History:  ?Procedure Laterality Date  ? ABDOMINAL HYSTERECTOMY  1993  ? menorrhagia.  ? [place]  ? CATARACT EXTRACTION, BILATERAL    ? COLONOSCOPY W/ POLYPECTOMY    ? COLONOSCOPY WITH PROPOFOL N/A 02/03/2017  ? Procedure: COLONOSCOPY WITH PROPOFOL;  Surgeon: Carol Ada, MD;  Location: [facility];  Service: Endoscopy;  Laterality: N/A;  ? KNEE ARTHROSCOPY W/ MENISCAL REPAIR Right   ? right knee replaceent     ? TOTAL KNEE ARTHROPLASTY Right 11/03/2015  ? Procedure: RIGHT TOTAL KNEE ARTHROPLASTY;  Surgeon: Dorna Leitz, MD;  Location: [facility];  Service: Orthopedics;  Laterality: Right;  ? TUBAL LIGATION  1972  ? [place]  ? ? ?Family History  ?Problem Relation Age of Onset  ? Heart disease Mother 12  ? Cancer Father 51  ?     pancreatic  ? ? ?Social History  ? ?Socioeconomic History  ? Marital status: Married  ?  Spouse name: Not on file  ? Number of children: Not on file  ? Years of education: Not on file  ? Highest education level: Not on file  ?Occupational History  ? Not on file  ?Tobacco  Use  ? Smoking status: Former  ?  Packs/day: 1.00  ?  Years: 20.00  ?  Pack years: 20.00  ?  Types: Cigarettes  ?  Start date: 64  ?  Quit date: 03/24/1990  ?  Years since quitting: 31.2  ? Smokeless tobacco: Never  ?Vaping Use  ? Vaping Use: Never used  ?Substance and Sexual Activity  ? Alcohol use: Yes  ?  Alcohol/week: 3.0 standard drinks  ?  Types: 3 Glasses of wine per week  ?  Comment: 3 times week  ? Drug use: No  ? Sexual activity: Not on file  ?Other Topics Concern  ? Not on file  ?Social History Narrative  ? Not on file  ? ?Social Determinants of Health  ? ?Financial Resource Strain: Low Risk   ? Difficulty of Paying Living Expenses: Not hard at all  ?Food Insecurity: No Food Insecurity  ? Worried About Charity fundraiser in the Last Year: Never true  ? Ran Out of Food in the Last Year: Never  true  ?Transportation Needs: No Transportation Needs  ? Lack of Transportation (Medical): No  ? Lack of Transportation (Non-Medical): No  ?Physical Activity: Sufficiently Active  ? Days of Exercise per Week: 6 days  ? Minutes of Exercise per Session: 50 min  ?Stress: No Stress Concern Present  ? Feeling of Stress : Not at all  ?Social Connections: Not on file  ?Intimate Partner Violence: Not on file  ? ? ?Outpatient Medications Prior to Visit  ?Medication Sig Dispense Refill  ? aspirin EC 81 MG tablet Take 81 mg by mouth daily.    ? atorvastatin (LIPITOR) 40 MG tablet TAKE 1 TABLET DAILY 90 tablet 3  ? calcium-vitamin D (OSCAL WITH D) 500-200 MG-UNIT TABS tablet Take by mouth.    ? cycloSPORINE (RESTASIS) 0.05 % ophthalmic emulsion Place 1 drop into both eyes 2 (two) times daily.    ? Flaxseed, Linseed, (FLAX SEED OIL) 1000 MG CAPS Take 1,000 mg by mouth daily.    ? Ginger, Zingiber officinalis, (GINGER ROOT) 550 MG CAPS Take 550 mg by mouth daily.    ? MELOXICAM PO Take by mouth.    ? Multiple Vitamins-Minerals (ONE DAILY WOMENS 50+ PO) Take 1 tablet by mouth daily.    ? omeprazole (PRILOSEC) 20 MG capsule Take by mouth.    ? oxybutynin (DITROPAN-XL) 5 MG 24 hr tablet Take 5 mg by mouth at bedtime.    ? POTASSIUM PO Take 1 tablet by mouth daily.    ? TURMERIC PO Take 400 mg by mouth 2 (two) times daily.     ? predniSONE (STERAPRED UNI-PAK 21 TAB) 5 MG (21) TBPK tablet Take by mouth.    ? alendronate (FOSAMAX) 70 MG tablet Take 1 tablet by mouth every 7 days. Take with a full glass of water. 12 tablet 3  ? Calcium Carb-Cholecalciferol (CALCIUM 600 + D PO) Take 2 tablets by mouth daily.    ? ondansetron (ZOFRAN-ODT) 4 MG disintegrating tablet Take 1 tablet (4 mg total) by mouth every 8 (eight) hours as needed for nausea or vomiting. 15 tablet 0  ? ?No facility-administered medications prior to visit.  ? ? ?Allergies  ?Allergen Reactions  ? Other Rash  ?  Mary Kay cooling pads, severe itching  ? Adhesive [Tape] Hives   ?  Use paper tape only  ? ? ?ROS ?Review of Systems  ?Constitutional:  Positive for fatigue. Negative for appetite change.  ?Respiratory:  Negative for cough and shortness of breath.   ?Cardiovascular:  Negative for chest pain.  ?Gastrointestinal:  Negative for abdominal pain.  ?Genitourinary:  Negative for dysuria.  ?Musculoskeletal:  Positive for arthralgias.  ? ?  ?Objective:  ?  ?Physical Exam ?Vitals reviewed.  ?Constitutional:   ?   Appearance: Normal appearance.  ?Cardiovascular:  ?   Rate and Rhythm: Normal rate and regular rhythm.  ?Pulmonary:  ?   Effort: Pulmonary effort is normal.  ?   Breath sounds: Normal breath sounds.  ?Musculoskeletal:  ?   Right lower leg: No edema.  ?   Left lower leg: No edema.  ?Neurological:  ?   Mental Status: She is alert.  ? ? ?BP 126/70 (BP Location: Left Arm, Patient Position: Sitting, Cuff Size: Normal)   Pulse (!) 57   Temp 97.9 ?F (36.6 ?C) (Oral)   Ht 5' 7.5" (1.715 m)   Wt 153 lb 4.8 oz (69.5 kg)   SpO2 97%   BMI 23.66 kg/m?  ?Wt Readings from Last 3 Encounters:  ?05/30/21 153 lb 4.8 oz (69.5 kg)  ?05/22/21 153 lb 1.6 oz (69.4 kg)  ?03/05/21 150 lb 8 oz (68.3 kg)  ? ? ? ?Health Maintenance Due  ?Topic Date Due  ? Zoster Vaccines- Shingrix (1 of 2) Never done  ? ? ?There are no preventive care reminders to display for this patient. ? ?Lab Results  ?Component Value Date  ? TSH 1.59 10/30/2018  ? ?Lab Results  ?Component Value Date  ? WBC 4.4 10/30/2018  ? HGB 13.5 10/30/2018  ? HCT 39.7 10/30/2018  ? MCV 88.3 10/30/2018  ? PLT 278.0 10/30/2018  ? ?Lab Results  ?Component Value Date  ? NA 137 10/30/2018  ? K 4.4 10/30/2018  ? CO2 29 10/30/2018  ? GLUCOSE 85 10/30/2018  ? BUN 13 10/30/2018  ? CREATININE 0.69 10/30/2018  ? BILITOT 0.5 03/21/2020  ? ALKPHOS 53 03/21/2020  ? AST 30 03/21/2020  ? ALT 20 03/21/2020  ? PROT 6.5 03/21/2020  ? ALBUMIN 3.9 03/21/2020  ? CALCIUM 9.1 10/30/2018  ? ANIONGAP 7 02/01/2017  ? GFR 83.27 10/30/2018  ? ?Lab Results  ?Component Value  Date  ? CHOL 133 03/21/2020  ? ?Lab Results  ?Component Value Date  ? HDL 69.50 03/21/2020  ? ?Lab Results  ?Component Value Date  ? [place] 55 03/21/2020  ? ?Lab Results  ?Component Value Date  ? TRIG 39.

## 2021-06-09 NOTE — Progress Notes (Signed)
01/26/21- 61 yoF former smoker(20 pk yrs) for sleep evaluation self referred ?Husband Mia Peterson our pt, uses [facility] for CPAP. ?Retired Professor- educated counselors ?Medical problem list includes TIA, Asthma, GERD, Ischemic Colitis, Diverticulosis, Rosacea,  ?Epworth score-12 ?Body weight today-152 lbs ?Covid vax-4 Phizer ?Flu vax-had ?She describes chronic daytime sleepiness.  Worse in the last year or 2.  As a child she was told she needed to go to bed with younger children because "she needed more sleep than other people".  She denies cataplexy or sleep paralysis.  No sleep medications.  1 or 2 cups of morning coffee.  No alcohol.  Thyroid testing normal. ?She has been told a few times of snoring.  Falls asleep easily with bedtime between 830 and 9:30 PM but wakes frequently during the night, sometimes staying awake before finally up between 5 and 6 AM.  Not told of witnessed apneas or complex parasomnias.  No similar family history. ? ?06/12/21- 48 yoF former smoker (20 pk yrs) followed for OSA, EDS, complicated by TIA, Asthma, GERD, Ischemic Colitis, Diverticulosis, Rosacea,  ?HST 03/27/21- AHI 14.1/ hr, desaturation to 81%, body weight 152 lbs ?Husband  Mia Peterson our pt, uses [facility] for CPAP. ?Body weight today-153 lbs ?Covid vax-4 Phizer ?Flu vax- had ?----Patient was seen by Dr. Corky Sing office and stated that she didn't want to proceed because she has several questions and wants more information.  ?We had referred to Dr Toy Cookey, DDS for OAP. ?Comes back to discuss her sleep study. ?Her primary sleep symptom is daytime fatigue.  She sees her husband's experience with CPAP and hopes for alternatives for herself.  Long discussion.  I discussed the significance of mild OSA.  She wants to wait on any treatment at this time and to come back in a year for reconsideration.  We did discuss conservative measures including keeping weight down, sleeping off flat of back. ? ?ROS-see HPI   + =  positive ?Constitutional:    weight loss, night sweats, fevers, chills, +fatigue, lassitude. ?HEENT:    headaches, difficulty swallowing, tooth/dental problems, sore throat,  ?     +sneezing, +itching, ear ache, nasal congestion, post nasal drip, snoring ?CV:    chest pain, orthopnea, PND, swelling in lower extremities, anasarca,                                 ?  dizziness, palpitations ?Resp:   shortness of breath with exertion or at rest.   ?             productive cough,   non-productive cough, coughing up of blood.   ?           change in color of mucus.  wheezing.   ?Skin:    +rosacea ?GI:  +heartburn, indigestion, +abdominal pain, nausea, vomiting, diarrhea,  ?               change in bowel habits, loss of appetite ?GU: dysuria, change in color of urine, no urgency or frequency.   flank pain. ?MS:   +joint pain, stiffness, decreased range of motion, back pain. ?Neuro-     nothing unusual ?Psych:  change in mood or affect.  depression or anxiety.   memory loss. ? ?OBJ- Physical Exam ?General- Alert, Oriented, Affect-appropriate, Distress- none acute, not obese ?Skin- rash-none, lesions- none, excoriation- none ?Lymphadenopathy- none ?Head- atraumatic ?  Eyes- Gross vision intact, PERRLA, conjunctivae and secretions clear ?           Ears- Hearing, canals-normal ?           Nose- Clear, no-Septal dev, mucus, polyps, erosion, perforation  ?           Throat- Mallampati III, mucosa clear , drainage- none, tonsils- atrophic, + teeth ?Neck- flexible , trachea midline, no stridor , thyroid nl, carotid no bruit ?Chest - symmetrical excursion , unlabored ?          Heart/CV- RRR , no murmur , no gallop  , no rub, nl s1 s2 ?                          - JVD- none , edema- none, stasis changes- none, varices- none ?          Lung- clear to P&A, wheeze- none, cough- none , dullness-none, rub- none ?          Chest wall-  ?Abd-  ?Br/ Gen/ Rectal- Not done, not indicated ?Extrem- cyanosis- none, clubbing, none,  atrophy- none, strength- nl ?Neuro- grossly intact to observation ? ?

## 2021-06-12 ENCOUNTER — Ambulatory Visit: Payer: Medicare Other | Admitting: Family Medicine

## 2021-06-12 ENCOUNTER — Ambulatory Visit (INDEPENDENT_AMBULATORY_CARE_PROVIDER_SITE_OTHER): Payer: Medicare Other | Admitting: Internal Medicine

## 2021-06-12 ENCOUNTER — Encounter: Payer: Self-pay | Admitting: Internal Medicine

## 2021-06-12 VITALS — BP 108/74 | HR 69 | Temp 98.5°F | Ht 67.5 in | Wt 154.0 lb

## 2021-06-12 DIAGNOSIS — G4733 Obstructive sleep apnea (adult) (pediatric): Secondary | ICD-10-CM | POA: Diagnosis not present

## 2021-06-12 DIAGNOSIS — K219 Gastro-esophageal reflux disease without esophagitis: Secondary | ICD-10-CM

## 2021-06-12 DIAGNOSIS — L293 Anogenital pruritus, unspecified: Secondary | ICD-10-CM | POA: Diagnosis not present

## 2021-06-12 DIAGNOSIS — R300 Dysuria: Secondary | ICD-10-CM

## 2021-06-12 DIAGNOSIS — N898 Other specified noninflammatory disorders of vagina: Secondary | ICD-10-CM

## 2021-06-12 LAB — POCT URINALYSIS DIPSTICK
Bilirubin, UA: NEGATIVE
Blood, UA: POSITIVE
Glucose, UA: NEGATIVE
Ketones, UA: NEGATIVE
Nitrite, UA: POSITIVE
Protein, UA: NEGATIVE
Spec Grav, UA: 1.01 (ref 1.010–1.025)
Urobilinogen, UA: 0.2 E.U./dL
pH, UA: 6 (ref 5.0–8.0)

## 2021-06-12 MED ORDER — NITROFURANTOIN MONOHYD MACRO 100 MG PO CAPS: 100.0000 mg | ORAL_CAPSULE | Freq: Two times a day (BID) | ORAL | 0 refills | Status: DC

## 2021-06-12 NOTE — Patient Instructions (Signed)
As discussed, you have mild obstructive sleep apnea. ? ?We discussed advice to sleep off flat of your back and avoid weight gain. ? ?We can sit down again in a year and reconsider options then if you would like. ?

## 2021-06-12 NOTE — Patient Instructions (Addendum)
Checking culture for probable uti   ?Probably have some mild yeast also . ? ?Take antibiotic and will be notified when results back.  ? ?[place] OTC  monistat  ; miconazole for vaginal   3 or 7 days or equivalent for the possible yeast infection.  ? ?Fu if    persistent or progressive . ? ?

## 2021-06-12 NOTE — Progress Notes (Signed)
? ?Chief Complaint  ?Patient presents with  ? Urinary Tract Infection  ?  And possible yeast infection burning, itching all night slightly better this morning  ? ? ?HPI: ?Mia Peterson 76 y.o. come in for new acute problem. ?Began first thing yesterday am with urinary urgency and frequency and dysuria increasing   and then icthing in vulva .  Began.  She had some leftover OTC external miconazole she used last night externally which helped the itching. ?Still has some burning or burning with urination no vaginal discharge. ?No recent antibiotic but did have prednsione  last week for foot arthritis . ?Note history of UTI none recently ? ?ROS: See pertinent positives and negatives per HPI. ? ?Past Medical History:  ?Diagnosis Date  ? Arthritis   ? right knee and foot- OSteo   ? Asthma   ? treated in 20's and 30's  ? Cancer [facility])   ? skin- squamous basal  ? Cataract   ? Complication of anesthesia   ? very slow to awaken   ? GERD (gastroesophageal reflux disease)   ? prn Prilosec  ? Head injury, closed   ? 10/26/15- years ago - 30ish  ? Shortness of breath dyspnea   ? with exertion  ? Vaso vagal episode   ? passed out 2 times, close to passing out 3 times  ? Vertigo   ? was treated with rehab March and April 2017, does exercises if she begins to have symptoms  ? ? ?Family History  ?Problem Relation Age of Onset  ? Heart disease Mother 13  ? Cancer Father 14  ?     pancreatic  ? ? ?Social History  ? ?Socioeconomic History  ? Marital status: Married  ?  Spouse name: Not on file  ? Number of children: Not on file  ? Years of education: Not on file  ? Highest education level: Doctorate  ?Occupational History  ? Not on file  ?Tobacco Use  ? Smoking status: Former  ?  Packs/day: 1.00  ?  Years: 20.00  ?  Pack years: 20.00  ?  Types: Cigarettes  ?  Start date: 78  ?  Quit date: 03/24/1990  ?  Years since quitting: 31.2  ? Smokeless tobacco: Never  ?Vaping Use  ? Vaping Use: Never used  ?Substance and Sexual Activity  ?  Alcohol use: Yes  ?  Alcohol/week: 3.0 standard drinks  ?  Types: 3 Glasses of wine per week  ?  Comment: 3 times week  ? Drug use: No  ? Sexual activity: Not on file  ?Other Topics Concern  ? Not on file  ?Social History Narrative  ? Not on file  ? ?Social Determinants of Health  ? ?Financial Resource Strain: Low Risk   ? Difficulty of Paying Living Expenses: Not hard at all  ?Food Insecurity: No Food Insecurity  ? Worried About Charity fundraiser in the Last Year: Never true  ? Ran Out of Food in the Last Year: Never true  ?Transportation Needs: No Transportation Needs  ? Lack of Transportation (Medical): No  ? Lack of Transportation (Non-Medical): No  ?Physical Activity: Sufficiently Active  ? Days of Exercise per Week: 6 days  ? Minutes of Exercise per Session: 50 min  ?Stress: No Stress Concern Present  ? Feeling of Stress : Not at all  ?Social Connections: Socially Integrated  ? Frequency of Communication with Friends and Family: Three times a week  ? Frequency of Social  Gatherings with Friends and Family: Three times a week  ? Attends Religious Services: More than 4 times per year  ? Active Member of Clubs or Organizations: Yes  ? Attends Archivist Meetings: More than 4 times per year  ? Marital Status: Married  ? ? ?Outpatient Medications Prior to Visit  ?Medication Sig Dispense Refill  ? aspirin EC 81 MG tablet Take 81 mg by mouth daily.    ? atorvastatin (LIPITOR) 40 MG tablet TAKE 1 TABLET DAILY 90 tablet 3  ? calcium-vitamin D (OSCAL WITH D) 500-200 MG-UNIT TABS tablet Take by mouth.    ? cycloSPORINE (RESTASIS) 0.05 % ophthalmic emulsion Place 1 drop into both eyes 2 (two) times daily.    ? Flaxseed, Linseed, (FLAX SEED OIL) 1000 MG CAPS Take 1,000 mg by mouth daily.    ? Ginger, Zingiber officinalis, (GINGER ROOT) 550 MG CAPS Take 550 mg by mouth daily.    ? MELOXICAM PO Take by mouth.    ? Multiple Vitamins-Minerals (ONE DAILY WOMENS 50+ PO) Take 1 tablet by mouth daily.    ? omeprazole  (PRILOSEC) 20 MG capsule Take by mouth.    ? oxybutynin (DITROPAN-XL) 5 MG 24 hr tablet Take 5 mg by mouth at bedtime.    ? POTASSIUM PO Take 1 tablet by mouth daily.    ? TURMERIC PO Take 400 mg by mouth 2 (two) times daily.     ? predniSONE (STERAPRED UNI-PAK 21 TAB) 5 MG (21) TBPK tablet Take by mouth.    ? ?No facility-administered medications prior to visit.  ? ? ? ?EXAM: ? ?BP 108/74 (BP Location: Left Arm, Patient Position: Sitting, Cuff Size: Normal)   Pulse 69   Temp 98.5 ?F (36.9 ?C) (Oral)   Ht 5' 7.5" (1.715 m)   Wt 154 lb (69.9 kg)   SpO2 97%   BMI 23.76 kg/m?  ? ?Body mass index is 23.76 kg/m?. ? ?GENERAL: vitals reviewed and listed above, alert, oriented, appears well hydrated and in no acute distress ?HEENT: atraumatic, conjunctiva  clear, no obvious abnormalities on inspection of external nose and ears  ?CV: HRRR, no clubbing cyanosis or  peripheral edema nl cap refill  ?External GU ?Very mild vulvar erythema no copious discharge urethra clear no obvious external lesions at the introitus. ?MS: moves all extremities without noticeable focal  abnormality ?PSYCH: pleasant and cooperative, no obvious depression or anxiety ? ?BP Readings from Last 3 Encounters:  ?06/12/21 108/74  ?05/30/21 126/70  ?05/22/21 112/62  ? ?Urinalysis ?   ?Component Value Date/Time  ? COLORURINE STRAW (A) 02/01/2017 1550  ? APPEARANCEUR CLEAR 02/01/2017 1550  ? LABSPEC 1.002 (L) 02/01/2017 1550  ? PHURINE 6.0 02/01/2017 1550  ? GLUCOSEU NEGATIVE 02/01/2017 1550  ? GLUCOSEU NEGATIVE 01/21/2017 1601  ? HGBUR SMALL (A) 02/01/2017 1550  ? HGBUR negative 10/23/2009 1102  ? BILIRUBINUR neg 06/12/2021 1114  ? [place] NEGATIVE 02/01/2017 1550  ? PROTEINUR Negative 06/12/2021 1114  ? PROTEINUR NEGATIVE 02/01/2017 1550  ? UROBILINOGEN 0.2 06/12/2021 1114  ? UROBILINOGEN 0.2 01/21/2017 1601  ? NITRITE positive 06/12/2021 1114  ? NITRITE NEGATIVE 02/01/2017 1550  ? LEUKOCYTESUR Large (3+) (A) 06/12/2021 1114  ? ? ? ?ASSESSMENT AND  PLAN: ? ?Discussed the following assessment and plan: ? ?Dysuria - With frequency new onset - Plan: Culture, Urine, Culture, Urine, CANCELED: Urine Culture, CANCELED: Urine Culture, CANCELED: Urine Culture ? ?Vaginal itching - Plan: POC Urinalysis Dipstick, Culture, Urine, Culture, Urine, CANCELED: Urine Culture, CANCELED: Urine  Culture, CANCELED: Urine Culture ? ?Perineal itching, female ?Urinalysis consistent with UTI with urinary frequency however external itching more consistent with yeast or similar.  External exam not impressive for either. ?Empiric treatment for UTI with Macrobid and get over-the-counter miconazole 3 or 7-day vaginal. ?We will notify when urine culture was results are back adjust medication as indicated at that time. ?-Patient advised to return or notify health care team  if  new concerns arise. ? ?Patient Instructions  ?Checking culture for probable uti   ?Probably have some mild yeast also . ? ?Take antibiotic and will be notified when results back.  ? ?[place] OTC  monistat  ; miconazole for vaginal   3 or 7 days or equivalent for the possible yeast infection.  ? ?Fu if    persistent or progressive . ? ? ? ?Standley Brooking. Annisa Mazzarella M.D. ?

## 2021-06-14 LAB — URINE CULTURE
MICRO NUMBER:: 13278705
SPECIMEN QUALITY:: ADEQUATE

## 2021-06-15 NOTE — Progress Notes (Signed)
urine culture shows bacteria sensitive to medication given . Should resolve with current treatment .FU if not better. This confirms    UTI as cause of most symptoms  ?

## 2021-06-16 ENCOUNTER — Encounter: Payer: Self-pay | Admitting: Internal Medicine

## 2021-06-18 MED ORDER — FLUCONAZOLE 150 MG PO TABS: 150.0000 mg | ORAL_TABLET | Freq: Once | ORAL | 0 refills | Status: AC

## 2021-06-18 NOTE — Telephone Encounter (Signed)
Will send in  diflucan to your pharmacy ?

## 2021-06-18 NOTE — Telephone Encounter (Signed)
Patient informed of message  

## 2021-06-21 ENCOUNTER — Other Ambulatory Visit: Payer: Self-pay

## 2021-06-21 ENCOUNTER — Encounter: Payer: Self-pay | Admitting: Internal Medicine

## 2021-06-21 MED ORDER — CEPHALEXIN 500 MG PO CAPS
500.0000 mg | ORAL_CAPSULE | Freq: Three times a day (TID) | ORAL | 0 refills | Status: AC
Start: 2021-06-21 — End: 2021-06-26

## 2021-06-21 NOTE — Telephone Encounter (Signed)
We can treat  with a different antibiotic and after that need to FU with your PCP if any symptoms left .  Lavon please send  in cephalexin  500 mg 1 po tid for 5 days ,disp 15  to her pharmacy . And give her this information

## 2021-07-03 ENCOUNTER — Encounter: Payer: Self-pay | Admitting: Internal Medicine

## 2021-07-03 DIAGNOSIS — G4733 Obstructive sleep apnea (adult) (pediatric): Secondary | ICD-10-CM | POA: Insufficient documentation

## 2021-07-03 NOTE — Assessment & Plan Note (Signed)
Reminder of reflux precautions ?

## 2021-07-03 NOTE — Assessment & Plan Note (Signed)
I tried to give her a balanced overview of symptoms and medical issues associated with mild to moderate OSA and treatment options.  Her husband uses CPAP but she is not eager to start it herself.  Alternatives reviewed and questions answered. ?Plan-she chooses to return in a year for reconsideration ?

## 2021-08-08 DIAGNOSIS — M25871 Other specified joint disorders, right ankle and foot: Secondary | ICD-10-CM | POA: Diagnosis not present

## 2021-08-08 DIAGNOSIS — M19071 Primary osteoarthritis, right ankle and foot: Secondary | ICD-10-CM | POA: Diagnosis not present

## 2021-08-21 ENCOUNTER — Telehealth: Payer: Self-pay | Admitting: Family Medicine

## 2021-08-21 DIAGNOSIS — R2689 Other abnormalities of gait and mobility: Secondary | ICD-10-CM

## 2021-08-21 DIAGNOSIS — M19072 Primary osteoarthritis, left ankle and foot: Secondary | ICD-10-CM | POA: Diagnosis not present

## 2021-08-21 DIAGNOSIS — M19071 Primary osteoarthritis, right ankle and foot: Secondary | ICD-10-CM | POA: Diagnosis not present

## 2021-08-22 NOTE — Telephone Encounter (Signed)
Referral placed and pt aware   

## 2021-09-03 DIAGNOSIS — L820 Inflamed seborrheic keratosis: Secondary | ICD-10-CM | POA: Diagnosis not present

## 2021-09-03 DIAGNOSIS — Z85828 Personal history of other malignant neoplasm of skin: Secondary | ICD-10-CM | POA: Diagnosis not present

## 2021-09-03 DIAGNOSIS — D692 Other nonthrombocytopenic purpura: Secondary | ICD-10-CM | POA: Diagnosis not present

## 2021-09-03 DIAGNOSIS — L821 Other seborrheic keratosis: Secondary | ICD-10-CM | POA: Diagnosis not present

## 2021-09-03 DIAGNOSIS — L300 Nummular dermatitis: Secondary | ICD-10-CM | POA: Diagnosis not present

## 2021-09-03 DIAGNOSIS — L700 Acne vulgaris: Secondary | ICD-10-CM | POA: Diagnosis not present

## 2021-10-10 ENCOUNTER — Ambulatory Visit (INDEPENDENT_AMBULATORY_CARE_PROVIDER_SITE_OTHER): Payer: Medicare Other | Admitting: Neurology

## 2021-10-10 ENCOUNTER — Encounter: Payer: Self-pay | Admitting: Neurology

## 2021-10-10 VITALS — BP 120/77 | HR 61 | Ht 67.0 in | Wt 152.0 lb

## 2021-10-10 DIAGNOSIS — R270 Ataxia, unspecified: Secondary | ICD-10-CM

## 2021-10-10 DIAGNOSIS — R200 Anesthesia of skin: Secondary | ICD-10-CM | POA: Diagnosis not present

## 2021-10-10 DIAGNOSIS — M4802 Spinal stenosis, cervical region: Secondary | ICD-10-CM

## 2021-10-10 DIAGNOSIS — R29898 Other symptoms and signs involving the musculoskeletal system: Secondary | ICD-10-CM | POA: Diagnosis not present

## 2021-10-10 DIAGNOSIS — R2689 Other abnormalities of gait and mobility: Secondary | ICD-10-CM | POA: Diagnosis not present

## 2021-10-10 DIAGNOSIS — R202 Paresthesia of skin: Secondary | ICD-10-CM | POA: Diagnosis not present

## 2021-10-10 NOTE — Patient Instructions (Addendum)
MRI brain w/wo contrast and MRI cervical spine, on follow up if no reason found in the brain/cervical spine we can order testing for a variety of cause of toe numbness  Fall Prevention in the Home, Adult Falls can cause injuries and affect people of all ages. There are many simple things that you can do to make your home safe and to help prevent falls. Ask for help when making these changes, if needed. What actions can I take to prevent falls? General instructions Use good lighting in all rooms. Replace any light bulbs that burn out, turn on lights if it is dark, and use night-lights. Place frequently used items in easy-to-reach places. Lower the shelves around your home if necessary. Set up furniture so that there are clear paths around it. Avoid moving your furniture around. Remove throw rugs and other tripping hazards from the floor. Avoid walking on wet floors. Fix any uneven floor surfaces. Add color or contrast paint or tape to grab bars and handrails in your home. Place contrasting color strips on the first and last steps of staircases. When you use a stepladder, make sure that it is completely opened and that the sides and supports are firmly locked. Have someone hold the ladder while you are using it. Do not climb a closed stepladder. Know where your pets are when moving through your home. What can I do in the bathroom?     Keep the floor dry. Immediately clean up any water that is on the floor. Remove soap buildup in the tub or shower regularly. Use nonskid mats or decals on the floor of the tub or shower. Attach bath mats securely with double-sided, nonslip rug tape. If you need to sit down while you are in the shower, use a plastic, nonslip stool. Install grab bars by the toilet and in the tub and shower. Do not use towel bars as grab bars. What can I do in the bedroom? Make sure that a bedside light is easy to reach. Do not use oversized bedding that reaches the floor. Have a  firm chair that has side arms to use for getting dressed. What can I do in the kitchen? Clean up any spills right away. If you need to reach for something above you, use a sturdy step stool that has a grab bar. Keep electrical cables out of the way. Do not use floor polish or wax that makes floors slippery. If you must use wax, make sure that it is non-skid floor wax. What can I do with my stairs? Do not leave any items on the stairs. Make sure that you have a light switch at the top and the bottom of the stairs. Have them installed if you do not have them. Make sure that there are handrails on both sides of the stairs. Fix handrails that are broken or loose. Make sure that handrails are as long as the staircases. Install non-slip stair treads on all stairs in your home. Avoid having throw rugs at the top or bottom of stairs, or secure the rugs with carpet tape to prevent them from moving. Choose a carpet design that does not hide the edge of steps on the stairs. Check any carpeting to make sure that it is firmly attached to the stairs. Fix any carpet that is loose or worn. What can I do on the outside of my home? Use bright outdoor lighting. Regularly repair the edges of walkways and driveways and fix any cracks. Remove high doorway thresholds.  Trim any shrubbery on the main path into your home. Regularly check that handrails are securely fastened and in good repair. Both sides of all steps should have handrails. Install guardrails along the edges of any raised decks or porches. Clear walkways of debris and clutter, including tools and rocks. Have leaves, snow, and ice cleared regularly. Use sand or salt on walkways during winter months. In the garage, clean up any spills right away, including grease or oil spills. What other actions can I take? Wear closed-toe shoes that fit well and support your feet. Wear shoes that have rubber soles or low heels. Use mobility aids as needed, such as  canes, walkers, scooters, and crutches. Review your medicines with your health care provider. Some medicines can cause dizziness or changes in blood pressure, which increase your risk of falling. Talk with your health care provider about other ways that you can decrease your risk of falls. This may include working with a physical therapist or trainer to improve your strength, balance, and endurance. Where to find more information Centers for Disease Control and Prevention, [employer]: http://www.wolf.info/ National Institute on Aging: http://kim-miller.com/ Contact a health care provider if: You are afraid of falling at home. You feel weak, drowsy, or dizzy at home. You fall at home. Summary There are many simple things that you can do to make your home safe and to help prevent falls. Ways to make your home safe include removing tripping hazards and installing grab bars in the bathroom. Ask for help when making these changes in your home. This information is not intended to replace advice given to you by your health care provider. Make sure you discuss any questions you have with your health care provider. Document Revised: 11/13/2020 Document Reviewed: 09/15/2019 Elsevier Patient Education  [place].
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
## 2021-10-15 ENCOUNTER — Telehealth: Payer: Self-pay | Admitting: Neurology

## 2021-10-15 NOTE — Telephone Encounter (Signed)
Spoke with pt about rescheduling 11/16 appointment due to Dr. Jaynee Eagles being out. Pt stated she will call back to reschedule

## 2021-10-25 ENCOUNTER — Telehealth: Payer: Self-pay | Admitting: Neurology

## 2021-10-25 NOTE — Telephone Encounter (Signed)
medicare/BCBS NPR sent to GI

## 2021-11-08 DIAGNOSIS — I8312 Varicose veins of left lower extremity with inflammation: Secondary | ICD-10-CM | POA: Diagnosis not present

## 2021-11-08 DIAGNOSIS — I83812 Varicose veins of left lower extremities with pain: Secondary | ICD-10-CM | POA: Diagnosis not present

## 2021-11-10 ENCOUNTER — Ambulatory Visit
Admission: RE | Admit: 2021-11-10 | Discharge: 2021-11-10 | Disposition: A | Discharge status: Home / Self Care | Payer: Medicare Other | Source: Ambulatory Visit | Attending: Neurology | Admitting: Neurology

## 2021-11-10 DIAGNOSIS — M4802 Spinal stenosis, cervical region: Secondary | ICD-10-CM

## 2021-11-10 DIAGNOSIS — R29898 Other symptoms and signs involving the musculoskeletal system: Secondary | ICD-10-CM

## 2021-11-10 DIAGNOSIS — R200 Anesthesia of skin: Secondary | ICD-10-CM

## 2021-11-10 DIAGNOSIS — R270 Ataxia, unspecified: Secondary | ICD-10-CM | POA: Diagnosis not present

## 2021-11-10 DIAGNOSIS — R2689 Other abnormalities of gait and mobility: Secondary | ICD-10-CM

## 2021-11-10 MED ORDER — GADOBENATE DIMEGLUMINE 529 MG/ML IV SOLN
15.0000 mL | Freq: Once | INTRAVENOUS | Status: AC | PRN
  Administered 2021-11-10: 15 mL via INTRAVENOUS

## 2021-11-19 DIAGNOSIS — Z23 Encounter for immunization: Secondary | ICD-10-CM | POA: Diagnosis not present

## 2021-11-26 DIAGNOSIS — Z23 Encounter for immunization: Secondary | ICD-10-CM | POA: Diagnosis not present

## 2021-11-28 DIAGNOSIS — I8312 Varicose veins of left lower extremity with inflammation: Secondary | ICD-10-CM | POA: Diagnosis not present

## 2021-11-28 DIAGNOSIS — I8311 Varicose veins of right lower extremity with inflammation: Secondary | ICD-10-CM | POA: Diagnosis not present

## 2022-01-01 ENCOUNTER — Telehealth: Payer: Self-pay | Admitting: *Deleted

## 2022-01-01 NOTE — Patient Outreach (Signed)
  Care Coordination   01/01/2022 Name: Mia Peterson MRN: [id number] DOB: Dec 22, 1945   Care Coordination Outreach Attempts:  An unsuccessful telephone outreach was attempted today to offer the patient information about available care coordination services as a benefit of their health plan.   Follow Up Plan:  Additional outreach attempts will be made to offer the patient care coordination information and services.   Encounter Outcome:  No Answer  Care Coordination Interventions Activated:  No   Care Coordination Interventions:  No, not indicated    Raina Mina, RN Care Management Coordinator [place] Office [phone]

## 2022-01-10 ENCOUNTER — Ambulatory Visit: Payer: Medicare Other | Admitting: Neurology

## 2022-01-15 ENCOUNTER — Ambulatory Visit (INDEPENDENT_AMBULATORY_CARE_PROVIDER_SITE_OTHER): Payer: Medicare Other | Admitting: Neurology

## 2022-01-15 ENCOUNTER — Encounter: Payer: Self-pay | Admitting: Neurology

## 2022-01-15 VITALS — BP 145/81 | HR 57 | Ht 68.0 in | Wt 154.2 lb

## 2022-01-15 DIAGNOSIS — R270 Ataxia, unspecified: Secondary | ICD-10-CM | POA: Diagnosis not present

## 2022-01-15 DIAGNOSIS — R9082 White matter disease, unspecified: Secondary | ICD-10-CM

## 2022-01-15 DIAGNOSIS — E538 Deficiency of other specified B group vitamins: Secondary | ICD-10-CM

## 2022-01-15 DIAGNOSIS — G379 Demyelinating disease of central nervous system, unspecified: Secondary | ICD-10-CM | POA: Diagnosis not present

## 2022-01-15 DIAGNOSIS — F419 Anxiety disorder, unspecified: Secondary | ICD-10-CM

## 2022-01-15 MED ORDER — ALPRAZOLAM 0.25 MG PO TABS: ORAL_TABLET | ORAL | 1 refills | Status: DC

## 2022-01-15 NOTE — Patient Instructions (Signed)
Call for imaging  Multiple Sclerosis Multiple sclerosis (MS) is a disease of the brain, spinal cord, and optic nerves (central nervous system). It causes the body's disease-fighting system (immunesystem) to destroy the protective covering around nerves in the brain (myelin sheath). When this happens, signals (nerve impulses) going to and from the brain and spinal cord do not get sent properly or may not get sent at all. There are several types of MS: Relapsing-remitting MS. This is the most common type. This causes sudden attacks of symptoms. After an attack, you may recover completely until the next attack, or some symptoms may remain permanently. Secondary progressive MS. This usually develops after the onset of relapsing-remitting MS. Similar to relapsing-remitting MS, this type also causes sudden attacks of symptoms. Attacks may be less frequent, but symptoms slowly get worse over time. Primary progressive MS. This causes symptoms that steadily progress over time. This type of MS does not cause sudden attacks of symptoms. The age of onset of MS varies, but it often develops between 16 and 33 years of age. MS is a lifelong (chronic) condition. There is no cure, but treatment can help slow down the progression of the disease. What are the causes? The cause of this condition is not known. What increases the risk? You are more likely to develop this condition if: You are a woman. You have a relative with MS. However, the condition is not passed from parent to child (inherited). You have a lack (deficiency) of vitamin D. You smoke. MS is more common in the [place] than in the [place]. What are the signs or symptoms? Relapsing-remitting and secondary progressive MS cause symptoms to occur in episodes or attacks that may last weeks to months. There may be long periods between attacks in which there are almost no symptoms. Primary progressive MS causes symptoms to  steadily progress after they develop. Symptoms of MS vary because of the many different ways it affects the central nervous system. The main symptoms include: Vision problems and eye pain. Numbness and weakness. Inability to move your arms, hands, feet, or legs (paralysis). Balance problems. Shaking that you cannot control (tremors). Sudden muscle tightening (spasms). Problems with thinking (cognitive changes). MS can also cause symptoms that are associated with the disease but are not always the direct result of an MS attack. They may include: Inability to control when you urinate or have bowel movements (incontinence). Headaches. Fatigue. Inability to tolerate heat. Emotional changes. Depression. Pain. How is this diagnosed? This condition is diagnosed based on: Your symptoms. A neurological exam. This involves checking your central nervous system function, such as nerve function, reflexes, and coordination. MRIs of the brain and spinal cord. Lab tests, including a lumbar puncture that tests the fluid that surrounds the brain and spinal cord (cerebrospinal fluid). Tests to measure the electrical activity of the brain in response to stimulation (evoked potentials). How is this treated? There is no cure for MS, but medicines can help decrease the number and frequency of attacks and help relieve nuisance symptoms. Treatment options may include: Medicines that: Reduce the frequency of attacks. These medicines may be given by injection, by mouth (orally), or through an IV. Reduce inflammation (steroids). These may provide short-term relief of symptoms. Help control pain, depression, fatigue, or incontinence. Nutritional counseling. Eating a healthy, balanced diet can help with symptoms. Taking vitamin D supplements, if you have a deficiency. Using devices to help you move around (assistive devices), such as braces, a cane, or  a walker. Therapy, such as: Physical therapy to strengthen  and stretch your muscles. Occupational therapy to help you with everyday tasks. Alternative or complementary treatments such as massage or acupuncture. Low-impact, mild exercises, such as swimming, walking, and yoga. Regular exercise can help alleviate symptoms and increase strength and balance. Follow these instructions at home: Medicines Take over-the-counter and prescription medicines only as told by your health care provider. Ask your health care provider if the medicine prescribed to you requires you to avoid driving or using machinery. Activity Use assistive devices as recommended by your physical therapist or your health care provider. Exercise as directed by your health care provider. Return to your normal activities as told by your health care provider. Ask your health care provider what activities are safe for you. General instructions Eating healthy can help manage MS symptoms. Reach out for support. Share your feelings with friends, family, or a support group. Keep all follow-up visits. This is important. Where to find more information National Multiple Sclerosis Society: www.nationalmssociety.[place] of Neurological Disorders and Stroke: MasterBoxes.it National Center for Complementary and Integrative Health: https://aguirre-king.net/ Contact a health care provider if: You feel depressed. You develop new pain or numbness. You have tremors. You have problems with sexual function. Get help right away if: You develop paralysis. You develop numbness. You have problems with your bladder or bowel function. You develop double vision. You lose vision in one or both eyes. You develop suicidal thoughts. You develop severe confusion. Get help right away if you feel like you may hurt yourself or others, or have thoughts about taking your own life. Go to your nearest emergency room or: Call 911. Call the [place] at [phone] or 988. This  is open 24 hours a day. Text the Crisis Text Line at 705-835-6631. Summary Multiple sclerosis (MS) is a disease of the central nervous system that causes the body's immune system to destroy the protective covering around nerves in the brain (myelin sheath). There are 3 types of MS: relapsing-remitting, secondary progressive, and primary progressive. There is no cure for MS, but medicines can help decrease the number and frequency of attacks and help relieve nuisance symptoms. Treatment may also include physical or occupational therapy. If you develop numbness, paralysis, vision problems, or other neurological symptoms, get help right away. This information is not intended to replace advice given to you by your health care provider. Make sure you discuss any questions you have with your health care provider. Document Revised: 10/18/2020 Document Reviewed: 10/18/2020 Elsevier Patient Education  [place].
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
## 2022-01-20 ENCOUNTER — Encounter: Payer: Self-pay | Admitting: Neurology

## 2022-01-20 LAB — B12 AND FOLATE PANEL
Folate: >20 ng/mL (ref >3.0)
Vitamin B-12: 1276 pg/mL — ABNORMAL HIGH (ref 232–1245)

## 2022-01-20 LAB — METHYLMALONIC ACID, SERUM: Methylmalonic Acid: 98 nmol/L (ref 0–378)

## 2022-02-24 ENCOUNTER — Other Ambulatory Visit: Payer: Self-pay | Admitting: Neurology

## 2022-02-24 ENCOUNTER — Ambulatory Visit
Admission: RE | Admit: 2022-02-24 | Discharge: 2022-02-24 | Disposition: A | Discharge status: Home / Self Care | Payer: Medicare Other | Source: Ambulatory Visit | Attending: Neurology | Admitting: Neurology

## 2022-02-24 DIAGNOSIS — F419 Anxiety disorder, unspecified: Secondary | ICD-10-CM

## 2022-02-24 DIAGNOSIS — E538 Deficiency of other specified B group vitamins: Secondary | ICD-10-CM

## 2022-02-24 DIAGNOSIS — R270 Ataxia, unspecified: Secondary | ICD-10-CM

## 2022-02-24 DIAGNOSIS — G379 Demyelinating disease of central nervous system, unspecified: Secondary | ICD-10-CM

## 2022-02-24 DIAGNOSIS — G35 Multiple sclerosis: Secondary | ICD-10-CM | POA: Diagnosis not present

## 2022-02-24 DIAGNOSIS — R9082 White matter disease, unspecified: Secondary | ICD-10-CM

## 2022-02-24 DIAGNOSIS — G9589 Other specified diseases of spinal cord: Secondary | ICD-10-CM | POA: Diagnosis not present

## 2022-02-24 DIAGNOSIS — M47812 Spondylosis without myelopathy or radiculopathy, cervical region: Secondary | ICD-10-CM | POA: Diagnosis not present

## 2022-02-24 DIAGNOSIS — M50221 Other cervical disc displacement at C4-C5 level: Secondary | ICD-10-CM | POA: Diagnosis not present

## 2022-02-26 ENCOUNTER — Emergency Department (HOSPITAL_BASED_OUTPATIENT_CLINIC_OR_DEPARTMENT_OTHER): Payer: Medicare Other

## 2022-02-26 ENCOUNTER — Encounter (HOSPITAL_BASED_OUTPATIENT_CLINIC_OR_DEPARTMENT_OTHER): Payer: Self-pay | Admitting: Emergency Medicine

## 2022-02-26 ENCOUNTER — Ambulatory Visit
Admission: EM | Admit: 2022-02-26 | Discharge: 2022-02-26 | Disposition: A | Discharge status: Home / Self Care | Payer: Medicare Other

## 2022-02-26 ENCOUNTER — Emergency Department (HOSPITAL_BASED_OUTPATIENT_CLINIC_OR_DEPARTMENT_OTHER)
Admission: EM | Admit: 2022-02-26 | Discharge: 2022-02-26 | Disposition: A | Discharge status: Home / Self Care | Payer: Medicare Other | Attending: Emergency Medicine | Admitting: Emergency Medicine

## 2022-02-26 ENCOUNTER — Emergency Department (HOSPITAL_BASED_OUTPATIENT_CLINIC_OR_DEPARTMENT_OTHER)
Admission: EM | Admit: 2022-02-26 | Discharge: 2022-02-26 | Disposition: A | Discharge status: Home / Self Care | Payer: Medicare Other | Source: Home / Self Care | Attending: Emergency Medicine | Admitting: Emergency Medicine

## 2022-02-26 DIAGNOSIS — R197 Diarrhea, unspecified: Secondary | ICD-10-CM | POA: Insufficient documentation

## 2022-02-26 DIAGNOSIS — J069 Acute upper respiratory infection, unspecified: Secondary | ICD-10-CM | POA: Insufficient documentation

## 2022-02-26 DIAGNOSIS — K625 Hemorrhage of anus and rectum: Secondary | ICD-10-CM | POA: Insufficient documentation

## 2022-02-26 DIAGNOSIS — J45909 Unspecified asthma, uncomplicated: Secondary | ICD-10-CM | POA: Insufficient documentation

## 2022-02-26 DIAGNOSIS — R55 Syncope and collapse: Secondary | ICD-10-CM | POA: Diagnosis not present

## 2022-02-26 DIAGNOSIS — R109 Unspecified abdominal pain: Secondary | ICD-10-CM | POA: Insufficient documentation

## 2022-02-26 DIAGNOSIS — R0981 Nasal congestion: Secondary | ICD-10-CM | POA: Diagnosis present

## 2022-02-26 DIAGNOSIS — Z7982 Long term (current) use of aspirin: Secondary | ICD-10-CM | POA: Insufficient documentation

## 2022-02-26 DIAGNOSIS — K219 Gastro-esophageal reflux disease without esophagitis: Secondary | ICD-10-CM | POA: Insufficient documentation

## 2022-02-26 DIAGNOSIS — R6889 Other general symptoms and signs: Secondary | ICD-10-CM

## 2022-02-26 DIAGNOSIS — Z1152 Encounter for screening for COVID-19: Secondary | ICD-10-CM | POA: Insufficient documentation

## 2022-02-26 DIAGNOSIS — Z20822 Contact with and (suspected) exposure to covid-19: Secondary | ICD-10-CM | POA: Insufficient documentation

## 2022-02-26 LAB — LIPASE, BLOOD: Lipase: 29 U/L (ref 11–51)

## 2022-02-26 LAB — CBC WITH DIFFERENTIAL/PLATELET
Abs Immature Granulocytes: 0.07 10*3/uL (ref 0.00–0.07)
Abs Immature Granulocytes: 0.04 10*3/uL (ref 0.00–0.07)
Basophils Absolute: 0.1 10*3/uL (ref 0.0–0.1)
Basophils Absolute: 0 10*3/uL (ref 0.0–0.1)
Basophils Relative: 1 %
Basophils Relative: 0 %
Eosinophils Absolute: 0.2 10*3/uL (ref 0.0–0.5)
Eosinophils Absolute: 0.2 10*3/uL (ref 0.0–0.5)
Eosinophils Relative: 2 %
Eosinophils Relative: 2 %
HCT: 39.1 % (ref 36.0–46.0)
HCT: 38.4 % (ref 36.0–46.0)
Hemoglobin: 13.2 g/dL (ref 12.0–15.0)
Hemoglobin: 12.9 g/dL (ref 12.0–15.0)
Immature Granulocytes: 1 %
Immature Granulocytes: 1 %
Lymphocytes Relative: 15 %
Lymphocytes Relative: 16 %
Lymphs Abs: 1.4 10*3/uL (ref 0.7–4.0)
Lymphs Abs: 1.3 10*3/uL (ref 0.7–4.0)
MCH: 29.5 pg (ref 26.0–34.0)
MCH: 29.7 pg (ref 26.0–34.0)
MCHC: 33.8 g/dL (ref 30.0–36.0)
MCHC: 33.6 g/dL (ref 30.0–36.0)
MCV: 87.5 fL (ref 80.0–100.0)
MCV: 88.3 fL (ref 80.0–100.0)
Monocytes Absolute: 1 10*3/uL (ref 0.1–1.0)
Monocytes Absolute: 0.8 10*3/uL (ref 0.1–1.0)
Monocytes Relative: 11 %
Monocytes Relative: 9 %
Neutro Abs: 6.4 10*3/uL (ref 1.7–7.7)
Neutro Abs: 6.1 10*3/uL (ref 1.7–7.7)
Neutrophils Relative %: 70 %
Neutrophils Relative %: 72 %
Platelets: 309 10*3/uL (ref 150–400)
Platelets: 324 10*3/uL (ref 150–400)
RBC: 4.47 MIL/uL (ref 3.87–5.11)
RBC: 4.35 MIL/uL (ref 3.87–5.11)
RDW: 13.9 % (ref 11.5–15.5)
RDW: 14 % (ref 11.5–15.5)
WBC: 9 10*3/uL (ref 4.0–10.5)
WBC: 8.4 10*3/uL (ref 4.0–10.5)
nRBC: 0 % (ref 0.0–0.2)
nRBC: 0 % (ref 0.0–0.2)

## 2022-02-26 LAB — COMPREHENSIVE METABOLIC PANEL
ALT: 26 U/L (ref 0–44)
ALT: 25 U/L (ref 0–44)
AST: 36 U/L (ref 15–41)
AST: 36 U/L (ref 15–41)
Albumin: 3.5 g/dL (ref 3.5–5.0)
Albumin: 3.3 g/dL — ABNORMAL LOW (ref 3.5–5.0)
Alkaline Phosphatase: 76 U/L (ref 38–126)
Alkaline Phosphatase: 73 U/L (ref 38–126)
Anion gap: 8 (ref 5–15)
Anion gap: 9 (ref 5–15)
BUN: 17 mg/dL (ref 8–23)
BUN: 15 mg/dL (ref 8–23)
CO2: 26 mmol/L (ref 22–32)
CO2: 24 mmol/L (ref 22–32)
Calcium: 9 mg/dL (ref 8.9–10.3)
Calcium: 8.6 mg/dL — ABNORMAL LOW (ref 8.9–10.3)
Chloride: 101 mmol/L (ref 98–111)
Chloride: 106 mmol/L (ref 98–111)
Creatinine, Ser: 0.77 mg/dL (ref 0.44–1.00)
Creatinine, Ser: 0.69 mg/dL (ref 0.44–1.00)
GFR, Estimated: >60 mL/min (ref >60)
GFR, Estimated: >60 mL/min (ref >60)
Glucose, Bld: 105 mg/dL — ABNORMAL HIGH (ref 70–99)
Glucose, Bld: 127 mg/dL — ABNORMAL HIGH (ref 70–99)
Potassium: 4.3 mmol/L (ref 3.5–5.1)
Potassium: 3.7 mmol/L (ref 3.5–5.1)
Sodium: 135 mmol/L (ref 135–145)
Sodium: 139 mmol/L (ref 135–145)
Total Bilirubin: 0.5 mg/dL (ref 0.3–1.2)
Total Bilirubin: 0.2 mg/dL — ABNORMAL LOW (ref 0.3–1.2)
Total Protein: 7.4 g/dL (ref 6.5–8.1)
Total Protein: 7.1 g/dL (ref 6.5–8.1)

## 2022-02-26 LAB — RESP PANEL BY RT-PCR (RSV, FLU A&B, COVID)  RVPGX2
Influenza A by PCR: NEGATIVE
Influenza B by PCR: NEGATIVE
Resp Syncytial Virus by PCR: NEGATIVE
SARS Coronavirus 2 by RT PCR: NEGATIVE

## 2022-02-26 LAB — APTT: aPTT: 25 seconds (ref 24–36)

## 2022-02-26 LAB — PROTIME-INR
INR: 1 (ref 0.8–1.2)
Prothrombin Time: 13.5 seconds (ref 11.4–15.2)

## 2022-02-26 MED ORDER — SODIUM CHLORIDE 0.9 % IV BOLUS
1000.0000 mL | Freq: Once | INTRAVENOUS | Status: AC
  Administered 2022-02-26: 1000 mL via INTRAVENOUS

## 2022-02-26 MED ORDER — IOHEXOL 300 MG/ML  SOLN
100.0000 mL | Freq: Once | INTRAMUSCULAR | Status: AC | PRN
  Administered 2022-02-26: 100 mL via INTRAVENOUS

## 2022-02-26 MED ORDER — LORATADINE 10 MG PO TABS: 10.0000 mg | ORAL_TABLET | Freq: Every day | ORAL | 0 refills | Status: DC

## 2022-02-26 MED ORDER — GUAIFENESIN 100 MG/5ML PO LIQD: 100.0000 mg | ORAL | 0 refills | Status: DC | PRN

## 2022-02-26 NOTE — ED Notes (Signed)
Pt states she was helping her sister take care of her sick grandchildren  that had colds and  then 2 days later she started to feel bad

## 2022-02-26 NOTE — ED Notes (Signed)
Pt color WNL, capillary refill WNL, radial pulses easily noted, GCS 15

## 2022-02-26 NOTE — ED Triage Notes (Signed)
Pt seen earlier today and is back because after she was discharged she had 4 episodes of rectal bleeding; hx of GI bleed a few yrs ago

## 2022-02-26 NOTE — ED Provider Notes (Signed)
[facility] Provider Note   CSN: [id number] Arrival date & time: 02/26/22  4098     History  Chief Complaint  Patient presents with   URI    Mia Peterson is a 77 y.o. female with Hx of depression, GERD, prior TIA, ischemic colitis, OSA, prior rectal bleed presenting to the ED today due to several days of URI symptoms and new syncopal episode this morning.  URI symptoms include congestion and excessive sneezing and began 02/18/2022.  Had 2 hours of diarrhea this morning, followed by a syncopal episode in the bathroom.  Hx of at least 10 times syncope, known Hx of vasovagal etiology.  Now feels weak.  Has had trouble keeping down food and fluids.  No known recent sick contacts, was with her sick grandchildren 2 days before she developed symptoms.  Denies shortness of breath, chest pain, palpitations, headache, vision changes.  Denies N/V or abdominal pain.  Denies bloody bowel movements.  Has only taken aspirin and ibuprofen without much relief.  No Hx of asthma or COPD.  The history is provided by the patient and medical records.      Home Medications Prior to Admission medications   Medication Sig Start Date End Date Taking? Authorizing Provider  guaiFENesin (ROBITUSSIN) 100 MG/5ML liquid Take 5-10 mLs (100-200 mg total) by mouth every 4 (four) hours as needed for cough or to loosen phlegm. 02/26/22  Yes Prince Rome, PA-C  loratadine (CLARITIN) 10 MG tablet Take 1 tablet (10 mg total) by mouth daily. One po daily x 5 days 02/26/22  Yes Prince Rome, PA-C  aspirin EC 81 MG tablet Take 81 mg by mouth daily.    [provider]  atorvastatin (LIPITOR) 40 MG tablet TAKE 1 TABLET DAILY 03/07/21   Burchette, [name] Sierras, MD  calcium-vitamin D (OSCAL WITH D) 500-200 MG-UNIT TABS tablet Take by mouth.    [provider]  cycloSPORINE (RESTASIS) 0.05 % ophthalmic emulsion Place 1 drop into both eyes 2 (two) times daily.    [provider]  Flaxseed, Linseed, (FLAX SEED OIL) 1000 MG CAPS Take 1,000 mg by mouth daily.    [provider]  Ginger, Zingiber officinalis, (GINGER ROOT) 550 MG CAPS Take 550 mg by mouth daily.    [provider]  MELOXICAM PO Take by mouth.    [provider]  Multiple Vitamins-Minerals (ONE DAILY WOMENS 50+ PO) Take 1 tablet by mouth daily.    [provider]  omeprazole (PRILOSEC) 20 MG capsule Take by mouth.    [provider]  oxybutynin (DITROPAN-XL) 5 MG 24 hr tablet Take 5 mg by mouth at bedtime.    [provider]  POTASSIUM PO Take 1 tablet by mouth daily.    [provider]  TURMERIC PO Take 400 mg by mouth 2 (two) times daily.     [provider]      Allergies    Other and Adhesive [tape]    Review of Systems   Review of Systems  HENT:  Positive for congestion and sneezing.   Gastrointestinal:  Positive for diarrhea.  Neurological:  Positive for syncope.    Physical Exam Updated Vital Signs BP (!) 152/92 (BP Location: Right Arm)   Pulse (!) 57   Temp (!) 97.5 F (36.4 C) (Oral)   Resp 16   Ht '5\' 8"'$  (1.727 m)   Wt 68 kg   SpO2 100%   BMI 22.81 kg/m  Physical Exam Vitals and nursing note reviewed.  Constitutional:      General: She is not in acute distress.    Appearance: She is well-developed. She is not ill-appearing, toxic-appearing or diaphoretic.     Comments: Dry mucous membranes, appears clinically dehydrated.  HENT:     Head: Normocephalic and atraumatic.     Right Ear: External ear normal.     Left Ear: External ear normal.     Nose: Congestion and rhinorrhea present.     Mouth/Throat:     Pharynx: Oropharynx is clear.  Eyes:     Conjunctiva/sclera: Conjunctivae normal.  Cardiovascular:     Rate and Rhythm: Normal rate and regular rhythm.     Pulses: Normal pulses.     Heart sounds: Normal heart sounds. No murmur heard. Pulmonary:     Effort: Pulmonary effort is normal.  No respiratory distress.     Breath sounds: Normal breath sounds. No stridor. No wheezing, rhonchi or rales.  Chest:     Chest wall: No tenderness.  Abdominal:     General: There is no distension.     Palpations: Abdomen is soft.     Tenderness: There is no abdominal tenderness. There is no guarding.  Musculoskeletal:        General: No swelling.     Cervical back: Neck supple. No rigidity or tenderness.     Right lower leg: No edema.     Left lower leg: No edema.  Skin:    General: Skin is warm and dry.     Capillary Refill: Capillary refill takes less than 2 seconds.     Coloration: Skin is not jaundiced or pale.  Neurological:     Mental Status: She is alert and oriented to person, place, and time.     Sensory: No sensory deficit.     Motor: No weakness.     Coordination: Coordination normal.  Psychiatric:        Mood and Affect: Mood normal.     ED Results / Procedures / Treatments   Labs (all labs ordered are listed, but only abnormal results are displayed) Labs Reviewed  COMPREHENSIVE METABOLIC PANEL - Abnormal; Notable for the following components:      Result Value   Glucose, Bld 105 (*)    All other components within normal limits  RESP PANEL BY RT-PCR (RSV, FLU A&B, COVID)  RVPGX2  CBC WITH DIFFERENTIAL/PLATELET    EKG EKG Interpretation  Date/Time:  Tuesday February 26 2022 10:02:57 EST Ventricular Rate:  53 PR Interval:  192 QRS Duration: 101 QT Interval:  451 QTC Calculation: 424 R Axis:   75 Text Interpretation: Sinus rhythm Confirmed by Nanda Quinton [phone]) on 02/26/2022 10:13:48 AM  Radiology DG Chest Port 1 View  Result Date: 02/26/2022 CLINICAL DATA:  A 77 year old female presents for evaluation of upper respiratory symptoms. Syncopal episode. EXAM: PORTABLE CHEST 1 VIEW COMPARISON:  October 26, 2015 FINDINGS: Trachea midline. Cardiomediastinal contours and hilar structures are normal. Lungs are clear. No effusion. No consolidation. No pneumothorax.  Added density over the lower chest bilaterally due to breast implants. On limited assessment no acute skeletal process. IMPRESSION: No acute cardiopulmonary disease. Electronically Signed   By: Zetta Bills M.D.   On: 02/26/2022 09:50    Procedures Procedures    Medications Ordered in ED Medications  sodium chloride 0.9 % bolus 1,000 mL (1,000 mLs Intravenous New Bag/Given 02/26/22 8469)    ED Course/ Medical Decision Making/ A&P  Medical Decision Making Amount and/or Complexity of Data Reviewed Labs: ordered. Radiology: ordered.  Risk OTC drugs.   77 y.o. female presents to the ED for concern of URI  Past Medical History / Co-morbidities / Social History: Hx of depression, GERD, prior TIA, ischemic colitis, OSA, prior rectal bleed Social Determinants of Health include: Geriatric  Additional History:  Obtained by chart review.  Notably Internal medicine messages, see for details  Lab Tests: I ordered, and personally interpreted labs.  The pertinent results include:   WBC 9.0, no leukocytosis.  No evidence of acute anemia, H&H stable  No evidence of electrolyte derangement, AKI, or abnormal LFTs. COVID, flu, RSV negative  Imaging Studies: I ordered imaging studies including CXR.   I independently visualized and interpreted imaging which showed no acute cardiopulmonary pathology I agree with the radiologist interpretation. I personally ordered and interpreted EKG 12 lead findings, which showed: Normal sinus rhythm without evidence of ischemia, infarction, arrhythmia.  ED Course: Presenting to the ED following a syncopal episode and several types of URI symptoms.  Well-known hx of vasovagal syncope.  Patient had 2 hours of diarrhea prior to syncopal episode today.  Also with 10 days of URI symptoms including congestion, excessive sneezing, and mild nonproductive cough.  Lost consciousness for "only a second" without postictal period.  Describes aura  sensation exactly the same to prior vasovagal syncope events.  Did not hit head and without evidence of head trauma.  Not on anticoagulation.  Pt states she avidly checked for blood and stool per rectum with prior hx of GI bleed, and reports none present.  Did take one aspirin and some ibuprofen for URI symptoms and body aches. Pt overall well-appearing though mildly clinically dehydrated on exam.  Somewhat dry mucous membranes with mildly decreased skin turgor.  Sitting comfortably, AAOx4, ABCs intact.  Lungs CTAB, without chest pain/discomfort or shortness of breath.  Satting at 100% on room air.  Hemodynamically stable.  Good air movement without wheezing or rales.  No accessory muscle use, tripoding, drooling, nasal flaring, or retractions appreciated.  CXR not suggestive of pneumothorax or pneumonia.   Wells criteria of 0.  Low risk PE.  EKG unremarkable.  Mild bradycardia, pt states this is her baseline.  Low suspicion ACS.  No Hx of asthma or COPD.  Well's criteria 0, low risk PE.  Lower extremities without clinical suggestion of DVT. Pt afebrile without tonsillar exudate, erythema, or swelling.  Low clinical suspicion for strep pharyngitis.  Without cervical lymphadenopathy or dysphagia.  Presentation non concerning for PTA or RPA.  No trismus or uvula deviation.  Pt able to drink water in ED without difficulty with intact air way.    Labs reviewed, no evidence of anemia.  No leukocytosis.  No electrolyte derangement.  No AKI.  Negative for Covid, Flu, RSV. Clinical diagnosis of viral pharyngitis with likely vasovagal syncopal event, suspect due to dehydration and diarrhea.  Plan to provide IV fluids and reassess.  No diarrhea since prior to arrival to ED. Upon re-evaluation, pt reports significant improvement.  Appears more hydrated.  No abx indicated.  Discussed importance of water rehydration.  Recommended PCP follow up and conservative symptom management.  Patient in NAD and in good condition at  time of discharge.   Disposition: After consideration the patient's encounter today, I do not feel today's workup suggests an emergent condition requiring admission or immediate intervention beyond what has been performed at this time.  Safe for discharge; instructed to return immediately for  worsening symptoms, change in symptoms or any other concerns.  I have reviewed the patients home medicines and have made adjustments as needed.  Discussed course of treatment with the patient, whom demonstrated understanding.  Patient in agreement and has no further questions.    I discussed this case with my attending physician Dr. Laverta Baltimore, who agreed with the proposed treatment course and cosigned this note.  Attending physician stated agreement with plan or made changes to plan which were implemented.    This chart was dictated using voice recognition software.  Despite best efforts to proofread, errors can occur which can change the documentation meaning.         Final Clinical Impression(s) / ED Diagnoses Final diagnoses:  Syncope, unspecified syncope type  Upper respiratory tract infection, unspecified type    Rx / DC Orders ED Discharge Orders          Ordered    loratadine (CLARITIN) 10 MG tablet  Daily        02/26/22 1017    guaiFENesin (ROBITUSSIN) 100 MG/5ML liquid  Every 4 hours PRN        02/26/22 1017              Dorise Bullion [place], [place] 79/39/03 0846    Margette Fast, MD 03/05/22 [phone]

## 2022-02-26 NOTE — ED Triage Notes (Signed)
Pt presents with c/o URI sx that began 12/25, pt states this morning diarrhea began and she had syncopal episode in her bath room. Pt expresses extreme fatigue.

## 2022-02-26 NOTE — ED Notes (Signed)
States when previously DC from our facility earlier, after getting into her car, she states she began having abd pain and cramping, went home and had bright red bloody stools, so has returned for further evaluation.

## 2022-02-26 NOTE — ED Notes (Signed)
Patient's significant other has breached doors from lobby to nurses station X2 while patient has been in the waiting room. Inquiring when patient will be placed in a room as she has been in the lobby for 2 hours... Explained reason for delay to significant other and apologized for the delay. Requested to significant other that if he has a question please ask lobby staff and to not breach through doors again as it is a safety concern.

## 2022-02-26 NOTE — Discharge Instructions (Addendum)
You were seen in the emergency department today for syncopal episode following diarrhea and 10 days of upper respiratory infeciton.  We have tested you for covid, flu, and RSV and these results were negative.  However, it is still likely that your symptoms are related to a different viral illness.   Treatment is directed at relieving symptoms.  There is no cure for acute viral infections; it is usually best to let them run their course.  Symptoms usually last around 1-2 weeks, though cough may occasionally linger for up to 3-4 weeks.  As discussed, antibiotics are not effective with viral infections, because the infection is caused by a virus, not by bacteria.   Treatments may include:  Increased fluid intake. Sports drinks offer valuable electrolytes, sugars, and fluids.  Breathing heated mist or steam (vaporizer or shower).  Eating chicken soup or other clear broths, and maintaining good nutrition.  Getting plenty of rest.  Using lozenges, tea with honey, or warm salt water for cough relief/sore throat relief (Cepkaol or Halls lozenges are available over-the-counter) Increasing usage of your inhaler if you have asthma.   Take over-the-counter Benadryl, Zyrtec, or Claritin to decrease sinus secretions, with Mucinex to help break up remaining mucus Continue to utilize Tylenol for pain and fever control.  You may return to work 24 hours after your temperature has returned to normal.  Please follow up with your primary care doctor in 3-5 days for recheck of ongoing symptoms.  Return to emergency department for emergent changing or worsening of symptoms.

## 2022-02-26 NOTE — ED Provider Triage Note (Signed)
Emergency Medicine Provider Triage Evaluation Note  Mia Peterson , a 77 y.o. female  was evaluated in triage.  Pt returns to ED due to rectal bleeding.  States it started immediately after she left the ED this morning.  Hx of GI bleed.  Bleeding described as small clots about the size of a quarter each time.  Not passing stool.  Mild abdominal pain.  No syncope since this morning with diarrhea episode.  Denies fever, chest pain, shortness of breath.  Review of Systems  Positive:  Negative: See above  Physical Exam  BP (!) 149/83 (BP Location: Right Arm)   Pulse 67   Temp 97.8 F (36.6 C)   Resp 20   SpO2 99%  Gen:   Awake, no distress   Resp:  Normal effort  MSK:   Moves extremities without difficulty  Other:  Sitting comfortably.  Abdomen soft, mild periumbilical tenderness.  Non distended.  Gait intact.  Medical Decision Making  Medically screening exam initiated at 3:36 PM.  Appropriate orders placed.  LUPIE SAWA was informed that the remainder of the evaluation will be completed by another provider, this initial triage assessment does not replace that evaluation, and the importance of remaining in the ED until their evaluation is complete.  Labs ordered   Prince Rome, Hershal Coria 87/[birth year]/76 1540

## 2022-02-26 NOTE — ED Notes (Signed)
Patient is being discharged from the Urgent Care and sent to the Emergency Department via POV driven by husband. Per Dr Meda Coffee, patient is in need of higher level of care due to syncopal episode this morning while in bathroom. Patient is aware and verbalizes understanding of plan of care.  Vitals:   02/26/22 0812  BP: 122/81  Pulse: 61  Resp: 14  Temp: 97.9 F (36.6 C)  SpO2: 99%

## 2022-02-26 NOTE — ED Provider Notes (Signed)
[facility] Provider Note   CSN: [id number] Arrival date & time: 02/26/22  1300     History  Chief Complaint  Patient presents with   Rectal Bleeding    Mia Peterson is a 77 y.o. female with vertigo, vasovagal syncope, GERD, asthma, arthritis presents with rectal bleeding.  Per chart review patient was seen by her family practice physician this morning after syncope in the setting of multiple episodes of diarrhea and a upper respiratory infection illness.  Family practice physician sent her to [facility] where she was overall well-appearing, stable vitals, received IV fluids, likely vasovagal in etiology in the setting of diarrhea, felt better and was discharged.  Immediate upon leaving the emergency department she described for episodes of rectal bleeding described as small clots about the size of a quarter each time. Not passing stool. Mild abdominal pain. No syncope since this morning with diarrhea episode.  Denies fever, chest pain, shortness of breath. She does feel some lightheadedness. Does not take a blood thinner.   Rectal Bleeding      Home Medications Prior to Admission medications   Medication Sig Start Date End Date Taking? Authorizing Provider  aspirin EC 81 MG tablet Take 81 mg by mouth daily.    [provider]  atorvastatin (LIPITOR) 40 MG tablet TAKE 1 TABLET DAILY 03/07/21   Burchette, [name] Sierras, MD  calcium-vitamin D (OSCAL WITH D) 500-200 MG-UNIT TABS tablet Take by mouth.    [provider]  cycloSPORINE (RESTASIS) 0.05 % ophthalmic emulsion Place 1 drop into both eyes 2 (two) times daily.    [provider]  Flaxseed, Linseed, (FLAX SEED OIL) 1000 MG CAPS Take 1,000 mg by mouth daily.    [provider]  Ginger, Zingiber officinalis, (GINGER ROOT) 550 MG CAPS Take 550 mg by mouth daily.    [provider]  guaiFENesin (ROBITUSSIN) 100 MG/5ML liquid Take 5-10 mLs (100-200 mg  total) by mouth every 4 (four) hours as needed for cough or to loosen phlegm. 6/0/73   Prince Rome, PA-C  loratadine (CLARITIN) 10 MG tablet Take 1 tablet (10 mg total) by mouth daily. One po daily x 5 days 08/25/04   Prince Rome, PA-C  MELOXICAM PO Take by mouth.    [provider]  Multiple Vitamins-Minerals (ONE DAILY WOMENS 50+ PO) Take 1 tablet by mouth daily.    [provider]  omeprazole (PRILOSEC) 20 MG capsule Take by mouth.    [provider]  oxybutynin (DITROPAN-XL) 5 MG 24 hr tablet Take 5 mg by mouth at bedtime.    [provider]  POTASSIUM PO Take 1 tablet by mouth daily.    [provider]  TURMERIC PO Take 400 mg by mouth 2 (two) times daily.     [provider]      Allergies    Other and Adhesive [tape]    Review of Systems   Review of Systems  Gastrointestinal:  Positive for hematochezia.   Review of systems Negative for f/c.  A 10 point review of systems was performed and is negative unless otherwise reported in HPI.  Physical Exam Updated Vital Signs BP (!) 165/99 (BP Location: Right Arm)   Pulse 84   Temp 97.8 F (36.6 C)   Resp 18   SpO2 100%  Physical Exam General: Normal appearing female, lying in bed.  HEENT: Sclera anicteric, MMM, trachea midline.  Cardiology: RRR, no murmurs/rubs/gallops. BL radial and  DP pulses equal bilaterally.  Resp: Normal respiratory rate and effort. CTAB, no wheezes, rhonchi, crackles.  Abd: Soft, non-tender, non-distended. No rebound tenderness or guarding.  GU: No external hemorrhoids visualized on exam. No anal fissure. No erythema/induration of anus. Internal exam without any palpable hemorrhoids, induration/fluctuance, masses, or foreign bodies. No significant tenderness to palpation of rectum. No gross blood on glove. MSK: No peripheral edema or signs of trauma. Extremities without deformity or TTP. No cyanosis or clubbing. Skin: warm, dry. No rashes or  lesions. Back: No CVA tenderness Neuro: A&Ox4, CNs II-XII grossly intact. MAEs. Sensation grossly intact.  Psych: Normal mood and affect.   ED Results / Procedures / Treatments   Labs (all labs ordered are listed, but only abnormal results are displayed) Labs Reviewed  COMPREHENSIVE METABOLIC PANEL - Abnormal; Notable for the following components:      Result Value   Glucose, Bld 127 (*)    Calcium 8.6 (*)    Albumin 3.3 (*)    Total Bilirubin 0.2 (*)    All other components within normal limits  CBC WITH DIFFERENTIAL/PLATELET  LIPASE, BLOOD  APTT  PROTIME-INR    EKG None  Radiology DG Chest Port 1 View  Result Date: 02/26/2022 CLINICAL DATA:  A 77 year old female presents for evaluation of upper respiratory symptoms. Syncopal episode. EXAM: PORTABLE CHEST 1 VIEW COMPARISON:  October 26, 2015 FINDINGS: Trachea midline. Cardiomediastinal contours and hilar structures are normal. Lungs are clear. No effusion. No consolidation. No pneumothorax. Added density over the lower chest bilaterally due to breast implants. On limited assessment no acute skeletal process. IMPRESSION: No acute cardiopulmonary disease. Electronically Signed   By: Zetta Bills M.D.   On: 02/26/2022 09:50 \\   Procedures Procedures    Medications Ordered in ED Medications  iohexol (OMNIPAQUE) 300 MG/ML solution 100 mL (100 mLs Intravenous Contrast Given 02/26/22 1652)    ED Course/ Medical Decision Making/ A&P                          Medical Decision Making Amount and/or Complexity of Data Reviewed Labs: ordered. Decision-making details documented in ED Course. Radiology:  Decision-making details documented in ED Course.    This patient presents to the ED for concern of rectal bleeding, this involves an extensive number of treatment options, and is a complaint that carries with it a high risk of complications and morbidity.  I considered the following differential and admission for this acute,  potentially life threatening condition.   MDM:    Based on clinical presentation, hematochezia reported and clinical exam history and findings, most concerned about LGIB in s/o presumed viral illness. Consider hemorrhoids, anal fissure, diverticulitis/diverticulosis, ischemic colitis, IBD, AVMs or Dieulafoy lesions, infectious colitis, malignancy. Patient has no hemorrhoids, anal fissure, or rectal foreign body noted on exam. Her clinical history could possibly be consistent with ischemic colitis though she had a CT this AM that was remarkable for diverticulosis wo diverticulitis. Most likely patient has infectious colitis or diverticular bleeding. Considered GI hemorrhage and patient does feel slightly lightheaded, however patient has only passed a few small clots, is vitally stable without tachycardia, well-appearing, has not had any further BMs in the ED. Will CTM. Consider acute blood loss anemia and will recheck a hemoglobin from earlier. Will also check orthostatic vitals. Consider bleeding diathesis such as thrombocytopenia, will obtain coags, but patient does not  take a blood thinner. Patient is HDS, well-appearing, will check labs and if  she remains stable she is able to f/u as o/p.     Clinical Course as of 03/14/22 2008  Tue Feb 26, 2022  1652 INR: 1.0 [HN]  1652 Prothrombin Time: 13.5 [HN]  1652 APTT: 25 [HN]  1652 Lipase: 29 [HN]  1652 Hemoglobin: 12.9 BL 12-13, earlier today was 13.2, stable [HN]  1652 Platelets: 324 No thrombocytopenia [HN]  1652 Comprehensive metabolic panel(!) Unremarkable in context [HN]  1739 CT Abdomen Pelvis W Contrast 1. Colonic diverticulosis with no acute diverticulitis. 2. Tiny hiatal hernia.   [HN]  7494 Patient with abdominal cramping same as this morning. Orthostatic vitals okay. [HN]    Clinical Course User Index [HN] Audley Hose, MD    Labs: I Ordered, and personally interpreted labs.  The pertinent results include:  those listed  above  Imaging Studies ordered: I ordered imaging studies including CT A/P I independently visualized and interpreted imaging. I agree with the radiologist interpretation  Additional history obtained from chart review  Reevaluation: After the interventions noted above, I reevaluated the patient and found that they have :improved  Social Determinants of Health: Patient lives independently   Disposition:  Patient has remained stable, orthostatic vitals negative, lab w/u reassuring, Hgb stable from earlier. No further GIB BMs here in the ED, observed for >5 hours. Patient will be discharged in stable condition with instructions for PCP/GI f/u. Given DC instructions/return precautions, all questions answered to patient satisfaction.   Co morbidities that complicate the patient evaluation  Past Medical History:  Diagnosis Date   Arthritis    right knee and foot- OSteo    Asthma    treated in 20's and 67's   Cancer ([place])    skin- squamous basal   Cataract    Complication of anesthesia    very slow to awaken    GERD (gastroesophageal reflux disease)    prn Prilosec   Head injury, closed    10/26/15- years ago - 30ish   Shortness of breath dyspnea    with exertion   Vaso vagal episode    passed out 2 times, close to passing out 3 times   Vertigo    was treated with rehab March and April 2017, does exercises if she begins to have symptoms     Medicines No orders of the defined types were placed in this encounter.   I have reviewed the patients home medicines and have made adjustments as needed  Problem List / ED Course: Problem List Items Addressed This Visit       Digestive   Rectal bleeding - Primary                This note was created using dictation software, which may contain spelling or grammatical errors.    Audley Hose, MD 03/17/22 [phone]

## 2022-02-26 NOTE — ED Triage Notes (Signed)
Sick since christmas saw [facility] today and had some diarrhea

## 2022-02-26 NOTE — ED Provider Notes (Signed)
[facility]    CSN: [id number] Arrival date & time: 02/26/22  0801      History   Chief Complaint Chief Complaint  Patient presents with   Diarrhea   Loss of Consciousness    HPI Mia Peterson is a 77 y.o. female.   HPI  Patient is here for an upper respiratory infection that is been going on since 12/25.  She has had cough and cold and severe fatigue and bodyaches since that time.  This is her first visit.  In the last couple days she has developed nausea and decreased appetite.  Today she has had multiple episodes of diarrhea.  She states that after diarrhea she had a weak sensation so she sat on the bathroom floor.  She woke up on the bathroom floor "moments later".  And realized that she had fainted.  She does have a history of fainting spells. Has a history of TIA.  She is on Lipitor.  She has GERD well-controlled.  Recently evaluated for ataxia and was found to have some white matter abnormalities by neurology.   Past Medical History:  Diagnosis Date   Arthritis    right knee and foot- OSteo    Asthma    treated in 20's and 74's   Cancer ([place])    skin- squamous basal   Cataract    Complication of anesthesia    very slow to awaken    GERD (gastroesophageal reflux disease)    prn Prilosec   Head injury, closed    10/26/15- years ago - 30ish   Shortness of breath dyspnea    with exertion   Vaso vagal episode    passed out 2 times, close to passing out 3 times   Vertigo    was treated with rehab March and April 2017, does exercises if she begins to have symptoms    Patient Active Problem List   Diagnosis Date Noted   OSA (obstructive sleep apnea) 07/03/2021   Primary osteoarthritis involving multiple joints 05/30/2021   Somnolence 01/26/2021   Diverticular disease of colon 03/21/2020   Fecal urgency 03/21/2020   First degree hemorrhoids 03/21/2020   Incontinence of feces 03/21/2020   Ischemic colitis ([place]) 03/21/2020   Nausea 03/21/2020    Screening for malignant neoplasm of colon 03/21/2020   Osteopenia 04/13/2018   Dupuytren contracture 04/13/2018   Degenerative arthritis 04/13/2018   History of TIA (transient ischemic attack) 03/10/2017   Rectal bleeding 02/02/2017   Rectal bleed 02/01/2017   TIA (transient ischemic attack) 09/30/2016   Primary osteoarthritis of right knee 11/03/2015   Basal cell cancer 11/23/2010   Squamous cell skin cancer 11/23/2010   HEMATURIA UNSPECIFIED 10/09/2009   ROSACEA 11/11/2008   URGENCY OF URINATION 11/11/2008   POSTMENOPAUSAL STATUS 11/11/2008   DEPRESSIVE DISORDER NOT ELSEWHERE CLASSIFIED 07/27/2008   GERD 07/27/2008    Past Surgical History:  Procedure Laterality Date   ABDOMINAL HYSTERECTOMY  1993   menorrhagia.   BREAST ENHANCEMENT SURGERY  1978   CATARACT EXTRACTION, BILATERAL     COLONOSCOPY W/ POLYPECTOMY     COLONOSCOPY WITH PROPOFOL N/A 02/03/2017   Procedure: COLONOSCOPY WITH PROPOFOL;  Surgeon: Carol Ada, MD;  Location: [facility];  Service: Endoscopy;  Laterality: N/A;   KNEE ARTHROSCOPY W/ MENISCAL REPAIR Right    right knee replaceent      TOTAL KNEE ARTHROPLASTY Right 11/03/2015   Procedure: RIGHT TOTAL KNEE ARTHROPLASTY;  Surgeon: Dorna Leitz, MD;  Location: [facility];  Service: Orthopedics;  Laterality: Right;   TUBAL LIGATION  1972   VARICOSE VEIN SURGERY  1978    OB History   No obstetric history on file.      Home Medications    Prior to Admission medications   Medication Sig Start Date End Date Taking? Authorizing Provider  aspirin EC 81 MG tablet Take 81 mg by mouth daily.    [provider]  atorvastatin (LIPITOR) 40 MG tablet TAKE 1 TABLET DAILY 03/07/21   Burchette, [name] Sierras, MD  calcium-vitamin D (OSCAL WITH D) 500-200 MG-UNIT TABS tablet Take by mouth.    [provider]  cycloSPORINE (RESTASIS) 0.05 % ophthalmic emulsion Place 1 drop into both eyes 2 (two) times daily.    [provider]  Flaxseed, Linseed, (FLAX  SEED OIL) 1000 MG CAPS Take 1,000 mg by mouth daily.    [provider]  Ginger, Zingiber officinalis, (GINGER ROOT) 550 MG CAPS Take 550 mg by mouth daily.    [provider]  MELOXICAM PO Take by mouth.    [provider]  Multiple Vitamins-Minerals (ONE DAILY WOMENS 50+ PO) Take 1 tablet by mouth daily.    [provider]  omeprazole (PRILOSEC) 20 MG capsule Take by mouth.    [provider]  oxybutynin (DITROPAN-XL) 5 MG 24 hr tablet Take 5 mg by mouth at bedtime.    [provider]  POTASSIUM PO Take 1 tablet by mouth daily.    [provider]  TURMERIC PO Take 400 mg by mouth 2 (two) times daily.     [provider]    Family History Family History  Problem Relation Age of Onset   Heart disease Mother 45   Cancer Father 70       pancreatic   Ataxia Neg Hx     Social History Social History   Tobacco Use   Smoking status: Former    Packs/day: 1.00    Years: 20.00    Total pack years: 20.00    Types: Cigarettes    Start date: 67    Quit date: 03/24/1990    Years since quitting: 31.9   Smokeless tobacco: Never  Vaping Use   Vaping Use: Never used  Substance Use Topics   Alcohol use: Yes    Alcohol/week: 7.0 standard drinks of alcohol    Types: 7 Glasses of wine per week    Comment: 3 times week   Drug use: No     Allergies   Other and Adhesive [tape]   Review of Systems Review of Systems See HPI  Physical Exam Triage Vital Signs ED Triage Vitals  Enc Vitals Group     BP 02/26/22 0812 122/81     Pulse Rate 02/26/22 0812 61     Resp 02/26/22 0812 14     Temp 02/26/22 0812 97.9 F (36.6 C)     Temp Source 02/26/22 0812 Oral     SpO2 02/26/22 0812 99 %     Weight --      Height --      Head Circumference --      Peak Flow --      Pain Score 02/26/22 0811 0     Pain Loc --      Pain Edu? --      Excl. in [place]? --    No data found.  Updated Vital Signs BP 122/81 (BP Location:  Right Arm)   Pulse 61   Temp 97.9 F (36.6  C) (Oral)   Resp 14   SpO2 99%        Physical Exam Constitutional:      General: She is not in acute distress.    Appearance: She is well-developed. She is ill-appearing.  HENT:     Head: Normocephalic and atraumatic.  Eyes:     Conjunctiva/sclera: Conjunctivae normal.     Pupils: Pupils are equal, round, and reactive to light.  Cardiovascular:     Rate and Rhythm: Normal rate and regular rhythm.     Heart sounds: Normal heart sounds.  Pulmonary:     Effort: Pulmonary effort is normal. No respiratory distress.     Breath sounds: Normal breath sounds.  Abdominal:     General: There is no distension.     Palpations: Abdomen is soft.  Musculoskeletal:        General: Normal range of motion.     Cervical back: Normal range of motion.  Skin:    General: Skin is warm and dry.  Neurological:     Mental Status: She is alert.  Psychiatric:     Comments: Patient states mentally she feels "foggy".  She states that she excellently walked into the wrong room and did not realize it, feels like her mentation is slowed.  Very fatigued.      UC Treatments / Results  Labs (all labs ordered are listed, but only abnormal results are displayed) Labs Reviewed - No data to display  EKG   Radiology   Procedures Procedures (including critical care time)  Medications Ordered in UC Medications - No data to display  Initial Impression / Assessment and Plan / UC Course  I have reviewed the triage vital signs and the nursing notes.  Pertinent labs & imaging results that were available during my care of the patient were reviewed by me and considered in my medical decision making (see chart for details).     I explained to the patient that I feel she needs a higher level of care.  I recommended she go to the emergency department.  Her husband is going to take her.  She is hemodynamically stable for travel Final Clinical Impressions(s) / UC  Diagnoses   Final diagnoses:  Diarrhea, unspecified type  Syncope, unspecified syncope type  Flu-like symptoms     Discharge Instructions      I recommend that you be evaluated in an ER for a more comprehensive evauation   ED Prescriptions   None    PDMP not reviewed this encounter.   Raylene Everts, MD 02/26/22 [phone]

## 2022-02-26 NOTE — Discharge Instructions (Signed)
I recommend that you be evaluated in an ER for a more comprehensive evauation

## 2022-02-26 NOTE — Discharge Instructions (Signed)
Thank you for coming to [facility]. You were seen for rectal bleeding. We did an exam, labs, and imaging, and these showed diverticula (small outpouchings in the colon) that can bleed sometimes. It is possible you have a diverticular bleed or infectious diarrhea.  Please follow up with your primary care provider within 1 week.   Do not hesitate to return to the ED or call 911 if you experience: -Worsening symptoms -Worsening rectal bleeding -Lightheadedness, passing out -Fevers/chills -Anything else that concerns you

## 2022-02-26 NOTE — ED Notes (Signed)
Orthostatic vitals completed per order. Pt reports no dizziness other than the "usual".

## 2022-03-04 ENCOUNTER — Other Ambulatory Visit: Payer: Self-pay | Admitting: Family Medicine

## 2022-03-05 ENCOUNTER — Ambulatory Visit (INDEPENDENT_AMBULATORY_CARE_PROVIDER_SITE_OTHER): Payer: Medicare Other | Admitting: Family Medicine

## 2022-03-05 ENCOUNTER — Encounter: Payer: Self-pay | Admitting: Family Medicine

## 2022-03-05 VITALS — BP 122/70 | HR 70 | Temp 97.9°F | Ht 68.0 in | Wt 155.7 lb

## 2022-03-05 DIAGNOSIS — R5383 Other fatigue: Secondary | ICD-10-CM | POA: Diagnosis not present

## 2022-03-05 DIAGNOSIS — K921 Melena: Secondary | ICD-10-CM

## 2022-03-05 NOTE — Progress Notes (Signed)
Established Patient Office Visit  Subjective   [name], female    DOB: 06-Feb-1946  Age: 77 y.o. MRN: [id number]  Chief Complaint  Patient presents with   Hospitalization Follow-up    HPI   Mia Peterson is seen for hospital follow-up.  She recalls on Christmas eve she developed cold-like symptoms with sore throat and fatigue.  She then developed some subsequent diarrhea symptoms several days later.  She went to urgent care initially and COVID testing and flu testing were negative.  She was referred to ER for further evaluation after developing some bloody diarrhea.  She has history of diverticulosis by CAT scan in the ER but no evidence for diverticulitis. Her labs were reviewed and fairly unremarkable.  No anemia.  Lipase normal.  CMP essentially normal.  She initially had some poor appetite but has improved some since then.  Her diarrhea has resolved.  No further bloody stools.  She is still battling with some fatigue but overall gradually improving.  She is trying to gradually increase her activity levels.  Last colonoscopy 2018.  She has been recently seen by neurology and is felt to have possibly have MS-.  She has been followed by neurology for several years and does have some demyelinating changes which are very nonspecific on recent MRI scan.  Her neurologic symptoms deal with imbalance have been relatively stable over several years.  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 Her smoking use included cigarettes. She started smoking about 52 years ago. She has a 20.00 pack-year smoking history. She has never used smokeless tobacco. She reports current alcohol use of about 7.0 standard drinks of alcohol per week. She reports that she does not use drugs. family history includes Cancer (age of onset: 51) in her father; Heart disease (age of onset: 31) in her mother. Allergies  Allergen Reactions   Other Rash    Derald Macleod cooling pads, severe itching   Adhesive [Tape]  Hives    Use paper tape only    Review of Systems  Constitutional:  Positive for malaise/fatigue. Negative for chills, fever and weight loss.  Respiratory:  Negative for cough and shortness of breath.   Cardiovascular:  Negative for chest pain and orthopnea.  Gastrointestinal:  Negative for abdominal pain, blood in stool, constipation, diarrhea, heartburn, melena, nausea and vomiting.  Genitourinary:  Negative for dysuria, flank pain and hematuria.  Neurological:  Negative for headaches.  Psychiatric/Behavioral:  Negative for depression.       Objective:     BP 122/70 (BP Location: Left Arm, Patient Position: Sitting, Cuff Size: Normal)   Pulse 70    Temp 97.9 F (36.6 C) (Oral)   Ht '5\' 8"'$  (1.727 m)   Wt 155 lb 11.2 oz (70.6 kg)   SpO2 99%   BMI 23.67 kg/m  BP Readings from Last 3 Encounters:  03/05/22 122/70  02/26/22 138/71  02/26/22 (!) 141/82   Wt Readings from Last 3 Encounters:  03/05/22 155 lb 11.2 oz (70.6 kg)  02/26/22 150 lb (68 kg)  01/15/22 154 lb 3.2 oz (69.9 kg)      Physical Exam Vitals reviewed.  Constitutional:      Appearance: Normal appearance.  Cardiovascular:     Rate and Rhythm: Normal rate and regular rhythm.  Pulmonary:     Effort: Pulmonary effort is normal.     Breath sounds: Normal breath sounds.  Abdominal:     General: There is no distension.     Palpations: Abdomen is soft.     Tenderness: There is no abdominal tenderness. There is no guarding or rebound.  Musculoskeletal:     Cervical back: Neck supple.  Neurological:     Mental Status: She is alert.      No results found for any visits on 03/05/22.  Last CBC Lab Results  Component Value Date   WBC 8.4 02/26/2022   HGB 12.9 02/26/2022   HCT 38.4 02/26/2022   MCV 88.3 02/26/2022   MCH 29.7 02/26/2022   RDW 14.0 02/26/2022   PLT 324 51/03/5850   Last metabolic panel Lab Results  Component Value Date   GLUCOSE 127 (H) 02/26/2022   NA 139 02/26/2022   K 3.7 02/26/2022   CL 106 02/26/2022   CO2 24 02/26/2022   BUN 15 02/26/2022   CREATININE 0.69 02/26/2022   GFRNONAA >60 02/26/2022   CALCIUM 8.6 (L) 02/26/2022   PROT 7.1 02/26/2022   ALBUMIN 3.3 (L) 02/26/2022   BILITOT 0.2 (L) 02/26/2022   ALKPHOS 73 02/26/2022   AST 36 02/26/2022   ALT 25 02/26/2022   ANIONGAP 9 02/26/2022      The 10-year ASCVD risk score (Arnett DK, et al., 2019) is: 16.2%    Assessment & Plan:   #1 history of recent bloody stools.  This was in the setting of diarrhea.  Did not suggest likely diverticulosis bleed.  She states she had somewhat similar symptoms previously with ischemic colitis.  She did have some associated abdominal pain  but symptoms have basically resolved at this point except for some basic fatigue.  Recent hemoglobin was stable.  Follow up for any recurrent diarrhea or hematochezia.    #2 fatigue following recent viral illness with associated diarrhea.  Recent lab work was relatively reassuring.  Recommend she gradually increase her activity levels  Carolann Littler, MD

## 2022-03-05 NOTE — Patient Instructions (Signed)
Follow up for any recurrent bloody stools  Gradually increase activities as tolerated.

## 2022-03-06 ENCOUNTER — Encounter: Payer: Self-pay | Admitting: *Deleted

## 2022-04-02 LAB — HM MAMMOGRAPHY

## 2022-05-14 ENCOUNTER — Telehealth: Payer: Self-pay | Admitting: Family Medicine

## 2022-05-14 NOTE — Telephone Encounter (Signed)
Contacted Mia Peterson to schedule their annual wellness visit. Appointment made for 05/29/22.  Mia Peterson AWV direct phone # [phone]

## 2022-05-29 ENCOUNTER — Ambulatory Visit (INDEPENDENT_AMBULATORY_CARE_PROVIDER_SITE_OTHER): Payer: Medicare Other

## 2022-05-29 VITALS — Ht 67.5 in | Wt 155.0 lb

## 2022-05-29 DIAGNOSIS — Z0000 Encounter for general adult medical examination without abnormal findings: Secondary | ICD-10-CM

## 2022-05-29 DIAGNOSIS — Z1211 Encounter for screening for malignant neoplasm of colon: Secondary | ICD-10-CM | POA: Diagnosis not present

## 2022-05-29 NOTE — Patient Instructions (Addendum)
[name] , Thank you for taking time to come for your Medicare Wellness Visit. I appreciate your ongoing commitment to your health goals. Please review the following plan we discussed and let me know if I can assist you in the future.   These are the goals we discussed:  Goals       Patient stated (pt-stated)      Continue to maintain your health through diet and exercise  Work with church.      Patient Stated      I am trying to work on my balance through yoga      Patient Stated      05/22/2021, wants to lose weight, wants to return to exercise classes      Weight (lb) < 150 lb (68 kg)      Give up wine (one glass)         This is a list of the screening recommended for you and due dates:  Health Maintenance  Topic Date Due   COVID-19 Vaccine (5 - 2023-24 season) 06/14/2022*   Zoster (Shingles) Vaccine (1 of 2) 08/28/2022*   Colon Cancer Screening  05/29/2023*   Flu Shot  09/26/2022   Medicare Annual Wellness Visit  05/29/2023   DTaP/Tdap/Td vaccine (4 - Td or Tdap) 07/16/2028   Pneumonia Vaccine  Completed   DEXA scan (bone density measurement)  Completed   Hepatitis C Screening: USPSTF Recommendation to screen - Ages 74-79 yo.  Completed   HPV Vaccine  Aged Out  *Topic was postponed. The date shown is not the original due date.    Advanced directives: Please bring a copy of your health care power of attorney and living will to the office to be added to your chart at your convenience.   Conditions/risks identified: None  Next appointment: Follow up in one year for your annual wellness visit    Preventive Care 65 Years and Older, Female Preventive care refers to lifestyle choices and visits with your health care provider that can promote health and wellness. What does preventive care include? A yearly physical exam. This is also called an annual well check. Dental exams once or twice a year. Routine eye exams. Ask your health care provider how often you should  have your eyes checked. Personal lifestyle choices, including: Daily care of your teeth and gums. Regular physical activity. Eating a healthy diet. Avoiding tobacco and drug use. Limiting alcohol use. Practicing safe sex. Taking low-dose aspirin every day. Taking vitamin and mineral supplements as recommended by your health care provider. What happens during an annual well check? The services and screenings done by your health care provider during your annual well check will depend on your age, overall health, lifestyle risk factors, and family history of disease. Counseling  Your health care provider may ask you questions about your: Alcohol use. Tobacco use. Drug use. Emotional well-being. Home and relationship well-being. Sexual activity. Eating habits. History of falls. Memory and ability to understand (cognition). Work and work Statistician. Reproductive health. Screening  You may have the following tests or measurements: Height, weight, and BMI. Blood pressure. Lipid and cholesterol levels. These may be checked every 5 years, or more frequently if you are over 39 years old. Skin check. Lung cancer screening. You may have this screening every year starting at age 8 if you have a 30-pack-year history of smoking and currently smoke or have quit within the past 15 years. Fecal occult blood test (FOBT) of the stool. You  may have this test every year starting at age 71. Flexible sigmoidoscopy or colonoscopy. You may have a sigmoidoscopy every 5 years or a colonoscopy every 10 years starting at age 23. Hepatitis C blood test. Hepatitis B blood test. Sexually transmitted disease (STD) testing. Diabetes screening. This is done by checking your blood sugar (glucose) after you have not eaten for a while (fasting). You may have this done every 1-3 years. Bone density scan. This is done to screen for osteoporosis. You may have this done starting at age 53. Mammogram. This may be done  every 1-2 years. Talk to your health care provider about how often you should have regular mammograms. Talk with your health care provider about your test results, treatment options, and if necessary, the need for more tests. Vaccines  Your health care provider may recommend certain vaccines, such as: Influenza vaccine. This is recommended every year. Tetanus, diphtheria, and acellular pertussis (Tdap, Td) vaccine. You may need a Td booster every 10 years. Zoster vaccine. You may need this after age 31. Pneumococcal 13-valent conjugate (PCV13) vaccine. One dose is recommended after age 27. Pneumococcal polysaccharide (PPSV23) vaccine. One dose is recommended after age 98. Talk to your health care provider about which screenings and vaccines you need and how often you need them. This information is not intended to replace advice given to you by your health care provider. Make sure you discuss any questions you have with your health care provider. Document Released: 03/10/2015 Document Revised: 11/01/2015 Document Reviewed: 12/13/2014 Elsevier Interactive Patient Education  2017 [place] Prevention in the Home Falls can cause injuries. They can happen to people of all ages. There are many things you can do to make your home safe and to help prevent falls. What can I do on the outside of my home? Regularly fix the edges of walkways and driveways and fix any cracks. Remove anything that might make you trip as you walk through a door, such as a raised step or threshold. Trim any bushes or trees on the path to your home. Use bright outdoor lighting. Clear any walking paths of anything that might make someone trip, such as rocks or tools. Regularly check to see if handrails are loose or broken. Make sure that both sides of any steps have handrails. Any raised decks and porches should have guardrails on the edges. Have any leaves, snow, or ice cleared regularly. Use sand or salt on  walking paths during winter. Clean up any spills in your garage right away. This includes oil or grease spills. What can I do in the bathroom? Use night lights. Install grab bars by the toilet and in the tub and shower. Do not use towel bars as grab bars. Use non-skid mats or decals in the tub or shower. If you need to sit down in the shower, use a plastic, non-slip stool. Keep the floor dry. Clean up any water that spills on the floor as soon as it happens. Remove soap buildup in the tub or shower regularly. Attach bath mats securely with double-sided non-slip rug tape. Do not have throw rugs and other things on the floor that can make you trip. What can I do in the bedroom? Use night lights. Make sure that you have a light by your bed that is easy to reach. Do not use any sheets or blankets that are too big for your bed. They should not hang down onto the floor. Have a firm chair that has side  arms. You can use this for support while you get dressed. Do not have throw rugs and other things on the floor that can make you trip. What can I do in the kitchen? Clean up any spills right away. Avoid walking on wet floors. Keep items that you use a lot in easy-to-reach places. If you need to reach something above you, use a strong step stool that has a grab bar. Keep electrical cords out of the way. Do not use floor polish or wax that makes floors slippery. If you must use wax, use non-skid floor wax. Do not have throw rugs and other things on the floor that can make you trip. What can I do with my stairs? Do not leave any items on the stairs. Make sure that there are handrails on both sides of the stairs and use them. Fix handrails that are broken or loose. Make sure that handrails are as long as the stairways. Check any carpeting to make sure that it is firmly attached to the stairs. Fix any carpet that is loose or worn. Avoid having throw rugs at the top or bottom of the stairs. If you do  have throw rugs, attach them to the floor with carpet tape. Make sure that you have a light switch at the top of the stairs and the bottom of the stairs. If you do not have them, ask someone to add them for you. What else can I do to help prevent falls? Wear shoes that: Do not have high heels. Have rubber bottoms. Are comfortable and fit you well. Are closed at the toe. Do not wear sandals. If you use a stepladder: Make sure that it is fully opened. Do not climb a closed stepladder. Make sure that both sides of the stepladder are locked into place. Ask someone to hold it for you, if possible. Clearly mark and make sure that you can see: Any grab bars or handrails. First and last steps. Where the edge of each step is. Use tools that help you move around (mobility aids) if they are needed. These include: Canes. Walkers. Scooters. Crutches. Turn on the lights when you go into a dark area. Replace any light bulbs as soon as they burn out. Set up your furniture so you have a clear path. Avoid moving your furniture around. If any of your floors are uneven, fix them. If there are any pets around you, be aware of where they are. Review your medicines with your doctor. Some medicines can make you feel dizzy. This can increase your chance of falling. Ask your doctor what other things that you can do to help prevent falls. This information is not intended to replace advice given to you by your health care provider. Make sure you discuss any questions you have with your health care provider. Document Released: 12/08/2008 Document Revised: 07/20/2015 Document Reviewed: 03/18/2014 Elsevier Interactive Patient Education  2017 [employer].

## 2022-05-29 NOTE — Progress Notes (Signed)
Subjective:   Mia Peterson is a 77 y.o. female who presents for Medicare Annual (Subsequent) preventive examination.  Review of Systems    Virtual Visit via Telephone Note  I connected with  Mia Peterson on 05/29/22 at 12:30 PM EDT by telephone and verified that I am speaking with the correct person using two identifiers.  Location: Patient: Home Provider: Office Persons participating in the virtual visit: patient/Nurse Health Advisor   I discussed the limitations, risks, security and privacy concerns of performing an evaluation and management service by telephone and the availability of in person appointments. The patient expressed understanding and agreed to proceed.  Interactive audio and video telecommunications were attempted between this nurse and patient, however failed, due to patient having technical difficulties OR patient did not have access to video capability.  We continued and completed visit with audio only.  Some vital signs may be absent or patient reported.   Criselda Peaches, LPN  Cardiac Risk Factors include: advanced age (>75men, >50 women)     Objective:    Today's Vitals   05/29/22 1245  Weight: 155 lb (70.3 kg)  Height: 5' 7.5" (1.715 m)   Body mass index is 23.92 kg/m.     05/29/2022   12:57 PM 02/26/2022    1:20 PM 05/22/2021   10:06 AM 05/15/2020   11:32 AM 05/27/2017    2:48 PM 02/01/2017    8:23 PM 04/03/2016    1:41 PM  Advanced Directives  Does Patient Have a Medical Advance Directive? Yes No Yes Yes Yes Yes Yes  Type of Paramedic of [place];Living will  [place];Living will [place];Living will  [place];Living will   Does patient want to make changes to medical advance directive?    No - Patient declined  No - Patient declined   Copy of [place] in Chart? No - copy requested  No - copy requested No - copy requested  No - copy requested      Current Medications (verified) Outpatient Encounter Medications as of 05/29/2022  Medication Sig   aspirin EC 81 MG tablet Take 81 mg by mouth daily.   atorvastatin (LIPITOR) 40 MG tablet TAKE 1 TABLET DAILY   calcium-vitamin D (OSCAL WITH D) 500-200 MG-UNIT TABS tablet Take by mouth.   cycloSPORINE (RESTASIS) 0.05 % ophthalmic emulsion Place 1 drop into both eyes 2 (two) times daily.   Flaxseed, Linseed, (FLAX SEED OIL) 1000 MG CAPS Take 1,000 mg by mouth daily.   Ginger, Zingiber officinalis, (GINGER ROOT) 550 MG CAPS Take 550 mg by mouth daily.   MELOXICAM PO Take by mouth.   Multiple Vitamins-Minerals (ONE DAILY WOMENS 50+ PO) Take 1 tablet by mouth daily.   omeprazole (PRILOSEC) 20 MG capsule Take by mouth.   oxybutynin (DITROPAN-XL) 5 MG 24 hr tablet Take 5 mg by mouth at bedtime.   POTASSIUM PO Take 1 tablet by mouth daily.   TURMERIC PO Take 400 mg by mouth 2 (two) times daily.    [DISCONTINUED] loratadine (CLARITIN) 10 MG tablet Take 1 tablet (10 mg total) by mouth daily. One po daily x 5 days   No facility-administered encounter medications on file as of 05/29/2022.    Allergies (verified) Other and Adhesive [tape]   History: Past Medical History:  Diagnosis Date   Arthritis    right knee and foot- OSteo    Asthma    treated in 20's  and 30's   Cancer    skin- squamous basal   Cataract    Complication of anesthesia    very slow to awaken    GERD (gastroesophageal reflux disease)    prn Prilosec   Head injury, closed    10/26/15- years ago - 30ish   Shortness of breath dyspnea    with exertion   Vaso vagal episode    passed out 2 times, close to passing out 3 times   Vertigo    was treated with rehab March and April 2017, does exercises if she begins to have symptoms   Past Surgical History:  Procedure Laterality Date   ABDOMINAL HYSTERECTOMY  1993   menorrhagia.   BREAST ENHANCEMENT SURGERY  1978   CATARACT EXTRACTION, BILATERAL     COLONOSCOPY W/  POLYPECTOMY     COLONOSCOPY WITH PROPOFOL N/A 02/03/2017   Procedure: COLONOSCOPY WITH PROPOFOL;  Surgeon: Carol Ada, MD;  Location: [facility];  Service: Endoscopy;  Laterality: N/A;   KNEE ARTHROSCOPY W/ MENISCAL REPAIR Right    right knee replaceent      TOTAL KNEE ARTHROPLASTY Right 11/03/2015   Procedure: RIGHT TOTAL KNEE ARTHROPLASTY;  Surgeon: Dorna Leitz, MD;  Location: [facility];  Service: Orthopedics;  Laterality: Right;   TUBAL LIGATION  1972   VARICOSE VEIN SURGERY  1978   Family History  Problem Relation Age of Onset   Heart disease Mother 20   Cancer Father 34       pancreatic   Ataxia Neg Hx    Social History   Socioeconomic History   Marital status: Married    Spouse name: Not on file   Number of children: Not on file   Years of education: Not on file   Highest education level: Doctorate  Occupational History   Not on file  Tobacco Use   Smoking status: Former    Packs/day: 1.00    Years: 20.00    Additional pack years: 0.00    Total pack years: 20.00    Types: Cigarettes    Start date: 62    Quit date: 03/24/1990    Years since quitting: 32.2   Smokeless tobacco: Never  Vaping Use   Vaping Use: Never used  Substance and Sexual Activity   Alcohol use: Yes    Alcohol/week: 7.0 standard drinks of alcohol    Types: 7 Glasses of wine per week    Comment: 3 times week   Drug use: No   Sexual activity: Not on file  Other Topics Concern   Not on file  Social History Narrative   Not on file   Social Determinants of Health   Financial Resource Strain: Low Risk  (05/29/2022)   Overall Financial Resource Strain (CARDIA)    Difficulty of Paying Living Expenses: Not hard at all  Food Insecurity: No Food Insecurity (05/29/2022)   Hunger Vital Sign    Worried About Running Out of Food in the Last Year: Never true    Ran Out of Food in the Last Year: Never true  Transportation Needs: No Transportation Needs (05/29/2022)   PRAPARE - Radiographer, therapeutic (Medical): No    Lack of Transportation (Non-Medical): No  Physical Activity: Sufficiently Active (05/29/2022)   Exercise Vital Sign    Days of Exercise per Week: 4 days    Minutes of Exercise per Session: 60 min  Recent Concern: Physical Activity - Insufficiently Active (05/29/2022)   Exercise Vital Sign  Days of Exercise per Week: 4 days    Minutes of Exercise per Session: 30 min  Stress: No Stress Concern Present (05/29/2022)   [place]    Feeling of Stress : Not at all  Social Connections: [place] (05/29/2022)   Social Connection and Isolation Panel [NHANES]    Frequency of Communication with Friends and Family: More than three times a week    Frequency of Social Gatherings with Friends and Family: More than three times a week    Attends Religious Services: More than 4 times per year    Active Member of [employer] or Organizations: Yes    Attends Music therapist: More than 4 times per year    Marital Status: Married    Tobacco Counseling Counseling given: Not Answered   Clinical Intake:  Pre-visit preparation completed: Yes  Pain : No/denies pain     BMI - recorded: 23.92 Nutritional Risks: None Diabetes: No  How often do you need to have someone help you when you read instructions, pamphlets, or other written materials from your doctor or pharmacy?: 1 - Never  Diabetic?  No  Interpreter Needed?: No  Information entered by :: Rolene Arbour LPN   Activities of Daily Living    05/29/2022   12:53 PM 05/25/2022    9:45 PM  In your present state of health, do you have any difficulty performing the following activities:  Hearing? 1 0  Comment Wears hearing aids   Vision? 0 0  Difficulty concentrating or making decisions? 0 0  Walking or climbing stairs? 1 1  Comment Followed by Orthopedic   Dressing or bathing? 0 0  Doing errands, shopping? 0 0  Preparing Food  and eating ? N N  Using the Toilet? N N  In the past six months, have you accidently leaked urine? Tempie Donning  Comment Wears pads. Followed by Urologist   Do you have problems with loss of bowel control? Tempie Donning  Comment Wears pads. Followed by Gastrologist   Managing your Medications? N N  Managing your Finances? N N  Housekeeping or managing your Housekeeping? N N    Patient Care Team: Eulas Post, MD as PCP - General  Indicate any recent Medical Services you may have received from other than [facility] providers in the past year (date may be approximate).     Assessment:   This is a routine wellness examination for Daisee.  Hearing/Vision screen Hearing Screening - Comments:: Wears hearing aids Vision Screening - Comments:: Wears rx glasses - up to date with routine eye exams with    Dietary issues and exercise activities discussed: Exercise limited by: None identified   Goals Addressed               This Visit's Progress     Patient stated (pt-stated)        Continue to maintain your health through diet and exercise  Work with church.       Depression Screen    05/29/2022   12:50 PM 03/05/2022    2:18 PM 05/22/2021   10:08 AM 03/05/2021    2:50 PM 05/15/2020   11:36 AM 05/27/2017    2:51 PM 04/03/2016    1:45 PM  PHQ 2/9 Scores  PHQ - 2 Score 0 3 0 0 0 0 0  PHQ- 9 Score  9         Fall Risk    05/29/2022  12:57 PM 05/25/2022    9:45 PM 03/05/2022    2:22 PM 06/12/2021    9:02 AM 05/22/2021   10:08 AM  Fall Risk   Falls in the past year? 0 0 0 0 0  Number falls in past yr: 0 0 0    Injury with Fall? 0 0 0    Risk for fall due to : No Fall Risks  No Fall Risks  Medication side effect  Follow up Falls prevention discussed  Falls evaluation completed  Falls evaluation completed;Education provided;Falls prevention discussed    FALL RISK PREVENTION PERTAINING TO THE HOME:  Any stairs in or around the home? No If so, are there any without handrails? No  Home free of loose  throw rugs in walkways, pet beds, electrical cords, etc? Yes  Adequate lighting in your home to reduce risk of falls? Yes   ASSISTIVE DEVICES UTILIZED TO PREVENT FALLS:  Life alert? No  Use of a cane, walker or w/c? No  Grab bars in the bathroom? Yes  Shower chair or bench in shower? Yes  Elevated toilet seat or a handicapped toilet? Yes   TIMED UP AND GO:  Was the test performed? No . Audio Visit   Cognitive Function:        05/29/2022   12:58 PM 05/22/2021   10:11 AM 05/15/2020   11:40 AM  6CIT Screen  What Year? 0 points 0 points 0 points  What month? 0 points 0 points 0 points  What time? 0 points 0 points 0 points  Count back from 20 0 points 0 points 0 points  Months in reverse 0 points 0 points 0 points  Repeat phrase 0 points 0 points 0 points  Total Score 0 points 0 points 0 points    Immunizations Immunization History  Administered Date(s) Administered   Fluad Quad(high Dose 65+) 10/29/2018, 02/15/2020   Hepatitis A, Adult 08/15/2014, 01/16/2015   IPV 10/18/2005   Influenza Inj Mdck Quad Pf 11/19/2021   Influenza Split 11/23/2010   Influenza, High Dose Seasonal PF 01/19/2013, 01/03/2014, 01/16/2015, 12/21/2015, 12/10/2016, 12/18/2017   Influenza-Unspecified 11/05/2020   PFIZER(Purple Top)SARS-COV-2 Vaccination 03/29/2019, 04/26/2019, 12/21/2019   Pfizer Covid-19 Vaccine Bivalent Booster 63yrs & up 11/05/2020   Pneumococcal Conjugate-13 01/19/2013   Pneumococcal Polysaccharide-23 02/26/2007, 03/30/2015   Respiratory Syncytial Virus Vaccine,Recomb Aduvanted(Arexvy) 11/19/2021   Td 02/26/2004   Tdap 10/18/2005, 07/17/2018   Zoster, Live 11/23/2010    TDAP status: Up to date  Flu Vaccine status: Up to date  Pneumococcal vaccine status: Up to date  Covid-19 vaccine status: Completed vaccines  Qualifies for Shingles Vaccine? Yes   Zostavax completed No   Shingrix Completed?: No.    Education has been provided regarding the importance of this vaccine.  Patient has been advised to call insurance company to determine out of pocket expense if they have not yet received this vaccine. Advised may also receive vaccine at local pharmacy or Health Dept. Verbalized acceptance and understanding.  Screening Tests Health Maintenance  Topic Date Due   COVID-19 Vaccine (5 - 2023-24 season) 06/14/2022 (Originally 10/26/2021)   Zoster Vaccines- Shingrix (1 of 2) 08/28/2022 (Originally 04/09/1964)   COLONOSCOPY (Pts 45-2yrs Insurance coverage will need to be confirmed)  05/29/2023 (Originally 02/03/2022)   [place]  09/26/2022   Medicare Annual Wellness (AWV)  05/29/2023   DTaP/Tdap/Td (4 - Td or Tdap) 07/16/2028   Pneumonia Vaccine 12+ Years old  Completed   DEXA SCAN  Completed   Hepatitis C  Screening  Completed   HPV VACCINES  Aged Out    Health Maintenance  There are no preventive care reminders to display for this patient.   Colorectal cancer screening: Referral to GI placed 05/29/22. Pt aware the office will call re: appt.  Mammogram status: No longer required due to Age.  Bone Density status: Completed 09/02/19. Results reflect: Bone density results: OSTEOPOROSIS. Repeat every   years.  Lung Cancer Screening: (Low Dose CT Chest recommended if Age 70-80 years, 30 pack-year currently smoking OR have quit w/in 15years.) does not qualify.     Additional Screening:  Hepatitis C Screening: does qualify; Completed 07/17/18  Vision Screening: Recommended annual ophthalmology exams for early detection of glaucoma and other disorders of the eye. Is the patient up to date with their annual eye exam?  Yes  Who is the provider or what is the name of the office in which the patient attends annual eye exams? Dr Celene Squibb If pt is not established with a provider, would they like to be referred to a provider to establish care? No .   Dental Screening: Recommended annual dental exams for proper oral hygiene  Community Resource Referral / Chronic Care  Management:  CRR required this visit?  No   CCM required this visit?  No      Plan:     I have personally reviewed and noted the following in the patient's chart:   Medical and social history Use of alcohol, tobacco or illicit drugs  Current medications and supplements including opioid prescriptions. Patient is not currently taking opioid prescriptions. Functional ability and status Nutritional status Physical activity Advanced directives List of other physicians Hospitalizations, surgeries, and ER visits in previous 12 months Vitals Screenings to include cognitive, depression, and falls Referrals and appointments  In addition, I have reviewed and discussed with patient certain preventive protocols, quality metrics, and best practice recommendations. A written personalized care plan for preventive services as well as general preventive health recommendations were provided to patient.     Criselda Peaches, LPN   [phone]   Nurse Notes:   None

## 2022-05-31 ENCOUNTER — Other Ambulatory Visit: Payer: Self-pay | Admitting: Family Medicine

## 2022-07-09 ENCOUNTER — Encounter: Payer: Self-pay | Admitting: Family Medicine

## 2022-07-09 ENCOUNTER — Ambulatory Visit (INDEPENDENT_AMBULATORY_CARE_PROVIDER_SITE_OTHER): Payer: Medicare Other | Admitting: Family Medicine

## 2022-07-09 VITALS — BP 128/70 | HR 54 | Temp 97.6°F | Wt 156.8 lb

## 2022-07-09 DIAGNOSIS — M810 Age-related osteoporosis without current pathological fracture: Secondary | ICD-10-CM

## 2022-07-09 DIAGNOSIS — E785 Hyperlipidemia, unspecified: Secondary | ICD-10-CM | POA: Diagnosis not present

## 2022-07-09 DIAGNOSIS — R635 Abnormal weight gain: Secondary | ICD-10-CM | POA: Diagnosis not present

## 2022-07-09 DIAGNOSIS — H6121 Impacted cerumen, right ear: Secondary | ICD-10-CM | POA: Diagnosis not present

## 2022-07-09 MED ORDER — ATORVASTATIN CALCIUM 40 MG PO TABS: 40.0000 mg | ORAL_TABLET | Freq: Every day | ORAL | 0 refills | Status: DC

## 2022-07-09 NOTE — Progress Notes (Signed)
   Subjective:    [name], female    DOB: Sep 23, 1945, 77 y.o.   MRN: [id number]  HPI Here for several issues. First she wants [place] to check her right ear. For the past week she feels a pain in the ear "like a pimple" and it cuases her vertigo to flare up. She has had wax cleaned out numerous times. Second she needs a refill of Lipitor. Third she ask [place] to set up another DEXA. Her last one was in July of 2021. Fourth she asks about how to keep from gaining weight. She says her diet has not changed, and she exercises almost every day. She walks and does deep water exercises, but she says her foot pain slows her down.    Review of Systems  Constitutional:  Positive for unexpected weight change.  HENT:  Positive for ear pain.   Respiratory: Negative.    Cardiovascular: Negative.   Neurological:  Positive for dizziness.       Objective:   Physical Exam Constitutional:      Appearance: Normal appearance. She is not ill-appearing.  HENT:     Right Ear: There is impacted cerumen.     Left Ear: Tympanic membrane, ear canal and external ear normal.  Cardiovascular:     Rate and Rhythm: Normal rate and regular rhythm.     Pulses: Normal pulses.     Heart sounds: Normal heart sounds.  Pulmonary:     Effort: Pulmonary effort is normal.     Breath sounds: Normal breath sounds.  Neurological:     General: No focal deficit present.     Mental Status: She is alert and oriented to person, place, and time.           Assessment & Plan:  We refilled the Lipitor for 90 days. She is due for a fasting lipid panel. We ordered another DEXA. For the weight gain, I suggested she go to her gym and use either the stationery bike or the elliptical machine, and she agreed. We attempted to irrigate the cerumen impaction in the right ear but were unsuccessful. We will refer her to ENT for this.  Gershon Crane, MD

## 2022-07-16 ENCOUNTER — Encounter: Payer: Self-pay | Admitting: Family Medicine

## 2022-07-16 NOTE — Telephone Encounter (Signed)
Bone density test has been sch for 07-31-2022 and pt is aware

## 2022-07-17 ENCOUNTER — Encounter: Payer: Self-pay | Admitting: Neurology

## 2022-07-17 ENCOUNTER — Telehealth: Payer: Self-pay | Admitting: Internal Medicine

## 2022-07-17 ENCOUNTER — Ambulatory Visit (INDEPENDENT_AMBULATORY_CARE_PROVIDER_SITE_OTHER): Payer: Medicare Other | Admitting: Neurology

## 2022-07-17 VITALS — BP 134/82 | HR 63 | Ht 67.5 in | Wt 158.6 lb

## 2022-07-17 DIAGNOSIS — G379 Demyelinating disease of central nervous system, unspecified: Secondary | ICD-10-CM | POA: Diagnosis not present

## 2022-07-17 DIAGNOSIS — R5383 Other fatigue: Secondary | ICD-10-CM | POA: Diagnosis not present

## 2022-07-17 NOTE — Telephone Encounter (Signed)
Spoke with patient. I have scheduled patient for f/u to discuss treatment options for OSA. nfn

## 2022-07-17 NOTE — Patient Instructions (Addendum)
Call dr young Mris in dec See you in jan 2025  Sleep Apnea Sleep apnea is a condition in which breathing pauses or becomes shallow during sleep. People with sleep apnea usually snore loudly. They may have times when they gasp and stop breathing for 10 seconds or more during sleep. This may happen many times during the night. Sleep apnea disrupts your sleep and keeps your body from getting the rest that it needs. This condition can increase your risk of certain health problems, including: Heart attack. Stroke. Obesity. Type 2 diabetes. Heart failure. Irregular heartbeat. High blood pressure. The goal of treatment is to help you breathe normally again. What are the causes?  The most common cause of sleep apnea is a collapsed or blocked airway. There are three kinds of sleep apnea: Obstructive sleep apnea. This kind is caused by a blocked or collapsed airway. Central sleep apnea. This kind happens when the part of the brain that controls breathing does not send the correct signals to the muscles that control breathing. Mixed sleep apnea. This is a combination of obstructive and central sleep apnea. What increases the risk? You are more likely to develop this condition if you: Are overweight. Smoke. Have a smaller than normal airway. Are older. Are female. Drink alcohol. Take sedatives or tranquilizers. Have a family history of sleep apnea. Have a tongue or tonsils that are larger than normal. What are the signs or symptoms? Symptoms of this condition include: Trouble staying asleep. Loud snoring. Morning headaches. Waking up gasping. Dry mouth or sore throat in the morning. Daytime sleepiness and tiredness. If you have daytime fatigue because of sleep apnea, you may be more likely to have: Trouble concentrating. Forgetfulness. Irritability or mood swings. Personality changes. Feelings of depression. Sexual dysfunction. This may include loss of interest if you are female, or  erectile dysfunction if you are female. How is this diagnosed? This condition may be diagnosed with: A medical history. A physical exam. A series of tests that are done while you are sleeping (sleep study). These tests are usually done in a sleep lab, but they may also be done at home. How is this treated? Treatment for this condition aims to restore normal breathing and to ease symptoms during sleep. It may involve managing health issues that can affect breathing, such as high blood pressure or obesity. Treatment may include: Sleeping on your side. Using a decongestant if you have nasal congestion. Avoiding the use of depressants, including alcohol, sedatives, and narcotics. Losing weight if you are overweight. Making changes to your diet. Quitting smoking. Using a device to open your airway while you sleep, such as: An oral appliance. This is a custom-made mouthpiece that shifts your lower jaw forward. A continuous positive airway pressure (CPAP) device. This device blows air through a mask when you breathe out (exhale). A nasal expiratory positive airway pressure (EPAP) device. This device has valves that you put into each nostril. A bi-level positive airway pressure (BIPAP) device. This device blows air through a mask when you breathe in (inhale) and breathe out (exhale). Having surgery if other treatments do not work. During surgery, excess tissue is removed to create a wider airway. Follow these instructions at home: Lifestyle Make any lifestyle changes that your health care provider recommends. Eat a healthy, well-balanced diet. Take steps to lose weight if you are overweight. Avoid using depressants, including alcohol, sedatives, and narcotics. Do not use any products that contain nicotine or tobacco. These products include cigarettes, chewing tobacco,  and vaping devices, such as e-cigarettes. If you need help quitting, ask your health care provider. General instructions Take  over-the-counter and prescription medicines only as told by your health care provider. If you were given a device to open your airway while you sleep, use it only as told by your health care provider. If you are having surgery, make sure to tell your health care provider you have sleep apnea. You may need to bring your device with you. Keep all follow-up visits. This is important. Contact a health care provider if: The device that you received to open your airway during sleep is uncomfortable or does not seem to be working. Your symptoms do not improve. Your symptoms get worse. Get help right away if: You develop: Chest pain. Shortness of breath. Discomfort in your back, arms, or stomach. You have: Trouble speaking. Weakness on one side of your body. Drooping in your face. These symptoms may represent a serious problem that is an emergency. Do not wait to see if the symptoms will go away. Get medical help right away. Call your local emergency services (911 in the [place] Do not drive yourself to the hospital. Summary Sleep apnea is a condition in which breathing pauses or becomes shallow during sleep. The most common cause is a collapsed or blocked airway. The goal of treatment is to restore normal breathing and to ease symptoms during sleep. This information is not intended to replace advice given to you by your health care provider. Make sure you discuss any questions you have with your health care provider. Document Revised: 09/20/2020 Document Reviewed: 01/21/2020 Elsevier Patient Education  2023 [employer].

## 2022-07-17 NOTE — Telephone Encounter (Signed)
Patient would like to schedule sleep test. Patient phone number is [phone].

## 2022-07-17 NOTE — Addendum Note (Signed)
Addended by: Naomie Dean B on: 07/17/2022 05:38 PM   Modules accepted: Level of Service

## 2022-07-17 NOTE — Progress Notes (Addendum)
Reason for visit: Dizziness, syncope  Referring physician: Dr. [name] is a 77 y.o. female  07/17/2022: 06/12/21- 35 yoF former smoker (20 pk yrs) followed for OSA, EDS, complicated by TIA, Asthma, GERD, Ischemic Colitis, Diverticulosis, Rosacea,  HST 03/27/21- AHI 14.1/ hr, desaturation to 81%, body weight 152 lbs Husband  Mia Peterson our pt, uses [facility] for CPAP. Body weight today-153 lbs Covid vax-4 Phizer Flu vax- had Recommend go and have this completed, decond test offered  Memory is fine. This past year, no new symptoms, no loss of vision, no weakness, numbness, tingling.   She has 2 lesions In the brain and one in the c-spine likely remote demyelination but stable, no symptoms, likely mild MS that has "burned out" and not active will continue to monitor and if no change in 3 years then we can stop checking.  MS not actove, in remission.  Patient complains of symptoms per HPI as well as the following symptoms: fatigue . Pertinent negatives and positives per HPI. All others negative   01/15/2022: Patient has no ne symptoms. 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 MRI of the brain and cervical spine showed 2 lesions that taken together may indicate a PMHx of MS but unlikely still active. We reviewed imaging for extended eriod of time, discussed differential and a lot about MS. Lesion int he brain stable from last imaging but never had an mri cervical spine no timeframe for lesion in the c-spine but need to follow since could be cause of her reported imabalnce  02/24/2022: MRI c-spine FINDINGS: :  On sagittal images, the spine is imaged from above the cervicomedullary junction to T2.   The cervicomedullary junction appears normal.  Paravertebral soft tissue appears normal.  The spinal cord is of normal caliber    there is a small T2 hyperintense focus posteriorly adjacent to C4.  This is  unchanged compared to the 11/10/2021 MRI.  The spinal cord is otherwise normal.  There is 2 mm anterolisthesis of T1 upon T2.  There is a hemangioma within the T4 vertebral body.  Otherwise, the vertebral bodies have normal signal.   The discs and interspaces were further evaluated on axial views from C2 to T1 as follows:   C2-C3: Minimal disc bulging and minimal left facet hypertrophy.  The level is otherwise normal and there is no spinal stenosis or nerve root compression.   C3-C4: Small left paramedian disc protrusion, left uncovertebral spurring, minimal facet hypertrophy and mild ligamenta flava hypertrophy causes mild to moderate left foraminal narrowing but no spinal stenosis or nerve root compression.   C4-C5: There is mild disc bulging, minimal facet hypertrophy and minimal ligamenta flava hypertrophy causing borderline spinal stenosis but no significant foraminal narrowing.  There is no nerve root compression.Marland Kitchen     C5-C6: There is disc bulging, uncovertebral spurring and reduced disc height causing mild spinal stenosis and mild left foraminal narrowing but no nerve root compression.     C6-C7:  There is a small left paramedian disc protrusion and mild uncovertebral spurring causing mild spinal stenosis and left greater than right  foraminal narrowing but no nerve root compression.   C7-T1: There is minimal disc bulging, reduced disc height and minimal uncovertebral spurring but no foraminal narrowing, spinal stenosis or nerve root compression.     Degenerative changes are essentially unchanged compared to the 11/10/2021 MRI.   MRI brain 01/2022  IMPRESSION: This MRI of the cervical spine without contrast shows the following: Small T2 hyperintense focus in the posterior spinal cord adjacent to C4 is unchanged compared to the 11/10/2021 MRI.  It is nonspecific and could represent a focus of chronic demyelination.  As there is only borderline spinal stenosis at C4-C5, it is unlikely to be  due to compressive myelopathy. Multilevel degenerative changes as detailed above causing borderline spinal stenosis at C4-C5 and mild spinal stenosis at C5-C6 and C6-C7.  There are various degrees of foraminal narrowing though there does not appear to be nerve root compression at any of the cervical levels. There do not appear to be any significant changes compared to the 11/11/2018 2020 MRI.  02-16-2022 MRI Brain IMPRESSION: This MRI of the brain without contrast shows the following: Generalized cortical atrophy that is most pronounced in the frontal, parietal and anterior temporal lobes.  This is unchanged compared to the 11/10/2021 MRI. Scattered stable T2/FLAIR hyperintense foci in the periventricular, subcortical and deep white matter.  Most of the foci are nonspecific and they could represent chronic ischemic and/or demyelinating foci.  1 focus in the left frontal lobe has an appearance most consistent with a small chronic lacunar infarction. No acute findings.  No change compared to the 11/10/2021 MRI.  MRI thoracic 01/2022:  IMPRESSION: This MRI of the thoracic spine without contrast shows the following: The spinal cord appears normal. Degenerative changes at T1-T2 and from T9-T10 through L1-L2 as detailed above.  There is no spinal stenosis or nerve root compression.      11/10/2021: personally reviewed images with neuroradiologist and also with patient agee with findings  MRI brain: IMPRESSION: This MRI of the brain with and without contrast shows the following: Mild to moderate generalized cortical atrophy, most pronounced in the frontal and parietal lobes. Scattered T2/FLAIR hyperintense foci in the hemispheres most consistent with chronic microvascular ischemic change.  Demyelination is much less likely to have this appearance. No acute findings.  Normal enhancement pattern.   MRI cervical spine: IMPRESSION: This MRI of the cervical spine without contrast shows the  following: Adjacent to C4, there is a small T2 hyperintense focus in the posterior spinal cord.  This is nonspecific but could represent a focus of remote demyelination or inflammation. Multilevel degenerative changes as detailed above, mostly mild, leading to borderline spinal stenosis at C4-C5 and mild spinal stenosis at C5-C6 and C6-C7.  There is no spinal cord compression.  No nerve root compression. Patient complains of symptoms per HPI as well as the following symptoms: imbalance . Pertinent negatives and positives per HPI. All others negative     Follow up 10/10/2021 AA: This is a patient of Dr. Clarisa Kindred who is transitioning to me, last seen in February 2021, Dr. Anne Hahn retired.  She is here today for perceived imbalance.  She was originally seen for dizziness but has had vertigo once recently but otherwise that is a rare episode, she was throwing up, she called an ambulance and went to [facility] for 5-6 hours and had a work-up negative CT.  But in the lasy year she has had more balance problems, more wobbly, she was taking yoga fairly regularly and trying to hard to balance, this is new bc in the past she was able to do it. Even on a bicycle she would fall, she would "veer", feels she "veers", she is enrolled in a PT program through [facility] and she does the exercise and have been treating her for balance for  the last 3-4 months she has been trying to balance on one leg and she is diligent on practicing and not getting and better. No neck pain but years ago she broke 2 vertebrae in the lower back. No low back pain, no neck pain, she does have should pain and stenosis in her neck. She goes to dr. Luiz Blare at [facility] orthopaedic. She has not had any recent MRI cervical. She has numbness in toes. No4 other focal neurologic deficits, associated symptoms, inciting events or modifiable factors.   I reviewed outside labs, images and notes:  IMPRESSION: This MRI of the brain with and without contrast shows  the following: Mild to moderate generalized cortical atrophy, most pronounced in the frontal and parietal lobes. Scattered T2/FLAIR hyperintense foci in the hemispheres most consistent with chronic microvascular ischemic change.  Demyelination is much less likely to have this appearance. No acute findings.  Normal enhancement pattern.    IMPRESSION: This MRI of the cervical spine without contrast shows the following: Adjacent to C4, there is a small T2 hyperintense focus in the posterior spinal cord.  This is nonspecific but could represent a focus of remote demyelination or inflammation. Multilevel degenerative changes as detailed above, mostly mild, leading to borderline spinal stenosis at C4-C5 and mild spinal stenosis at C5-C6 and C6-C7.  There is no spinal cord compression.  No nerve root compression.  02/26/2021: CT head INDICATION:    dizziness,   COMPARISON:   09/20/2016   TECHNIQUE:    Multiple axial images obtained from the skull base to the vertex without IV contrast  were obtained on  02/26/2021 7:45 PM   FINDINGS:   The ventricles and subarachnoid spaces are prominent consistent with involutional change.   There is decreased attenuation throughout the white matter most suggestive of small vessel ischemic change.    04/01/2019: Ms. Arcari is a 77 year old left-handed white female with a history of episodes of dizziness in the past.  The patient claims that she had onset of problems with dizziness in mid September 2020.  The patient woke up to go to the bathroom and felt somewhat staggery and off balance.  The episode lasted only a few moments and then dissipated.  Since that time, she was having daily episodes until December 2020 where she would have been dizziness that would be worse with standing, but may be persistent as well with sitting or even lying down.  The patient has documented on multiple occasions that her blood pressures are low during the symptomatic timeframes, she has  noted blood pressures as low as 70/50.  On one occasion, she had developed severe stomach cramps, she went to the bathroom and then blacked out.  She had several weeks of significant diarrhea, but this has resolved.  The patient is eating and drinking well, she is staying hydrated.  She denies palpitations of the heart or chest pain or shortness of breath.  She denies headaches or vision changes.  She was seen through Dr. Ezzard Standing from ENT, audiometry and VNG testing were unremarkable.  The patient did have an event in 2018 with transient aphasia, a full stroke work-up at that time was unremarkable at [facility]  2D echocardiogram, carotid Doppler study, and MRI of the brain did not show any significant abnormalities.  The patient does have some cortical atrophy and mild small vessel disease.  The patient reports no focal numbness of the face, arms, or legs.  She denies any weakness, she has had chronic issues with bladder control  in the past.  The patient has had 3 episodes lifetime of transient episodes of dizziness, the first was in the 1990s.  The patient had true vertigo about 4 years ago, but she thinks that this episode was quite different from the current episode.  She is having some problems with word finding and naming difficulty that has been present since 2000, but has worsened over the last 2 years.  She is sent to this office for an evaluation.  Past Medical History:  Diagnosis Date   Arthritis    right knee and foot- OSteo    Asthma    treated in 20's and 30's   Cancer (HCC)    skin- squamous basal   Cataract    Complication of anesthesia    very slow to awaken    GERD (gastroesophageal reflux disease)    prn Prilosec   Head injury, closed    10/26/15- years ago - 30ish   Shortness of breath dyspnea    with exertion   Vaso vagal episode    passed out 2 times, close to passing out 3 times   Vertigo    was treated with rehab March and April 2017, does exercises if she begins to have  symptoms    Past Surgical History:  Procedure Laterality Date   ABDOMINAL HYSTERECTOMY  1993   menorrhagia.   BREAST ENHANCEMENT SURGERY  1978   CATARACT EXTRACTION, BILATERAL     COLONOSCOPY W/ POLYPECTOMY     COLONOSCOPY WITH PROPOFOL N/A 02/03/2017   Procedure: COLONOSCOPY WITH PROPOFOL;  Surgeon: Jeani Hawking, MD;  Location: [facility];  Service: Endoscopy;  Laterality: N/A;   KNEE ARTHROSCOPY W/ MENISCAL REPAIR Right    right knee replaceent      TOTAL KNEE ARTHROPLASTY Right 11/03/2015   Procedure: RIGHT TOTAL KNEE ARTHROPLASTY;  Surgeon: Jodi Geralds, MD;  Location: [facility] OR;  Service: Orthopedics;  Laterality: Right;   TUBAL LIGATION  1972   VARICOSE VEIN SURGERY  1978    Family History  Problem Relation Age of Onset   Heart disease Mother 81   Cancer Father 74       pancreatic   Ataxia Neg Hx     Social history:  reports that she quit smoking about 32 years ago. Her smoking use included cigarettes. She started smoking about 52 years ago. She has a 20.00 pack-year smoking history. She has never used smokeless tobacco. She reports current alcohol use of about 7.0 standard drinks of alcohol per week. She reports that she does not use drugs.  Medications:  Prior to Admission medications   Medication Sig Start Date End Date Taking? Authorizing Provider  alendronate (FOSAMAX) 70 MG tablet TAKE 1 TABLET EVERY 7 DAYS. TAKE WITH A FULL GLASS OF WATER ON AN EMPTY STOMACH. 03/30/19  Yes Burchette, Elberta Fortis, MD  aspirin EC 81 MG tablet Take 81 mg by mouth daily.   Yes [provider]  atorvastatin (LIPITOR) 40 MG tablet Take 1 tablet (40 mg total) by mouth daily. 08/18/18  Yes Burchette, Elberta Fortis, MD  Azelaic Acid 15 % cream APPLY TO AFFECTED AREA EVERY DAY 06/15/18  Yes [provider]  Calcium Carb-Cholecalciferol (CALCIUM 600 + D PO) Take 2 tablets by mouth daily.   Yes [provider]  calcium-vitamin D (OSCAL WITH D) 500-200 MG-UNIT TABS tablet Take by mouth.    Yes [provider]  cycloSPORINE (RESTASIS) 0.05 % ophthalmic emulsion Place 1 drop into both eyes 2 (two) times  daily.    Yes [provider]  Flaxseed, Linseed, (FLAX SEED OIL) 1000 MG CAPS Take 1,000 mg by mouth daily.   Yes [provider]  Ginger, Zingiber officinalis, (GINGER ROOT) 550 MG CAPS Take 550 mg by mouth daily.   Yes [provider]  meloxicam (MOBIC) 15 MG tablet Take 15 mg by mouth daily. 04/07/18  Yes [provider]  Multiple Vitamins-Minerals (ONE DAILY WOMENS 50+ PO) Take 1 tablet by mouth daily.   Yes [provider]  MYRBETRIQ 25 MG TB24 tablet TAKE 1 TABLET DAILY 04/21/18  Yes Burchette, Elberta Fortis, MD  omeprazole (PRILOSEC) 20 MG capsule Take by mouth.   Yes [provider]  POTASSIUM PO Take 1 tablet by mouth daily.   Yes [provider]  TURMERIC PO Take 400 mg by mouth 2 (two) times daily.    Yes [provider]      Allergies  Allergen Reactions   Other Rash    Rubie Maid cooling pads, severe itching   Adhesive [Tape] Hives    Use paper tape only    ROS:  Patient complains of symptoms per HPI as well as the following symptoms: imbalance stable . Pertinent negatives and positives per HPI. All others negative  Blood pressure 134/82, pulse 63, height 5' 7.5" (1.715 m), weight 158 lb 9.6 oz (71.9 kg).   Physical exam: repeated, stable repeated again better at tandem balance is actually better Exam: Gen: NAD, conversant, well nourised,  well groomed                     CV: RRR, no MRG. No Carotid Bruits. No peripheral edema, warm, nontender Eyes: Conjunctivae clear without exudates or hemorrhage  Neuro: repeated, stable Detailed Neurologic Exam  Speech:    Speech is normal; fluent and spontaneous with normal comprehension.  Cognition:    The patient is oriented to person, place, and time;     recent and remote memory intact;     language fluent;     normal attention,  concentration,     fund of knowledge Cranial Nerves:    The pupils are equal, round, and reactive to light.  Attempted funduscopic exam could not visualize fundi due to small pupils.  Visual fields are full to finger confrontation. Extraocular movements are intact. Trigeminal sensation is intact and the muscles of mastication are normal. The face is symmetric. The palate elevates in the midline. Hearing intact. Voice is normal. Shoulder shrug is normal. The tongue has normal motion without fasciculations.   Coordination: nml   Gait: can heel and toe walk, tandem improved, not parkinsonian or spastic  Motor Observation:    No asymmetry, no atrophy, and no involuntary movements noted. Tone:    Normal muscle tone.    Posture:    Posture is normal. normal erect    Strength: very mild prox weakness appears to be due to pain. strength is V/V in the upper and lower limbs.      Sensation: intact to LT, pin prick, vibration, proprioception in the distal toes, Neg romberg     Reflex Exam:  DTR's:  Absent to Trace reflexes in AJs, no clonus, 1+ patellars and uppers. Hoffman's negative Toes:    The toes are equivocal  bilaterally.   Clonus:    Clonus is absent.    Assessment/Plan: This is a 77 year old lovely patient whom we have seen in the past, she saw Dr. Anne Hahn in February 2021 for dizziness,  she came in to [facility]  in with imbalance and reported symptoms of ataxia, she does have mild prox weakness on examination which may or may not be chronic is hard to tell, improved tandem and she is imbalanced on gait despite continued physical therapy and balance exercises.  No more numbness in the toes, however her sensory exam is completely normal with negative Romberg I do not think peripheral neuropathy.   Patient has no new symptoms since last exam. 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 Neuro exam unchanged.   A few old MS  plaques likely remission following with serial MRIS.   She has 2 lesions In the brain and one in the c-spine likely remote demyelination but stable, no new symptoms, likely mild MS that has "burned out" and not active will continue to monitor and if no change in 3 years then we can stop checking. We reviewed all images past and recent together and reviewed the lesions, stable.  Fatigue: Make f/u Dr. Maple Hudson. Has mild OSA with noctunral hypoxemia. HST 03/27/21- AHI 14.1/ hr, desaturation to 81%, body weight 152 lbs Husband  Mia Peterson our pt, uses [facility] for CPAP. Body weight today-153 lbs Covid vax-4 Phizer Flu vax- had Recommend go and have this completed, decond test offered Check thyroid  Memory is fine. This past year, no new symptoms, no loss of vision, no weakness, numbness, tingling.   Repeat MRI brain and cervical spine WITH contrast end of year - I will put a reminder. Give Xanax at that time, see bacj in Jan 2025  Orders Placed This Encounter  Procedures   TSH Rfx on Abnormal to Free T4      Naomie Dean, MD  [facility] [address] [place], [place] [address]  Phone [phone] Fax [phone] I spent  over 30 minutes of face-to-face and non-face-to-face time with patient on the  1. Demyelinating disease of central nervous system (HCC)   2. Other fatigue     diagnosis.  This included previsit chart review, lab review, study review, order entry, electronic health record documentation, patient education on the different diagnostic and therapeutic options, counseling and coordination of care, risks and benefits of management, compliance, or risk factor reduction

## 2022-07-18 LAB — TSH RFX ON ABNORMAL TO FREE T4: TSH: 2.13 u[IU]/mL (ref 0.450–4.500)

## 2022-07-31 ENCOUNTER — Ambulatory Visit (INDEPENDENT_AMBULATORY_CARE_PROVIDER_SITE_OTHER)
Admission: RE | Admit: 2022-07-31 | Discharge: 2022-07-31 | Disposition: A | Discharge status: Home / Self Care | Payer: Medicare Other | Source: Ambulatory Visit

## 2022-07-31 DIAGNOSIS — M810 Age-related osteoporosis without current pathological fracture: Secondary | ICD-10-CM | POA: Diagnosis not present

## 2022-08-02 NOTE — Telephone Encounter (Signed)
FYI

## 2022-08-04 NOTE — Telephone Encounter (Signed)
Noted.  Mia Peterson W Andros Channing MD [facility]  

## 2022-08-20 ENCOUNTER — Other Ambulatory Visit: Payer: Self-pay | Admitting: Family Medicine

## 2022-08-20 NOTE — Telephone Encounter (Signed)
Prescription Request  08/20/2022  LOV: 03/05/2022  What is the name of the medication or equipment? atorvastatin (LIPITOR) 40 MG tablet  *said she wants to be proactive and order now  Have you contacted your pharmacy to request a refill? Yes   Which pharmacy would you like this sent to?   [facility] - [place], [place] - [address] [place] [address] Phone: [phone] Fax: [phone]    Patient notified that their request is being sent to the clinical staff for review and that they should receive a response within 2 business days.   Please advise at Mobile [phone] (mobile)

## 2022-08-21 NOTE — Addendum Note (Signed)
Addended by: Christy Sartorius on: 08/21/2022 07:11 AM   Modules accepted: Orders

## 2022-08-22 MED ORDER — ATORVASTATIN CALCIUM 40 MG PO TABS: 40.0000 mg | ORAL_TABLET | Freq: Every day | ORAL | 0 refills | Status: DC

## 2022-08-26 ENCOUNTER — Ambulatory Visit: Payer: Medicare Other | Admitting: Internal Medicine

## 2022-08-28 NOTE — Telephone Encounter (Signed)
Pt will be here on 09/09/22 @ 9:20 am for labs - Pt will come in fasting.

## 2022-08-28 NOTE — Telephone Encounter (Signed)
Noted  

## 2022-09-09 ENCOUNTER — Other Ambulatory Visit: Payer: Medicare Other

## 2022-09-09 ENCOUNTER — Other Ambulatory Visit: Payer: Self-pay

## 2022-09-09 DIAGNOSIS — E785 Hyperlipidemia, unspecified: Secondary | ICD-10-CM

## 2022-09-09 LAB — LIPID PANEL
Cholesterol: 136 mg/dL (ref 0–200)
HDL: 67.9 mg/dL (ref >39.00)
LDL Cholesterol: 59 mg/dL (ref 0–99)
NonHDL: 68.54
Total CHOL/HDL Ratio: 2
Triglycerides: 48 mg/dL (ref 0.0–149.0)
VLDL: 9.6 mg/dL (ref 0.0–40.0)

## 2022-09-21 NOTE — Progress Notes (Signed)
HPI F former smoker (20 pk yrs) followed for OSA, EDS, complicated by TIA, Asthma, GERD, Ischemic Colitis, Diverticulosis, Rosacea,  HST 03/27/21- AHI 14.1/ hr, desaturation to 81%, body weight 152 lbs Husband  Scherrie Gerlach our pt, uses [facility] for CPAP.   ========================================================  06/12/21- 76 yoF former smoker (20 pk yrs) followed for OSA, EDS, complicated by TIA, Asthma, GERD, Ischemic Colitis, Diverticulosis, Rosacea, ? MS?,  HST 03/27/21- AHI 14.1/ hr, desaturation to 81%, body weight 152 lbs Husband  Scherrie Gerlach our pt, uses [facility] for CPAP. Body weight today-153 lbs Covid vax-4 Phizer Flu vax- had ----Patient was seen by Dr. Alveda Reasons office and stated that she didn't want to proceed because she has several questions and wants more information.  We had referred to Dr Toni Arthurs, DDS for OAP. Comes back to discuss her sleep study. Her primary sleep symptom is daytime fatigue.  She sees her husband's experience with CPAP and hopes for alternatives for herself.  Long discussion.  I discussed the significance of mild OSA.  She wants to wait on any treatment at this time and to come back in a year for reconsideration.  We did discuss conservative measures including keeping weight down, sleeping off flat of back.  09/23/22- 70 yoF former smoker (20 pk yrs) followed for OSA, EDS, complicated by TIA, Asthma, GERD, Ischemic Colitis, Diverticulosis, Rosacea, Body weight today-158 lbs Consider repeat sleep study Husband our pt Jonette Pesa, our patient. She has been using his spare machine and is ready to get her own.  ROS-see HPI   + = positive Constitutional:    weight loss, night sweats, fevers, chills, +fatigue, lassitude. HEENT:    headaches, difficulty swallowing, tooth/dental problems, sore throat,       +sneezing, +itching, ear ache, nasal congestion, post nasal drip, snoring CV:    chest pain, orthopnea, PND, swelling in lower extremities,  anasarca,                                   dizziness, palpitations Resp:   shortness of breath with exertion or at rest.                productive cough,   non-productive cough, coughing up of blood.              change in color of mucus.  wheezing.   Skin:    +rosacea GI:  +heartburn, indigestion, +abdominal pain, nausea, vomiting, diarrhea,                 change in bowel habits, loss of appetite GU: dysuria, change in color of urine, no urgency or frequency.   flank pain. MS:   +joint pain, stiffness, decreased range of motion, back pain. Neuro-     nothing unusual Psych:  change in mood or affect.  depression or anxiety.   memory loss.  OBJ- Physical Exam General- Alert, Oriented, Affect-appropriate, Distress- none acute, not obese Skin- rash-none, lesions- none, excoriation- none Lymphadenopathy- none Head- atraumatic            Eyes- Gross vision intact, PERRLA, conjunctivae and secretions clear            Ears- Hearing, canals-normal            Nose- Clear, no-Septal dev, mucus, polyps, erosion, perforation             Throat- Mallampati III, mucosa clear , drainage- none, tonsils-  atrophic, + teeth Neck- flexible , trachea midline, no stridor , thyroid nl, carotid no bruit Chest - symmetrical excursion , unlabored           Heart/CV- RRR , no murmur , no gallop  , no rub, nl s1 s2                           - JVD- none , edema- none, stasis changes- none, varices- none           Lung- clear to P&A, wheeze- none, cough- none , dullness-none, rub- none           Chest wall-  Abd-  Br/ Gen/ Rectal- Not done, not indicated Extrem- cyanosis- none, clubbing, none, atrophy- none, strength- nl Neuro- grossly intact to observation

## 2022-09-23 ENCOUNTER — Ambulatory Visit (INDEPENDENT_AMBULATORY_CARE_PROVIDER_SITE_OTHER): Payer: Medicare Other | Admitting: Internal Medicine

## 2022-09-23 ENCOUNTER — Encounter: Payer: Self-pay | Admitting: Internal Medicine

## 2022-09-23 VITALS — BP 104/64 | HR 66 | Ht 68.0 in | Wt 158.6 lb

## 2022-09-23 DIAGNOSIS — K219 Gastro-esophageal reflux disease without esophagitis: Secondary | ICD-10-CM | POA: Diagnosis not present

## 2022-09-23 DIAGNOSIS — G4733 Obstructive sleep apnea (adult) (pediatric): Secondary | ICD-10-CM | POA: Diagnosis not present

## 2022-09-23 NOTE — Patient Instructions (Signed)
Order- new DME, new CPAP auto 4-15, mask of choice, humidifier, supplies, AirView/ card  Please call if we can help

## 2022-10-14 ENCOUNTER — Encounter: Payer: Self-pay | Admitting: Internal Medicine

## 2022-10-14 NOTE — Assessment & Plan Note (Signed)
Discussed DME and mask choices. Plan- Start CPAP auto 4-15.

## 2022-10-14 NOTE — Assessment & Plan Note (Signed)
Emphasize reflux precautions 

## 2022-10-16 ENCOUNTER — Encounter: Payer: Self-pay | Admitting: Family Medicine

## 2022-10-16 ENCOUNTER — Ambulatory Visit (INDEPENDENT_AMBULATORY_CARE_PROVIDER_SITE_OTHER): Payer: Medicare Other | Admitting: Family Medicine

## 2022-10-16 VITALS — BP 144/70 | HR 62 | Temp 97.5°F | Ht 68.0 in | Wt 155.9 lb

## 2022-10-16 DIAGNOSIS — K219 Gastro-esophageal reflux disease without esophagitis: Secondary | ICD-10-CM

## 2022-10-16 MED ORDER — PANTOPRAZOLE SODIUM 40 MG PO TBEC: 40.0000 mg | DELAYED_RELEASE_TABLET | Freq: Every day | ORAL | 3 refills | Status: DC

## 2022-10-16 NOTE — Patient Instructions (Signed)
May supplement with OTC Pepcid 20 mg twice daily  If symptoms not significantly improved in 2 weeks let me know and we might need to look at GI referral.

## 2022-10-16 NOTE — Progress Notes (Unsigned)
Established Patient Office Visit  Subjective   [name], female    DOB: 30-Jan-1946  Age: 77 y.o. MRN: [id number]  Chief Complaint  Patient presents with   Gastroesophageal Reflux    HPI  {History (Optional):23778} [place] has history of obstructive sleep apnea, GERD, history of ischemic colitis, remote history of TIA, osteoarthritis involving multiple joints.  She is seen today with daily GERD symptoms with frequent substernal burning especially with exercise and at night.  She has elevated head of bed.  Currently on Prilosec 20 mg twice daily and sometimes takes an acid but still having breakthrough symptoms daily.  She has greatly scaled back her coffee to 1 cup daily.  No juice.  No mint products.  Has reduced wine consumption about 4 ounces 3 times per week.  No dysphagia.  Appetite and weight stable.  Good appetite.  No pain with swallowing. Does take meloxicam most days for arthritis  Past Medical History:  Diagnosis Date   Arthritis    right knee and foot- OSteo    Asthma    treated in 20's and 30's   Cancer (HCC)    skin- squamous basal   Cataract    Complication of anesthesia    very slow to awaken    GERD (gastroesophageal reflux disease)    prn Prilosec   Head injury, closed    10/26/15- years ago - 30ish   Shortness of breath dyspnea    with exertion   Vaso vagal episode    passed out 2 times, close to passing out 3 times   Vertigo    was treated with rehab March and April 2017, does exercises if she begins to have symptoms   Past Surgical History:  Procedure Laterality Date   ABDOMINAL HYSTERECTOMY  1993   menorrhagia.   BREAST ENHANCEMENT SURGERY  1978   CATARACT EXTRACTION, BILATERAL     COLONOSCOPY W/ POLYPECTOMY     COLONOSCOPY WITH PROPOFOL N/A 02/03/2017   Procedure: COLONOSCOPY WITH PROPOFOL;  Surgeon: Jeani Hawking, MD;  Location: [facility];  Service: Endoscopy;  Laterality: N/A;   KNEE ARTHROSCOPY W/ MENISCAL REPAIR Right     right knee replaceent      TOTAL KNEE ARTHROPLASTY Right 11/03/2015   Procedure: RIGHT TOTAL KNEE ARTHROPLASTY;  Surgeon: Jodi Geralds, MD;  Location: [facility] OR;  Service: Orthopedics;  Laterality: Right;   TUBAL LIGATION  1972   VARICOSE VEIN SURGERY  1978    reports that she quit smoking about 32 years ago. Her smoking use included cigarettes. She started smoking about 52 years ago. She has a 20.1 pack-year smoking history. She has never used smokeless tobacco. She reports current alcohol use of about 7.0 standard drinks of alcohol per week. She reports that she does not use drugs. family history includes Cancer (age of onset: 78) in her father; Heart disease (age of onset: 55) in her mother. Allergies  Allergen Reactions   Other Rash    Rubie Maid cooling pads, severe itching   Adhesive [Tape] Hives    Use paper tape only    Review of Systems  Constitutional:  Negative for weight loss.  Respiratory:  Negative for cough.   Cardiovascular:  Negative for chest pain.  Gastrointestinal:  Positive for heartburn. Negative for abdominal pain, blood in stool, melena and vomiting.      Objective:     BP (!) 146/74 (BP Location: Left Arm, Patient Position: Sitting, Cuff Size: Normal)   Pulse 62  Temp (!) 97.5 F (36.4 C) (Oral)   Ht 5\' 8"  (1.727 m)   Wt 155 lb 14.4 oz (70.7 kg)   SpO2 98%   BMI 23.70 kg/m  {Vitals History (Optional):23777}  Physical Exam Vitals reviewed.  Constitutional:      Appearance: Normal appearance.  Cardiovascular:     Rate and Rhythm: Normal rate and regular rhythm.  Pulmonary:     Effort: Pulmonary effort is normal.     Breath sounds: Normal breath sounds.  Abdominal:     General: There is no distension.     Palpations: Abdomen is soft. There is no mass.     Tenderness: There is no abdominal tenderness.  Neurological:     Mental Status: She is alert.      No results found for any visits on 10/16/22.  {Labs (Optional):23779}  The 10-year ASCVD  risk score (Arnett DK, et al., 2019) is: 25.1%    Assessment & Plan:   Problem List Items Addressed This Visit       Unprioritized   GERD - Primary   Relevant Medications   pantoprazole (PROTONIX) 40 MG tablet   Other Relevant Orders   Ambulatory referral to Gastroenterology  Patient relates several month history of almost daily GERD symptoms worse with exercise and at night.  She has tried dietary modification without much improvement.  Currently on omeprazole 20 mg twice daily with no improvement  -Leave off meloxicam as much as possible -Changed to Protonix 40 mg once daily and supplement with Pepcid 20 mg twice daily as needed -Set up GI referral given her persistent symptoms on twice daily PPI  No follow-ups on file.    Evelena Peat, MD

## 2022-11-07 ENCOUNTER — Emergency Department (HOSPITAL_COMMUNITY)
Admission: EM | Admit: 2022-11-07 | Discharge: 2022-11-07 | Disposition: A | Discharge status: Home / Self Care | Payer: Medicare Other | Attending: Emergency Medicine | Admitting: Emergency Medicine

## 2022-11-07 ENCOUNTER — Other Ambulatory Visit: Payer: Self-pay

## 2022-11-07 ENCOUNTER — Emergency Department (HOSPITAL_COMMUNITY): Payer: Medicare Other

## 2022-11-07 ENCOUNTER — Encounter (HOSPITAL_COMMUNITY): Payer: Self-pay

## 2022-11-07 DIAGNOSIS — Z7982 Long term (current) use of aspirin: Secondary | ICD-10-CM | POA: Diagnosis not present

## 2022-11-07 DIAGNOSIS — M546 Pain in thoracic spine: Secondary | ICD-10-CM | POA: Diagnosis not present

## 2022-11-07 DIAGNOSIS — Z85828 Personal history of other malignant neoplasm of skin: Secondary | ICD-10-CM | POA: Diagnosis not present

## 2022-11-07 DIAGNOSIS — E871 Hypo-osmolality and hyponatremia: Secondary | ICD-10-CM | POA: Insufficient documentation

## 2022-11-07 DIAGNOSIS — J45909 Unspecified asthma, uncomplicated: Secondary | ICD-10-CM | POA: Insufficient documentation

## 2022-11-07 DIAGNOSIS — R42 Dizziness and giddiness: Secondary | ICD-10-CM | POA: Diagnosis present

## 2022-11-07 LAB — COMPREHENSIVE METABOLIC PANEL
ALT: 27 U/L (ref 0–44)
AST: 28 U/L (ref 15–41)
Albumin: 3.1 g/dL — ABNORMAL LOW (ref 3.5–5.0)
Alkaline Phosphatase: 64 U/L (ref 38–126)
Anion gap: 7 (ref 5–15)
BUN: 17 mg/dL (ref 8–23)
CO2: 27 mmol/L (ref 22–32)
Calcium: 8.4 mg/dL — ABNORMAL LOW (ref 8.9–10.3)
Chloride: 97 mmol/L — ABNORMAL LOW (ref 98–111)
Creatinine, Ser: 0.65 mg/dL (ref 0.44–1.00)
GFR, Estimated: >60 mL/min (ref >60)
Glucose, Bld: 106 mg/dL — ABNORMAL HIGH (ref 70–99)
Potassium: 3.9 mmol/L (ref 3.5–5.1)
Sodium: 131 mmol/L — ABNORMAL LOW (ref 135–145)
Total Bilirubin: 0.2 mg/dL — ABNORMAL LOW (ref 0.3–1.2)
Total Protein: 5.7 g/dL — ABNORMAL LOW (ref 6.5–8.1)

## 2022-11-07 LAB — CBC WITH DIFFERENTIAL/PLATELET
Abs Immature Granulocytes: 0.06 10*3/uL (ref 0.00–0.07)
Basophils Absolute: 0 10*3/uL (ref 0.0–0.1)
Basophils Relative: 0 %
Eosinophils Absolute: 0.1 10*3/uL (ref 0.0–0.5)
Eosinophils Relative: 1 %
HCT: 39.4 % (ref 36.0–46.0)
Hemoglobin: 12.3 g/dL (ref 12.0–15.0)
Immature Granulocytes: 1 %
Lymphocytes Relative: 17 %
Lymphs Abs: 1.9 10*3/uL (ref 0.7–4.0)
MCH: 30.1 pg (ref 26.0–34.0)
MCHC: 31.2 g/dL (ref 30.0–36.0)
MCV: 96.6 fL (ref 80.0–100.0)
Monocytes Absolute: 1.2 10*3/uL — ABNORMAL HIGH (ref 0.1–1.0)
Monocytes Relative: 11 %
Neutro Abs: 7.6 10*3/uL (ref 1.7–7.7)
Neutrophils Relative %: 70 %
Platelets: 229 10*3/uL (ref 150–400)
RBC: 4.08 MIL/uL (ref 3.87–5.11)
RDW: 14.6 % (ref 11.5–15.5)
WBC: 10.8 10*3/uL — ABNORMAL HIGH (ref 4.0–10.5)
nRBC: 0 % (ref 0.0–0.2)

## 2022-11-07 LAB — URINALYSIS, ROUTINE W REFLEX MICROSCOPIC
Bilirubin Urine: NEGATIVE
Glucose, UA: NEGATIVE mg/dL
Hgb urine dipstick: NEGATIVE
Ketones, ur: NEGATIVE mg/dL
Leukocytes,Ua: NEGATIVE
Nitrite: NEGATIVE
Protein, ur: NEGATIVE mg/dL
Specific Gravity, Urine: 1.009 (ref 1.005–1.030)
pH: 7 (ref 5.0–8.0)

## 2022-11-07 LAB — TROPONIN I (HIGH SENSITIVITY)
Troponin I (High Sensitivity): 5 ng/L (ref <18)
Troponin I (High Sensitivity): 6 ng/L (ref <18)

## 2022-11-07 LAB — LIPASE, BLOOD: Lipase: 22 U/L (ref 11–51)

## 2022-11-07 MED ORDER — LACTATED RINGERS IV BOLUS
500.0000 mL | Freq: Once | INTRAVENOUS | Status: AC
  Administered 2022-11-07: 500 mL via INTRAVENOUS

## 2022-11-07 MED ORDER — IOHEXOL 350 MG/ML SOLN
100.0000 mL | Freq: Once | INTRAVENOUS | Status: AC | PRN
  Administered 2022-11-07: 100 mL via INTRAVENOUS

## 2022-11-07 NOTE — Discharge Instructions (Signed)
Thank you for coming to [facility] Emergency Department. You were seen for back pain and dizziness. We did an exam, labs, and imaging, and these showed no acute findings. It is possible you experienced a muscle spasm. You can take tylenol 1,000 mg every 8 hours as needed for pain. Please stay well-hydrated at home.  Please follow up with your primary care provider within 1-2 weeks.   Do not hesitate to return to the ED or call 911 if you experience: -Worsening symptoms -Lightheadedness, passing out -Fevers/chills -Anything else that concerns you

## 2022-11-07 NOTE — ED Notes (Signed)
Unsuccessful [place] IV attempt

## 2022-11-07 NOTE — ED Provider Notes (Signed)
[place] EM[facility]ENC[facility] DEPARTMENT AT [facility] Provider Note   CSN: [id number] Arrival date & time: 11/07/22  1451     History  Chief Complaint  Patient presents with   Dizziness   Back Pain    Mia Peterson is a 77 y.o. female with PMH as listed below who presents BIBA from home for dizziness, back pain. Pt reports going to the gym earlier, came home, then had a "flash" of severe thoracic back pain, dizziness, diaphoresis and nausea.  It occurred she was sitting down and resting.  It lasted a few minutes and then dissipated.  She went about her day and then later it occurred again, severe dizziness and lightheadedness, felt like she was going to pass out, the pain radiated around her back to her epigastric/substernal region underneath her breasts.  She has never had this happen before.  Has had no recent fevers/chills, illnesses, other chest pain, palpitations, shortness of breath, nausea vomiting diarrhea constipation, urinary symptoms, leg swelling, falls/trauma, focal weakness, numbness tingling, headache, visual changes.  3 weeks ago started taking Protonix for GERD.  She had an endoscopy yesterday where they found a small hiatal hernia.  She was n.p.o. prior to the procedure but for the rest of the day ate and drink normally and very well.  Currently she feels 2 out of 10 thoracic back pain radiating to her chest and feels dizzy/weak still.   Past Medical History:  Diagnosis Date   Arthritis    right knee and foot- OSteo    Asthma    treated in 20's and 30's   Cancer (HCC)    skin- squamous basal   Cataract    Complication of anesthesia    very slow to awaken    GERD (gastroesophageal reflux disease)    prn Prilosec   Head injury, closed    10/26/15- years ago - 30ish   Shortness of breath dyspnea    with exertion   Vaso vagal episode    passed out 2 times, close to passing out 3 times   Vertigo    was treated with rehab March and April 2017, does exercises if  she begins to have symptoms       Home Medications Prior to Admission medications   Medication Sig Start Date End Date Taking? Authorizing Provider  aspirin EC 81 MG tablet Take 81 mg by mouth daily.    [provider]  atorvastatin (LIPITOR) 40 MG tablet Take 1 tablet (40 mg total) by mouth daily. 08/22/22   Burchette, Elberta Fortis, MD  calcium-vitamin D (OSCAL WITH D) 500-200 MG-UNIT TABS tablet Take by mouth.    [provider]  cycloSPORINE (RESTASIS) 0.05 % ophthalmic emulsion Place 1 drop into both eyes 2 (two) times daily.    [provider]  Flaxseed, Linseed, (FLAX SEED OIL) 1000 MG CAPS Take 1,000 mg by mouth daily.    [provider]  Ginger, Zingiber officinalis, (GINGER ROOT) 550 MG CAPS Take 550 mg by mouth daily.    [provider]  MELOXICAM PO Take by mouth.    [provider]  Multiple Vitamins-Minerals (ONE DAILY WOMENS 50+ PO) Take 1 tablet by mouth daily.    [provider]  oxybutynin (DITROPAN-XL) 5 MG 24 hr tablet Take 5 mg by mouth at bedtime.    [provider]  pantoprazole (PROTONIX) 40 MG tablet Take 1 tablet (40 mg total) by mouth daily. 10/16/22   Burchette, Elberta Fortis, MD  POTASSIUM PO Take 1 tablet by mouth daily.    [provider]  TURMERIC PO Take 400 mg by mouth 2 (two) times daily.     [provider]      Allergies    Other and Adhesive [tape]    Review of Systems   Review of Systems A 10 point review of systems was performed and is negative unless otherwise reported in HPI.  Physical Exam Updated Vital Signs BP (!) 157/71   Pulse (!) 51   Temp 97.9 F (36.6 C) (Oral)   Resp 16   SpO2 100%  Physical Exam General: Normal appearing female, lying in bed.  HEENT: Sclera anicteric, MMM, trachea midline.  Cardiology: RRR, no murmurs/rubs/gallops. BL radial and DP pulses equal bilaterally.  Resp: Normal respiratory rate and effort. CTAB, no wheezes, rhonchi,  crackles.  Abd: Soft, non-tender, non-distended. No rebound tenderness or guarding.  GU: Deferred. MSK: No peripheral edema or signs of trauma.  Skin: warm, dry.  Back: No CVA tenderness. No gross abnormalities. No midline C T or L spine tenderness. No paraspinal muscular tightness/tenderness to thoracic or lumbar back.  Neuro: A&Ox4, CNs II-XII grossly intact. 5/5 strength all extremities. Sensation grossly intact.  Psych: Normal mood and affect.   ED Results / Procedures / Treatments   Labs (all labs ordered are listed, but only abnormal results are displayed) Labs Reviewed  CBC WITH DIFFERENTIAL/PLATELET - Abnormal; Notable for the following components:      Result Value   WBC 10.8 (*)    Monocytes Absolute 1.2 (*)    All other components within normal limits  COMPREHENSIVE METABOLIC PANEL - Abnormal; Notable for the following components:   Sodium 131 (*)    Chloride 97 (*)    Glucose, Bld 106 (*)    Calcium 8.4 (*)    Total Protein 5.7 (*)    Albumin 3.1 (*)    Total Bilirubin 0.2 (*)    All other components within normal limits  URINALYSIS, ROUTINE W REFLEX MICROSCOPIC  LIPASE, BLOOD  CBG MONITORING, ED  TROPONIN I (HIGH SENSITIVITY)  TROPONIN I (HIGH SENSITIVITY)    EKG EKG Interpretation Date/Time:  Thursday November 07 2022 15:10:49 EDT Ventricular Rate:  56 PR Interval:  178 QRS Duration:  99 QT Interval:  429 QTC Calculation: 414 R Axis:   76  Text Interpretation: Sinus rhythm Atrial premature complexes Low voltage, precordial leads Similar to prior EKG Confirmed by Vivi Barrack [id number]) on 11/07/2022 5:45:38 PM  Radiology CT Angio Chest/Abd/Pel for Dissection W and/or Wo Contrast  Result Date: 11/07/2022 CLINICAL DATA:  Acute aortic syndrome.  Dizziness, back pain, nausea EXAM: CT ANGIOGRAPHY CHEST, ABDOMEN AND PELVIS TECHNIQUE: Non-contrast CT of the chest was initially obtained. Multidetector CT imaging through the chest, abdomen and pelvis was performed  using the standard protocol during bolus administration of intravenous contrast. Multiplanar reconstructed images and MIPs were obtained and reviewed to evaluate the vascular anatomy. RADIATION DOSE REDUCTION: This exam was performed according to the departmental dose-optimization program which includes automated exposure control, adjustment of the mA and/or kV according to patient size and/or use of iterative reconstruction technique. CONTRAST:  OMNIPAQUE IOHEXOL 350 MG/ML SOLN COMPARISON:  CT abdomen pelvis 02/26/2022, CT chest 11/01/2015 FINDINGS: CTA CHEST FINDINGS Cardiovascular: The proximal descending thoracic aorta is dilated measuring 3.3 cm in diameter just distal to the aortic arch. The ascending aorta and distal descending thoracic aorta are of normal caliber. No intramural hematoma or dissection. Mild atherosclerotic calcification.  No significant coronary artery calcification. Global cardiac size within normal limits. No pericardial effusion. Central pulmonary arteries are of normal caliber. No intraluminal filling defect identified through the segmental level within the pulmonary arterial tree to suggest acute pulmonary embolism. Mediastinum/Nodes: Small hiatal hernia. Esophagus unremarkable. Thyroid unremarkable. No pathologic thoracic adenopathy. Lungs/Pleura: Mosaic attenuation of the pulmonary parenchyma relates to multifocal air trapping likely related to small airways disease. No confluent pulmonary infiltrate. No pneumothorax or pleural effusion. No central obstructing lesion. Musculoskeletal: No acute bone abnormality. No lytic or blastic bone lesion. Bilateral breast implants noted. Review of the MIP images confirms the above findings. CTA ABDOMEN AND PELVIS FINDINGS VASCULAR Aorta: Normal caliber aorta without aneurysm, dissection, vasculitis or significant stenosis. Celiac: Patent without evidence of aneurysm, dissection, vasculitis or significant stenosis. SMA: Patent without evidence  of aneurysm, dissection, vasculitis or significant stenosis. Renals: Both renal arteries are patent without evidence of aneurysm, dissection, vasculitis, fibromuscular dysplasia or significant stenosis. IMA: Patent without evidence of aneurysm, dissection, vasculitis or significant stenosis. Inflow: Patent without evidence of aneurysm, dissection, vasculitis or significant stenosis. Veins: No obvious venous abnormality within the limitations of this arterial phase study. Review of the MIP images confirms the above findings. NON-VASCULAR Hepatobiliary: No focal liver abnormality is seen. No gallstones, gallbladder wall thickening, or biliary dilatation. Pancreas: Unremarkable Spleen: Unremarkable Adrenals/Urinary Tract: Adrenal glands are unremarkable. Kidneys are normal, without renal calculi, focal lesion, or hydronephrosis. Bladder is unremarkable. Stomach/Bowel: Stomach is within normal limits. Appendix appears normal. No evidence of bowel wall thickening, distention, or inflammatory changes. Lymphatic: No pathologic adenopathy within the abdomen and pelvis Reproductive: Status post hysterectomy. No adnexal masses. Other: No abdominal wall hernia. Musculoskeletal: Osseous structures are age-appropriate. No acute bone abnormality. No lytic or blastic bone lesion. Review of the MIP images confirms the above findings. IMPRESSION: 1. No acute intrathoracic or intra-abdominal pathology identified. No etiology identified for the patient's reported symptoms. 2. 3.3 cm proximal descending thoracic aortic aneurysm. Recommend annual imaging followup by CTA or MRA. This recommendation follows 2010 ACCF/AHA/AATS/ACR/ASA/SCA/SCAI/SIR/STS/SVM Guidelines for the Diagnosis and Management of Patients with Thoracic Aortic Disease. Circulation.2010; 121: G295-M841. Aortic aneurysm NOS (ICD10-I71.9) 3. Mosaic attenuation of the pulmonary parenchyma relates to multifocal air trapping likely related to small airways disease. 4. Small  hiatal hernia. Aortic Atherosclerosis (ICD10-I70.0). Electronically Signed   By: Helyn Numbers M.D.   On: 11/07/2022 21:13    Procedures Procedures    Medications Ordered in ED Medications  lactated ringers bolus 500 mL (0 mLs Intravenous Stopped 11/07/22 1646)  iohexol (OMNIPAQUE) 350 MG/ML injection 100 mL (100 mLs Intravenous Contrast Given 11/07/22 1919)    ED Course/ Medical Decision Making/ A&P                          Medical Decision Making Amount and/or Complexity of Data Reviewed Labs: ordered. Decision-making details documented in ED Course. Radiology: ordered. Decision-making details documented in ED Course.  Risk Prescription drug management.    This patient presents to the ED for concern of back pain radiating to chest a/w severe dizziness, this involves an extensive number of treatment options, and is a complaint that carries with it a high risk of complications and morbidity.  I considered the following differential and admission for this acute, potentially life threatening condition.   MDM:    DDX for syncope includes but is not limited to:  Patient with back pain radiating to chest a/w dizziness, nausea, diaphoresis. Still has mild  2/10 pain and dizziness. Consider broad differential, including AAS, atypical ACS or arrhythmia, muscle spasm, pancreatitis, gastritis. Recently diagnosed w/ hiatal hernia w/ known GERD but states this didn't feel like GERD. Consider anemia and electrolyte abnormalities as possible etiologies. Considered PE as well, although without hypoxia or sob. No leg swelling to suggest acute HF. Consider more benign cause such as muscle spasm or muscle strain/sprain. She didn't have any trauma to indicate thoracic spine injury and has no midline TTP, f/c, or LE neurologic symptoms to indicate spinal cord injury. No reported hematochezia/melena and her Hgb is stable, no c/f anemia causing symptoms.  Clinical Course as of 11/08/22 2337  Thu Nov 07, 2022  1949 Troponin I (High Sensitivity): 5 neg [HN]  1950 Sodium(!): 131 Mild hyponatremia, otherwise unremarkable in context of presentation [HN]  2127 CT Angio Chest/Abd/Pel for Dissection W and/or Wo Contrast 1. No acute intrathoracic or intra-abdominal pathology identified. No etiology identified for the patient's reported symptoms. 2. 3.3 cm proximal descending thoracic aortic aneurysm. Recommend annual imaging followup by CTA or MRA. This recommendation follows 2010 ACCF/AHA/AATS/ACR/ASA/SCA/SCAI/SIR/STS/SVM Guidelines for the Diagnosis and Management of Patients with Thoracic Aortic Disease. Circulation.2010; 121: J478-G956. Aortic aneurysm NOS (ICD10-I71.9) 3. Mosaic attenuation of the pulmonary parenchyma relates to multifocal air trapping likely related to small airways disease. 4. Small hiatal hernia.   [HN]    Clinical Course User Index [HN] Loetta Rough, MD    Labs: I Ordered, and personally interpreted labs.  The pertinent results include:  those listed above  Imaging Studies ordered: I ordered imaging studies including CT dissection I independently visualized and interpreted imaging. I agree with the radiologist interpretation  Additional history obtained from chart review, husband at bedside.    Cardiac Monitoring: The patient was maintained on a cardiac monitor.  I personally viewed and interpreted the cardiac monitored which showed an underlying rhythm of: NSR  Reevaluation: After the interventions noted above, I reevaluated the patient and found that they have :improved  Social Determinants of Health: Lives independently  Disposition:  Patient's w/u overall reassuring. Her pain improved while in the ED and her dizziness improved w/ fluids. Possible orthostatic symptoms. Likely her pain was in her thoracic wall, possibly a muscle spasm or musculoskeletal pain. Patient is ready to be discharged. She is recommended to follow up with her primary care  physician within 1-2 weeks about her aneurysm as well for surveillance. DC w/ discharge instructions/return precautions. All questions answered to patient's satisfaction.    Co morbidities that complicate the patient evaluation  Past Medical History:  Diagnosis Date   Arthritis    right knee and foot- OSteo    Asthma    treated in 20's and 30's   Cancer (HCC)    skin- squamous basal   Cataract    Complication of anesthesia    very slow to awaken    GERD (gastroesophageal reflux disease)    prn Prilosec   Head injury, closed    10/26/15- years ago - 30ish   Shortness of breath dyspnea    with exertion   Vaso vagal episode    passed out 2 times, close to passing out 3 times   Vertigo    was treated with rehab March and April 2017, does exercises if she begins to have symptoms     Medicines Meds ordered this encounter  Medications   lactated ringers bolus 500 mL   iohexol (OMNIPAQUE) 350 MG/ML injection 100 mL    I have  reviewed the patients home medicines and have made adjustments as needed  Problem List / ED Course: Problem List Items Addressed This Visit   None Visit Diagnoses     Acute bilateral thoracic back pain    -  Primary   Dizziness                       This note was created using dictation software, which may contain spelling or grammatical errors.    Loetta Rough, MD 11/08/22 [phone]

## 2022-11-07 NOTE — ED Triage Notes (Signed)
BIBA from home for dizziness, back pain. Pt reports going to the gym earlier, came home, then had a "flash" of severe thoracic back pain, dizziness and nausea.

## 2022-11-07 NOTE — ED Notes (Signed)
2 unsuccessful attempts at larger bore IV.

## 2022-11-09 ENCOUNTER — Other Ambulatory Visit: Payer: Self-pay | Admitting: Family Medicine

## 2022-11-11 ENCOUNTER — Ambulatory Visit (INDEPENDENT_AMBULATORY_CARE_PROVIDER_SITE_OTHER): Payer: Medicare Other | Admitting: Family Medicine

## 2022-11-11 ENCOUNTER — Encounter: Payer: Self-pay | Admitting: Family Medicine

## 2022-11-11 VITALS — BP 138/70 | HR 61 | Temp 97.9°F | Ht 68.0 in | Wt 155.7 lb

## 2022-11-11 DIAGNOSIS — E785 Hyperlipidemia, unspecified: Secondary | ICD-10-CM | POA: Diagnosis not present

## 2022-11-11 DIAGNOSIS — K219 Gastro-esophageal reflux disease without esophagitis: Secondary | ICD-10-CM | POA: Diagnosis not present

## 2022-11-11 DIAGNOSIS — E871 Hypo-osmolality and hyponatremia: Secondary | ICD-10-CM

## 2022-11-11 DIAGNOSIS — I7123 Aneurysm of the descending thoracic aorta, without rupture: Secondary | ICD-10-CM | POA: Diagnosis not present

## 2022-11-11 MED ORDER — ATORVASTATIN CALCIUM 40 MG PO TABS: 40.0000 mg | ORAL_TABLET | Freq: Every day | ORAL | 3 refills | Status: DC

## 2022-11-11 NOTE — Progress Notes (Signed)
Established Patient Office Visit  Subjective   [name], female    DOB: 04-23-1945  Age: 77 y.o. 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  Seen for ER follow-up.  Notes from 11-07-2022 reviewed.  She had sudden onset after going to the gym earlier in the day of severe upper thoracic back pain near the midline.  She has some associated dizziness and diaphoresis and nausea but no vomiting.  Symptoms occurred at rest.  No chest pain.  Lasted a few minutes then improved.  Then occurred later in the day with some associated dizziness and lightheadedness.  No syncope.  Some radiation bilaterally toward the epigastric region.  She had EGD the day before which showed only mild gastritis and hiatal hernia but no other acute findings.  She denies any recent fever or chills.  No dyspnea.  No recent injuries.  EKG showed no acute abnormalities.  Labs were significant for sodium 131 and albumin was slightly low at 3.1.  White count 10.8 thousand.  CT angiogram of the chest and abdomen and pelvis showed 3.3 cm descending thoracic aortic aneurysm without rupture.  No other acute abnormalities noted.  She had thoracic MRI 12/23 which showed some degenerative changes.  She is pain-free at this time.  No thoracic pain at all.  Stays quite active.  Has noted improvement in GERD symptoms since starting Protonix.  Also much more energy since starting CPAP for obstructive sleep apnea.  Regarding her hyponatremia, this was new.  No diuretic use.  No recent vomiting or diarrhea.  Does not strictly limit salt intake.  Past Medical History:  Diagnosis Date   Arthritis    right knee and foot- OSteo    Asthma    treated in 20's and 30's   Cancer (HCC)    skin- squamous basal   Cataract    Complication of anesthesia    very slow to awaken     GERD (gastroesophageal reflux disease)    prn Prilosec   Head injury, closed    10/26/15- years ago - 30ish   Shortness of breath dyspnea    with exertion   Vaso vagal episode    passed out 2 times, close to passing out 3 times   Vertigo    was treated with rehab March and April 2017, does exercises if she begins to have symptoms   Past Surgical History:  Procedure Laterality Date   ABDOMINAL HYSTERECTOMY  1993   menorrhagia.   BREAST ENHANCEMENT SURGERY  1978   CATARACT EXTRACTION, BILATERAL     COLONOSCOPY W/ POLYPECTOMY     COLONOSCOPY WITH PROPOFOL N/A 02/03/2017   Procedure: COLONOSCOPY WITH PROPOFOL;  Surgeon: Jeani Hawking, MD;  Location: [facility];  Service: Endoscopy;  Laterality: N/A;   KNEE ARTHROSCOPY W/ MENISCAL REPAIR Right    right knee replaceent      TOTAL KNEE ARTHROPLASTY Right 11/03/2015   Procedure: RIGHT TOTAL KNEE ARTHROPLASTY;  Surgeon: Jodi Geralds, MD;  Location: [facility];  Service: Orthopedics;  Laterality: Right;   TUBAL LIGATION  1972   VARICOSE VEIN SURGERY  1978    reports that she quit smoking about 32 years ago. Her smoking use included cigarettes. She started smoking about 52 years ago. She has a 20.1 pack-year smoking history. She has never used smokeless tobacco. She  reports current alcohol use of about 7.0 standard drinks of alcohol per week. She reports that she does not use drugs. family history includes Cancer (age of onset: 34) in her father; Heart disease (age of onset: 34) in her mother. Allergies  Allergen Reactions   Other Rash    Rubie Maid cooling pads, severe itching   Adhesive [Tape] Hives    Use paper tape only    Review of Systems  Constitutional:  Negative for chills, fever, malaise/fatigue and weight loss.  Eyes:  Negative for blurred vision.  Respiratory:  Negative for shortness of breath.   Cardiovascular:  Negative for chest pain and leg swelling.  Gastrointestinal:  Negative for abdominal pain, blood in stool, diarrhea,  melena and vomiting.  Genitourinary:  Negative for dysuria.  Neurological:  Negative for loss of consciousness, weakness and headaches.       See HPI      Objective:     BP 138/70 (BP Location: Left Arm, Patient Position: Sitting, Cuff Size: Normal)   Pulse 61   Temp 97.9 F (36.6 C) (Oral)   Ht 5\' 8"  (1.727 m)   Wt 155 lb 11.2 oz (70.6 kg)   SpO2 100%   BMI 23.67 kg/m  BP Readings from Last 3 Encounters:  11/11/22 138/70  11/07/22 (!) 157/71  10/16/22 (!) 144/70   Wt Readings from Last 3 Encounters:  11/11/22 155 lb 11.2 oz (70.6 kg)  10/16/22 155 lb 14.4 oz (70.7 kg)  09/23/22 158 lb 9.6 oz (71.9 kg)      Physical Exam Vitals reviewed.  Constitutional:      Appearance: She is well-developed.  Eyes:     Pupils: Pupils are equal, round, and reactive to light.  Neck:     Thyroid: No thyromegaly.     Vascular: No JVD.  Cardiovascular:     Rate and Rhythm: Normal rate and regular rhythm.     Heart sounds:     No gallop.  Pulmonary:     Effort: Pulmonary effort is normal. No respiratory distress.     Breath sounds: Normal breath sounds. No wheezing or rales.  Abdominal:     Palpations: Abdomen is soft.     Tenderness: There is no abdominal tenderness.  Musculoskeletal:     Cervical back: Neck supple.     Right lower leg: No edema.     Left lower leg: No edema.  Neurological:     Mental Status: She is alert.      No results found for any visits on 11/11/22.  Last CBC Lab Results  Component Value Date   WBC 10.8 (H) 11/07/2022   HGB 12.3 11/07/2022   HCT 39.4 11/07/2022   MCV 96.6 11/07/2022   MCH 30.1 11/07/2022   RDW 14.6 11/07/2022   PLT 229 11/07/2022   Last metabolic panel Lab Results  Component Value Date   GLUCOSE 106 (H) 11/07/2022   NA 131 (L) 11/07/2022   K 3.9 11/07/2022   CL 97 (L) 11/07/2022   CO2 27 11/07/2022   BUN 17 11/07/2022   CREATININE 0.65 11/07/2022   GFRNONAA >60 11/07/2022   CALCIUM 8.4 (L) 11/07/2022   PROT 5.7  (L) 11/07/2022   ALBUMIN 3.1 (L) 11/07/2022   BILITOT 0.2 (L) 11/07/2022   ALKPHOS 64 11/07/2022   AST 28 11/07/2022   ALT 27 11/07/2022   ANIONGAP 7 11/07/2022   Last lipids Lab Results  Component Value Date   CHOL 136 09/09/2022   HDL  67.90 09/09/2022   LDLCALC 59 09/09/2022   LDLDIRECT 133.2 11/23/2010   TRIG 48.0 09/09/2022   CHOLHDL 2 09/09/2022      The 16-XWRU ASCVD risk score (Arnett DK, et al., 2019) is: 22.8%    Assessment & Plan:   #1 recent acute bilateral thoracic back pain of uncertain etiology.  Presume musculoskeletal.  Imaging showed no acute findings.  Pain resolved at this time.  Follow-up for any recurrence  #2 mild hyponatremia by recent labs.  No diuretic use.  No past history of hyponatremia.  Recheck comprehensive metabolic panel with future labs in a few weeks and workup further if persisting at that time  #3 small descending thoracic aortic aneurysm 3.3 cm.  Will need annual follow-up with MRA or CTA  #4 hyperlipidemia.  Patient requesting refill Lipitor.  This is refilled for 1 year.  #5 GERD improved on Protonix 40 mg daily.   No follow-ups on file.    Evelena Peat, MD

## 2022-11-11 NOTE — Patient Instructions (Signed)
Set up repeat labs for about one month  We need to get yearly follow up imaging to reassess the descending thoracic aneurysm.

## 2022-11-16 ENCOUNTER — Encounter: Payer: Self-pay | Admitting: Family Medicine

## 2022-11-17 ENCOUNTER — Encounter: Payer: Self-pay | Admitting: Family Medicine

## 2022-11-17 DIAGNOSIS — L600 Ingrowing nail: Secondary | ICD-10-CM

## 2022-12-11 ENCOUNTER — Other Ambulatory Visit: Payer: Medicare Other

## 2022-12-11 ENCOUNTER — Other Ambulatory Visit (INDEPENDENT_AMBULATORY_CARE_PROVIDER_SITE_OTHER): Payer: Medicare Other

## 2022-12-11 DIAGNOSIS — E871 Hypo-osmolality and hyponatremia: Secondary | ICD-10-CM | POA: Diagnosis not present

## 2022-12-11 LAB — COMPREHENSIVE METABOLIC PANEL
ALT: 18 U/L (ref 0–35)
AST: 29 U/L (ref 0–37)
Albumin: 3.5 g/dL (ref 3.5–5.2)
Alkaline Phosphatase: 77 U/L (ref 39–117)
BUN: 9 mg/dL (ref 6–23)
CO2: 29 meq/L (ref 19–32)
Calcium: 8.7 mg/dL (ref 8.4–10.5)
Chloride: 101 meq/L (ref 96–112)
Creatinine, Ser: 0.74 mg/dL (ref 0.40–1.20)
GFR: 77.9 mL/min (ref >60.00)
Glucose, Bld: 97 mg/dL (ref 70–99)
Potassium: 4.1 meq/L (ref 3.5–5.1)
Sodium: 137 meq/L (ref 135–145)
Total Bilirubin: 0.4 mg/dL (ref 0.2–1.2)
Total Protein: 6.2 g/dL (ref 6.0–8.3)

## 2022-12-18 NOTE — Telephone Encounter (Signed)
Pt called to say her toe is no better and she really would like that referral.  Please advise.

## 2022-12-19 ENCOUNTER — Ambulatory Visit (INDEPENDENT_AMBULATORY_CARE_PROVIDER_SITE_OTHER): Payer: Medicare Other | Admitting: Podiatry

## 2022-12-19 ENCOUNTER — Encounter: Payer: Self-pay | Admitting: Podiatry

## 2022-12-19 DIAGNOSIS — L600 Ingrowing nail: Secondary | ICD-10-CM

## 2022-12-19 DIAGNOSIS — R42 Dizziness and giddiness: Secondary | ICD-10-CM | POA: Insufficient documentation

## 2022-12-19 MED ORDER — NEOMYCIN-POLYMYXIN-HC 3.5-10000-1 OT SUSP: OTIC | 0 refills | Status: DC

## 2022-12-19 NOTE — Patient Instructions (Signed)

## 2022-12-22 NOTE — Progress Notes (Addendum)
HPI F former smoker (20 pk yrs) followed for OSA, EDS, complicated by TIA, Asthma, GERD, Ischemic Colitis, Diverticulosis, Rosacea,  HST 03/27/21- AHI 14.1/ hr, desaturation to 81%, body weight 152 lbs Husband  Scherrie Gerlach our pt, uses [facility] for CPAP.   ========================================================  06/12/21- 76 yoF former smoker (20 pk yrs) followed for OSA, EDS, complicated by TIA, Asthma, GERD, Ischemic Colitis, Diverticulosis, Rosacea, ? MS?,  HST 03/27/21- AHI 14.1/ hr, desaturation to 81%, body weight 152 lbs Husband  Scherrie Gerlach our pt, uses [facility] for CPAP. Body weight today-153 lbs Covid vax-4 Phizer Flu vax- had ----Patient was seen by Dr. Alveda Reasons office and stated that she didn't want to proceed because she has several questions and wants more information.  We had referred to Dr Toni Arthurs, DDS for OAP. Comes back to discuss her sleep study. Her primary sleep symptom is daytime fatigue.  She sees her husband's experience with CPAP and hopes for alternatives for herself.  Long discussion.  I discussed the significance of mild OSA.  She wants to wait on any treatment at this time and to come back in a year for reconsideration.  We did discuss conservative measures including keeping weight down, sleeping off flat of back.  09/23/22- 89 yoF former smoker (20 pk yrs) followed for OSA, EDS, complicated by TIA, Asthma, GERD, Ischemic Colitis, Diverticulosis, Rosacea, Aortic Aneurysm. Ischemic Colitis,  Body weight today-158 lbs Consider repeat sleep study Husband our pt Jonette Pesa, our patient. She has been using his spare machine and is ready to get her own.  12/24/22- 20 yoF former smoker (20 pk yrs) followed for OSA, EDS, complicated by TIA, Asthma, GERD, Ischemic Colitis, Diverticulosis, Rosacea, (Husband  Scherrie Gerlach our pt, uses [facility] for CPAP.) CPAP auto 4-15/ [facility] new 09/23/22- Download compliance- 97%, AHI 1.9/hr Body weight today-155 lbs Very  happy with CPAP. No more EDS. Comfortable. CT a- 11/07/22 IMPRESSION: 1. No acute intrathoracic or intra-abdominal pathology identified. No etiology identified for the patient's reported symptoms. 2. 3.3 cm proximal descending thoracic aortic aneurysm. Recommend annual imaging followup by CTA or MRA. This recommendation follows 2010 ACCF/AHA/AATS/ACR/ASA/SCA/SCAI/SIR/STS/SVM Guidelines for the Diagnosis and Management of Patients with Thoracic Aortic Disease. Circulation.2010; 121: Z610-R604. Aortic aneurysm NOS (ICD10-I71.9) 3. Mosaic attenuation of the pulmonary parenchyma relates to multifocal air trapping likely related to small airways disease. 4. Small hiatal hernia. Aortic Atherosclerosis (ICD10-I70.0).  Addendum 03/13/23- Barring acute problems not reported, she is clear from Sleep Medicine standpoint for planned surgery. She should wear CPAP auto 4-15 in Recovery after surgery.  ROS-see HPI   + = positive Constitutional:    weight loss, night sweats, fevers, chills, +fatigue, lassitude. HEENT:    headaches, difficulty swallowing, tooth/dental problems, sore throat,       +sneezing, +itching, ear ache, nasal congestion, post nasal drip, snoring CV:    chest pain, orthopnea, PND, swelling in lower extremities, anasarca,                                   dizziness, palpitations Resp:   shortness of breath with exertion or at rest.                productive cough,   non-productive cough, coughing up of blood.              change in color of mucus.  wheezing.   Skin:    +rosacea GI:  +heartburn, indigestion, +abdominal  pain, nausea, vomiting, diarrhea,                 change in bowel habits, loss of appetite GU: dysuria, change in color of urine, no urgency or frequency.   flank pain. MS:   +joint pain, stiffness, decreased range of motion, back pain. Neuro-     nothing unusual Psych:  change in mood or affect.  depression or anxiety.   memory loss.  OBJ- Physical Exam General-  Alert, Oriented, Affect-appropriate, Distress- none acute, not obese Skin- rash-none, lesions- none, excoriation- none Lymphadenopathy- none Head- atraumatic            Eyes- Gross vision intact, PERRLA, conjunctivae and secretions clear            Ears- Hearing, canals-normal            Nose- Clear, no-Septal dev, mucus, polyps, erosion, perforation             Throat- Mallampati III, mucosa clear , drainage- none, tonsils- atrophic, + teeth Neck- flexible , trachea midline, no stridor , thyroid nl, carotid no bruit Chest - symmetrical excursion , unlabored           Heart/CV- RRR , no murmur , no gallop  , no rub, nl s1 s2                           - JVD- none , edema- none, stasis changes- none, varices- none           Lung- clear to P&A, wheeze- none, cough- none , dullness-none, rub- none           Chest wall-  Abd-  Br/ Gen/ Rectal- Not done, not indicated Extrem- cyanosis- none, clubbing, none, atrophy- none, strength- nl Neuro- grossly intact to observation

## 2022-12-22 NOTE — Progress Notes (Signed)
  Subjective:  [name], female    DOB: 04-17-1945,  MRN: [id number]  Chief Complaint  Patient presents with   Toe Pain    Hallux left - medial border, feels ingrown, tender x months, gets regular pedicures and they trim it sometimes, soaks feet    New Patient (Initial Visit)    77 y.o. female presents with the above complaint. History confirmed with patient.   Objective:  Physical Exam: warm, good capillary refill, no trophic changes or ulcerative lesions, normal DP and PT pulses, normal sensory exam, and ingrown left hallux medial border  Assessment:   1. Ingrowing left great toenail      Plan:  Patient was evaluated and treated and all questions answered.    Ingrown Nail, left -Patient elects to proceed with minor surgery to remove ingrown toenail today. Consent reviewed and signed by patient. -Ingrown nail excised. See procedure note. -Educated on post-procedure care including soaking. Written instructions provided and reviewed. -Rx for Cortisporin sent to pharmacy. -Advised on signs and symptoms of infection developing.  We discussed that the phenol likely will create some redness and edema and tenderness around the nailbed as long as it is localized this is to be expected.  Will return as needed if any infection signs develop  Procedure: Excision of Ingrown Toenail Location: Left 1st toe medial nail borders. Anesthesia: Lidocaine 1% plain; 1.5 mL and Marcaine 0.5% plain; 1.5 mL, digital block. Skin Prep: Betadine. Dressing: Silvadene; telfa; dry, sterile, compression dressing. Technique: Following skin prep, the toe was exsanguinated and a tourniquet was secured at the base of the toe. The affected nail border was freed, split with a nail splitter, and excised. Chemical matrixectomy was then performed with phenol and irrigated out with alcohol. The tourniquet was then removed and sterile dressing applied. Disposition: Patient tolerated procedure well.     Return if symptoms worsen or fail to improve.

## 2022-12-24 ENCOUNTER — Encounter: Payer: Self-pay | Admitting: Internal Medicine

## 2022-12-24 ENCOUNTER — Ambulatory Visit: Payer: Medicare Other | Admitting: Internal Medicine

## 2022-12-24 VITALS — BP 112/68 | HR 66 | Ht 68.0 in | Wt 155.6 lb

## 2022-12-24 DIAGNOSIS — Z23 Encounter for immunization: Secondary | ICD-10-CM

## 2022-12-24 DIAGNOSIS — I7123 Aneurysm of the descending thoracic aorta, without rupture: Secondary | ICD-10-CM

## 2022-12-24 DIAGNOSIS — G4733 Obstructive sleep apnea (adult) (pediatric): Secondary | ICD-10-CM | POA: Diagnosis not present

## 2022-12-24 NOTE — Patient Instructions (Signed)
You are doing great with CPAP. We can continue auto 4-15.  Order- flu vax senior

## 2022-12-27 NOTE — Assessment & Plan Note (Signed)
Noted on CT. Followed elsewhere.

## 2022-12-27 NOTE — Assessment & Plan Note (Signed)
Benefits from CPAP with good compliance and control Plan- continue auto 4-15 

## 2023-01-07 ENCOUNTER — Telehealth: Payer: Self-pay | Admitting: Family Medicine

## 2023-01-07 DIAGNOSIS — Z45819 Encounter for adjustment or removal of unspecified breast implant: Secondary | ICD-10-CM

## 2023-01-07 NOTE — Telephone Encounter (Signed)
Pt called back to say she has no particular preference, as long as it does not interfere with her insurance... she just wants them removed.

## 2023-01-07 NOTE — Telephone Encounter (Signed)
Requesting referral for to a plastic surgeon for a consult to remove breast implants

## 2023-01-08 NOTE — Telephone Encounter (Signed)
Referral placed and left detailed message on vm informing patient this has been done.

## 2023-01-08 NOTE — Addendum Note (Signed)
Addended by: Christy Sartorius on: 01/08/2023 11:18 AM   Modules accepted: Orders

## 2023-01-18 ENCOUNTER — Ambulatory Visit: Payer: Self-pay

## 2023-01-18 ENCOUNTER — Other Ambulatory Visit: Payer: Self-pay

## 2023-01-18 ENCOUNTER — Ambulatory Visit
Admission: EM | Admit: 2023-01-18 | Discharge: 2023-01-18 | Disposition: A | Discharge status: Home / Self Care | Payer: Medicare Other | Attending: Family Medicine | Admitting: Family Medicine

## 2023-01-18 DIAGNOSIS — N3091 Cystitis, unspecified with hematuria: Secondary | ICD-10-CM | POA: Diagnosis not present

## 2023-01-18 DIAGNOSIS — R300 Dysuria: Secondary | ICD-10-CM | POA: Diagnosis present

## 2023-01-18 DIAGNOSIS — N3090 Cystitis, unspecified without hematuria: Secondary | ICD-10-CM

## 2023-01-18 DIAGNOSIS — R319 Hematuria, unspecified: Secondary | ICD-10-CM | POA: Diagnosis present

## 2023-01-18 DIAGNOSIS — M6281 Muscle weakness (generalized): Secondary | ICD-10-CM | POA: Diagnosis present

## 2023-01-18 LAB — POCT URINALYSIS DIP (MANUAL ENTRY)
Glucose, UA: NEGATIVE mg/dL
Nitrite, UA: POSITIVE — AB
Protein Ur, POC: >=300 mg/dL — AB
Spec Grav, UA: 1.02 (ref 1.010–1.025)
Urobilinogen, UA: 1 U/dL
pH, UA: 7 (ref 5.0–8.0)

## 2023-01-18 MED ORDER — CIPROFLOXACIN HCL 500 MG PO TABS: 500.0000 mg | ORAL_TABLET | Freq: Two times a day (BID) | ORAL | 0 refills | Status: DC

## 2023-01-18 NOTE — ED Provider Notes (Signed)
[facility]    CSN: [id number] Arrival date & time: 01/18/23  1035      History   Chief Complaint Chief Complaint  Patient presents with   Dysuria    HPI Mia Peterson is a 77 y.o. female.   Woke up this morning with a bladder infection.  She states she had dysuria, frequency, inability to empty her bladder and gross hematuria.  She has had these before, perhaps once a year.  No abdominal pain, no flank pain, no fever or chills.  No history of kidney stones or kidney infections.    Past Medical History:  Diagnosis Date   Arthritis    right knee and foot- OSteo    Asthma    treated in 20's and 30's   Cancer (HCC)    skin- squamous basal   Cataract    Complication of anesthesia    very slow to awaken    GERD (gastroesophageal reflux disease)    prn Prilosec   Head injury, closed    10/26/15- years ago - 30ish   Shortness of breath dyspnea    with exertion   Vaso vagal episode    passed out 2 times, close to passing out 3 times   Vertigo    was treated with rehab March and April 2017, does exercises if she begins to have symptoms    Patient Active Problem List   Diagnosis Date Noted   Vertigo 12/19/2022   Descending thoracic aortic aneurysm (HCC) 11/11/2022   A few old MS plaques likely remission following with serial MRIS.Demyelinating disease of central nervous system (HCC) 07/17/2022   Other fatigue 07/17/2022   OSA (obstructive sleep apnea) 07/03/2021   Primary osteoarthritis involving multiple joints 05/30/2021   Somnolence 01/26/2021   Fecal urgency 03/21/2020   First degree hemorrhoids 03/21/2020   Incontinence of feces 03/21/2020   Ischemic colitis (HCC) 03/21/2020   Nausea 03/21/2020   Screening for malignant neoplasm of colon 03/21/2020   Osteopenia 04/13/2018   Dupuytren contracture 04/13/2018   Degenerative arthritis 04/13/2018   History of TIA (transient ischemic attack) 03/10/2017   Rectal bleeding 02/02/2017   Rectal bleed  02/01/2017   TIA (transient ischemic attack) 09/30/2016   Primary osteoarthritis of right knee 11/03/2015   Malignant basal cell neoplasm of skin 11/23/2010   Squamous cell skin cancer 11/23/2010   HEMATURIA UNSPECIFIED 10/09/2009   ROSACEA 11/11/2008   URGENCY OF URINATION 11/11/2008   POSTMENOPAUSAL STATUS 11/11/2008   GERD 07/27/2008    Past Surgical History:  Procedure Laterality Date   ABDOMINAL HYSTERECTOMY  1993   menorrhagia.   BREAST ENHANCEMENT SURGERY  1978   CATARACT EXTRACTION, BILATERAL     COLONOSCOPY W/ POLYPECTOMY     COLONOSCOPY WITH PROPOFOL N/A 02/03/2017   Procedure: COLONOSCOPY WITH PROPOFOL;  Surgeon: Jeani Hawking, MD;  Location: [facility];  Service: Endoscopy;  Laterality: N/A;   KNEE ARTHROSCOPY W/ MENISCAL REPAIR Right    right knee replaceent      TOTAL KNEE ARTHROPLASTY Right 11/03/2015   Procedure: RIGHT TOTAL KNEE ARTHROPLASTY;  Surgeon: Jodi Geralds, MD;  Location: [facility];  Service: Orthopedics;  Laterality: Right;   TUBAL LIGATION  1972   VARICOSE VEIN SURGERY  1978    OB History   No obstetric history on file.      Home Medications    Prior to Admission medications   Medication Sig Start Date End Date Taking? Authorizing Provider  ciprofloxacin (CIPRO) 500 MG tablet Take 1  tablet (500 mg total) by mouth every 12 (twelve) hours. 01/18/23  Yes Eustace Moore, MD  aspirin EC 81 MG tablet Take 81 mg by mouth daily.    [provider]  atorvastatin (LIPITOR) 40 MG tablet Take 1 tablet (40 mg total) by mouth daily. 11/11/22   Burchette, Elberta Fortis, MD  calcium-vitamin D (OSCAL WITH D) 500-200 MG-UNIT TABS tablet Take by mouth.    [provider]  CELEBREX 200 MG capsule Take 200 mg by mouth 2 (two) times daily. 12/06/22   [provider]  cycloSPORINE (RESTASIS) 0.05 % ophthalmic emulsion Place 1 drop into both eyes 2 (two) times daily.    [provider]  Flaxseed, Linseed, (FLAX SEED OIL) 1000 MG CAPS Take  1,000 mg by mouth daily.    [provider]  Ginger, Zingiber officinalis, (GINGER ROOT) 550 MG CAPS Take 550 mg by mouth daily.    [provider]  Multiple Vitamins-Minerals (ONE DAILY WOMENS 50+ PO) Take 1 tablet by mouth daily.    [provider]  oxybutynin (DITROPAN-XL) 5 MG 24 hr tablet Take 5 mg by mouth at bedtime.    [provider]  pantoprazole (PROTONIX) 40 MG tablet Take 1 tablet (40 mg total) by mouth daily. 10/16/22   Burchette, Elberta Fortis, MD  POTASSIUM PO Take 1 tablet by mouth daily.    [provider]  tretinoin (RETIN-A) 0.025 % cream SMARTSIG:1 Application Topical Every Evening 10/03/22   [provider]  TURMERIC PO Take 400 mg by mouth 2 (two) times daily.     [provider]    Family History Family History  Problem Relation Age of Onset   Heart disease Mother 6   Cancer Father 39       pancreatic   Ataxia Neg Hx     Social History Social History   Tobacco Use   Smoking status: Former    Current packs/day: 0.00    Average packs/day: 1 pack/day for 20.1 years (20.1 ttl pk-yrs)    Types: Cigarettes    Start date: 79    Quit date: 03/24/1990    Years since quitting: 32.8   Smokeless tobacco: Never  Vaping Use   Vaping status: Never Used  Substance Use Topics   Alcohol use: Yes    Alcohol/week: 7.0 standard drinks of alcohol    Types: 7 Glasses of wine per week    Comment: 3 times week   Drug use: No     Allergies   Adhesive [tape] and Other   Review of Systems Review of Systems See HPI  Physical Exam Triage Vital Signs ED Triage Vitals  Encounter Vitals Group     BP 01/18/23 1039 139/78     Systolic BP Percentile --      Diastolic BP Percentile --      Pulse Rate 01/18/23 1039 63     Resp 01/18/23 1039 16     Temp 01/18/23 1039 98.5 F (36.9 C)     Temp Source 01/18/23 1039 Oral     SpO2 01/18/23 1039 99 %     Weight --      Height --      Head Circumference --      Peak  Flow --      Pain Score 01/18/23 1042 4     Pain Loc --      Pain Education --      Exclude from Growth Chart --  No data found.  Updated Vital Signs BP 139/78   Pulse 63   Temp 98.5 F (36.9 C) (Oral)   Resp 16   SpO2 99%      Physical Exam Constitutional:      General: She is not in acute distress.    Appearance: She is well-developed and normal weight.  HENT:     Head: Normocephalic and atraumatic.  Eyes:     Conjunctiva/sclera: Conjunctivae normal.     Pupils: Pupils are equal, round, and reactive to light.  Cardiovascular:     Rate and Rhythm: Normal rate.  Pulmonary:     Effort: Pulmonary effort is normal. No respiratory distress.  Abdominal:     General: There is no distension.     Palpations: Abdomen is soft.     Tenderness: There is no right CVA tenderness or left CVA tenderness.  Musculoskeletal:        General: Normal range of motion.     Cervical back: Normal range of motion.  Skin:    General: Skin is warm and dry.  Neurological:     Mental Status: She is alert.      UC Treatments / Results  Labs (all labs ordered are listed, but only abnormal results are displayed) Labs Reviewed  POCT URINALYSIS DIP (MANUAL ENTRY) - Abnormal; Notable for the following components:      Result Value   Color, UA red (*)    Clarity, UA cloudy (*)    Bilirubin, UA small (*)    Ketones, POC UA small (15) (*)    Blood, UA large (*)    Protein Ur, POC >=300 (*)    Nitrite, UA Positive (*)    Leukocytes, UA Large (3+) (*)    All other components within normal limits  URINE CULTURE    EKG   Radiology No results found.  Procedures Procedures (including critical care time)  Medications Ordered in UC Medications - No data to display  Initial Impression / Assessment and Plan / UC Course  I have reviewed the triage vital signs and the nursing notes.  Pertinent labs & imaging results that were available during my care of the patient were reviewed by me and  considered in my medical decision making (see chart for details).     Urine is grossly positive for indicators of urinary tract infection.  Will treat and do a culture Final Clinical Impressions(s) / UC Diagnoses   Final diagnoses:  Cystitis     Discharge Instructions      Take the antibiotic 2 x a day for 5 days Take 2 doses today Drink lots of water  The culture will be available in 2-3 days.  You will be called in any change in antibiotics is needed   ED Prescriptions     Medication Sig Dispense Auth. Provider   ciprofloxacin (CIPRO) 500 MG tablet Take 1 tablet (500 mg total) by mouth every 12 (twelve) hours. 10 tablet Eustace Moore, MD      PDMP not reviewed this encounter.   Eustace Moore, MD 01/18/23 [phone]

## 2023-01-18 NOTE — Discharge Instructions (Signed)
Take the antibiotic 2 x a day for 5 days Take 2 doses today Drink lots of water  The culture will be available in 2-3 days.  You will be called in any change in antibiotics is needed

## 2023-01-18 NOTE — ED Triage Notes (Signed)
Woke up this morning with needing to urinate, but unable to urinate much. Also noted blood in urine. Feeling weak also. No fever.

## 2023-01-20 LAB — URINE CULTURE
Culture: >=100000 — AB
Special Requests: NORMAL

## 2023-01-31 ENCOUNTER — Encounter: Payer: Self-pay | Admitting: Family Medicine

## 2023-02-02 ENCOUNTER — Other Ambulatory Visit: Payer: Self-pay | Admitting: Family Medicine

## 2023-02-03 MED ORDER — PANTOPRAZOLE SODIUM 40 MG PO TBEC: 40.0000 mg | DELAYED_RELEASE_TABLET | Freq: Every day | ORAL | 3 refills | Status: DC

## 2023-03-03 ENCOUNTER — Ambulatory Visit: Payer: Medicare Other | Admitting: Family Medicine

## 2023-03-04 ENCOUNTER — Encounter: Payer: Self-pay | Admitting: Family Medicine

## 2023-03-04 ENCOUNTER — Ambulatory Visit (INDEPENDENT_AMBULATORY_CARE_PROVIDER_SITE_OTHER): Payer: Medicare Other | Admitting: Family Medicine

## 2023-03-04 VITALS — BP 150/80 | HR 60 | Temp 97.5°F | Wt 156.4 lb

## 2023-03-04 DIAGNOSIS — Z01818 Encounter for other preprocedural examination: Secondary | ICD-10-CM

## 2023-03-04 DIAGNOSIS — Z8673 Personal history of transient ischemic attack (TIA), and cerebral infarction without residual deficits: Secondary | ICD-10-CM | POA: Diagnosis not present

## 2023-03-04 NOTE — Progress Notes (Signed)
 Established Patient Office Visit  Subjective   [name], female    DOB: 03-10-1945  Age: 79 y.o. MRN: [id number]  No chief complaint on file.   HPI   Mia Peterson is seen today for preoperative clearance for right shoulder reverse arthroplasty.  This is yet to be scheduled.  She has past medical history significant for a questionable history of TIA years ago, obstructive sleep apnea, GERD, osteopenia, osteoarthritis involving multiple joints, descending thoracic aortic aneurysm 3.3 cm.  She does take aspirin  81 mg daily.  Very health-conscious.  Exercises regularly.  She has been limited in walking because of chronic foot pain the rides recumbent bike 30 minutes/day and also does water  aerobics at least 2 days/week.  No recent chest pains.  No chronic lung issues.  Non-smoker.  Had recent EKG back in September which showed some PACs.  No recent palpitations.  No dizziness or syncope.  Has no history of diabetes.  A comprehensive metabolic panel back in October which was normal.  Past Medical History:  Diagnosis Date   Arthritis    right knee and foot- [facility]    Asthma    treated in 20's and 30's   Cancer (HCC)    skin- squamous basal   Cataract    Complication of anesthesia    very slow to awaken    GERD (gastroesophageal reflux disease)    prn Prilosec   Head injury, closed    10/26/15- years ago - 30ish   Shortness of breath dyspnea    with exertion   Vaso vagal episode    passed out 2 times, close to passing out 3 times   Vertigo    was treated with rehab March and April 2017, does exercises if she begins to have symptoms   Past Surgical History:  Procedure Laterality Date   ABDOMINAL HYSTERECTOMY  1993   menorrhagia.   BREAST ENHANCEMENT SURGERY  1978   CATARACT EXTRACTION, BILATERAL     COLONOSCOPY W/ POLYPECTOMY     COLONOSCOPY WITH PROPOFOL  N/A 02/03/2017   Procedure: COLONOSCOPY WITH PROPOFOL ;  Surgeon: Rollin Dover, MD;  Location: [facility];   Service: Endoscopy;  Laterality: N/A;   KNEE ARTHROSCOPY W/ MENISCAL REPAIR Right    right knee replaceent      TOTAL KNEE ARTHROPLASTY Right 11/03/2015   Procedure: RIGHT TOTAL KNEE ARTHROPLASTY;  Surgeon: Norleen Gavel, MD;  Location: [facility];  Service: Orthopedics;  Laterality: Right;   TUBAL LIGATION  1972   VARICOSE VEIN SURGERY  1978    reports that she quit smoking about 32 years ago. Her smoking use included cigarettes. She started smoking about 53 years ago. She has a 20.1 pack-year smoking history. She has never used smokeless tobacco. She reports current alcohol  use of about 7.0 standard drinks of alcohol  per week. She reports that she does not use drugs. family history includes Cancer (age of onset: 106) in her father; Heart disease (age of onset: 31) in her mother. Allergies  Allergen Reactions   Adhesive [Tape] Hives    Use paper tape only   Other Rash    Ronal Murray cooling pads, severe itching    Review of Systems  Constitutional:  Negative for chills, fever and malaise/fatigue.  Eyes:  Negative for blurred vision.  Respiratory:  Negative for shortness of breath.   Cardiovascular:  Negative for chest pain.  Neurological:  Negative for dizziness, weakness and headaches.      Objective:  BP (!) 150/80 (BP Location: Left Arm, Patient Position: Sitting, Cuff Size: Normal)   Pulse 60   Temp (!) 97.5 F (36.4 C) (Oral)   Wt 156 lb 6.4 oz (70.9 kg)   SpO2 95%   BMI 23.78 kg/m  BP Readings from Last 3 Encounters:  03/04/23 (!) 150/80  01/18/23 139/78  12/24/22 112/68   Wt Readings from Last 3 Encounters:  03/04/23 156 lb 6.4 oz (70.9 kg)  12/24/22 155 lb 9.6 oz (70.6 kg)  11/11/22 155 lb 11.2 oz (70.6 kg)      Physical Exam Vitals reviewed.  Constitutional:      General: She is not in acute distress.    Appearance: She is well-developed.  Eyes:     Pupils: Pupils are equal, round, and reactive to light.  Neck:     Thyroid : No thyromegaly.     Vascular: No  JVD.  Cardiovascular:     Rate and Rhythm: Normal rate.     Heart sounds:     No gallop.     Comments: Occasional premature beat Pulmonary:     Effort: Pulmonary effort is normal. No respiratory distress.     Breath sounds: Normal breath sounds. No wheezing or rales.  Musculoskeletal:     Cervical back: Neck supple.     Right lower leg: No edema.     Left lower leg: No edema.  Neurological:     Mental Status: She is alert.      No results found for any visits on 03/04/23.  Last CBC Lab Results  Component Value Date   WBC 10.8 (H) 11/07/2022   HGB 12.3 11/07/2022   HCT 39.4 11/07/2022   MCV 96.6 11/07/2022   MCH 30.1 11/07/2022   RDW 14.6 11/07/2022   PLT 229 11/07/2022   Last metabolic panel Lab Results  Component Value Date   GLUCOSE 97 12/11/2022   NA 137 12/11/2022   K 4.1 12/11/2022   CL 101 12/11/2022   CO2 29 12/11/2022   BUN 9 12/11/2022   CREATININE 0.74 12/11/2022   GFR 77.90 12/11/2022   CALCIUM  8.7 12/11/2022   PROT 6.2 12/11/2022   ALBUMIN 3.5 12/11/2022   BILITOT 0.4 12/11/2022   ALKPHOS 77 12/11/2022   AST 29 12/11/2022   ALT 18 12/11/2022   ANIONGAP 7 11/07/2022   Last thyroid  functions Lab Results  Component Value Date   TSH 2.130 07/17/2022      The 10-year ASCVD risk score (Arnett DK, et al., 2019) is: 26.2%    Assessment & Plan:   Pre-operative clearance for reverse right shoulder arthroplasty.  Remote history of questionable TIA.  Patient has no recent acute concerning symptoms.  Unremarkable exam.  No known contraindications.  Recent EKG in September showed some PACs otherwise unremarkable.  She has no history of diabetes.  No chronic lung or heart problems.  -Forms completed -Patient instructed to hold aspirin  at least 5 days prior to procedure -She should be getting preop labs presumably through orthopedics  Total of 30 minutes was spent in combination of face-to-face time along with chart review, review of recent labs and  EKG, and documentation Wolm Scarlet, MD

## 2023-03-04 NOTE — Patient Instructions (Addendum)
 Be sure to HOLD Aspirin for 5 days prior to surgery.   Monitor blood pressure at home and be in touch if consistently > 140/90

## 2023-03-12 ENCOUNTER — Ambulatory Visit (INDEPENDENT_AMBULATORY_CARE_PROVIDER_SITE_OTHER): Payer: Medicare Other | Admitting: Neurology

## 2023-03-12 ENCOUNTER — Encounter: Payer: Self-pay | Admitting: Neurology

## 2023-03-12 VITALS — BP 150/86 | HR 58 | Ht 68.0 in | Wt 158.0 lb

## 2023-03-12 DIAGNOSIS — G379 Demyelinating disease of central nervous system, unspecified: Secondary | ICD-10-CM | POA: Diagnosis not present

## 2023-03-12 MED ORDER — PANTOPRAZOLE SODIUM 40 MG PO TBEC: 40.0000 mg | DELAYED_RELEASE_TABLET | Freq: Every day | ORAL | 3 refills | Status: DC

## 2023-03-12 NOTE — Addendum Note (Signed)
 Addended by: Aurelio Leer on: 03/12/2023 01:26 PM   Modules accepted: Orders

## 2023-03-12 NOTE — Progress Notes (Signed)
Reason for visit: Dizziness, syncope  Referring physician: Dr. [name] is a 78 y.o. female    03/12/2023: Here for follow up. She is doing well except she need shoulder surgery and needs to have paperwork filled out. She is having shoulder surgery, she may need to get her hip operated on, also her foot for the last 2 years is hard to walk she has arthritic pain so she cannot be as active and this is very hard, she can't even do water aerobics. Her balance is stable. She has a difficult time coming up with words and names. Some days she is fine and others she isn;t. She will think of it. Explained this is normal cognitive aging. She is highly intelligent and discussed she is likely noticing normal decline but offered to work her up. She is on a cpap and she feels much better now. She still gets tired but she doesn;t sleep in the afternoon. She is doing great on cpap. No new balance issues, no new weakness, numbness. No falls. No vertigo/dizziness this year. Has episodes, last year lasted about 2 weeks. Nothing for over a year now, seems to hit in November.   Patient complains of symptoms per HPI as well as the following symptoms: none . Pertinent negatives and positives per HPI. All others negative   07/17/2022: 06/12/21- 76 yoF former smoker (20 pk yrs) followed for OSA, EDS, complicated by TIA, Asthma, GERD, Ischemic Colitis, Diverticulosis, Rosacea,  HST 03/27/21- AHI 14.1/ hr, desaturation to 81%, body weight 152 lbs Husband  Mia Peterson our pt, uses [facility] for CPAP. Body weight today-153 lbs Covid vax-4 Phizer Flu vax- had Recommend go and have this completed, decond test offered  Memory is fine. This past year, no new symptoms, no loss of vision, no weakness, numbness, tingling.   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  MS not actove, in remission.  Patient complains of symptoms per HPI as well as the following symptoms: fatigue . Pertinent negatives and positives per HPI. All others negative   01/15/2022: Patient has no ne symptoms. 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 MRI of the brain and cervical spine showed 2 lesions that taken together may indicate a PMHx of MS but unlikely still active. We reviewed imaging for extended eriod of time, discussed differential and a lot about MS. Lesion int he brain stable from last imaging but never had an mri cervical spine no timeframe for lesion in the c-spine but need to follow since could be cause of her reported imabalnce  02/24/2022: MRI c-spine FINDINGS: :  On sagittal images, the spine is imaged from above the cervicomedullary junction to T2.   The cervicomedullary junction appears normal.  Paravertebral soft tissue appears normal.  The spinal cord is of normal caliber    there is a small T2 hyperintense focus posteriorly adjacent to C4.  This is unchanged compared to the 11/10/2021 MRI.  The spinal cord is otherwise normal.  There is 2 mm anterolisthesis of T1 upon T2.  There is a hemangioma within the T4 vertebral body.  Otherwise, the vertebral bodies have normal signal.   The discs and interspaces were further evaluated on  axial views from C2 to T1 as follows:   C2-C3: Minimal disc bulging and minimal left facet hypertrophy.  The level is otherwise normal and there is no spinal stenosis or nerve root compression.   C3-C4: Small left paramedian disc protrusion, left uncovertebral spurring, minimal facet hypertrophy and mild ligamenta flava hypertrophy causes mild to moderate left foraminal narrowing but no spinal stenosis or nerve root compression.   C4-C5: There is mild disc bulging, minimal facet hypertrophy and minimal ligamenta flava  hypertrophy causing borderline spinal stenosis but no significant foraminal narrowing.  There is no nerve root compression.Marland Kitchen     C5-C6: There is disc bulging, uncovertebral spurring and reduced disc height causing mild spinal stenosis and mild left foraminal narrowing but no nerve root compression.     C6-C7:  There is a small left paramedian disc protrusion and mild uncovertebral spurring causing mild spinal stenosis and left greater than right  foraminal narrowing but no nerve root compression.   C7-T1: There is minimal disc bulging, reduced disc height and minimal uncovertebral spurring but no foraminal narrowing, spinal stenosis or nerve root compression.     Degenerative changes are essentially unchanged compared to the 11/10/2021 MRI.   MRI brain 01/2022   IMPRESSION: This MRI of the cervical spine without contrast shows the following: Small T2 hyperintense focus in the posterior spinal cord adjacent to C4 is unchanged compared to the 11/10/2021 MRI.  It is nonspecific and could represent a focus of chronic demyelination.  As there is only borderline spinal stenosis at C4-C5, it is unlikely to be due to compressive myelopathy. Multilevel degenerative changes as detailed above causing borderline spinal stenosis at C4-C5 and mild spinal stenosis at C5-C6 and C6-C7.  There are various degrees of foraminal narrowing though there does not appear to be nerve root compression at any of the cervical levels. There do not appear to be any significant changes compared to the 11/11/2018 2020 MRI.  02-16-2022 MRI Brain IMPRESSION: This MRI of the brain without contrast shows the following: Generalized cortical atrophy that is most pronounced in the frontal, parietal and anterior temporal lobes.  This is unchanged compared to the 11/10/2021 MRI. Scattered stable T2/FLAIR hyperintense foci in the periventricular, subcortical and deep white matter.  Most of the foci are nonspecific and they could represent  chronic ischemic and/or demyelinating foci.  1 focus in the left frontal lobe has an appearance most consistent with a small chronic lacunar infarction. No acute findings.  No change compared to the 11/10/2021 MRI.  MRI thoracic 01/2022:  IMPRESSION: This MRI of the thoracic spine without contrast shows the following: The spinal cord appears normal. Degenerative changes at T1-T2 and from T9-T10 through L1-L2 as detailed above.  There is no spinal stenosis or nerve root compression.      11/10/2021: personally reviewed images with neuroradiologist and also with patient agee with findings  MRI brain: IMPRESSION: This MRI of the brain with and without contrast shows the following: Mild to moderate generalized cortical atrophy, most pronounced in the frontal and parietal lobes. Scattered T2/FLAIR hyperintense foci in the hemispheres most consistent with chronic microvascular ischemic change.  Demyelination is much less likely to have this appearance. No acute findings.  Normal enhancement pattern.   MRI cervical spine: IMPRESSION: This MRI of the cervical spine without contrast shows the following: Adjacent to C4, there is a small T2 hyperintense focus in the posterior spinal cord.  This is nonspecific but could represent a focus of remote demyelination or  inflammation. Multilevel degenerative changes as detailed above, mostly mild, leading to borderline spinal stenosis at C4-C5 and mild spinal stenosis at C5-C6 and C6-C7.  There is no spinal cord compression.  No nerve root compression. Patient complains of symptoms per HPI as well as the following symptoms: imbalance . Pertinent negatives and positives per HPI. All others negative     Follow up 10/10/2021 AA: This is a patient of Dr. Clarisa Kindred who is transitioning to me, last seen in February 2021, Dr. Anne Hahn retired.  She is here today for perceived imbalance.  She was originally seen for dizziness but has had vertigo once recently but otherwise  that is a rare episode, she was throwing up, she called an ambulance and went to [facility] for 5-6 hours and had a work-up negative CT.  But in the lasy year she has had more balance problems, more wobbly, she was taking yoga fairly regularly and trying to hard to balance, this is new bc in the past she was able to do it. Even on a bicycle she would fall, she would "veer", feels she "veers", she is enrolled in a PT program through [facility] and she does the exercise and have been treating her for balance for the last 3-4 months she has been trying to balance on one leg and she is diligent on practicing and not getting and better. No neck pain but years ago she broke 2 vertebrae in the lower back. No low back pain, no neck pain, she does have should pain and stenosis in her neck. She goes to dr. Luiz Blare at [facility]. She has not had any recent MRI cervical. She has numbness in toes. No4 other focal neurologic deficits, associated symptoms, inciting events or modifiable factors.   I reviewed outside labs, images and notes:  IMPRESSION: This MRI of the brain with and without contrast shows the following: Mild to moderate generalized cortical atrophy, most pronounced in the frontal and parietal lobes. Scattered T2/FLAIR hyperintense foci in the hemispheres most consistent with chronic microvascular ischemic change.  Demyelination is much less likely to have this appearance. No acute findings.  Normal enhancement pattern.    IMPRESSION: This MRI of the cervical spine without contrast shows the following: Adjacent to C4, there is a small T2 hyperintense focus in the posterior spinal cord.  This is nonspecific but could represent a focus of remote demyelination or inflammation. Multilevel degenerative changes as detailed above, mostly mild, leading to borderline spinal stenosis at C4-C5 and mild spinal stenosis at C5-C6 and C6-C7.  There is no spinal cord compression.  No nerve root  compression.  02/26/2021: CT head INDICATION:    dizziness,   COMPARISON:   09/20/2016   TECHNIQUE:    Multiple axial images obtained from the skull base to the vertex without IV contrast  were obtained on  02/26/2021 7:45 PM   FINDINGS:   The ventricles and subarachnoid spaces are prominent consistent with involutional change.   There is decreased attenuation throughout the white matter most suggestive of small vessel ischemic change.    04/01/2019: Ms. Kania is a 78 year old left-handed white female with a history of episodes of dizziness in the past.  The patient claims that she had onset of problems with dizziness in mid September 2020.  The patient woke up to go to the bathroom and felt somewhat staggery and off balance.  The episode lasted only a few moments and then dissipated.  Since that time, she was having daily episodes until  December 2020 where she would have been dizziness that would be worse with standing, but may be persistent as well with sitting or even lying down.  The patient has documented on multiple occasions that her blood pressures are low during the symptomatic timeframes, she has noted blood pressures as low as 70/50.  On one occasion, she had developed severe stomach cramps, she went to the bathroom and then blacked out.  She had several weeks of significant diarrhea, but this has resolved.  The patient is eating and drinking well, she is staying hydrated.  She denies palpitations of the heart or chest pain or shortness of breath.  She denies headaches or vision changes.  She was seen through Dr. Ezzard Standing from ENT, audiometry and VNG testing were unremarkable.  The patient did have an event in 2018 with transient aphasia, a full stroke work-up at that time was unremarkable at [facility].  2D echocardiogram, carotid Doppler study, and MRI of the brain did not show any significant abnormalities.  The patient does have some cortical atrophy and mild small vessel disease.  The patient  reports no focal numbness of the face, arms, or legs.  She denies any weakness, she has had chronic issues with bladder control in the past.  The patient has had 3 episodes lifetime of transient episodes of dizziness, the first was in the 1990s.  The patient had true vertigo about 4 years ago, but she thinks that this episode was quite different from the current episode.  She is having some problems with word finding and naming difficulty that has been present since 2000, but has worsened over the last 2 years.  She is sent to this office for an evaluation.  Past Medical History:  Diagnosis Date   Arthritis    right knee and foot- [facility]    Asthma    treated in 20's and 30's   Cancer (HCC)    skin- squamous basal   Cataract    Complication of anesthesia    very slow to awaken    GERD (gastroesophageal reflux disease)    prn Prilosec   Head injury, closed    10/26/15- years ago - 30ish   Shortness of breath dyspnea    with exertion   Vaso vagal episode    passed out 2 times, close to passing out 3 times   Vertigo    was treated with rehab March and April 2017, does exercises if she begins to have symptoms    Past Surgical History:  Procedure Laterality Date   ABDOMINAL HYSTERECTOMY  1993   menorrhagia.   BREAST ENHANCEMENT SURGERY  1978   CATARACT EXTRACTION, BILATERAL     COLONOSCOPY W/ POLYPECTOMY     COLONOSCOPY WITH PROPOFOL N/A 02/03/2017   Procedure: COLONOSCOPY WITH PROPOFOL;  Surgeon: Jeani Hawking, MD;  Location: [facility];  Service: Endoscopy;  Laterality: N/A;   KNEE ARTHROSCOPY W/ MENISCAL REPAIR Right    right knee replaceent      TOTAL KNEE ARTHROPLASTY Right 11/03/2015   Procedure: RIGHT TOTAL KNEE ARTHROPLASTY;  Surgeon: Jodi Geralds, MD;  Location: [facility] OR;  Service: Orthopedics;  Laterality: Right;   TUBAL LIGATION  1972   VARICOSE VEIN SURGERY  1978    Family History  Problem Relation Age of Onset   Heart disease Mother 65   Cancer Father 18        pancreatic   Ataxia Neg Hx     Social history:  reports that she quit smoking about  32 years ago. Her smoking use included cigarettes. She started smoking about 53 years ago. She has a 20.1 pack-year smoking history. She has never used smokeless tobacco. She reports current alcohol use of about 7.0 standard drinks of alcohol per week. She reports that she does not use drugs.  Medications:  Prior to Admission medications   Medication Sig Start Date End Date Taking? Authorizing Provider  alendronate (FOSAMAX) 70 MG tablet TAKE 1 TABLET EVERY 7 DAYS. TAKE WITH A FULL GLASS OF WATER ON AN EMPTY STOMACH. 03/30/19  Yes Burchette, Elberta Fortis, MD  aspirin EC 81 MG tablet Take 81 mg by mouth daily.   Yes [provider]  atorvastatin (LIPITOR) 40 MG tablet Take 1 tablet (40 mg total) by mouth daily. 08/18/18  Yes Burchette, Elberta Fortis, MD  Azelaic Acid 15 % cream APPLY TO AFFECTED AREA EVERY DAY 06/15/18  Yes [provider]  Calcium Carb-Cholecalciferol (CALCIUM 600 + D PO) Take 2 tablets by mouth daily.   Yes [provider]  calcium-vitamin D (OSCAL WITH D) 500-200 MG-UNIT TABS tablet Take by mouth.   Yes [provider]  cycloSPORINE (RESTASIS) 0.05 % ophthalmic emulsion Place 1 drop into both eyes 2 (two) times daily.    Yes [provider]  Flaxseed, Linseed, (FLAX SEED OIL) 1000 MG CAPS Take 1,000 mg by mouth daily.   Yes [provider]  Ginger, Zingiber officinalis, (GINGER ROOT) 550 MG CAPS Take 550 mg by mouth daily.   Yes [provider]  meloxicam (MOBIC) 15 MG tablet Take 15 mg by mouth daily. 04/07/18  Yes [provider]  Multiple Vitamins-Minerals (ONE DAILY WOMENS 50+ PO) Take 1 tablet by mouth daily.   Yes [provider]  MYRBETRIQ 25 MG TB24 tablet TAKE 1 TABLET DAILY 04/21/18  Yes Burchette, Elberta Fortis, MD  omeprazole (PRILOSEC) 20 MG capsule Take by mouth.   Yes [provider]  POTASSIUM PO Take 1 tablet  by mouth daily.   Yes [provider]  TURMERIC PO Take 400 mg by mouth 2 (two) times daily.    Yes [provider]      Allergies  Allergen Reactions   Adhesive [Tape] Hives    Use paper tape only   Other Itching and Rash    Rubie Maid cooling pads, severe itching    ROS:  Patient complains of symptoms per HPI as well as the following symptoms: imbalance stable . Pertinent negatives and positives per HPI. All others negative  Blood pressure (!) 150/86, pulse (!) 58, height 5\' 8"  (1.727 m), weight 158 lb (71.7 kg).        Physical exam: Exam: Gen: NAD, conversant, well nourised, obese, well groomed                     CV: RRR, no MRG. No Carotid Bruits. No peripheral edema, warm, nontender Eyes: Conjunctivae clear without exudates or hemorrhage  Neuro: Detailed Neurologic Exam  Speech:    Speech is normal; fluent and spontaneous with normal comprehension.  Cognition:    The patient is oriented to person, place, and time;     recent and remote memory intact;     language fluent;     normal attention, concentration,     fund of knowledge Cranial Nerves:    The pupils are equal, round, and reactive to light. . Visual fields are full to finger confrontation. Extraocular movements are intact. Trigeminal sensation is intact and the  muscles of mastication are normal. The face is symmetric. The palate elevates in the midline. Hearing intact. Voice is normal. Shoulder shrug is normal. The tongue has normal motion without fasciculations.   Coordination: nml  Gait: Tandem is stable, slight imbalance.stable  Motor Observation:    No asymmetry, no atrophy, and no involuntary movements noted. Tone:    Normal muscle tone.    Posture:    Posture is normal. normal erect    Strength:    Strength is V/V in the upper and lower limbs.      Sensation: intact to LT, pinprick, temperature, proprioception intact distally in the great toes and negative Romberg.      Reflex Exam:  DTR's:    Deep tendon reflexes in the upper and lower extremities are symmetrical bilaterally.   Toes:    The toes are equiv bilaterally.   Clonus:    Clonus is absent.  Assessment/Plan: This is a 78 year old lovely patient whom we have seen in the past, she saw Dr. Anne Hahn in February 2021 for dizziness, she reports her balance is stable. Her sensory exam is normal with negative Romberg.   Patient has no new symptoms since last exam. 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 Neuro exam stable.   Getting her shoulder repaired needs clearance, have not received the paperwork yet, we will call her orthopedist and I have asked my staff to call the orthopedist office so that we can get her the clearance ASAP, she is cleared from a neurologic standpoint without restrictions. Dr[facility].   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 We reviewed all images past and recent together and reviewed the lesions, stable.  Fatigue: Make f/u Dr. Maple Hudson. Has mild OSA with noctunral hypoxemia. HST 03/27/21- AHI 14.1/ hr, desaturation to 81%, body weight 152 lbs Husband  Mia Peterson our pt, uses [facility] for CPAP. Body weight today-153 lbs Covid vax-4 Phizer Flu vax- had Recommend go and have this completed, decond test offered Check thyroid  Memory is fine. This past year, no new symptoms, no loss of vision, no weakness, numbness, tingling.   Repeat MRI brain and cervical spine WITH contrast end of year - I will put a reminder. Give Xanax at that time, see bacj in Jan 2025  No orders of the  defined types were placed in this encounter.     Naomie Dean, MD  [facility] [address] [place], [place] [address]  Phone [phone] Fax [phone] I spent  over 30 minutes of face-to-face and non-face-to-face time with patient on the  1. Demyelinating disease of central nervous system (HCC)      diagnosis.  This included previsit chart review, lab review, study review, order entry, electronic health record documentation, patient education on the different diagnostic and therapeutic options, counseling and coordination of care, risks and benefits of management, compliance, or risk factor reduction

## 2023-03-13 ENCOUNTER — Telehealth: Payer: Self-pay | Admitting: Internal Medicine

## 2023-03-13 NOTE — Telephone Encounter (Signed)
Fax received from Dr. Jones Broom with [facility] to perform a right reverse total shoulder arthroplasty under choice anesthesia on patient.  Patient needs surgery clearance. Surgery is TBD. Patient was seen on 12/24/22. Office protocol is a risk assessment can be sent to surgeon if patient has been seen in 60 days or less.   Sending to Dr Maple Hudson for risk assessment or recommendations if patient needs to be seen in office prior to surgical procedure.   She is not scheduled to return until 06/24/23   Do you want to clear her based off last visit or get her in sooner?

## 2023-03-14 ENCOUNTER — Other Ambulatory Visit: Payer: Self-pay | Admitting: Orthopedic Surgery

## 2023-03-14 NOTE — Progress Notes (Signed)
Sent message, via epic in basket, requesting orders in epic from surgeon.  

## 2023-03-14 NOTE — Telephone Encounter (Signed)
I have faxed the addended note to Dr. Ave Filter.  Closing encounter

## 2023-03-15 ENCOUNTER — Telehealth: Payer: Self-pay | Admitting: Neurology

## 2023-03-15 NOTE — Telephone Encounter (Signed)
Pod 4 and Debra: patient is having shoulder surgery. We were supposed to get paperwork for clearance. We need this asap her shoulder surgery is in a week. Can we call Dr. Ave Filter at [facility] and get the paperwork? Or look if it is waiting for [place] somewhere in the office?  Stanton Kidney, thank you for working on this; Pod4: when you get it please fill out immediately she is cleared without restrictions thank you  If we get no response or the paperwork from ortho by Tuesday let me know and let patient know as well thank you

## 2023-03-16 NOTE — Progress Notes (Signed)
COVID Vaccine received:  []  No [x]  Yes Date of any COVID positive Test in last 90 days:  PCP - Evelena Peat, MD  medical clearance in 03-04-23 Epic note  Cardiologist - none Neurology- Naomie Dean, MD  clearance in 03-15-2023 phone note Pulmonary- Jetty Duhamel, MD clearance in 12-24-2022 Epic note  Chest x-ray - 02-26-2022 1v     repeat order EKG -  11-07-2022  Epic Stress Test -  ECHO - 09-21-2016  [facility]  [facility] -   PCR screen: [x]  Ordered & Completed []   No Order but Needs PROFEND     []   N/A for this surgery  Surgery Plan:  [x]  Ambulatory   []  Outpatient in bed  []  Admit Anesthesia:    []  General  []  Spinal  [x]   Choice []   MAC  Pacemaker / ICD device [x]  No []  Yes   Spinal Cord Stimulator:[x]  No []  Yes       History of Sleep Apnea? []  No [x]  Yes   CPAP used?- []  No [x]  Yes    Does the patient monitor blood sugar?   [x]  N/A   []  No []  Yes  Patient has: [x]  NO Hx DM   []  Pre-DM   []  DM1  []   DM2  Blood Thinner / Instructions:  none Aspirin Instructions:  ASA 81 mg    Hold x 5-7 days.   ERAS Protocol Ordered: []  No  [x]  Yes PRE-SURGERY [x]  ENSURE  []  G2   Patient is to be NPO after:  0430  Dental hx: []  Dentures:  []  N/A      []  Bridge or Partial:                   []  Loose or Damaged teeth:   Comments:  The patient was given Benzoyl peroxide Gel as ordered. Instruction regarding application starting 2 days prior to surgery was given and patient voiced understanding.   Patient was given the 5 CHG shower / bath instructions for Reverse Shoulder arthroplasty surgery along with 2 bottles of the CHG soap. Patient will start this on: 03-23-23 All questions were asked and answered, Patient voiced understanding of this process.    Activity level: Patient is able / unable to climb a flight of stairs without difficulty; []  No CP  []  No SOB, but would have ___   Patient can / can not perform ADLs without assistance.  Anesthesia review:  MS- likely in remission, desc. TAA  3.3cm, PACs, Hx TIA (notes CEW 2018) OSA- CPAP, GERD, "slow to wake up from anesthesia"  Patient denies shortness of breath, fever, cough and chest pain at PAT appointment.  Patient verbalized understanding and agreement to the Pre-Surgical Instructions that were given to them at this PAT appointment. Patient was also educated of the need to review these PAT instructions again prior to her surgery.I reviewed the appropriate phone numbers to call if they have any and questions or concerns.

## 2023-03-16 NOTE — Patient Instructions (Signed)
SURGICAL WAITING ROOM VISITATION Patients having surgery or a procedure may have no more than 2 support people in the waiting area - these visitors may rotate in the visitor waiting room.   Due to an increase in RSV and influenza rates and associated hospitalizations, children ages 67 and under may not visit patients in [facility] hospitals. If the patient needs to stay at the hospital during part of their recovery, the visitor guidelines for inpatient rooms apply.  PRE-OP VISITATION  Pre-op nurse will coordinate an appropriate time for 1 support person to accompany the patient in pre-op.  This support person may not rotate.  This visitor will be contacted when the time is appropriate for the visitor to come back in the pre-op area.  Please refer to the [facility] website for the visitor guidelines for Inpatients (after your surgery is over and you are in a regular room).  You are not required to quarantine at this time prior to your surgery. However, you must do this: Hand Hygiene often Do NOT share personal items Notify your provider if you are in close contact with someone who has COVID or you develop fever 100.4 or greater, new onset of sneezing, cough, sore throat, shortness of breath or body aches.  If you test positive for Covid or have been in contact with anyone that has tested positive in the last 10 days please notify you surgeon.    Your procedure is scheduled on:  THURSDAY  March 27, 2023  Report to [facility] Main Entrance: Leota Jacobsen entrance where the [employer] is available.   Report to admitting at: 05:15    AM  Call this number if you have any questions or problems the morning of surgery [phone]  Do not eat food after Midnight the night prior to your surgery/procedure.  After Midnight you may have the following liquids until 04:30 AM DAY OF SURGERY  Clear Liquid Diet Water Black Coffee (sugar ok, NO MILK/CREAM OR CREAMERS)  Tea (sugar ok, NO  MILK/CREAM OR CREAMERS) regular and decaf                             Plain Jell-O  with no fruit (NO RED)                                           Fruit ices (not with fruit pulp, NO RED)                                     Popsicles (NO RED)                                                                  Juice: NO CITRUS JUICES: only apple, WHITE grape, WHITE cranberry Sports drinks like Gatorade or Powerade (NO RED)                   The day of surgery:  Drink ONE (1) Pre-Surgery Clear Ensure at   04:30  AM the morning of  surgery. Drink in one sitting. Do not sip.  This drink was given to you during your hospital pre-op appointment visit. Nothing else to drink after completing the Pre-Surgery Clear Ensure : No candy, chewing gum or throat lozenges.    FOLLOW ANY ADDITIONAL PRE OP INSTRUCTIONS YOU RECEIVED FROM YOUR SURGEON'S OFFICE!!!   Oral Hygiene is also important to reduce your risk of infection.        Remember - BRUSH YOUR TEETH THE MORNING OF SURGERY WITH YOUR REGULAR TOOTHPASTE  Do NOT smoke after Midnight the night before surgery.  STOP TAKING all Vitamins, Herbs and supplements 1 week before your surgery.   Stop taking you ASPIRIN one week before your surgery.   Take ONLY these medicines the morning of surgery with A SIP OF WATER: Pantoprazole. You may use your Eye drops if needed.    If You have been diagnosed with Sleep Apnea - Bring CPAP mask and tubing day of surgery. We will provide you with a CPAP machine on the day of your surgery.                   You may not have any metal on your body including hair pins, jewelry, and body piercing  Do not wear make-up, lotions, powders, perfumes or deodorant  Do not wear nail polish including gel and S&S, artificial / acrylic nails, or any other type of covering on natural nails including finger and toenails. If you have artificial nails, gel coating, etc., that needs to be removed by a nail salon, Please have this  removed prior to surgery. Not doing so may mean that your surgery could be cancelled or delayed if the Surgeon or anesthesia staff feels like they are unable to monitor you safely.   Do not shave 48 hours prior to surgery to avoid nicks in your skin which may contribute to postoperative infections.   Contacts, Hearing Aids, dentures or bridgework may not be worn into surgery. DENTURES WILL BE REMOVED PRIOR TO SURGERY PLEASE DO NOT APPLY "Poly grip" OR ADHESIVES!!!  You may bring a small overnight bag with you on the day of surgery, only pack items that are not valuable. [facility] IS NOT RESPONSIBLE   FOR VALUABLES THAT ARE LOST OR STOLEN.   Patients discharged on the day of surgery will not be allowed to drive home.  Someone NEEDS to stay with you for the first 24 hours after anesthesia.  Do not bring your home medications to the hospital. The Pharmacy will dispense medications listed on your medication list to you during your admission in the Hospital.  Special Instructions: Bring a copy of your healthcare power of attorney and living will documents the day of surgery, if you wish to have them scanned into your [facility] Records- EPIC  Please read over the following fact sheets you were given: IF YOU HAVE QUESTIONS ABOUT YOUR PRE-OP INSTRUCTIONS, PLEASE CALL [phone].     Pre-operative 5 CHG Bath Instructions   You can play a key role in reducing the risk of infection after surgery. Your skin needs to be as free of germs as possible. You can reduce the number of germs on your skin by washing with CHG (chlorhexidine gluconate) soap before surgery. CHG is an antiseptic soap that kills germs and continues to kill germs even after washing.   DO NOT use if you have an allergy to chlorhexidine/CHG or antibacterial soaps. If your skin becomes reddened or irritated, stop using the CHG  and notify one of our RNs at [phone]  Please shower with the CHG soap starting 4 days before  surgery using the following schedule: START SHOWERS ON SUNDAY  March 23, 2023                                                                                                                                                                              Please keep in mind the following:  DO NOT shave, including legs and underarms, starting the day of your first shower.   You may shave your face at any point before/day of surgery.   Place clean sheets on your bed the day you start using CHG soap. Use a clean washcloth (not used since being washed) for each shower. DO NOT sleep with pets once you start using the CHG.   CHG Shower Instructions:  If you choose to wash your hair and private area, wash first with your normal shampoo/soap.  After you use shampoo/soap, rinse your hair and body thoroughly to remove shampoo/soap residue.  Turn the water OFF and apply about 3 tablespoons (45 ml) of CHG soap to a CLEAN washcloth.  Apply CHG soap ONLY FROM YOUR NECK DOWN TO YOUR TOES (washing for 3-5 minutes)  DO NOT use CHG soap on face, private areas, open wounds, or sores.  Pay special attention to the area where your surgery is being performed.  If you are having back surgery, having someone wash your back for you may be helpful.  Wait 2 minutes after CHG soap is applied, then you may rinse off the CHG soap.  Pat dry with a clean towel  Put on clean clothes/pajamas   If you choose to wear lotion, please use ONLY the CHG-compatible lotions on the back of this paper.     Additional instructions for the day of surgery: DO NOT APPLY any lotions, deodorants, cologne, or perfumes.   Put on clean/comfortable clothes.  Brush your teeth.  Ask your nurse before applying any prescription medications to the skin.      CHG Compatible Lotions   Aveeno Moisturizing lotion  Cetaphil Moisturizing Cream  Cetaphil Moisturizing Lotion  Clairol Herbal Essence Moisturizing Lotion, Dry Skin  Clairol Herbal  Essence Moisturizing Lotion, Extra Dry Skin  Clairol Herbal Essence Moisturizing Lotion, Normal Skin  Curel Age Defying Therapeutic Moisturizing Lotion with Alpha Hydroxy  Curel Extreme Care Body Lotion  Curel Soothing Hands Moisturizing Hand Lotion  Curel Therapeutic Moisturizing Cream, Fragrance-Free  Curel Therapeutic Moisturizing Lotion, Fragrance-Free  Curel Therapeutic Moisturizing Lotion, Original Formula  Eucerin Daily Replenishing Lotion  Eucerin Dry Skin Therapy Plus Alpha Hydroxy Crme  Eucerin Dry Skin Therapy Plus Alpha Hydroxy Lotion  Eucerin Original  Crme  Eucerin Original Lotion  Eucerin Plus Crme Eucerin Plus Lotion  Eucerin TriLipid Replenishing Lotion  Keri Anti-Bacterial Hand Lotion  Keri Deep Conditioning Original Lotion Dry Skin Formula Softly Scented  Keri Deep Conditioning Original Lotion, Fragrance Free Sensitive Skin Formula  Keri Lotion Fast Absorbing Fragrance Free Sensitive Skin Formula  Keri Lotion Fast Absorbing Softly Scented Dry Skin Formula  Keri Original Lotion  Keri Skin Renewal Lotion Keri Silky Smooth Lotion  Keri Silky Smooth Sensitive Skin Lotion  Nivea Body Creamy Conditioning Oil  Nivea Body Extra Enriched Lotion  Nivea Body Original Lotion  Nivea Body Sheer Moisturizing Lotion Nivea Crme  Nivea Skin Firming Lotion  NutraDerm 30 Skin Lotion  NutraDerm Skin Lotion  NutraDerm Therapeutic Skin Cream  NutraDerm Therapeutic Skin Lotion  ProShield Protective Hand Cream  Provon moisturizing lotion     Preparing for Total Shoulder Arthroplasty ================================================================= Please follow these instructions carefully, in addition to any other special Bathing information that was explained to you at the Presurgical Appointment:  BENZOYL PEROXIDE 5% GEL: Used to kill bacteria on the skin which could cause an infection at the surgery site.   Please do not use if you have an allergy to benzoyl peroxide. If  your skin becomes reddened/irritated stop using the benzoyl peroxide and inform your Doctor.   Starting two days before surgery, apply as follows:  1. Apply benzoyl peroxide gel in the morning and at night. Apply after taking a shower. If you are not taking a shower, clean entire shoulder front, back, and side, along with the armpit with a clean wet washcloth.  2. Place a quarter-sized dollop of the gel on your SHOULDER and rub in thoroughly, making sure to cover the front, back, and side of your shoulder, along with the armpit.   2 Days prior to Surgery    TUESDAY  March 25, 2023 First Application _______ Morning Second Application _______ Night  Day Before Surgery    [facility]  March 26, 2023 First Application______ Morning  On the night before surgery, wash your entire body (except hair, face and private areas) with CHG Soap. THEN, rub in the LAST application of the Benzoyl Peroxide Gel on your shoulder.   3. On the Morning of Surgery wash your BODY AGAIN with CHG Soap (except hair, face and private areas)  4. DO NOT USE THE BENZOYL PEROXIDE GEL ON THE DAY OF YOUR SURGERY       FAILURE TO FOLLOW THESE INSTRUCTIONS MAY RESULT IN THE CANCELLATION OF YOUR SURGERY  PATIENT SIGNATURE_________________________________  NURSE SIGNATURE__________________________________  ________________________________________________________________________      Rogelia Mire    An incentive spirometer is a tool that can help keep your lungs clear and active. This tool measures how well you are filling your lungs with each breath. Taking long deep breaths may help reverse or decrease the chance of developing breathing (pulmonary) problems (especially infection) following: A long period of time when you are unable to move or be active. BEFORE THE PROCEDURE  If the spirometer includes an indicator to show your best effort, your nurse or respiratory therapist will set it to a desired  goal. If possible, sit up straight or lean slightly forward. Try not to slouch. Hold the incentive spirometer in an upright position. INSTRUCTIONS FOR USE  Sit on the edge of your bed if possible, or sit up as far as you can in bed or on a chair. Hold the incentive spirometer in an upright position. Breathe out normally. Place the  mouthpiece in your mouth and seal your lips tightly around it. Breathe in slowly and as deeply as possible, raising the piston or the ball toward the top of the column. Hold your breath for 3-5 seconds or for as long as possible. Allow the piston or ball to fall to the bottom of the column. Remove the mouthpiece from your mouth and breathe out normally. Rest for a few seconds and repeat Steps 1 through 7 at least 10 times every 1-2 hours when you are awake. Take your time and take a few normal breaths between deep breaths. The spirometer may include an indicator to show your best effort. Use the indicator as a goal to work toward during each repetition. After each set of 10 deep breaths, practice coughing to be sure your lungs are clear. If you have an incision (the cut made at the time of surgery), support your incision when coughing by placing a pillow or rolled up towels firmly against it. Once you are able to get out of bed, walk around indoors and cough well. You may stop using the incentive spirometer when instructed by your caregiver.  RISKS AND COMPLICATIONS Take your time so you do not get dizzy or light-headed. If you are in pain, you may need to take or ask for pain medication before doing incentive spirometry. It is harder to take a deep breath if you are having pain. AFTER USE Rest and breathe slowly and easily. It can be helpful to keep track of a log of your progress. Your caregiver can provide you with a simple table to help with this. If you are using the spirometer at home, follow these instructions: SEEK MEDICAL CARE IF:  You are having difficultly  using the spirometer. You have trouble using the spirometer as often as instructed. Your pain medication is not giving enough relief while using the spirometer. You develop fever of 100.5 F (38.1 C) or higher.                                                                                                    SEEK IMMEDIATE MEDICAL CARE IF:  You cough up bloody sputum that had not been present before. You develop fever of 102 F (38.9 C) or greater. You develop worsening pain at or near the incision site. MAKE SURE YOU:  Understand these instructions. Will watch your condition. Will get help right away if you are not doing well or get worse. Document Released: 06/24/2006 Document Revised: 05/06/2011 Document Reviewed: 08/25/2006 [facility] Patient Information 2014 [place], [place].   If you would like to see a video about joint replacement:   [email or web address]

## 2023-03-17 NOTE — Telephone Encounter (Signed)
LMVM for Darel Hong, surgery scheduler, at [facility], please fax clearance form to [place] at [phone] for pts up coming shoulder surgery.  Questions call sandy RN [phone].

## 2023-03-18 ENCOUNTER — Ambulatory Visit (HOSPITAL_COMMUNITY)
Admission: RE | Admit: 2023-03-18 | Discharge: 2023-03-18 | Disposition: A | Discharge status: Home / Self Care | Payer: Medicare Other | Source: Ambulatory Visit | Attending: Orthopedic Surgery | Admitting: Orthopedic Surgery

## 2023-03-18 ENCOUNTER — Other Ambulatory Visit: Payer: Self-pay

## 2023-03-18 ENCOUNTER — Encounter (HOSPITAL_COMMUNITY): Payer: Self-pay

## 2023-03-18 ENCOUNTER — Encounter (HOSPITAL_COMMUNITY)
Admission: RE | Admit: 2023-03-18 | Discharge: 2023-03-18 | Disposition: A | Discharge status: Home / Self Care | Payer: Medicare Other | Source: Ambulatory Visit | Attending: Orthopedic Surgery

## 2023-03-18 VITALS — BP 136/70 | HR 62 | Temp 97.8°F | Resp 14 | Ht 68.0 in | Wt 155.0 lb

## 2023-03-18 DIAGNOSIS — G379 Demyelinating disease of central nervous system, unspecified: Secondary | ICD-10-CM | POA: Diagnosis not present

## 2023-03-18 DIAGNOSIS — Z01818 Encounter for other preprocedural examination: Secondary | ICD-10-CM | POA: Insufficient documentation

## 2023-03-18 DIAGNOSIS — G459 Transient cerebral ischemic attack, unspecified: Secondary | ICD-10-CM | POA: Diagnosis not present

## 2023-03-18 HISTORY — DX: Myoneural disorder, unspecified: G70.9

## 2023-03-18 HISTORY — DX: Cerebral infarction, unspecified: I63.9

## 2023-03-18 LAB — SURGICAL PCR SCREEN
MRSA, PCR: NEGATIVE
Staphylococcus aureus: NEGATIVE

## 2023-03-18 LAB — CBC
HCT: 39.1 % (ref 36.0–46.0)
Hemoglobin: 12.7 g/dL (ref 12.0–15.0)
MCH: 29.7 pg (ref 26.0–34.0)
MCHC: 32.5 g/dL (ref 30.0–36.0)
MCV: 91.4 fL (ref 80.0–100.0)
Platelets: 257 10*3/uL (ref 150–400)
RBC: 4.28 MIL/uL (ref 3.87–5.11)
RDW: 14 % (ref 11.5–15.5)
WBC: 5.9 10*3/uL (ref 4.0–10.5)
nRBC: 0 % (ref 0.0–0.2)

## 2023-03-18 LAB — BASIC METABOLIC PANEL
Anion gap: 9 (ref 5–15)
BUN: 14 mg/dL (ref 8–23)
CO2: 25 mmol/L (ref 22–32)
Calcium: 8.9 mg/dL (ref 8.9–10.3)
Chloride: 102 mmol/L (ref 98–111)
Creatinine, Ser: 0.72 mg/dL (ref 0.44–1.00)
GFR, Estimated: >60 mL/min (ref >60)
Glucose, Bld: 96 mg/dL (ref 70–99)
Potassium: 4.1 mmol/L (ref 3.5–5.1)
Sodium: 136 mmol/L (ref 135–145)

## 2023-03-18 NOTE — Telephone Encounter (Signed)
Received. To Dr. Lucia Gaskins to sign off.

## 2023-03-18 NOTE — Telephone Encounter (Signed)
I called and spoke to JUDY, she will fax to me at [phone]. Clearance Form for shoulder surgery.

## 2023-03-20 NOTE — Telephone Encounter (Addendum)
Clearance request for neurology signed by Dr Lucia Gaskins (indicated low risk) and faxed to [facility]. Fax failed. I have LVM for [facility] @ [employer].  I called the front desk there and obtained alternative fax number [phone]. Received a receipt of confirmation from fax sent to this number. I asked the staff member to let Darel Hong know its being sent to the other number.

## 2023-03-26 NOTE — Anesthesia Preprocedure Evaluation (Signed)
Anesthesia Evaluation  Patient identified by MRN, date of birth, ID band Patient awake    Reviewed: Allergy & Precautions, NPO status , Patient's Chart, lab work & pertinent test results  Airway Mallampati: II  TM Distance: >3 FB Neck ROM: Full    Dental  (+) Caps, Dental Advisory Given   Pulmonary asthma , former smoker   breath sounds clear to auscultation       Cardiovascular (-) hypertension(-) angina  Rhythm:Regular Rate:Normal  '18 ECHO: EF 60-65%, normal LVF, Grade 1 DD, mild-mod TR, mild AI   Neuro/Psych TIA   GI/Hepatic Neg liver ROS,GERD  Medicated and Controlled,,  Endo/Other  negative endocrine ROS    Renal/GU negative Renal ROS     Musculoskeletal   Abdominal   Peds  Hematology negative hematology ROS (+)   Anesthesia Other Findings   Reproductive/Obstetrics                             Anesthesia Physical Anesthesia Plan  ASA: 3  Anesthesia Plan: General   Post-op Pain Management: Regional block* and Tylenol PO (pre-op)*   Induction: Intravenous  PONV Risk Score and Plan: 3 and Ondansetron, Dexamethasone and Treatment may vary due to age or medical condition  Airway Management Planned: Oral ETT  Additional Equipment: None  Intra-op Plan:   Post-operative Plan: Extubation in OR  Informed Consent: I have reviewed the patients History and Physical, chart, labs and discussed the procedure including the risks, benefits and alternatives for the proposed anesthesia with the patient or authorized representative who has indicated his/her understanding and acceptance.     Dental advisory given  Plan Discussed with: CRNA and Surgeon  Anesthesia Plan Comments: (Plan routine monitors, GETA with interscalene block for post op analgesia)        Anesthesia Quick Evaluation

## 2023-03-27 ENCOUNTER — Encounter (HOSPITAL_COMMUNITY): Payer: Self-pay | Admitting: Orthopedic Surgery

## 2023-03-27 ENCOUNTER — Encounter (HOSPITAL_COMMUNITY)
Admission: RE | Disposition: A | Discharge status: Home / Self Care | Payer: Self-pay | Source: Ambulatory Visit | Attending: Orthopedic Surgery

## 2023-03-27 ENCOUNTER — Ambulatory Visit (HOSPITAL_COMMUNITY): Payer: Self-pay | Admitting: Certified Registered"

## 2023-03-27 ENCOUNTER — Ambulatory Visit (HOSPITAL_COMMUNITY): Payer: Medicare Other | Admitting: Physician Assistant

## 2023-03-27 ENCOUNTER — Other Ambulatory Visit: Payer: Self-pay

## 2023-03-27 ENCOUNTER — Ambulatory Visit (HOSPITAL_COMMUNITY)
Admission: RE | Admit: 2023-03-27 | Discharge: 2023-03-27 | Disposition: A | Discharge status: Home / Self Care | Payer: Medicare Other | Source: Ambulatory Visit | Attending: Orthopedic Surgery | Admitting: Orthopedic Surgery

## 2023-03-27 DIAGNOSIS — M25811 Other specified joint disorders, right shoulder: Secondary | ICD-10-CM | POA: Diagnosis not present

## 2023-03-27 DIAGNOSIS — M19011 Primary osteoarthritis, right shoulder: Secondary | ICD-10-CM | POA: Diagnosis present

## 2023-03-27 DIAGNOSIS — K219 Gastro-esophageal reflux disease without esophagitis: Secondary | ICD-10-CM | POA: Diagnosis not present

## 2023-03-27 DIAGNOSIS — Z87891 Personal history of nicotine dependence: Secondary | ICD-10-CM | POA: Insufficient documentation

## 2023-03-27 DIAGNOSIS — J45909 Unspecified asthma, uncomplicated: Secondary | ICD-10-CM | POA: Diagnosis not present

## 2023-03-27 DIAGNOSIS — Z8673 Personal history of transient ischemic attack (TIA), and cerebral infarction without residual deficits: Secondary | ICD-10-CM | POA: Diagnosis not present

## 2023-03-27 HISTORY — PX: REVERSE SHOULDER ARTHROPLASTY: SHX5054

## 2023-03-27 SURGERY — ARTHROPLASTY, SHOULDER, TOTAL, REVERSE
Anesthesia: General | Site: Shoulder | Laterality: Right

## 2023-03-27 MED ORDER — 0.9 % SODIUM CHLORIDE (POUR BTL) OPTIME
TOPICAL | Status: DC | PRN
  Administered 2023-03-27: 1000 mL

## 2023-03-27 MED ORDER — LIDOCAINE HCL (PF) 2 % IJ SOLN
INTRAMUSCULAR | Status: AC
  Filled 2023-03-27: qty 5

## 2023-03-27 MED ORDER — TIZANIDINE HCL 2 MG PO TABS: 2.0000 mg | ORAL_TABLET | Freq: Three times a day (TID) | ORAL | 0 refills | Status: DC | PRN

## 2023-03-27 MED ORDER — ONDANSETRON HCL 4 MG/2ML IJ SOLN
INTRAMUSCULAR | Status: AC
  Filled 2023-03-27: qty 2

## 2023-03-27 MED ORDER — ROCURONIUM BROMIDE 10 MG/ML (PF) SYRINGE
PREFILLED_SYRINGE | INTRAVENOUS | Status: AC
  Filled 2023-03-27: qty 10

## 2023-03-27 MED ORDER — ONDANSETRON HCL 4 MG/2ML IJ SOLN
INTRAMUSCULAR | Status: DC | PRN
  Administered 2023-03-27 (×2): 4 mg via INTRAVENOUS

## 2023-03-27 MED ORDER — WATER FOR IRRIGATION, STERILE IR SOLN
Status: DC | PRN
  Administered 2023-03-27: 1000 mL

## 2023-03-27 MED ORDER — PROPOFOL 10 MG/ML IV BOLUS
INTRAVENOUS | Status: AC
  Filled 2023-03-27: qty 20

## 2023-03-27 MED ORDER — OXYCODONE HCL 5 MG/5ML PO SOLN: 5.0000 mg | Freq: Once | ORAL | Status: DC | PRN

## 2023-03-27 MED ORDER — MIDAZOLAM HCL 2 MG/2ML IJ SOLN
INTRAMUSCULAR | Status: AC
  Filled 2023-03-27: qty 2

## 2023-03-27 MED ORDER — FENTANYL CITRATE (PF) 100 MCG/2ML IJ SOLN
INTRAMUSCULAR | Status: DC | PRN
  Administered 2023-03-27: 50 ug via INTRAVENOUS

## 2023-03-27 MED ORDER — ROCURONIUM BROMIDE 10 MG/ML (PF) SYRINGE
PREFILLED_SYRINGE | INTRAVENOUS | Status: DC | PRN
  Administered 2023-03-27: 40 mg via INTRAVENOUS

## 2023-03-27 MED ORDER — ORAL CARE MOUTH RINSE: 15.0000 mL | Freq: Once | OROMUCOSAL | Status: AC

## 2023-03-27 MED ORDER — MIDAZOLAM HCL 2 MG/2ML IJ SOLN
0.5000 mg | Freq: Once | INTRAMUSCULAR | Status: DC | PRN
Start: 2023-03-27 — End: 2023-03-27

## 2023-03-27 MED ORDER — HYDROMORPHONE HCL 1 MG/ML IJ SOLN: 0.2500 mg | INTRAMUSCULAR | Status: DC | PRN

## 2023-03-27 MED ORDER — SUGAMMADEX SODIUM 200 MG/2ML IV SOLN
INTRAVENOUS | Status: DC | PRN
  Administered 2023-03-27: 140 mg via INTRAVENOUS

## 2023-03-27 MED ORDER — OXYCODONE HCL 5 MG PO TABS: 5.0000 mg | ORAL_TABLET | ORAL | 0 refills | Status: DC | PRN

## 2023-03-27 MED ORDER — BUPIVACAINE LIPOSOME 1.3 % IJ SUSP
INTRAMUSCULAR | Status: DC | PRN
  Administered 2023-03-27: 10 mL via PERINEURAL

## 2023-03-27 MED ORDER — SODIUM CHLORIDE 0.9 % IR SOLN
Status: DC | PRN
  Administered 2023-03-27: 1000 mL

## 2023-03-27 MED ORDER — EPHEDRINE SULFATE-NACL 50-0.9 MG/10ML-% IV SOSY
PREFILLED_SYRINGE | INTRAVENOUS | Status: DC | PRN
  Administered 2023-03-27: 5 mg via INTRAVENOUS
  Administered 2023-03-27 (×2): 2.5 mg via INTRAVENOUS

## 2023-03-27 MED ORDER — CEFAZOLIN SODIUM-DEXTROSE 2-4 GM/100ML-% IV SOLN
2.0000 g | INTRAVENOUS | Status: AC
Start: 2023-03-27 — End: 2023-03-27
  Administered 2023-03-27: 2 g via INTRAVENOUS
  Filled 2023-03-27: qty 100

## 2023-03-27 MED ORDER — TRANEXAMIC ACID-NACL 1000-0.7 MG/100ML-% IV SOLN
1000.0000 mg | INTRAVENOUS | Status: AC
  Administered 2023-03-27: 1000 mg via INTRAVENOUS
  Filled 2023-03-27: qty 100

## 2023-03-27 MED ORDER — DEXAMETHASONE SODIUM PHOSPHATE 10 MG/ML IJ SOLN
INTRAMUSCULAR | Status: AC
  Filled 2023-03-27: qty 1

## 2023-03-27 MED ORDER — PHENYLEPHRINE HCL-NACL 20-0.9 MG/250ML-% IV SOLN
INTRAVENOUS | Status: DC | PRN
  Administered 2023-03-27: 20 ug/min via INTRAVENOUS

## 2023-03-27 MED ORDER — PROPOFOL 10 MG/ML IV BOLUS
INTRAVENOUS | Status: DC | PRN
  Administered 2023-03-27: 80 mg via INTRAVENOUS

## 2023-03-27 MED ORDER — SUCCINYLCHOLINE CHLORIDE 200 MG/10ML IV SOSY
PREFILLED_SYRINGE | INTRAVENOUS | Status: DC | PRN
  Administered 2023-03-27: 110 mg via INTRAVENOUS

## 2023-03-27 MED ORDER — CHLORHEXIDINE GLUCONATE 0.12 % MT SOLN
15.0000 mL | Freq: Once | OROMUCOSAL | Status: AC
Start: 2023-03-27 — End: 2023-03-27
  Administered 2023-03-27: 15 mL via OROMUCOSAL

## 2023-03-27 MED ORDER — LIDOCAINE 2% (20 MG/ML) 5 ML SYRINGE
INTRAMUSCULAR | Status: DC | PRN
  Administered 2023-03-27: 20 mg via INTRAVENOUS

## 2023-03-27 MED ORDER — MEPERIDINE HCL 50 MG/ML IJ SOLN: 6.2500 mg | INTRAMUSCULAR | Status: DC | PRN

## 2023-03-27 MED ORDER — FENTANYL CITRATE (PF) 100 MCG/2ML IJ SOLN
INTRAMUSCULAR | Status: AC
  Filled 2023-03-27: qty 2

## 2023-03-27 MED ORDER — ACETAMINOPHEN 500 MG PO TABS
1000.0000 mg | ORAL_TABLET | Freq: Once | ORAL | Status: AC
  Administered 2023-03-27: 1000 mg via ORAL
  Filled 2023-03-27: qty 2

## 2023-03-27 MED ORDER — BUPIVACAINE-EPINEPHRINE (PF) 0.5% -1:200000 IJ SOLN
INTRAMUSCULAR | Status: DC | PRN
  Administered 2023-03-27: 10 mL via PERINEURAL

## 2023-03-27 MED ORDER — LACTATED RINGERS IV SOLN: INTRAVENOUS | Status: DC

## 2023-03-27 MED ORDER — DEXAMETHASONE SODIUM PHOSPHATE 10 MG/ML IJ SOLN
INTRAMUSCULAR | Status: DC | PRN
  Administered 2023-03-27: 5 mg via INTRAVENOUS

## 2023-03-27 MED ORDER — SUCCINYLCHOLINE CHLORIDE 200 MG/10ML IV SOSY
PREFILLED_SYRINGE | INTRAVENOUS | Status: AC
  Filled 2023-03-27: qty 10

## 2023-03-27 MED ORDER — OXYCODONE HCL 5 MG PO TABS: 5.0000 mg | ORAL_TABLET | Freq: Once | ORAL | Status: DC | PRN

## 2023-03-27 SURGICAL SUPPLY — 68 items
BAG COUNTER SPONGE SURGICOUNT (BAG) IMPLANT
BAG ZIPLOCK 12X15 (MISCELLANEOUS) ×1 IMPLANT
BASEPLATE P2 COATD GLND 6.5X30 (Shoulder) IMPLANT
BIT DRILL 1.6MX128 (BIT) IMPLANT
BIT DRILL 2.5 DIA 127 CALI (BIT) IMPLANT
BIT DRILL 4 DIA CALIBRATED (BIT) IMPLANT
BLADE SAW SGTL 73X25 THK (BLADE) ×1 IMPLANT
BOOTIES KNEE HIGH SLOAN (MISCELLANEOUS) ×2 IMPLANT
COOLER ICEMAN CLASSIC (MISCELLANEOUS) ×1 IMPLANT
COVER BACK TABLE 60X90IN (DRAPES) ×1 IMPLANT
COVER SURGICAL LIGHT HANDLE (MISCELLANEOUS) ×1 IMPLANT
DRAPE INCISE IOBAN 66X45 STRL (DRAPES) ×1 IMPLANT
DRAPE POUCH INSTRU U-SHP 10X18 (DRAPES) ×1 IMPLANT
DRAPE SHEET LG 3/4 BI-LAMINATE (DRAPES) ×1 IMPLANT
DRAPE SURG 17X11 SM STRL (DRAPES) ×1 IMPLANT
DRAPE SURG ORHT 6 SPLT 77X108 (DRAPES) ×2 IMPLANT
DRAPE TOP 10253 STERILE (DRAPES) ×1 IMPLANT
DRAPE U-SHAPE 47X51 STRL (DRAPES) ×1 IMPLANT
DRSG AQUACEL AG ADV 3.5X 6 (GAUZE/BANDAGES/DRESSINGS) ×1 IMPLANT
DURAPREP 26ML APPLICATOR (WOUND CARE) ×2 IMPLANT
ELECT BLADE TIP CTD 4 INCH (ELECTRODE) ×1 IMPLANT
ELECT REM PT RETURN 15FT ADLT (MISCELLANEOUS) ×1 IMPLANT
FACESHIELD WRAPAROUND (MASK) ×1
FACESHIELD WRAPAROUND OR TEAM (MASK) ×1 IMPLANT
GLOVE BIO SURGEON STRL SZ7.5 (GLOVE) ×1 IMPLANT
GLOVE BIOGEL PI IND STRL 6.5 (GLOVE) ×1 IMPLANT
GLOVE BIOGEL PI IND STRL 8 (GLOVE) ×1 IMPLANT
GLOVE SURG SS PI 6.5 STRL IVOR (GLOVE) ×1 IMPLANT
GOWN STRL REUS W/ TWL LRG LVL3 (GOWN DISPOSABLE) ×1 IMPLANT
GOWN STRL REUS W/ TWL XL LVL3 (GOWN DISPOSABLE) ×1 IMPLANT
HOOD PEEL AWAY T7 (MISCELLANEOUS) ×3 IMPLANT
INSERT SMALL SOCKET 32MM NEU (Insert) IMPLANT
KIT BASIN OR (CUSTOM PROCEDURE TRAY) ×1 IMPLANT
KIT TURNOVER KIT A (KITS) IMPLANT
MANIFOLD NEPTUNE II (INSTRUMENTS) ×1 IMPLANT
NDL TROCAR POINT SZ 2 1/2 (NEEDLE) IMPLANT
NEEDLE TROCAR POINT SZ 2 1/2 (NEEDLE)
NS IRRIG 1000ML POUR BTL (IV SOLUTION) ×1 IMPLANT
P2 COATDE GLNOID BSEPLT 6.5X30 (Shoulder) ×1 IMPLANT
PACK SHOULDER (CUSTOM PROCEDURE TRAY) ×1 IMPLANT
PAD COLD SHLDR WRAP-ON (PAD) ×1 IMPLANT
PROTECTOR NERVE ULNAR (MISCELLANEOUS) IMPLANT
RESTRAINT HEAD UNIVERSAL NS (MISCELLANEOUS) ×1 IMPLANT
RETRIEVER SUT HEWSON (MISCELLANEOUS) IMPLANT
SCREW BONE LOCKING RSP 5.0X14 (Screw) ×2 IMPLANT
SCREW BONE LOCKING RSP 5.0X30 (Screw) ×2 IMPLANT
SCREW BONE RSP LOCK 5X14 (Screw) IMPLANT
SCREW BONE RSP LOCK 5X30 (Screw) IMPLANT
SCREW RETAIN W/HEAD 4MM OFFSET (Shoulder) IMPLANT
SET HNDPC FAN SPRY TIP SCT (DISPOSABLE) ×1 IMPLANT
SLING ARM IMMOBILIZER LRG (SOFTGOODS) IMPLANT
SLING ARM IMMOBILIZER MED (SOFTGOODS) IMPLANT
STEM HUMERAL SM SHELL 12X48 (Stem) IMPLANT
STEM HUMERAL SMALL SHELL 12X48 (Stem) ×1 IMPLANT
STRIP CLOSURE SKIN 1/2X4 (GAUZE/BANDAGES/DRESSINGS) ×1 IMPLANT
SUCTION TUBE FRAZIER 10FR DISP (SUCTIONS) IMPLANT
SUPPORT WRAP ARM LG (MISCELLANEOUS) ×1 IMPLANT
SUT ETHIBOND 2 V 37 (SUTURE) IMPLANT
SUT ETHIBOND NAB CT1 #1 30IN (SUTURE) IMPLANT
SUT FIBERWIRE #2 38 REV NDL BL (SUTURE)
SUT MNCRL AB 4-0 PS2 18 (SUTURE) ×1 IMPLANT
SUT VIC AB 2-0 CT1 TAPERPNT 27 (SUTURE) ×2 IMPLANT
SUTURE FIBERWR#2 38 REV NDL BL (SUTURE) IMPLANT
TAP SURG THRD DJ 6.5 (ORTHOPEDIC DISPOSABLE SUPPLIES) IMPLANT
TAPE LABRALWHITE 1.5X36 (TAPE) IMPLANT
TAPE SUT LABRALTAP WHT/BLK (SUTURE) IMPLANT
TOWEL OR 17X26 10 PK STRL BLUE (TOWEL DISPOSABLE) ×1 IMPLANT
WATER STERILE IRR 1000ML POUR (IV SOLUTION) ×1 IMPLANT

## 2023-03-27 NOTE — Transfer of Care (Signed)
Immediate Anesthesia Transfer of Care Note  Patient: TEMEKA PORE  Procedure(s) Performed: REVERSE SHOULDER ARTHROPLASTY (Right: Shoulder)  Patient Location: PACU  Anesthesia Type:General  Level of Consciousness: awake, alert , and patient cooperative  Airway & Oxygen Therapy: Patient Spontanous Breathing and Patient connected to face mask oxygen  Post-op Assessment: Report given to RN and Post -op Vital signs reviewed and stable  Post vital signs: Reviewed and stable  Last Vitals:  Vitals Value Taken Time  BP 149/81 03/27/23 0904  Temp    Pulse 62 03/27/23 0904  Resp 20 03/27/23 0904  SpO2 100 % 03/27/23 0904  Vitals shown include unfiled device data.  Last Pain:  Vitals:   03/27/23 0608  TempSrc: Oral  PainSc:          Complications: No notable events documented.

## 2023-03-27 NOTE — Anesthesia Postprocedure Evaluation (Signed)
Anesthesia Post Note  Patient: Mia Peterson  Procedure(s) Performed: REVERSE SHOULDER ARTHROPLASTY (Right: Shoulder)     Patient location during evaluation: PACU Anesthesia Type: General Level of consciousness: awake and alert, patient cooperative and oriented Pain management: pain level controlled Vital Signs Assessment: post-procedure vital signs reviewed and stable Respiratory status: spontaneous breathing, nonlabored ventilation and respiratory function stable Cardiovascular status: blood pressure returned to baseline and stable Postop Assessment: no apparent nausea or vomiting Anesthetic complications: no   No notable events documented.  Last Vitals:  Vitals:   03/27/23 0904 03/27/23 0915  BP: (!) 149/81 137/75  Pulse: 61 63  Resp: 18 15  Temp: 36.6 C   SpO2: 100% 96%    Last Pain:  Vitals:   03/27/23 0915  TempSrc:   PainSc: 0-No pain                 Misha Antonini,E. Angella Montas

## 2023-03-27 NOTE — Anesthesia Procedure Notes (Addendum)
Procedure Name: Intubation Date/Time: 03/27/2023 7:32 AM  Performed by: Sindy Guadeloupe, CRNAPre-anesthesia Checklist: Patient identified, Emergency Drugs available, Suction available and Patient being monitored Patient Re-evaluated:Patient Re-evaluated prior to induction Oxygen Delivery Method: Circle system utilized Preoxygenation: Pre-oxygenation with 100% oxygen Induction Type: IV induction, Rapid sequence and Cricoid Pressure applied Laryngoscope Size: Mac and 4 Grade View: Grade I Tube type: Oral Tube size: 7.0 mm Number of attempts: 1 Airway Equipment and Method: Stylet and Oral airway Placement Confirmation: ETT inserted through vocal cords under direct vision, positive ETCO2 and breath sounds checked- equal and bilateral Secured at: 23 cm Tube secured with: Tape Dental Injury: Teeth and Oropharynx as per pre-operative assessment

## 2023-03-27 NOTE — Discharge Instructions (Signed)
Discharge Instructions after Reverse Total Shoulder Arthroplasty   A sling has been provided for you. You are to wear this at all times (except for bathing and dressing), until your first post operative visit with Dr. Ave Filter. Please also wear while sleeping at night. While you bath and dress, let the arm/elbow extend straight down to stretch your elbow. Wiggle your fingers and pump your first while your in the sling to prevent hand swelling. Use ice on the shoulder intermittently over the first 48 hours after surgery. Continue to use ice or and ice machine as needed after 48 hours for pain control/swelling.  Pain medicine has been prescribed for you.  Use your medicine liberally over the first 48 hours, and then you can begin to taper your use. You may take Extra Strength Tylenol or Tylenol only in place of the pain pills. DO NOT take ANY nonsteroidal anti-inflammatory pain medications: Advil, Motrin, Ibuprofen, Aleve, Naproxen or Naprosyn.  Take one aspirin a day for 2 weeks after surgery, unless you have an aspirin sensitivity/allergy or asthma.  Leave your dressing on until your first follow up visit.  You may shower with the dressing.  Hold your arm as if you still have your sling on while you shower. Simply allow the water to wash over the site and then pat dry. Make sure your axilla (armpit) is completely dry after showering.    Please call [phone] during normal business hours or [phone] after hours for any problems. Including the following:  - excessive redness of the incisions - drainage for more than 4 days - fever of more than 101.5 F  *Please note that pain medications will not be refilled after hours or on weekends.    Dental Antibiotics:  In most cases prophylactic antibiotics for Dental procdeures after total joint surgery are not necessary.  Exceptions are as follows:  1. History of prior total joint infection  2. Severely immunocompromised (Organ Transplant,  cancer chemotherapy, Rheumatoid biologic meds such as Humera)  3. Poorly controlled diabetes (A1C &gt; 8.0, blood glucose over 200)  If you have one of these conditions, contact your surgeon for an antibiotic prescription, prior to your dental procedure.

## 2023-03-27 NOTE — H&P (Signed)
Mia Peterson is an 78 y.o. female.   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  Pain interferes with sleep and quality of life.   Past Medical History:  Diagnosis Date   Arthritis    right knee and foot- [facility]    Asthma    treated in 20's and 30's   Cancer (HCC)    skin- squamous basal   Cataract    Complication of anesthesia    very slow to awaken    GERD (gastroesophageal reflux disease)    prn Prilosec   Head injury, closed    10/26/15- years ago - 30ish   Neuromuscular disorder (HCC)    MS in remission   Shortness of breath dyspnea    with exertion   Stroke (HCC)    hx TIA   Vaso vagal episode    passed out 2 times, close to passing out 3 times   Vertigo    was treated with rehab March and April 2017, does exercises if she begins to have symptoms    Past Surgical History:  Procedure Laterality Date   ABDOMINAL HYSTERECTOMY  1993   menorrhagia.   BREAST ENHANCEMENT SURGERY  1978   CATARACT EXTRACTION, BILATERAL     COLONOSCOPY W/ POLYPECTOMY     COLONOSCOPY WITH PROPOFOL N/A 02/03/2017   Procedure: COLONOSCOPY WITH PROPOFOL;  Surgeon: Jeani Hawking, MD;  Location: [facility];  Service: Endoscopy;  Laterality: N/A;   KNEE ARTHROSCOPY W/ MENISCAL REPAIR Right    TOTAL KNEE ARTHROPLASTY Right 11/03/2015   Procedure: RIGHT TOTAL KNEE ARTHROPLASTY;  Surgeon: Jodi Geralds, MD;  Location: [facility];  Service: Orthopedics;  Laterality: Right;   TUBAL LIGATION  1972   VARICOSE VEIN SURGERY  1978    Family History  Problem Relation Age of Onset   Heart disease Mother 3   Cancer Father 33       pancreatic   Ataxia Neg Hx    Social History:  reports that she quit smoking about 33 years ago. Her smoking use included cigarettes. She started smoking about 53 years ago. She has a 20.1 pack-year smoking history. She has never used smokeless  tobacco. She reports current alcohol use of about 7.0 standard drinks of alcohol per week. She reports that she does not use drugs.  Allergies:  Allergies  Allergen Reactions   Adhesive [Tape] Hives    Use paper tape only   Other Itching and Rash    Rubie Maid cooling pads, severe itching    Medications Prior to Admission  Medication Sig Dispense Refill   aspirin EC 81 MG tablet Take 81 mg by mouth daily.     atorvastatin (LIPITOR) 40 MG tablet Take 1 tablet (40 mg total) by mouth daily. 90 tablet 3   calcium-vitamin D (OSCAL WITH D) 500-200 MG-UNIT TABS tablet Take 1 tablet by mouth daily.     CELEBREX 200 MG capsule Take 200 mg by mouth 2 (two) times daily as needed for moderate pain (pain score 4-6).     cycloSPORINE (RESTASIS) 0.05 % ophthalmic emulsion Place 1 drop into both eyes 2 (two) times daily.     Flaxseed, Linseed, (FLAX SEED OIL) 1000 MG CAPS Take 1,000 mg by mouth daily.     Ginger, Zingiber officinalis, (GINGER ROOT) 550 MG CAPS Take 550 mg by mouth daily.     Multiple Vitamins-Minerals (ONE DAILY WOMENS 50+ PO)  Take 1 tablet by mouth daily.     oxybutynin (DITROPAN-XL) 5 MG 24 hr tablet Take 5 mg by mouth at bedtime.     pantoprazole (PROTONIX) 40 MG tablet Take 1 tablet (40 mg total) by mouth daily. 90 tablet 3   POTASSIUM PO Take 1 tablet by mouth daily.     tretinoin (RETIN-A) 0.025 % cream Apply 1 Application topically at bedtime as needed (acne).     TURMERIC PO Take 400 mg by mouth 2 (two) times daily.       No results found for this or any previous visit (from the past 48 hours). No results found.  Review of Systems  All other systems reviewed and are negative.   Blood pressure (!) 156/82, pulse 68, temperature 99 F (37.2 C), temperature source Oral, resp. rate 16, height 5\' 8"  (1.727 m), weight 70.3 kg, SpO2 98%. Physical Exam HENT:     Head: Atraumatic.  Eyes:     Extraocular Movements: Extraocular movements intact.  Cardiovascular:     Pulses:  Normal pulses.  Pulmonary:     Effort: Pulmonary effort is normal.  Musculoskeletal:     Comments: RUE pain with limited ROM  Neurological:     Mental Status: She is alert.      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  Pain interferes with sleep and quality of life.  Plan R shoulder reverse TSA Risks / benefits of surgery discussed Consent on chart  NPO for OR Preop antibiotics   Glennon Hamilton, MD 03/27/2023, 6:32 AM

## 2023-03-27 NOTE — Op Note (Signed)
Procedure(s): REVERSE SHOULDER ARTHROPLASTY Procedure Note  Mia Peterson female 78 y.o. 03/27/2023  Preoperative diagnosis: Right shoulder end-stage osteoarthritis with associated rotator cuff disease  Postoperative diagnosis: Same  Procedure(s) and Anesthesia Type:    * REVERSE SHOULDER ARTHROPLASTY - Choice   Indications:  78 y.o. female  With endstage right shoulder arthritis with rotator cuff disease. Pain and dysfunction interfered with quality of life and nonoperative treatment with activity modification, NSAIDS and injections failed.     Surgeon: Glennon Hamilton   Assistants: Fredia Sorrow PA-C Amber was present and scrubbed throughout the procedure and was essential in positioning, retraction, exposure, and closure)  Anesthesia: General endotracheal anesthesia with preoperative interscalene block given by the attending anesthesiologist    Procedure Detail  REVERSE SHOULDER ARTHROPLASTY   Estimated Blood Loss:  less than 100 mL         Drains: none  Blood Given: none          Specimens: none        Complications:  * No complications entered in OR log *         Disposition: PACU - hemodynamically stable.         Condition: stable      OPERATIVE FINDINGS:  A DJO Altivate pressfit reverse total shoulder arthroplasty was placed with a  size 12 stem, a 32-4 glenosphere, and a standard-mm poly insert. The base plate  fixation was excellent.  PROCEDURE: The patient was identified in the preoperative holding area  where I personally marked the operative site after verifying site, side,  and procedure with the patient. An interscalene block given by  the attending anesthesiologist in the holding area and the patient was taken back to the operating room where all extremities were  carefully padded in position after general anesthesia was induced. She  was placed in a beach-chair position and the operative upper extremity was  prepped and draped in a  standard sterile fashion. An approximately 10-  cm incision was made from the tip of the coracoid process to the center  point of the humerus at the level of the axilla. Dissection was carried  down through subcutaneous tissues to the level of the cephalic vein  which was taken laterally with the deltoid. The pectoralis major was  retracted medially. The subdeltoid space was developed and the lateral  edge of the conjoined tendon was identified. The undersurface of  conjoined tendon was palpated and the musculocutaneous nerve was not in  the field. Retractor was placed underneath the conjoined and second  retractor was placed lateral into the deltoid. The circumflex humeral  artery and vessels were identified and clamped and coagulated. The  biceps tendon was tenodesed to the upper border of the pectoralis major.  The subscapularis was taken down as a peel.  The  joint was then gently externally rotated while the capsule was released  from the humeral neck around to just beyond the 6 o'clock position. At  this point, the joint was dislocated and the humeral head was presented  into the wound. The excessive osteophyte formation was removed with a  large rongeur.  The cutting guide was used to make the appropriate  head cut and the head was saved for potentially bone grafting.  The glenoid was exposed with the arm in an  abducted extended position. The anterior and posterior labrum were  completely excised and the capsule was released circumferentially to  allow for exposure of the glenoid for preparation. The  2.5 mm drill was  placed using the guide in 5-10 inferior angulation and the tap was then advanced in the same hole. Small and large reamers were then used. The tap was then removed and the Metaglene was then screwed in with excellent purchase.  The peripheral guide was then used to drilled measured and filled peripheral locking screws. The size 32-4 glenosphere was then impacted on the  [facility] taper and the central screw was placed. The humerus was then again exposed and the diaphyseal reamers were used followed by the metaphyseal reamers. The final broach was left in place in the proximal trial was placed. The joint was reduced and with this implant it was felt that soft tissue tensioning was appropriate with excellent stability and excellent range of motion. Therefore, final humeral stem was placed press-fit with bone graft.  And then the trial polyethylene inserts were tested again and the above implant was felt to be the most appropriate for final insertion. The joint was reduced taken through full range of motion and felt to be stable. Soft tissue tension was appropriate.  The joint was then copiously irrigated with pulse  lavage and the wound was then closed. The subscapularis was repaired loosely with one #2 FiberWire through bone tunnel.  Skin was closed with 2-0 Vicryl in a deep dermal layer and 4-0  Monocryl for skin closure. Steri-Strips were applied. Sterile  dressings were then applied as well as a sling. The patient was allowed  to awaken from general anesthesia, transferred to stretcher, and taken  to recovery room in stable condition.   POSTOPERATIVE PLAN: The patient will be observed in the recovery room and if her pain is well-controlled with regional anesthesia and she is hemodynamically stable she can be discharged home today with family.

## 2023-03-27 NOTE — Anesthesia Procedure Notes (Signed)
Anesthesia Regional Block: Interscalene brachial plexus block   Pre-Anesthetic Checklist: , timeout performed,  Correct Patient, Correct Site, Correct Laterality,  Correct Procedure, Correct Position, site marked,  Risks and benefits discussed,  Surgical consent,  Pre-op evaluation,  At surgeon's request and post-op pain management  Laterality: Right and Upper  Prep: chloraprep       Needles:  Injection technique: Single-shot  Needle Type: Echogenic Needle     Needle Length: 9cm  Needle Gauge: 21     Additional Needles:   Procedures:,,,, ultrasound used (permanent image in chart),,    Narrative:  Start time: 03/27/2023 6:44 AM End time: 03/27/2023 6:51 AM Injection made incrementally with aspirations every 5 mL.  Performed by: Personally  Anesthesiologist: Jairo Ben, MD  Additional Notes: Pt identified in Holding room.  Monitors applied. Working IV access confirmed. Timeout, Sterile prep R clavicle and neck.  #21ga ECHOgenic Arrow block needle to interscalene brachial plexus with US guidance.  10cc 0.5% Bupivacaine 1:200k epi, Exparel injected incrementally after negative test dose.  Patient asymptomatic, VSS, no heme aspirated, tolerated well.   Sandford Craze, MD

## 2023-03-27 NOTE — Evaluation (Signed)
Occupational Therapy Evaluation and Discharge Patient Details Name: Mia Peterson MRN: [id number] DOB: Sep 13, 1945 Today's Date: 03/27/2023   History of Present Illness Pt is a 78 yo female s/p right reverse total surgery due to endstage shoulder arthritis.   Clinical Impression   This 78 yo female admitted and underwent above presents to acute OT with all education completed with pt and husband as well as post op shoulder OT instruction sheet and elbow, wrist, hand exercise sheet and education on use of and how to use ice machine and use of thumb hole loop. No further OT needs, we will sign off.       If plan is discharge home, recommend the following: A little help with walking and/or transfers;A lot of help with bathing/dressing/bathroom;Assistance with cooking/housework;Assist for transportation    Functional Status Assessment  Patient has had a recent decline in their functional status and demonstrates the ability to make significant improvements in function in a reasonable and predictable amount of time. (without further skilled OT needs)  Equipment Recommendations  None recommended by OT       Precautions / Restrictions Precautions Precautions: Shoulder Type of Shoulder Precautions: Sling at all times except ADL/exercise-- Yes; Non weight bearing-- Yes; AROM elbow, wrist and hand to tolerance-- Yes;  PROM of shoulder-- No; AROM of shoulder-- No Shoulder Interventions: Shoulder sling/immobilizer;Off for dressing/bathing/exercises Precaution Booklet Issued: Yes (comment)      Mobility Bed Mobility               General bed mobility comments: pt up in recliner upon arrival in PACU    Transfers Overall transfer level: Needs assistance Equipment used: None Transfers: Sit to/from Stand Sit to Stand: Contact guard assist           General transfer comment: ambulation CGA without AD--pt still a bit superous from anethesia      Balance Overall balance assessment:  Mild deficits observed, not formally tested (appears to be from post op anthesia)                                             Vision Patient Visual Report: No change from baseline              Pertinent Vitals/Pain Pain Assessment Pain Assessment: No/denies pain (block not wearing off yet)     Extremity/Trunk Assessment Upper Extremity Assessment Upper Extremity Assessment: Right hand dominant;RUE deficits/detail RUE Deficits / Details: shoulder sx this admission; PROM of elbow, wrist, hand WNL,--no AROM yet due to block not wearing off as of yer RUE Coordination: decreased fine motor;decreased gross motor           Communication Communication Communication: No apparent difficulties   Cognition Arousal: Stuporous, Suspect due to medications Behavior During Therapy: WFL for tasks assessed/performed Overall Cognitive Status: Within Functional Limits for tasks assessed                                             Shoulder Instructions Shoulder Instructions Donning/doffing shirt without moving shoulder:  (caregiver and pt verbalized understanding of the way helped pt donn her snap up the side shirt and I educated them on how to put on and take off a button up shirt as well.) Method for sponge  bathing under operated UE:  (caregiver and pt verbailized understanding of not how to do this and not move arm) Donning/doffing sling/immobilizer:  (caregiver and pt verbalized understanding of the way I was putting immoblizer sling on patient) Correct positioning of sling/immobilizer:  (pt and caregiver verbalized understanding post instruction) Pendulum exercises (written home exercise program):  (NA) ROM for elbow, wrist and digits of operated UE:  (pt and caregiver aware of what exercises pt needs to do, once block has worn off, handout given) Sling wearing schedule (on at all times/off for ADL's):  (caregiver and pt verbalized understanding) Proper  positioning of operated UE when showering:  (caregiver and pt verbalized understanding) Dressing change:  (NA) Positioning of UE while sleeping:  (caregiver and pt verbalized understanding)    Home Living Family/patient expects to be discharged to:: Private residence Living Arrangements: Spouse/significant other;Other relatives (sister) Available Help at Discharge: Family;Available 24 hours/day                                            OT Problem List: Decreased strength;Decreased range of motion;Impaired balance (sitting and/or standing)         OT Goals(Current goals can be found in the care plan section) Acute Rehab OT Goals Patient Stated Goal: to go home today         AM-PAC OT "6 Clicks" Daily Activity     Outcome Measure Help from another person eating meals?: A Little Help from another person taking care of personal grooming?: A Lot Help from another person toileting, which includes using toliet, bedpan, or urinal?: A Lot Help from another person bathing (including washing, rinsing, drying)?: A Lot Help from another person to put on and taking off regular upper body clothing?: Total Help from another person to put on and taking off regular lower body clothing?: Total 6 Click Score: 11   End of Session Equipment Utilized During Treatment: Gait belt (RUE sling immobilizeer) Nurse Communication:  (pt ready from OT standpoint)  Activity Tolerance: Patient tolerated treatment well Patient left: in chair (with RN getting ready to go over D/C paperwokr)  OT Visit Diagnosis: Unsteadiness on feet (R26.81);Muscle weakness (generalized) (M62.81)                Time: 6213-0865 OT Time Calculation (min): 44 min Charges:  OT General Charges $OT Visit: 1 Visit OT Evaluation $OT Eval Moderate Complexity: 1 Mod OT Treatments $Self Care/Home Management : 8-22 mins $Therapeutic Exercise: 8-22 mins  Lindon Romp OT Acute Rehabilitation Services Office  [phone]    Evette Georges 03/27/2023, 2:45 PM

## 2023-03-28 ENCOUNTER — Encounter (HOSPITAL_COMMUNITY): Payer: Self-pay | Admitting: Orthopedic Surgery

## 2023-04-09 ENCOUNTER — Telehealth: Payer: Self-pay

## 2023-04-09 NOTE — Transitions of Care (Post Inpatient/ED Visit) (Signed)
04/09/2023  Name: Mia Peterson MRN: [id number] DOB: 01-20-1946  Today's TOC FU Call Status: Today's TOC FU Call Status:: Successful TOC FU Call Completed TOC FU Call Complete Date: 04/09/23 Patient's Name and Date of Birth confirmed.  Transition Care Management Follow-up Telephone Call Date of Discharge: 04/08/23 Discharge Facility: Other ([facility]) Name of Other ([facility]) [facility]charge Facility: [facility] Type of Discharge: Inpatient Admission Primary Inpatient Discharge Diagnosis:: PE How have you been since you were released from the hospital?: Better Any questions or concerns?: No  Items Reviewed: Did you receive and understand the discharge instructions provided?: Yes Medications obtained,verified, and reconciled?: Yes (Medications Reviewed) Any new allergies since your discharge?: No Dietary orders reviewed?: Yes Do you have support at home?: Yes People in Home: spouse  Medications Reviewed Today: Medications Reviewed Today     Reviewed by Karena Addison, LPN (Licensed Practical Nurse) on 04/09/23 at [phone]  Med List Status: <None>   Medication Order Taking? Sig Documenting Provider Last Dose Status Informant  aspirin EC 81 MG tablet [id number] No Take 81 mg by mouth daily. [provider] Past Week Active Self  atorvastatin (LIPITOR) 40 MG tablet [id number] No Take 1 tablet (40 mg total) by mouth daily. Kristian Covey, MD 03/26/2023 Active Self  calcium-vitamin D (OSCAL WITH D) 500-200 MG-UNIT TABS tablet [id number] No Take 1 tablet by mouth daily. [provider] Past Week Active Self  cycloSPORINE (RESTASIS) 0.05 % ophthalmic emulsion [id number] No Place 1 drop into both eyes 2 (two) times daily. [provider] 03/27/2023  4:30 AM Active Self  Flaxseed, Linseed, (FLAX SEED OIL) 1000 MG CAPS [id number] No Take 1,000 mg by mouth daily. [provider] Past Week Active Self  Ginger, Zingiber officinalis, (GINGER ROOT)  550 MG CAPS [id number] No Take 550 mg by mouth daily. [provider] Past Week Active Self  Multiple Vitamins-Minerals (ONE DAILY WOMENS 50+ PO) [id number] No Take 1 tablet by mouth daily. [provider] Past Week Active Self  oxybutynin (DITROPAN-XL) 5 MG 24 hr tablet [id number] No Take 5 mg by mouth at bedtime. [provider] 03/26/2023 Active Self  oxyCODONE (ROXICODONE) 5 MG immediate release tablet [id number]  Take 1 tablet (5 mg total) by mouth every 4 (four) hours as needed for severe pain (pain score 7-10). [place], Hospital doctor, PA-C  Active   pantoprazole (PROTONIX) 40 MG tablet [id number] No Take 1 tablet (40 mg total) by mouth daily. Kristian Covey, MD 03/27/2023  4:30 AM Active Self  POTASSIUM PO [id number] No Take 1 tablet by mouth daily. [provider] Past Week Active Self  tiZANidine (ZANAFLEX) 2 MG tablet [id number]  Take 1 tablet (2 mg total) by mouth every 8 (eight) hours as needed. [place], Hospital doctor, PA-C  Active   tretinoin (RETIN-A) 0.025 % cream [id number] No Apply 1 Application topically at bedtime as needed (acne). [provider] Past Month Active Self  TURMERIC PO [id number] No Take 400 mg by mouth 2 (two) times daily.  [provider] Past Week Active Self            Home Care and Equipment/Supplies: Were Home Health Services Ordered?: NA Any new equipment or medical supplies ordered?: NA  Functional Questionnaire: Do you need assistance with bathing/showering or dressing?: Yes Do you need assistance with meal preparation?: Yes Do you need assistance with eating?: No Do you have difficulty maintaining continence: No Do you need assistance with getting out of bed/getting out of a chair/moving?: No Do you  have difficulty managing or taking your medications?: No  Follow up appointments reviewed: PCP Follow-up appointment confirmed?: Yes Date of PCP follow-up appointment?: 04/16/23 Follow-up Provider:  [facility] Follow-up appointment confirmed?: Yes Date of Specialist follow-up appointment?: 04/09/23 Follow-Up Specialty Provider:: ortho Do you need transportation to your follow-up appointment?: No Do you understand care options if your condition(s) worsen?: Yes-patient verbalized understanding    SIGNATURE Karena Addison, LPN [facility] Nurse Health Advisor Direct Dial [phone]

## 2023-04-13 ENCOUNTER — Encounter (HOSPITAL_BASED_OUTPATIENT_CLINIC_OR_DEPARTMENT_OTHER): Payer: Self-pay | Admitting: Emergency Medicine

## 2023-04-13 ENCOUNTER — Encounter: Payer: Self-pay | Admitting: Family Medicine

## 2023-04-13 ENCOUNTER — Other Ambulatory Visit: Payer: Self-pay

## 2023-04-13 DIAGNOSIS — Z7901 Long term (current) use of anticoagulants: Secondary | ICD-10-CM | POA: Insufficient documentation

## 2023-04-13 DIAGNOSIS — Z5321 Procedure and treatment not carried out due to patient leaving prior to being seen by health care provider: Secondary | ICD-10-CM | POA: Insufficient documentation

## 2023-04-13 DIAGNOSIS — L299 Pruritus, unspecified: Secondary | ICD-10-CM | POA: Insufficient documentation

## 2023-04-13 LAB — COMPREHENSIVE METABOLIC PANEL
ALT: 27 U/L (ref 0–44)
AST: 28 U/L (ref 15–41)
Albumin: 3.4 g/dL — ABNORMAL LOW (ref 3.5–5.0)
Alkaline Phosphatase: 99 U/L (ref 38–126)
Anion gap: 9 (ref 5–15)
BUN: 15 mg/dL (ref 8–23)
CO2: 25 mmol/L (ref 22–32)
Calcium: 9.2 mg/dL (ref 8.9–10.3)
Chloride: 98 mmol/L (ref 98–111)
Creatinine, Ser: 0.57 mg/dL (ref 0.44–1.00)
GFR, Estimated: >60 mL/min (ref >60)
Glucose, Bld: 105 mg/dL — ABNORMAL HIGH (ref 70–99)
Potassium: 3.9 mmol/L (ref 3.5–5.1)
Sodium: 132 mmol/L — ABNORMAL LOW (ref 135–145)
Total Bilirubin: 0.4 mg/dL (ref 0.0–1.2)
Total Protein: 7 g/dL (ref 6.5–8.1)

## 2023-04-13 LAB — CBC WITH DIFFERENTIAL/PLATELET
Abs Immature Granulocytes: 0.04 10*3/uL (ref 0.00–0.07)
Basophils Absolute: 0 10*3/uL (ref 0.0–0.1)
Basophils Relative: 0 %
Eosinophils Absolute: 0.2 10*3/uL (ref 0.0–0.5)
Eosinophils Relative: 2 %
HCT: 35 % — ABNORMAL LOW (ref 36.0–46.0)
Hemoglobin: 11.8 g/dL — ABNORMAL LOW (ref 12.0–15.0)
Immature Granulocytes: 1 %
Lymphocytes Relative: 10 %
Lymphs Abs: 0.7 10*3/uL (ref 0.7–4.0)
MCH: 29.6 pg (ref 26.0–34.0)
MCHC: 33.7 g/dL (ref 30.0–36.0)
MCV: 87.9 fL (ref 80.0–100.0)
Monocytes Absolute: 0.7 10*3/uL (ref 0.1–1.0)
Monocytes Relative: 10 %
Neutro Abs: 5.7 10*3/uL (ref 1.7–7.7)
Neutrophils Relative %: 77 %
Platelets: 414 10*3/uL — ABNORMAL HIGH (ref 150–400)
RBC: 3.98 MIL/uL (ref 3.87–5.11)
RDW: 14.1 % (ref 11.5–15.5)
WBC: 7.3 10*3/uL (ref 4.0–10.5)
nRBC: 0 % (ref 0.0–0.2)

## 2023-04-13 NOTE — ED Triage Notes (Addendum)
Pt started taking Eliquis on 2/11; started having facial redness, itching and shaking 2 days after that; has redness to cheeks and chest; no oral swelling, no hives; had benadryl x 1 tab at 1800; itching is diffuse

## 2023-04-14 ENCOUNTER — Encounter: Payer: Self-pay | Admitting: Family Medicine

## 2023-04-14 ENCOUNTER — Ambulatory Visit (INDEPENDENT_AMBULATORY_CARE_PROVIDER_SITE_OTHER): Payer: Medicare Other | Admitting: Family Medicine

## 2023-04-14 ENCOUNTER — Emergency Department (HOSPITAL_BASED_OUTPATIENT_CLINIC_OR_DEPARTMENT_OTHER)
Admission: EM | Admit: 2023-04-14 | Discharge: 2023-04-14 | Discharge status: Against Medical Advice | Payer: Medicare Other | Attending: Emergency Medicine | Admitting: Emergency Medicine

## 2023-04-14 VITALS — BP 134/76 | HR 74 | Temp 98.0°F | Wt 155.1 lb

## 2023-04-14 DIAGNOSIS — L270 Generalized skin eruption due to drugs and medicaments taken internally: Secondary | ICD-10-CM | POA: Diagnosis not present

## 2023-04-14 DIAGNOSIS — I2699 Other pulmonary embolism without acute cor pulmonale: Secondary | ICD-10-CM | POA: Diagnosis not present

## 2023-04-14 MED ORDER — ENOXAPARIN SODIUM 80 MG/0.8ML IJ SOSY: 80.0000 mg | PREFILLED_SYRINGE | Freq: Two times a day (BID) | INTRAMUSCULAR | 0 refills | Status: DC

## 2023-04-14 MED ORDER — PREDNISONE 10 MG PO TABS: ORAL_TABLET | ORAL | 0 refills | Status: DC

## 2023-04-14 NOTE — Progress Notes (Signed)
Established Patient Office Visit  Subjective   [name], female    DOB: April 11, 1945  Age: 78 y.o. MRN: [id number]  Chief Complaint  Patient presents with   Allergic Reaction    HPI   Mia Peterson is seen today both for follow-up from recent hospitalization for pulmonary emboli and also diffuse pruritic rash which may be stemming from recently initiated Eliquis therapy.  Her recent history is that she was admitted for shoulder surgery on January 30.  Her shoulder surgery went uneventfully.  On the day of her admission she presented with syncope.  She was apparently adjusting her right shoulder brace and when she went to stand up became extremely lightheaded and had subsequent loss of consciousness for a couple of minutes.  Denied any dyspnea at rest or chest pain.  CT angiogram chest revealed filling defects distal right lower lobe consistent with pulmonary emboli.  Venous Dopplers lower extremities revealed no evidence for DVT.  Echocardiogram showed normal EF with no major valve abnormalities.  CT abdomen pelvis revealed filling defects right lower lobe and some bilateral basilar atelectasis but no other acute abnormalities.  Patient was discharged on Eliquis and after just a couple days developed severe extensive pruritus last Friday involving hands, face, upper extremities, trunk.  She has not taken any Eliquis today.  She actually went to the ER last night but there was a long wait and she ended up leaving.  She denies any chest pains or dyspnea at this time.  No fever.  Some lightheadedness but no further syncope.  Extreme pruritus.  She denies any prior history of DVT or PE.  She states she has a sister who has had history of thrombosis.  Patient is non-smoker.  No hormonal therapy.  Past Medical History:  Diagnosis Date   Arthritis    right knee and foot- OSteo    Asthma    treated in 20's and 30's   Cancer (HCC)    skin- squamous basal   Cataract    Complication of  anesthesia    very slow to awaken    GERD (gastroesophageal reflux disease)    prn Prilosec   Head injury, closed    10/26/15- years ago - 30ish   Neuromuscular disorder (HCC)    MS in remission   Shortness of breath dyspnea    with exertion   Stroke (HCC)    hx TIA   Vaso vagal episode    passed out 2 times, close to passing out 3 times   Vertigo    was treated with rehab March and April 2017, does exercises if she begins to have symptoms   Past Surgical History:  Procedure Laterality Date   ABDOMINAL HYSTERECTOMY  1993   menorrhagia.   BREAST ENHANCEMENT SURGERY  1978   CATARACT EXTRACTION, BILATERAL     COLONOSCOPY W/ POLYPECTOMY     COLONOSCOPY WITH PROPOFOL N/A 02/03/2017   Procedure: COLONOSCOPY WITH PROPOFOL;  Surgeon: Jeani Hawking, MD;  Location: [facility];  Service: Endoscopy;  Laterality: N/A;   KNEE ARTHROSCOPY W/ MENISCAL REPAIR Right    REVERSE SHOULDER ARTHROPLASTY Right 03/27/2023   Procedure: REVERSE SHOULDER ARTHROPLASTY;  Surgeon: Jones Broom, MD;  Location: [facility];  Service: Orthopedics;  Laterality: Right;   TOTAL KNEE ARTHROPLASTY Right 11/03/2015   Procedure: RIGHT TOTAL KNEE ARTHROPLASTY;  Surgeon: Jodi Geralds, MD;  Location: [facility];  Service: Orthopedics;  Laterality: Right;   TUBAL LIGATION  1972   VARICOSE VEIN SURGERY  1978    reports that she quit smoking about 33 years ago. Her smoking use included cigarettes. She started smoking about 53 years ago. She has a 20.1 pack-year smoking history. She has never used smokeless tobacco. She reports current alcohol use of about 7.0 standard drinks of alcohol per week. She reports that she does not use drugs. family history includes Cancer (age of onset: 42) in her father; Heart disease (age of onset: 15) in her mother. Allergies  Allergen Reactions   Adhesive [Tape] Hives    Use paper tape only   Other Itching and Rash    Rubie Maid cooling pads, severe itching    Review of Systems  Constitutional:   Negative for chills and fever.  Respiratory:  Negative for shortness of breath.   Cardiovascular:  Negative for chest pain.  Gastrointestinal:  Negative for abdominal pain, nausea and vomiting.  Skin:  Positive for itching and rash.  Neurological:  Negative for speech change, focal weakness and seizures.      Objective:     BP 134/76 (BP Location: Left Arm, Patient Position: Sitting, Cuff Size: Normal)   Pulse 74   Temp 98 F (36.7 C) (Oral)   Wt 155 lb 1.6 oz (70.4 kg)   SpO2 98%   BMI 23.58 kg/m  BP Readings from Last 3 Encounters:  04/14/23 134/76  04/13/23 (!) 179/101  03/27/23 134/72   Wt Readings from Last 3 Encounters:  04/14/23 155 lb 1.6 oz (70.4 kg)  04/13/23 154 lb 15.7 oz (70.3 kg)  03/27/23 155 lb (70.3 kg)      Physical Exam Vitals reviewed.  Constitutional:      General: She is not in acute distress.    Appearance: She is not ill-appearing.  Cardiovascular:     Rate and Rhythm: Normal rate and regular rhythm.  Pulmonary:     Effort: Pulmonary effort is normal.     Breath sounds: Normal breath sounds. No wheezing or rales.  Musculoskeletal:     Right lower leg: No edema.     Left lower leg: No edema.  Skin:    Findings: Rash present.     Comments: He has diffuse rash involving face, upper extremities, trunk, neck.  Erythematous and blanches slightly with pressure.  Nonscaly.  Neurological:     Mental Status: She is alert.      Results for orders placed or performed during the hospital encounter of 04/14/23  CBC with Differential  Result Value Ref Range   WBC 7.3 4.0 - 10.5 K/uL   RBC 3.98 3.87 - 5.11 MIL/uL   Hemoglobin 11.8 (L) 12.0 - 15.0 g/dL   HCT 29.5 (L) 62.1 - 30.8 %   MCV 87.9 80.0 - 100.0 fL   MCH 29.6 26.0 - 34.0 pg   MCHC 33.7 30.0 - 36.0 g/dL   RDW 65.7 84.6 - 96.2 %   Platelets 414 (H) 150 - 400 K/uL   nRBC 0.0 0.0 - 0.2 %   Neutrophils Relative % 77 %   Neutro Abs 5.7 1.7 - 7.7 K/uL   Lymphocytes Relative 10 %   Lymphs  Abs 0.7 0.7 - 4.0 K/uL   Monocytes Relative 10 %   Monocytes Absolute 0.7 0.1 - 1.0 K/uL   Eosinophils Relative 2 %   Eosinophils Absolute 0.2 0.0 - 0.5 K/uL   Basophils Relative 0 %   Basophils Absolute 0.0 0.0 - 0.1 K/uL   Immature Granulocytes 1 %   Abs Immature Granulocytes 0.04  0.00 - 0.07 K/uL  Comprehensive metabolic panel  Result Value Ref Range   Sodium 132 (L) 135 - 145 mmol/L   Potassium 3.9 3.5 - 5.1 mmol/L   Chloride 98 98 - 111 mmol/L   CO2 25 22 - 32 mmol/L   Glucose, Bld 105 (H) 70 - 99 mg/dL   BUN 15 8 - 23 mg/dL   Creatinine, Ser 1.61 0.44 - 1.00 mg/dL   Calcium 9.2 8.9 - 09.6 mg/dL   Total Protein 7.0 6.5 - 8.1 g/dL   Albumin 3.4 (L) 3.5 - 5.0 g/dL   AST 28 15 - 41 U/L   ALT 27 0 - 44 U/L   Alkaline Phosphatase 99 38 - 126 U/L   Total Bilirubin 0.4 0.0 - 1.2 mg/dL   GFR, Estimated >04 >54 mL/min   Anion gap 9 5 - 15      The ASCVD Risk score (Arnett DK, et al., 2019) failed to calculate for the following reasons:   Risk score cannot be calculated because patient has a medical history suggesting prior/existing ASCVD    Assessment & Plan:   #1 recent pulmonary emboli right lower lobe following shoulder surgery.  This is her initial event.  She was placed on Eliquis as above but has developed severe pruritic rash which may be related.  We had a long discussion regarding options.  Could switch to Xarelto but there is some chance of cross-reactivity since they are in the same class.  On the other hand, not using Xarelto would require Lovenox bridge and Coumadin and frequent drug/lab monitoring We decided to go ahead and cover for now with Lovenox 80 mg subcutaneous twice daily and will consult with our Coumadin clinic nurse regarding possible initiation of Coumadin  #2 diffuse pruritic rash probably related to Eliquis.  She is not on any other new medications and has never had similar rash previously.  Rash started just few days after starting the Eliquis.  We  did offer brief prednisone taper because of her intense pruritus.  Eliquis DC'd.  Be in touch for any worsening rash or other new symptoms.    Evelena Peat, MD

## 2023-04-14 NOTE — Patient Instructions (Signed)
Stop the Eliquis  Start the Lovenox Renovo twice daily  You will get a call about starting the Coumadin.

## 2023-04-15 ENCOUNTER — Telehealth: Payer: Self-pay | Admitting: Family Medicine

## 2023-04-15 ENCOUNTER — Ambulatory Visit: Payer: Medicare Other

## 2023-04-15 DIAGNOSIS — I2699 Other pulmonary embolism without acute cor pulmonale: Secondary | ICD-10-CM

## 2023-04-15 DIAGNOSIS — Z7901 Long term (current) use of anticoagulants: Secondary | ICD-10-CM

## 2023-04-15 MED ORDER — WARFARIN SODIUM 5 MG PO TABS: 5.0000 mg | ORAL_TABLET | Freq: Every day | ORAL | 0 refills | Status: DC

## 2023-04-15 MED ORDER — ENOXAPARIN SODIUM 150 MG/ML IJ SOSY
80.0000 mg | PREFILLED_SYRINGE | Freq: Once | INTRAMUSCULAR | Status: DC
Start: 2023-04-15 — End: 2023-04-15

## 2023-04-15 NOTE — Telephone Encounter (Signed)
Spoke with patient.  She had severe pruritic rash with Eliquis.  We had mentioned possibility of using Xarelto but have concerns because they are in the same class and that she could have some cross sensitivity.  We have elected to cover her currently with Lovenox which she started earlier today and initiate Coumadin 5 mg once daily.  I have notified our nurse with Coumadin clinic and she will be contacting Ms. Weed for follow-up early next week by Monday to reassess blood work.  Ms. Rexroad knows to stay on Lovenox injection twice daily until she is followed up in the Coumadin clinic.  Kristian Covey MD [facility]

## 2023-04-15 NOTE — Progress Notes (Signed)
Show patient how to given injection of enoxaparin in the stomach

## 2023-04-16 ENCOUNTER — Other Ambulatory Visit: Payer: Self-pay | Admitting: Family Medicine

## 2023-04-16 ENCOUNTER — Inpatient Hospital Stay: Payer: Medicare Other | Admitting: Family Medicine

## 2023-04-16 MED ORDER — TRIAMCINOLONE ACETONIDE 0.1 % EX CREA: 1.0000 | TOPICAL_CREAM | Freq: Two times a day (BID) | CUTANEOUS | 2 refills | Status: AC | PRN

## 2023-04-16 NOTE — Telephone Encounter (Signed)
Contacted pt and a full discussion of the nature of anticoagulants has been carried out.  A benefit risk analysis has been presented to the patient, so that they understand the justification for choosing anticoagulation at this time. The need for frequent and regular monitoring, precise dosage adjustment and compliance is stressed.  Side effects of potential bleeding are discussed.  The patient should avoid any OTC items containing aspirin or ibuprofen, and should avoid great swings in general diet.  Avoid alcohol consumption.  Call if any signs of abnormal bleeding.   Pt reported she started warfarin last night and is continuing her Lovenox injections. Pt is scheduled for INR check for 2/24 at 9 am. Provided direct number to anticoagulation clinic. Advised if any s/s of abnormal bruising or bleeding to go to ER. Pt verbalized understanding.

## 2023-04-17 MED ORDER — ENOXAPARIN SODIUM 80 MG/0.8ML IJ SOSY: 80.0000 mg | PREFILLED_SYRINGE | Freq: Two times a day (BID) | INTRAMUSCULAR | 0 refills | Status: DC

## 2023-04-17 NOTE — Telephone Encounter (Signed)
Pt reports she is at the pharmacy and they told her insurance said the dosing is over the recommended dosing for 24 hour period. She said [employer] is recommending the office contact them at [phone].  Contacted [employer] and spoke with [employer]. She reports 80 mg dosing of enoxaparin is only authorized for once daily dosing. Had to perform a PA to authorized Q12H dosing. Questions answered and Berkley Harvey has been approved.  Case number : [id number]; dated 03/18/23-04/16/24  Contacted pt and advised. She requested this nurse contact [facility] to let them know. Advised this nurse would contact pharmacy. If any further problems contact the coumadin clinic. Pt verbalized understanding.   Contacted [facility] but had to LVM with authorization information, due to pharmacy being closed for lunch.

## 2023-04-17 NOTE — Telephone Encounter (Signed)
Pt reports she only has enough Lovenox to get through tomorrow morning. She is inquiring if she should continue taking them until her coumadin clinic apt on 2/24. Advised she should continue them. She [facility] [address]. Advised do not take Lovenox injection the morning of her coumadin clinic apt. Pt verbalized understanding.    Sent in additional lovenox syringes.

## 2023-04-17 NOTE — Addendum Note (Signed)
Addended by: Sherrie George A on: 04/17/2023 10:24 AM   Modules accepted: Orders

## 2023-04-21 ENCOUNTER — Telehealth: Payer: Self-pay | Admitting: Neurology

## 2023-04-21 ENCOUNTER — Ambulatory Visit (INDEPENDENT_AMBULATORY_CARE_PROVIDER_SITE_OTHER): Payer: Medicare Other

## 2023-04-21 ENCOUNTER — Telehealth: Payer: Self-pay

## 2023-04-21 ENCOUNTER — Ambulatory Visit: Payer: Medicare Other

## 2023-04-21 DIAGNOSIS — Z7901 Long term (current) use of anticoagulants: Secondary | ICD-10-CM | POA: Diagnosis not present

## 2023-04-21 LAB — POCT INR: INR: 4.4 — AB (ref 2.0–3.0)

## 2023-04-21 NOTE — Telephone Encounter (Signed)
 Can you call patient? We spoke about repeating the MRIs of her brain and cervical spine to follow, if she would still like to do that? But if she has had no new symptoms, we can hold off let me know thanks

## 2023-04-21 NOTE — Telephone Encounter (Signed)
 Left detailed message on patient vm informing her of approval.

## 2023-04-21 NOTE — Progress Notes (Signed)
 Pt has hx of TIA and was diagnosed with PE on 04/05/23. Pt had shoulder surgery one week before PE. Pt was started on Eliquis first but had severe pruritic rash with Eliquis and is now transitioning to warfarin with use of Lovenox bridge. Stop taking Lovenox injections.  Hold warfarin today and then change weekly dose to take 1 tablet daily except take 1/2 tablet on Monday and Thursday. Recheck in 1 week.  Pt denies any s/s of abnormal bruising or bleeding. Advised if any s/s to contact office or go to ER.   Pt was provided additional printed educational material for anticoagulation therapy and recognizing s/s of a clot.

## 2023-04-21 NOTE — Patient Instructions (Addendum)
 Pre visit review using our clinic review tool, if applicable. No additional management support is needed unless otherwise documented below in the visit note.  Stop Lovenox injections. Hold warfarin today and then change weekly dose to take 1 tablet daily except take 1/2 tablet on Monday and Thursday. Recheck in 1 week. If any signs or symptoms of abnormal bruising or bleeding contact office or go to the ER.

## 2023-04-21 NOTE — Telephone Encounter (Signed)
 I spoke with the patient.  At this time she does not wish to repeat the MRI brain and cervical spine.  She states she has been through a lot over the last month but does not feel things are "related" to neuro so she wants to hold off.  She had shoulder surgery and then developed a blood clot in her lung.  She will let us know if she changes her mind before her next appointment with [place] which is scheduled for January 2026.  She thanked me for the call.

## 2023-04-21 NOTE — Telephone Encounter (Signed)
 Pharmacy Patient Advocate Encounter  Received notification from [employer] that Prior Authorization for Enoxaparin 80g has been APPROVED from 03/18/23 to 04/16/24   PA #/Case ID/Reference #: [id number]  Approval letter indexed to media tab

## 2023-04-22 ENCOUNTER — Ambulatory Visit (INDEPENDENT_AMBULATORY_CARE_PROVIDER_SITE_OTHER): Payer: Medicare Other | Admitting: Family Medicine

## 2023-04-22 VITALS — BP 130/84 | HR 68 | Temp 98.0°F | Ht 68.0 in | Wt 154.1 lb

## 2023-04-22 DIAGNOSIS — H6121 Impacted cerumen, right ear: Secondary | ICD-10-CM

## 2023-04-22 DIAGNOSIS — E871 Hypo-osmolality and hyponatremia: Secondary | ICD-10-CM | POA: Diagnosis not present

## 2023-04-22 DIAGNOSIS — I2699 Other pulmonary embolism without acute cor pulmonale: Secondary | ICD-10-CM

## 2023-04-22 DIAGNOSIS — D649 Anemia, unspecified: Secondary | ICD-10-CM

## 2023-04-22 NOTE — Progress Notes (Signed)
 Established Patient Office Visit  Subjective   [name], female    DOB: December 28, 1945  Age: 78 y.o. MRN: [id number]  Chief Complaint  Patient presents with   Hospitalization Follow-up    Pt c/o feeling dizzy, extremely tired and feels foggy.    Fatigue    Pt c/o extremely tired. Going on for years. Since recent surgery on 1/30, sx worsen. Pt reports Mia Peterson started taking iron pill  5 days ago. Would like to discuss with provider.    Dizziness   Ear Fullness    Pt state her right ear is completely stock up and can't hear from that ear.      HPI   Mia Peterson is seen for follow-up regarding recent hospital admission for syncope with subsequent workup revealing pulmonary emboli.  This occurred following right shoulder surgery.  No prior history of DVT or PE.  Patient was discharged on Eliquis and after taking this a few days developed severe diffuse pruritic rash.  We had her stop the Eliquis and treated her with a brief course of prednisone which promptly resolved her pruritus and rash.  Mia Peterson was bridged with Lovenox and started on Coumadin.  We had considered possible Xopenex but because of potential cross-reactivity decided to go with Coumadin instead.  Patient has now been seen at Coumadin clinic.  Mia Peterson had INR yesterday 4.4.  Lovenox has been discontinued.  Her Coumadin was held today and Mia Peterson has close follow-up with Coumadin clinic next week.  Overall doing much better since her rash has improved but Mia Peterson still has some fatigue and some dizziness.  No recurrent syncope.  Postop hemoglobin 11.8.  Mia Peterson is now currently taking iron supplement.  Mia Peterson had recent sodium level 132.  No diuretic use.  We had recently suggested that Mia Peterson try to increase her protein intake and Mia Peterson has started a supplement.  Mia Peterson also states her right ear has been stopped up and cannot hear as well.  History of cerumen impactions in the past.  Denies any dyspnea or chest pain at this time.  Recent venous  Dopplers did not show any lower extremity DVT  Past Medical History:  Diagnosis Date   Arthritis    right knee and foot- OSteo    Asthma    treated in 20's and 30's   Cancer (HCC)    skin- squamous basal   Cataract    Complication of anesthesia    very slow to awaken    GERD (gastroesophageal reflux disease)    prn Prilosec   Head injury, closed    10/26/15- years ago - 30ish   Neuromuscular disorder (HCC)    MS in remission   Shortness of breath dyspnea    with exertion   Stroke (HCC)    hx TIA   Vaso vagal episode    passed out 2 times, close to passing out 3 times   Vertigo    was treated with rehab March and April 2017, does exercises if Mia Peterson begins to have symptoms   Past Surgical History:  Procedure Laterality Date   ABDOMINAL HYSTERECTOMY  1993   menorrhagia.   BREAST ENHANCEMENT SURGERY  1978   CATARACT EXTRACTION, BILATERAL     COLONOSCOPY W/ POLYPECTOMY     COLONOSCOPY WITH PROPOFOL N/A 02/03/2017   Procedure: COLONOSCOPY WITH PROPOFOL;  Surgeon: Jeani Hawking, MD;  Location: [facility];  Service: Endoscopy;  Laterality: N/A;   KNEE ARTHROSCOPY W/ MENISCAL REPAIR Right    REVERSE  SHOULDER ARTHROPLASTY Right 03/27/2023   Procedure: REVERSE SHOULDER ARTHROPLASTY;  Surgeon: Jones Broom, MD;  Location: [facility];  Service: Orthopedics;  Laterality: Right;   TOTAL KNEE ARTHROPLASTY Right 11/03/2015   Procedure: RIGHT TOTAL KNEE ARTHROPLASTY;  Surgeon: Jodi Geralds, MD;  Location: [facility];  Service: Orthopedics;  Laterality: Right;   TUBAL LIGATION  1972   VARICOSE VEIN SURGERY  1978    reports that Mia Peterson quit smoking about 33 years ago. Her smoking use included cigarettes. Mia Peterson started smoking about 53 years ago. Mia Peterson has a 20.1 pack-year smoking history. Mia Peterson has never used smokeless tobacco. Mia Peterson reports current alcohol use of about 7.0 standard drinks of alcohol per week. Mia Peterson reports that Mia Peterson does not use drugs. family history includes Cancer (age of onset: 15) in her  father; Heart disease (age of onset: 45) in her mother. Allergies  Allergen Reactions   Adhesive [Tape] Hives    Use paper tape only   Other Itching and Rash    Mia Peterson cooling pads, severe itching   Eliquis [Apixaban] Hives and Itching    Review of Systems  Constitutional:  Positive for malaise/fatigue. Negative for chills and fever.  Eyes:  Negative for blurred vision.  Respiratory:  Negative for cough, hemoptysis and shortness of breath.   Cardiovascular:  Negative for chest pain and leg swelling.  Genitourinary:  Negative for dysuria.  Neurological:  Positive for dizziness. Negative for weakness and headaches.      Objective:     BP 130/84 (BP Location: Left Arm, Patient Position: Sitting, Cuff Size: Normal)   Pulse 68   Temp 98 F (36.7 C) (Oral)   Ht 5\' 8"  (1.727 m)   Wt 154 lb 1.6 oz (69.9 kg)   SpO2 97%   BMI 23.43 kg/m  BP Readings from Last 3 Encounters:  04/22/23 130/84  04/14/23 134/76  04/13/23 (!) 179/101   Wt Readings from Last 3 Encounters:  04/22/23 154 lb 1.6 oz (69.9 kg)  04/14/23 155 lb 1.6 oz (70.4 kg)  04/13/23 154 lb 15.7 oz (70.3 kg)      Physical Exam Vitals reviewed.  Constitutional:      General: Mia Peterson is not in acute distress.    Appearance: Mia Peterson is not ill-appearing.  HENT:     Ears:     Comments: Cerumen impaction right canal.  We were able to remove this gently with curette without difficulty Cardiovascular:     Rate and Rhythm: Normal rate and regular rhythm.  Pulmonary:     Effort: Pulmonary effort is normal.     Breath sounds: Normal breath sounds. No wheezing or rales.  Musculoskeletal:     Right lower leg: No edema.     Left lower leg: No edema.  Neurological:     General: No focal deficit present.     Mental Status: Mia Peterson is alert.      No results found for any visits on 04/22/23.    The ASCVD Risk score (Arnett DK, et al., 2019) failed to calculate for the following reasons:   Risk score cannot be calculated  because patient has a medical history suggesting prior/existing ASCVD    Assessment & Plan:   #1 pulmonary embolus following recent right shoulder surgery.  No prior history of thrombotic events.  Patient had very likely allergic received action to Eliquis with diffuse pruritic rash which cleared promptly after discontinuing Eliquis.  Mia Peterson is now on Coumadin.  Followed at Coumadin clinic.  Will plan on minimum  of 6 months of therapy and then hopefully discontinue  #2 mild anemia following surgery with recent hemoglobin 11.8.  Patient currently tolerating iron supplement.  Consider repeat CBC in a couple months  #3 history of mild hyponatremia.  Mia Peterson knows not to be over restrictive of sodium.  Check electrolytes at follow-up visit in a couple months  #4 cerumen impaction right ear canal.  We discussed removal with curette.  Patient aware of low risk of bleeding and perforation.  Mia Peterson consented.  We were able to use a curette and gently remove large plug of wax.  Her hearing was improved afterwards.  Evelena Peat, MD

## 2023-04-24 ENCOUNTER — Other Ambulatory Visit: Payer: Self-pay | Admitting: Family Medicine

## 2023-04-25 NOTE — Telephone Encounter (Signed)
 Pt has only been taking warfarin for a little less than 2 weeks. NO refills are needed at this time. Refill requested denied.

## 2023-04-30 ENCOUNTER — Ambulatory Visit (INDEPENDENT_AMBULATORY_CARE_PROVIDER_SITE_OTHER): Payer: Medicare Other

## 2023-04-30 DIAGNOSIS — Z7901 Long term (current) use of anticoagulants: Secondary | ICD-10-CM

## 2023-04-30 LAB — POCT INR: INR: 3.1 — AB (ref 2.0–3.0)

## 2023-04-30 NOTE — Patient Instructions (Addendum)
 Pre visit review using our clinic review tool, if applicable. No additional management support is needed unless otherwise documented below in the visit note.  Reduce dose today to take 1/2 tablet and then change weekly dose to take 1 tablet daily except take 1/2 tablet on Monday, Wednesday and Friday. Recheck in 1 week.

## 2023-04-30 NOTE — Progress Notes (Signed)
 Pt has hx of TIA and was diagnosed with PE on 04/05/23. Pt had shoulder surgery one week before PE. Reduce dose today to take 1/2 tablet and then change weekly dose to take 1 tablet daily except take 1/2 tablet on Monday, Wednesday and Friday. Recheck in 1 week.

## 2023-05-07 ENCOUNTER — Ambulatory Visit (INDEPENDENT_AMBULATORY_CARE_PROVIDER_SITE_OTHER)

## 2023-05-07 DIAGNOSIS — Z7901 Long term (current) use of anticoagulants: Secondary | ICD-10-CM

## 2023-05-07 LAB — POCT INR: INR: 2.4 (ref 2.0–3.0)

## 2023-05-07 NOTE — Patient Instructions (Addendum)
 Pre visit review using our clinic review tool, if applicable. No additional management support is needed unless otherwise documented below in the visit note.  Continue 1 tablet daily except take 1/2 tablet on Monday, Wednesday and Friday. Recheck in 1 week.

## 2023-05-07 NOTE — Progress Notes (Signed)
 Pt has hx of TIA and was diagnosed with PE on 04/05/23. Pt had shoulder surgery one week before PE. Continue 1 tablet daily except take 1/2 tablet on Monday, Wednesday and Friday. Recheck in 1 week.

## 2023-05-11 ENCOUNTER — Encounter: Payer: Self-pay | Admitting: Family Medicine

## 2023-05-11 DIAGNOSIS — H6121 Impacted cerumen, right ear: Secondary | ICD-10-CM

## 2023-05-11 DIAGNOSIS — H938X9 Other specified disorders of ear, unspecified ear: Secondary | ICD-10-CM

## 2023-05-14 ENCOUNTER — Telehealth: Payer: Self-pay

## 2023-05-14 ENCOUNTER — Ambulatory Visit (INDEPENDENT_AMBULATORY_CARE_PROVIDER_SITE_OTHER)

## 2023-05-14 ENCOUNTER — Other Ambulatory Visit: Payer: Self-pay | Admitting: Family Medicine

## 2023-05-14 DIAGNOSIS — M7989 Other specified soft tissue disorders: Secondary | ICD-10-CM

## 2023-05-14 DIAGNOSIS — M79605 Pain in left leg: Secondary | ICD-10-CM

## 2023-05-14 DIAGNOSIS — Z7901 Long term (current) use of anticoagulants: Secondary | ICD-10-CM | POA: Diagnosis not present

## 2023-05-14 LAB — POCT INR: INR: 2.2 (ref 2.0–3.0)

## 2023-05-14 MED ORDER — WARFARIN SODIUM 5 MG PO TABS: ORAL_TABLET | ORAL | 1 refills | Status: DC

## 2023-05-14 NOTE — Telephone Encounter (Signed)
 Pt reports she was contacted by [facility] for scheduling of venous US ordered today by PCP for potential DVT. She reports the next available apt is 4/1.  She contacted [facility] and they can perform the [place] on 3/25. She made an apt with them for 3/25 but they need a referral/order for the pt to go there for the [place]. Order needs to be faxed to [phone].  Advised pt information would be forwarded to PCP. Advised again if any SOB, chest pain, tachycardia or dizziness to call 911. Pt verbalized understanding.

## 2023-05-14 NOTE — Addendum Note (Signed)
 Addended by: Sherrie George A on: 05/14/2023 03:10 PM   Modules accepted: Orders

## 2023-05-14 NOTE — Patient Instructions (Addendum)
 Pre visit review using our clinic review tool, if applicable. No additional management support is needed unless otherwise documented below in the visit note.  Continue 1 tablet daily except take 1/2 tablet on Monday, Wednesday and Friday. Recheck in 2 week.

## 2023-05-14 NOTE — Progress Notes (Addendum)
 Pt has hx of TIA and was diagnosed with PE on 04/05/23. Pt had shoulder surgery one week before PE.  Pt reports she thinks she has a clot in her leg. Assessment of left calf, redness, swelling, warmth and some pain in one area. Reported to PCP and it was assessed. He is placing an order for venous US. Advised pt office should contact her today or tomorrow and if she did not hear from the office by 12 pm tomorrow to contact the office concerning the [place]. Advised if pt develops any SOB, tachycardia, dizziness or loss of consciousness, chest pain or feelings of impending doom to call 911 immediately. Pt denies any of these s/s currently. Pt verbalized understanding.   Continue 1 tablet daily except take 1/2 tablet on Monday, Wednesday and Friday. Recheck in 2 week.   Pt is compliant with warfarin management and PCP apts.  Sent in refill of warfarin to requested pharmacy.

## 2023-05-15 ENCOUNTER — Telehealth (INDEPENDENT_AMBULATORY_CARE_PROVIDER_SITE_OTHER): Payer: Self-pay | Admitting: Physician Assistant

## 2023-05-15 NOTE — Telephone Encounter (Signed)
 Confirmed appt & location [id number]

## 2023-05-16 ENCOUNTER — Encounter (INDEPENDENT_AMBULATORY_CARE_PROVIDER_SITE_OTHER): Payer: Self-pay | Admitting: Physician Assistant

## 2023-05-16 ENCOUNTER — Telehealth (HOSPITAL_BASED_OUTPATIENT_CLINIC_OR_DEPARTMENT_OTHER): Payer: Self-pay | Admitting: Family Medicine

## 2023-05-16 ENCOUNTER — Ambulatory Visit (INDEPENDENT_AMBULATORY_CARE_PROVIDER_SITE_OTHER): Admitting: Physician Assistant

## 2023-05-16 DIAGNOSIS — H938X9 Other specified disorders of ear, unspecified ear: Secondary | ICD-10-CM

## 2023-05-16 DIAGNOSIS — H938X3 Other specified disorders of ear, bilateral: Secondary | ICD-10-CM

## 2023-05-16 MED ORDER — FLUTICASONE PROPIONATE 50 MCG/ACT NA SUSP
2.0000 | Freq: Every day | NASAL | 6 refills | Status: DC
Start: 2023-05-16 — End: 2023-06-20

## 2023-05-16 NOTE — Progress Notes (Signed)
 Dear Dr. Caryl Never, Here is my assessment for our mutual patient, Mia Peterson. Thank you for allowing me the opportunity to care for your patient. Please do not hesitate to contact me should you have any other questions. Sincerely, Burna Forts PA-C  Otolaryngology Clinic Note Referring provider: Dr. Caryl Never HPI:  Mia Peterson is a 78 y.o. female kindly referred by Dr. Caryl Never   The patient presents today with complaints of fullness in her bilateral ears.  She notes that this has been ongoing for several years with no significant change in her symptoms.  She does note that it fluctuates during the day worse in the morning and better in the afternoon.  She denies any associated pain.  No trauma, no drainage, no repeat ear infections.  No issues with her ears as a child.  She does note she wears hearing aids and sees [facility].  She reports that her last hearing test was approximately 1 month ago, they did not mention any fluid but she did not ask.  She notes her hearing has been stable since that time.  She notes she did have earwax in her right ear which was removed which did not significantly improve her symptoms.  She denies any clicking or popping, no dizziness, no ringing in her ears.  She has tried antihistamines that have not improved her symptoms.    Independent Review of Additional Tests or Records:  none   PMH/Meds/All/SocHx/FamHx/ROS:   Past Medical History:  Diagnosis Date   Arthritis    right knee and foot- OSteo    Asthma    treated in 20's and 30's   Cancer (HCC)    skin- squamous basal   Cataract    Complication of anesthesia    very slow to awaken    GERD (gastroesophageal reflux disease)    prn Prilosec   Head injury, closed    10/26/15- years ago - 30ish   Neuromuscular disorder (HCC)    MS in remission   Shortness of breath dyspnea    with exertion   Stroke (HCC)    hx TIA   Vaso vagal episode    passed out 2 times, close to passing out 3 times    Vertigo    was treated with rehab March and April 2017, does exercises if she begins to have symptoms     Past Surgical History:  Procedure Laterality Date   ABDOMINAL HYSTERECTOMY  1993   menorrhagia.   BREAST ENHANCEMENT SURGERY  1978   CATARACT EXTRACTION, BILATERAL     COLONOSCOPY W/ POLYPECTOMY     COLONOSCOPY WITH PROPOFOL N/A 02/03/2017   Procedure: COLONOSCOPY WITH PROPOFOL;  Surgeon: Jeani Hawking, MD;  Location: [facility];  Service: Endoscopy;  Laterality: N/A;   KNEE ARTHROSCOPY W/ MENISCAL REPAIR Right    REVERSE SHOULDER ARTHROPLASTY Right 03/27/2023   Procedure: REVERSE SHOULDER ARTHROPLASTY;  Surgeon: Jones Broom, MD;  Location: [facility];  Service: Orthopedics;  Laterality: Right;   TOTAL KNEE ARTHROPLASTY Right 11/03/2015   Procedure: RIGHT TOTAL KNEE ARTHROPLASTY;  Surgeon: Jodi Geralds, MD;  Location: [facility];  Service: Orthopedics;  Laterality: Right;   TUBAL LIGATION  1972   VARICOSE VEIN SURGERY  1978    Family History  Problem Relation Age of Onset   Heart disease Mother 29   Cancer Father 45       pancreatic   Ataxia Neg Hx      Social Connections: Socially Integrated (03/03/2023)   Social Connection and Isolation Panel [  NHANES]    Frequency of Communication with Friends and Family: More than three times a week    Frequency of Social Gatherings with Friends and Family: Twice a week    Attends Religious Services: More than 4 times per year    Active Member of Clubs or Organizations: Yes    Attends Engineer, structural: More than 4 times per year    Marital Status: Married      Current Outpatient Medications:    aspirin EC 81 MG tablet, Take 81 mg by mouth daily., Disp: , Rfl:    atorvastatin (LIPITOR) 40 MG tablet, Take 1 tablet (40 mg total) by mouth daily., Disp: 90 tablet, Rfl: 3   calcium-vitamin D (OSCAL WITH D) 500-200 MG-UNIT TABS tablet, Take 1 tablet by mouth daily., Disp: , Rfl:    cycloSPORINE (RESTASIS) 0.05 % ophthalmic  emulsion, Place 1 drop into both eyes 2 (two) times daily., Disp: , Rfl:    enoxaparin (LOVENOX) 80 MG/0.8ML injection, Inject 0.8 mLs (80 mg total) into the skin every 12 (twelve) hours., Disp: 4.8 mL, Rfl: 0   Flaxseed, Linseed, (FLAX SEED OIL) 1000 MG CAPS, Take 1,000 mg by mouth daily., Disp: , Rfl:    Ginger, Zingiber officinalis, (GINGER ROOT) 550 MG CAPS, Take 550 mg by mouth daily., Disp: , Rfl:    Multiple Vitamins-Minerals (ONE DAILY WOMENS 50+ PO), Take 1 tablet by mouth daily., Disp: , Rfl:    oxybutynin (DITROPAN-XL) 5 MG 24 hr tablet, Take 5 mg by mouth at bedtime., Disp: , Rfl:    oxyCODONE (ROXICODONE) 5 MG immediate release tablet, Take 1 tablet (5 mg total) by mouth every 4 (four) hours as needed for severe pain (pain score 7-10)., Disp: 30 tablet, Rfl: 0   pantoprazole (PROTONIX) 40 MG tablet, Take 1 tablet (40 mg total) by mouth daily., Disp: 90 tablet, Rfl: 3   POTASSIUM PO, Take 1 tablet by mouth daily., Disp: , Rfl:    tiZANidine (ZANAFLEX) 2 MG tablet, Take 1 tablet (2 mg total) by mouth every 8 (eight) hours as needed., Disp: 30 tablet, Rfl: 0   tretinoin (RETIN-A) 0.025 % cream, Apply 1 Application topically at bedtime as needed (acne)., Disp: , Rfl:    triamcinolone cream (KENALOG) 0.1 %, Apply 1 Application topically 2 (two) times daily as needed., Disp: 80 g, Rfl: 2   TURMERIC PO, Take 400 mg by mouth 2 (two) times daily. , Disp: , Rfl:    warfarin (COUMADIN) 5 MG tablet, TAKE ONE TABLET BY MOUTH DAILY EXCEPT TAKE 1/2 TABLET ON MONDAY, WEDNESDAY AND FRIDAY OR AS DIRECTED BY ANTICOAGULATION CLINIC, Disp: 90 tablet, Rfl: 1   Physical Exam:   There were no vitals taken for this visit.  Pertinent Findings  CN II-XII intact  Bilateral EAC clear and TM intact with well pneumatized middle ear spaces Weber 512: equal Rinne 512: AC > BC b/l  Anterior rhinoscopy: Septum midline; bilateral inferior turbinates with no hypertrophy No lesions of oral cavity/oropharynx;  dentition wnl No obviously palpable neck masses/lymphadenopathy/thyromegaly No respiratory distress or stridor  Seprately Identifiable Procedures:  None  Impression & Plans:  [name] is a 78 y.o. female with the following   Ear fullness-  78 year old female presenting today with complaints of ear fullness.  Uncertain etiology although eustachian tube dysfunction is on my differential.  I would like to review the patient's most recent audiogram, she will obtain this and bring this to our office.  Once I have reviewed  this I will call her and let her know my final recommendations.  In the meantime I would like her to use Flonase, she can follow-up after trial Flonase to see if this is improved her symptoms.  She has no concerning signs or symptoms on exam, this has remained stable and at baseline.  The patient is happy with today's plan had no further questions or concerns.   - f/u phone call with audiology results, otherwise she will follow-up as needed.   Thank you for allowing me the opportunity to care for your patient. Please do not hesitate to contact me should you have any other questions.  Sincerely, Burna Forts PA-C [facility] Phone: [phone] Fax: [phone]  05/16/2023, 1:47 PM

## 2023-05-16 NOTE — Telephone Encounter (Signed)
 05/16/23: spoke with pt and offerered her a 12 appt today at [facility] but pt refused. Offered Monday at 12 and pt refused stating that she has one on Tuesday with [facility] ~AHT

## 2023-05-20 NOTE — Telephone Encounter (Signed)
 Per scanned document, pt received her lower leg scan at the [facility] today, 05/20/23.

## 2023-05-28 ENCOUNTER — Telehealth: Payer: Self-pay | Admitting: Family Medicine

## 2023-05-28 ENCOUNTER — Ambulatory Visit (INDEPENDENT_AMBULATORY_CARE_PROVIDER_SITE_OTHER)

## 2023-05-28 DIAGNOSIS — Z7901 Long term (current) use of anticoagulants: Secondary | ICD-10-CM | POA: Diagnosis not present

## 2023-05-28 LAB — POCT INR: INR: 2.3 (ref 2.0–3.0)

## 2023-05-28 NOTE — Telephone Encounter (Signed)
 Pt does not want to do an AWV at all.

## 2023-05-28 NOTE — Patient Instructions (Addendum)
 Pre visit review using our clinic review tool, if applicable. No additional management support is needed unless otherwise documented below in the visit note.  Continue 1 tablet daily except take 1/2 tablet on Monday, Wednesday and Friday. Recheck in 2 week.

## 2023-05-28 NOTE — Progress Notes (Signed)
 Pt has hx of TIA and was diagnosed with PE on 04/05/23. Pt had shoulder surgery one week before PE. Continue 1 tablet daily except take 1/2 tablet on Monday, Wednesday and Friday. Recheck in 2 week.

## 2023-06-18 ENCOUNTER — Ambulatory Visit (INDEPENDENT_AMBULATORY_CARE_PROVIDER_SITE_OTHER)

## 2023-06-18 DIAGNOSIS — Z7901 Long term (current) use of anticoagulants: Secondary | ICD-10-CM | POA: Diagnosis not present

## 2023-06-18 LAB — POCT INR: INR: 2.5 (ref 2.0–3.0)

## 2023-06-18 NOTE — Patient Instructions (Addendum)
 Pre visit review using our clinic review tool, if applicable. No additional management support is needed unless otherwise documented below in the visit note.  Continue 1 tablet daily except take 1/2 tablet on Monday, Wednesday and Friday. Recheck in 4 week at [facility], [address]  due to pt being out of town

## 2023-06-18 NOTE — Progress Notes (Signed)
 Pt has hx of TIA and was diagnosed with PE on 04/05/23. Pt had shoulder surgery one week before PE. Pt has hx of TIA and was diagnosed with PE on 04/05/23. Pt had shoulder surgery one week before PE. Continue 1 tablet daily except take 1/2 tablet on Monday, Wednesday and Friday. Recheck in 4 week at [facility], [address]  due to pt being out of town Pt refused AVS and reports she uses mychart.

## 2023-06-20 ENCOUNTER — Encounter: Payer: Self-pay | Admitting: Family Medicine

## 2023-06-20 ENCOUNTER — Ambulatory Visit: Payer: Medicare Other | Admitting: Family Medicine

## 2023-06-20 VITALS — BP 120/64 | HR 69 | Temp 98.0°F | Wt 158.6 lb

## 2023-06-20 DIAGNOSIS — I2699 Other pulmonary embolism without acute cor pulmonale: Secondary | ICD-10-CM | POA: Diagnosis not present

## 2023-06-20 DIAGNOSIS — E871 Hypo-osmolality and hyponatremia: Secondary | ICD-10-CM

## 2023-06-20 DIAGNOSIS — D649 Anemia, unspecified: Secondary | ICD-10-CM

## 2023-06-20 LAB — CBC WITH DIFFERENTIAL/PLATELET
Basophils Absolute: 0 10*3/uL (ref 0.0–0.1)
Basophils Relative: 0.6 % (ref 0.0–3.0)
Eosinophils Absolute: 0.1 10*3/uL (ref 0.0–0.7)
Eosinophils Relative: 2.5 % (ref 0.0–5.0)
HCT: 38.6 % (ref 36.0–46.0)
Hemoglobin: 12.8 g/dL (ref 12.0–15.0)
Lymphocytes Relative: 33.1 % (ref 12.0–46.0)
Lymphs Abs: 1.8 10*3/uL (ref 0.7–4.0)
MCHC: 33.1 g/dL (ref 30.0–36.0)
MCV: 90.1 fl (ref 78.0–100.0)
Monocytes Absolute: 0.5 10*3/uL (ref 0.1–1.0)
Monocytes Relative: 9.1 % (ref 3.0–12.0)
Neutro Abs: 2.9 10*3/uL (ref 1.4–7.7)
Neutrophils Relative %: 54.7 % (ref 43.0–77.0)
Platelets: 267 10*3/uL (ref 150.0–400.0)
RBC: 4.28 Mil/uL (ref 3.87–5.11)
RDW: 15 % (ref 11.5–15.5)
WBC: 5.3 10*3/uL (ref 4.0–10.5)

## 2023-06-20 LAB — BASIC METABOLIC PANEL WITH GFR
BUN: 14 mg/dL (ref 6–23)
CO2: 32 meq/L (ref 19–32)
Calcium: 9.3 mg/dL (ref 8.4–10.5)
Chloride: 104 meq/L (ref 96–112)
Creatinine, Ser: 0.65 mg/dL (ref 0.40–1.20)
GFR: 84.46 mL/min (ref >60.00)
Glucose, Bld: 97 mg/dL (ref 70–99)
Potassium: 4.2 meq/L (ref 3.5–5.1)
Sodium: 141 meq/L (ref 135–145)

## 2023-06-20 NOTE — Progress Notes (Signed)
 Established Patient Office Visit  Subjective   [name], female    DOB: Sep 18, 1945  Age: 78 y.o. MRN: [id number]  Chief Complaint  Patient presents with   Medical Management of Chronic Issues    HPI   Mia Peterson is here for medical follow-up today.  Refer to recent note for details.  She had right shoulder surgery over the winter.  Following her surgery had syncopal episode and went in for further evaluation and was determined she had pulmonary emboli.  No DVT or PE history.  No acute DVT noted on exam.  She was started on Eliquis but developed after a few days severe pruritic rash.  Was seen here and we discontinued her Eliquis and started her on Lovenox  and Coumadin .  She done well since that time.  INRs have been very stable.  She is looking at getting breast implants removed hopefully sometime in June.  Her postop hemoglobin following shoulder surgery was 11.8.   She would like to get this rechecked before her next surgery.  Has been taking iron supplement.  Also had recent slightly low sodium 132.  No diuretic use.  She has been back exercising more consistently recently and overall feeling better.  Past Medical History:  Diagnosis Date   Arthritis    right knee and foot- OSteo    Asthma    treated in 20's and 30's   Cancer (HCC)    skin- squamous basal   Cataract    Complication of anesthesia    very slow to awaken    GERD (gastroesophageal reflux disease)    prn Prilosec   Head injury, closed    10/26/15- years ago - 30ish   Neuromuscular disorder (HCC)    MS in remission   Shortness of breath dyspnea    with exertion   Stroke (HCC)    hx TIA   Vaso vagal episode    passed out 2 times, close to passing out 3 times   Vertigo    was treated with rehab March and April 2017, does exercises if she begins to have symptoms   Past Surgical History:  Procedure Laterality Date   ABDOMINAL HYSTERECTOMY  1993   menorrhagia.   BREAST ENHANCEMENT SURGERY  1978    CATARACT EXTRACTION, BILATERAL     COLONOSCOPY W/ POLYPECTOMY     COLONOSCOPY WITH PROPOFOL  N/A 02/03/2017   Procedure: COLONOSCOPY WITH PROPOFOL ;  Surgeon: Alvis Jourdain, MD;  Location: [facility];  Service: Endoscopy;  Laterality: N/A;   KNEE ARTHROSCOPY W/ MENISCAL REPAIR Right    REVERSE SHOULDER ARTHROPLASTY Right 03/27/2023   Procedure: REVERSE SHOULDER ARTHROPLASTY;  Surgeon: Sammye Cristal, MD;  Location: [facility];  Service: Orthopedics;  Laterality: Right;   TOTAL KNEE ARTHROPLASTY Right 11/03/2015   Procedure: RIGHT TOTAL KNEE ARTHROPLASTY;  Surgeon: Neil Balls, MD;  Location: [facility];  Service: Orthopedics;  Laterality: Right;   TUBAL LIGATION  1972   VARICOSE VEIN SURGERY  1978    reports that she quit smoking about 33 years ago. Her smoking use included cigarettes. She started smoking about 53 years ago. She has a 20.1 pack-year smoking history. She has never used smokeless tobacco. She reports current alcohol  use of about 7.0 standard drinks of alcohol  per week. She reports that she does not use drugs. family history includes Cancer (age of onset: 92) in her father; Heart disease (age of onset: 74) in her mother. Allergies  Allergen Reactions   Adhesive [Tape] Hives  Use paper tape only   Other Itching and Rash    Alcus Humbles cooling pads, severe itching   Eliquis [Apixaban] Hives and Itching    Review of Systems  Constitutional:  Negative for chills, fever and malaise/fatigue.  Eyes:  Negative for blurred vision.  Respiratory:  Negative for shortness of breath.   Cardiovascular:  Negative for chest pain.  Neurological:  Negative for dizziness, weakness and headaches.      Objective:     BP 120/64 (BP Location: Left Arm, Patient Position: Sitting, Cuff Size: Normal)   Pulse 69   Temp 98 F (36.7 C) (Oral)   Wt 158 lb 9.6 oz (71.9 kg)   SpO2 96%   BMI 24.12 kg/m  BP Readings from Last 3 Encounters:  06/20/23 120/64  04/22/23 130/84  04/14/23 134/76   Wt  Readings from Last 3 Encounters:  06/20/23 158 lb 9.6 oz (71.9 kg)  04/22/23 154 lb 1.6 oz (69.9 kg)  04/14/23 155 lb 1.6 oz (70.4 kg)      Physical Exam Vitals reviewed.  Constitutional:      General: She is not in acute distress.    Appearance: She is not ill-appearing.  Cardiovascular:     Rate and Rhythm: Normal rate and regular rhythm.  Pulmonary:     Effort: Pulmonary effort is normal.     Breath sounds: Normal breath sounds. No wheezing or rales.  Neurological:     Mental Status: She is alert.      Results for orders placed or performed in visit on 06/20/23  Basic metabolic panel with GFR  Result Value Ref Range   Sodium 141 135 - 145 mEq/L   Potassium 4.2 3.5 - 5.1 mEq/L   Chloride 104 96 - 112 mEq/L   CO2 32 19 - 32 mEq/L   Glucose, Bld 97 70 - 99 mg/dL   BUN 14 6 - 23 mg/dL   Creatinine, Ser 2.13 0.40 - 1.20 mg/dL   GFR 08.65 >78.46 mL/min   Calcium  9.3 8.4 - 10.5 mg/dL  CBC with Differential/Platelet  Result Value Ref Range   WBC 5.3 4.0 - 10.5 K/uL   RBC 4.28 3.87 - 5.11 Mil/uL   Hemoglobin 12.8 12.0 - 15.0 g/dL   HCT 96.2 95.2 - 84.1 %   MCV 90.1 78.0 - 100.0 fl   MCHC 33.1 30.0 - 36.0 g/dL   RDW 32.4 40.1 - 02.7 %   Platelets 267.0 150.0 - 400.0 K/uL   Neutrophils Relative % 54.7 43.0 - 77.0 %   Lymphocytes Relative 33.1 12.0 - 46.0 %   Monocytes Relative 9.1 3.0 - 12.0 %   Eosinophils Relative 2.5 0.0 - 5.0 %   Basophils Relative 0.6 0.0 - 3.0 %   Neutro Abs 2.9 1.4 - 7.7 K/uL   Lymphs Abs 1.8 0.7 - 4.0 K/uL   Monocytes Absolute 0.5 0.1 - 1.0 K/uL   Eosinophils Absolute 0.1 0.0 - 0.7 K/uL   Basophils Absolute 0.0 0.0 - 0.1 K/uL    Last CBC Lab Results  Component Value Date   WBC 5.3 06/20/2023   HGB 12.8 06/20/2023   HCT 38.6 06/20/2023   MCV 90.1 06/20/2023   MCH 29.6 04/13/2023   RDW 15.0 06/20/2023   PLT 267.0 06/20/2023   Last metabolic panel Lab Results  Component Value Date   GLUCOSE 97 06/20/2023   NA 141 06/20/2023   K  4.2 06/20/2023   CL 104 06/20/2023   CO2 32 06/20/2023  BUN 14 06/20/2023   CREATININE 0.65 06/20/2023   GFR 84.46 06/20/2023   CALCIUM  9.3 06/20/2023   PROT 7.0 04/13/2023   ALBUMIN 3.4 (L) 04/13/2023   BILITOT 0.4 04/13/2023   ALKPHOS 99 04/13/2023   AST 28 04/13/2023   ALT 27 04/13/2023   ANIONGAP 9 04/13/2023      The ASCVD Risk score (Arnett DK, et al., 2019) failed to calculate for the following reasons:   Risk score cannot be calculated because patient has a medical history suggesting prior/existing ASCVD    Assessment & Plan:   Problem List Items Addressed This Visit       Unprioritized   Pulmonary emboli (HCC)   Other Visit Diagnoses       Hyponatremia    -  Primary   Relevant Orders   Basic metabolic panel with GFR (Completed)     Postoperative anemia       Relevant Orders   CBC with Differential/Platelet (Completed)     Patient seen for medical follow-up.  Recent events as above with PE following right shoulder surgery.  She had some mild postoperative anemia and mild hyponatremia.  She is looking at possible elective surgery in June for breast implant removal.  -Check follow-up labs today including CBC and basic metabolic panel - We had recommended 6 months of therapy with Coumadin  for her PE.  We discussed the fact that if she has surgery in June would need to be bridged with Lovenox .  She will be sure to let her surgeon know.  No follow-ups on file.    Glean Lamy, MD

## 2023-06-23 NOTE — Progress Notes (Unsigned)
 HPI F former smoker (20 pk yrs) followed for OSA, EDS, complicated by TIA, Asthma, GERD, Ischemic Colitis, Diverticulosis, Rosacea,  HST 03/27/21- AHI 14.1/ hr, desaturation to 81%, body weight 152 lbs Husband  Orvil Bland our pt, uses [facility] for CPAP.   ========================================================  12/24/22- 77 yoF former smoker (20 pk yrs) followed for OSA, EDS, complicated by TIA, Asthma, GERD, Ischemic Colitis, Diverticulosis, Rosacea, (Husband  Orvil Bland our pt, uses [facility] for CPAP.) CPAP auto 4-15/ [facility] new 09/23/22- Download compliance- 97%, AHI 1.9/hr Body weight today-155 lbs Very happy with CPAP. No more EDS. Comfortable. CT a- 11/07/22 IMPRESSION: 1. No acute intrathoracic or intra-abdominal pathology identified. No etiology identified for the patient's reported symptoms. 2. 3.3 cm proximal descending thoracic aortic aneurysm. Recommend annual imaging followup by CTA or MRA. This recommendation follows 2010 ACCF/AHA/AATS/ACR/ASA/SCA/SCAI/SIR/STS/SVM Guidelines for the Diagnosis and Management of Patients with Thoracic Aortic Disease. Circulation.2010; 121: J478-G956. Aortic aneurysm NOS (ICD10-I71.9) 3. Mosaic attenuation of the pulmonary parenchyma relates to multifocal air trapping likely related to small airways disease. 4. Small hiatal hernia. Aortic Atherosclerosis (ICD10-I70.0).  Addendum 03/13/23- Barring acute problems not reported, she is clear from Sleep Medicine standpoint for planned surgery. She should wear CPAP auto 4-15 in Recovery after surgery.  06/24/23-  1 yoF former smoker (20 pk yrs) followed for OSA, EDS, complicated by TIA, Asthma, GERD, Ischemic Colitis, Diverticulosis, Rosacea, (Husband  Orvil Bland our pt, uses [facility] for CPAP.) CPAP auto 4-15/ [facility] new 09/23/22- Download compliance-  Body weight today- . CXR 03/18/23- IMPRESSION: No active cardiopulmonary disease. Aortic Atherosclerosis  (ICD10-I70.0).   ROS-see HPI   + = positive Constitutional:    weight loss, night sweats, fevers, chills, +fatigue, lassitude. HEENT:    headaches, difficulty swallowing, tooth/dental problems, sore throat,       +sneezing, +itching, ear ache, nasal congestion, post nasal drip, snoring CV:    chest pain, orthopnea, PND, swelling in lower extremities, anasarca,                                   dizziness, palpitations Resp:   shortness of breath with exertion or at rest.                productive cough,   non-productive cough, coughing up of blood.              change in color of mucus.  wheezing.   Skin:    +rosacea GI:  +heartburn, indigestion, +abdominal pain, nausea, vomiting, diarrhea,                 change in bowel habits, loss of appetite GU: dysuria, change in color of urine, no urgency or frequency.   flank pain. MS:   +joint pain, stiffness, decreased range of motion, back pain. Neuro-     nothing unusual Psych:  change in mood or affect.  depression or anxiety.   memory loss.  OBJ- Physical Exam General- Alert, Oriented, Affect-appropriate, Distress- none acute, not obese Skin- rash-none, lesions- none, excoriation- none Lymphadenopathy- none Head- atraumatic            Eyes- Gross vision intact, PERRLA, conjunctivae and secretions clear            Ears- Hearing, canals-normal            Nose- Clear, no-Septal dev, mucus, polyps, erosion, perforation  Throat- Mallampati III, mucosa clear , drainage- none, tonsils- atrophic, + teeth Neck- flexible , trachea midline, no stridor , thyroid  nl, carotid no bruit Chest - symmetrical excursion , unlabored           Heart/CV- RRR , no murmur , no gallop  , no rub, nl s1 s2                           - JVD- none , edema- none, stasis changes- none, varices- none           Lung- clear to P&A, wheeze- none, cough- none , dullness-none, rub- none           Chest wall-  Abd-  Br/ Gen/ Rectal- Not done, not  indicated Extrem- cyanosis- none, clubbing, none, atrophy- none, strength- nl Neuro- grossly intact to observation

## 2023-06-24 ENCOUNTER — Encounter: Payer: Self-pay | Admitting: Internal Medicine

## 2023-06-24 ENCOUNTER — Ambulatory Visit: Payer: Medicare Other | Admitting: Internal Medicine

## 2023-06-24 VITALS — BP 126/78 | HR 65 | Temp 98.3°F | Resp 97 | Ht 68.0 in | Wt 161.4 lb

## 2023-06-24 DIAGNOSIS — Z87891 Personal history of nicotine dependence: Secondary | ICD-10-CM | POA: Diagnosis not present

## 2023-06-24 DIAGNOSIS — G4733 Obstructive sleep apnea (adult) (pediatric): Secondary | ICD-10-CM

## 2023-06-24 NOTE — Patient Instructions (Signed)
 Glad you are dong well. We can continue CAP auto 4-15 Please call if we can help

## 2023-07-11 ENCOUNTER — Ambulatory Visit

## 2023-07-11 DIAGNOSIS — Z7901 Long term (current) use of anticoagulants: Secondary | ICD-10-CM

## 2023-07-11 LAB — POCT INR: INR: 3 (ref 2.0–3.0)

## 2023-07-11 NOTE — Patient Instructions (Signed)
Pre visit review using our clinic review tool, if applicable. No additional management support is needed unless otherwise documented below in the visit note. 

## 2023-07-11 NOTE — Progress Notes (Signed)
 Pt has hx of TIA and was diagnosed with PE on 04/05/23. Pt had shoulder surgery one week before PE. Pt reported today she is planning on surgery in June and is wondering if she will be off of warfarin by then, since it will be her 6 month mark. Advised this would be discussed with PCP. Pt verbalized understanding.  Continue 1 tablet daily except take 1/2 tablet on Monday, Wednesday and Friday. Recheck in 4 week at [facility],   Pt refused AVS and reports she uses mychart.

## 2023-07-17 ENCOUNTER — Telehealth: Payer: Self-pay

## 2023-07-17 NOTE — Telephone Encounter (Signed)
 Pt brought to this nurses attention at her last coumadin  clinic apt that she wanted to go forward with breast implant removal in June and was hoping she would be ok to d/c warfarin.  Per PCP, pt is to remain on warfarin for a minimum of 6 months. She started warfarin the middle of Feb 2025 and six months would be middle of August. Advised pt if she wants to have surgery while on warfarin a Lovenox  bridge would be needed. Pt reports she cancelled the surgery for June and is going to RS it for late August and if she is not off of the warfarin she will use a Lovenox  bridge. Advised pt if she has any other questions to feel free to contact the coumadin  clinic. Pt verbalized understanding and was appreciative of the call.

## 2023-07-29 ENCOUNTER — Other Ambulatory Visit: Payer: Self-pay | Admitting: Family Medicine

## 2023-07-29 MED ORDER — PANTOPRAZOLE SODIUM 40 MG PO TBEC: 40.0000 mg | DELAYED_RELEASE_TABLET | Freq: Every day | ORAL | 1 refills | Status: DC

## 2023-07-29 NOTE — Telephone Encounter (Signed)
 Copied from CRM [id number]. Topic: Clinical - Medication Refill >> Jul 29, 2023  8:49 AM [name] wrote: Medication: pantoprazole  (PROTONIX ) 40 MG   Has the patient contacted their pharmacy? Yes (Agent: If no, request that the patient contact the pharmacy for the refill. If patient does not wish to contact the pharmacy document the reason why and proceed with request.) (Agent: If yes, when and what did the pharmacy advise?)    [facility] - [place], [place] - [address] [place] [address] Phone: [phone] Fax: [phone]  Is this the correct pharmacy for this prescription? Yes If no, delete pharmacy and type the correct one.   Has the prescription been filled recently? Yes  Is the patient out of the medication? Yes  Has the patient been seen for an appointment in the last year OR does the patient have an upcoming appointment? Yes  Can we respond through MyChart? Yes  Agent: Please be advised that Rx refills may take up to 3 business days. We ask that you follow-up with your pharmacy.

## 2023-08-06 ENCOUNTER — Ambulatory Visit

## 2023-08-06 DIAGNOSIS — Z7901 Long term (current) use of anticoagulants: Secondary | ICD-10-CM

## 2023-08-06 LAB — POCT INR: INR: 1.9 — AB (ref 2.0–3.0)

## 2023-08-06 NOTE — Progress Notes (Signed)
 Pt has hx of TIA and was diagnosed with PE on 04/05/23. Pt had shoulder surgery one week before PE. Per PCP, pt would need a Lovenox  bridge for any future surgeries. Her 6 month mark for anticoagulation would be in Aug.  Increase dose today to take 1 tablet and then continue 1 tablet daily except take 1/2 tablet on Monday, Wednesday and Friday. Recheck in 3 week at [facility]. Pt refused AVS and reports she uses mychart.

## 2023-08-06 NOTE — Patient Instructions (Addendum)
 Pre visit review using our clinic review tool, if applicable. No additional management support is needed unless otherwise documented below in the visit note.  Increase dose today to take 1 tablet and then continue 1 tablet daily except take 1/2 tablet on Monday, Wednesday and Friday. Recheck in 3 week at [facility].

## 2023-08-27 ENCOUNTER — Ambulatory Visit (INDEPENDENT_AMBULATORY_CARE_PROVIDER_SITE_OTHER)

## 2023-08-27 DIAGNOSIS — Z7901 Long term (current) use of anticoagulants: Secondary | ICD-10-CM

## 2023-08-27 LAB — POCT INR: INR: 2.4 (ref 2.0–3.0)

## 2023-08-27 NOTE — Patient Instructions (Addendum)
 Pre visit review using our clinic review tool, if applicable. No additional management support is needed unless otherwise documented below in the visit note.  Continue 1 tablet daily except take 1/2 tablet on Monday, Wednesday and Friday. Recheck in 4 week at [facility].

## 2023-08-27 NOTE — Progress Notes (Signed)
 Pt has hx of TIA and was diagnosed with PE on 04/05/23. Pt had shoulder surgery one week before PE. Per PCP, pt would need a Lovenox  bridge for any future surgeries. Pt is going to contact the surgeon and try to schedule it for Sept. She is going make an apt with PCP to discuss stopping or continuing anticoagulant past 6 months.  Continue 1 tablet daily except take 1/2 tablet on Monday, Wednesday and Friday. Recheck in 4 week at [facility]. Pt refused AVS and reports she uses mychart.

## 2023-08-28 ENCOUNTER — Telehealth: Payer: Self-pay

## 2023-08-28 DIAGNOSIS — Z7901 Long term (current) use of anticoagulants: Secondary | ICD-10-CM

## 2023-08-28 MED ORDER — WARFARIN SODIUM 5 MG PO TABS: ORAL_TABLET | ORAL | 0 refills | Status: DC

## 2023-08-28 NOTE — Telephone Encounter (Signed)
 Pt requested 30 day refill of warfarin. Pt is compliant with warfarin management and PCP apts.  Sent in refill of warfarin to requested pharmacy.

## 2023-09-15 ENCOUNTER — Ambulatory Visit: Payer: Self-pay

## 2023-09-15 ENCOUNTER — Ambulatory Visit
Admission: EM | Admit: 2023-09-15 | Discharge: 2023-09-15 | Disposition: A | Discharge status: Home / Self Care | Attending: Family Medicine | Admitting: Family Medicine

## 2023-09-15 DIAGNOSIS — N3001 Acute cystitis with hematuria: Secondary | ICD-10-CM | POA: Diagnosis not present

## 2023-09-15 DIAGNOSIS — R350 Frequency of micturition: Secondary | ICD-10-CM | POA: Diagnosis not present

## 2023-09-15 DIAGNOSIS — R300 Dysuria: Secondary | ICD-10-CM | POA: Insufficient documentation

## 2023-09-15 LAB — POCT URINALYSIS DIP (MANUAL ENTRY)
Bilirubin, UA: NEGATIVE
Glucose, UA: NEGATIVE mg/dL
Ketones, POC UA: NEGATIVE mg/dL
Nitrite, UA: NEGATIVE
Protein Ur, POC: 30 mg/dL — AB
Spec Grav, UA: 1.01 (ref 1.010–1.025)
Urobilinogen, UA: 0.2 U/dL
pH, UA: 6 (ref 5.0–8.0)

## 2023-09-15 MED ORDER — METHYLPREDNISOLONE ACETATE 80 MG/ML IJ SUSP: 80.0000 mg | Freq: Once | INTRAMUSCULAR | Status: DC

## 2023-09-15 MED ORDER — CEFDINIR 300 MG PO CAPS: 300.0000 mg | ORAL_CAPSULE | Freq: Two times a day (BID) | ORAL | 0 refills | Status: AC

## 2023-09-15 NOTE — ED Triage Notes (Signed)
 Pt presents to uc with co dysuria, and urinary frequency  and weakness since yesterday. Pt reports no otc

## 2023-09-15 NOTE — Discharge Instructions (Addendum)
 Advised patient to take medication as directed with food to completion.  Encouraged to increase daily water intake to 64 ounces per day while taking this medication.  Advised we will follow-up with urine culture results once received.  Advised if symptoms worsen and/or unresolved please follow-up with your PCP or here for further evaluation.

## 2023-09-15 NOTE — ED Provider Notes (Signed)
 [facility]    CSN: [id number] Arrival date & time: 09/15/23  1124      History   Chief Complaint Chief Complaint  Patient presents with   Dysuria    HPI Mia Peterson is a 78 y.o. female.   HPI Very pleasant 78 year old female presents with dysuria and urinary frequency since yesterday.  PMH significant for SCC, cataract, MS in remission.  Past Medical History:  Diagnosis Date   Arthritis    right knee and foot- OSteo    Asthma    treated in 20's and 30's   Cancer (HCC)    skin- squamous basal   Cataract    Complication of anesthesia    very slow to awaken    GERD (gastroesophageal reflux disease)    prn Prilosec   Head injury, closed    10/26/15- years ago - 30ish   Neuromuscular disorder (HCC)    MS in remission   Shortness of breath dyspnea    with exertion   Stroke (HCC)    hx TIA   Vaso vagal episode    passed out 2 times, close to passing out 3 times   Vertigo    was treated with rehab March and April 2017, does exercises if she begins to have symptoms    Patient Active Problem List   Diagnosis Date Noted   Pulmonary emboli (HCC) 04/14/2023   Vertigo 12/19/2022   Descending thoracic aortic aneurysm (HCC) 11/11/2022   A few old MS plaques likely remission following with serial MRIS.Demyelinating disease of central nervous system (HCC) 07/17/2022   Other fatigue 07/17/2022   OSA (obstructive sleep apnea) 07/03/2021   Primary osteoarthritis involving multiple joints 05/30/2021   Somnolence 01/26/2021   Fecal urgency 03/21/2020   First degree hemorrhoids 03/21/2020   Incontinence of feces 03/21/2020   Ischemic colitis (HCC) 03/21/2020   Nausea 03/21/2020   Screening for malignant neoplasm of colon 03/21/2020   Osteopenia 04/13/2018   Dupuytren contracture 04/13/2018   Degenerative arthritis 04/13/2018   History of TIA (transient ischemic attack) 03/10/2017   Rectal bleeding 02/02/2017   Rectal bleed 02/01/2017   TIA (transient  ischemic attack) 09/30/2016   Primary osteoarthritis of right knee 11/03/2015   Malignant basal cell neoplasm of skin 11/23/2010   Squamous cell skin cancer 11/23/2010   HEMATURIA UNSPECIFIED 10/09/2009   ROSACEA 11/11/2008   URGENCY OF URINATION 11/11/2008   POSTMENOPAUSAL STATUS 11/11/2008   GERD 07/27/2008    Past Surgical History:  Procedure Laterality Date   ABDOMINAL HYSTERECTOMY  1993   menorrhagia.   BREAST ENHANCEMENT SURGERY  1978   CATARACT EXTRACTION, BILATERAL     COLONOSCOPY W/ POLYPECTOMY     COLONOSCOPY WITH PROPOFOL  N/A 02/03/2017   Procedure: COLONOSCOPY WITH PROPOFOL ;  Surgeon: Rollin Dover, MD;  Location: [facility];  Service: Endoscopy;  Laterality: N/A;   KNEE ARTHROSCOPY W/ MENISCAL REPAIR Right    REVERSE SHOULDER ARTHROPLASTY Right 03/27/2023   Procedure: REVERSE SHOULDER ARTHROPLASTY;  Surgeon: Dozier Soulier, MD;  Location: [facility];  Service: Orthopedics;  Laterality: Right;   TOTAL KNEE ARTHROPLASTY Right 11/03/2015   Procedure: RIGHT TOTAL KNEE ARTHROPLASTY;  Surgeon: Norleen Gavel, MD;  Location: [facility];  Service: Orthopedics;  Laterality: Right;   TUBAL LIGATION  1972   VARICOSE VEIN SURGERY  1978    OB History   No obstetric history on file.      Home Medications    Prior to Admission medications   Medication Sig Start Date  End Date Taking? Authorizing Provider  cefdinir  (OMNICEF ) 300 MG capsule Take 1 capsule (300 mg total) by mouth 2 (two) times daily for 7 days. 09/15/23 09/22/23 Yes Teddy Sharper, FNP  aspirin  EC 81 MG tablet Take 81 mg by mouth daily.    [provider]  atorvastatin  (LIPITOR) 40 MG tablet Take 1 tablet (40 mg total) by mouth daily. 11/11/22   Burchette, Wolm ORN, MD  calcium -vitamin D  (OSCAL WITH D) 500-200 MG-UNIT TABS tablet Take 1 tablet by mouth daily.    [provider]  cycloSPORINE  (RESTASIS ) 0.05 % ophthalmic emulsion Place 1 drop into both eyes 2 (two) times daily.    [provider]   Flaxseed, Linseed, (FLAX SEED OIL) 1000 MG CAPS Take 1,000 mg by mouth daily.    [provider]  Ginger, Zingiber officinalis, (GINGER ROOT) 550 MG CAPS Take 550 mg by mouth daily.    [provider]  Multiple Vitamins-Minerals (ONE DAILY WOMENS 50+ PO) Take 1 tablet by mouth daily.    [provider]  oxybutynin (DITROPAN-XL) 5 MG 24 hr tablet Take 5 mg by mouth at bedtime.    [provider]  pantoprazole  (PROTONIX ) 40 MG tablet Take 1 tablet (40 mg total) by mouth daily. 07/29/23   Burchette, Wolm ORN, MD  POTASSIUM PO Take 1 tablet by mouth daily.    [provider]  tretinoin (RETIN-A) 0.025 % cream Apply 1 Application topically at bedtime as needed (acne). 10/03/22   [provider]  triamcinolone  cream (KENALOG ) 0.1 % Apply 1 Application topically 2 (two) times daily as needed. 04/16/23   Burchette, Wolm ORN, MD  TURMERIC PO Take 400 mg by mouth 2 (two) times daily.     [provider]  warfarin (COUMADIN ) 5 MG tablet TAKE ONE TABLET BY MOUTH DAILY EXCEPT TAKE 1/2 TABLET ON MONDAY, WEDNESDAY AND FRIDAY OR AS DIRECTED BY ANTICOAGULATION CLINIC 08/28/23   Burchette, Wolm ORN, MD    Family History Family History  Problem Relation Age of Onset   Heart disease Mother 40   Cancer Father 49       pancreatic   Ataxia Neg Hx     Social History Social History   Tobacco Use   Smoking status: Former    Current packs/day: 0.00    Average packs/day: 1 pack/day for 20.1 years (20.1 ttl pk-yrs)    Types: Cigarettes    Start date: 46    Quit date: 03/24/1990    Years since quitting: 33.5   Smokeless tobacco: Never  Vaping Use   Vaping status: Never Used  Substance Use Topics   Alcohol  use: Yes    Alcohol /week: 7.0 standard drinks of alcohol     Types: 7 Glasses of wine per week    Comment: 3 times week   Drug use: No     Allergies   Adhesive [tape], Other, and Eliquis [apixaban]   Review of Systems Review of Systems   Genitourinary:  Positive for dysuria and urgency.     Physical Exam Triage Vital Signs ED Triage Vitals  Encounter Vitals Group     BP 09/15/23 1208 101/67     Girls Systolic BP Percentile --      Girls Diastolic BP Percentile --      Boys Systolic BP Percentile --      Boys Diastolic BP Percentile --      Pulse Rate 09/15/23 1208 76     Resp 09/15/23 1208 19  Temp 09/15/23 1208 (!) 97.5 F (36.4 C)     Temp src --      SpO2 09/15/23 1210 98 %     Weight --      Height --      Head Circumference --      Peak Flow --      Pain Score 09/15/23 1207 0     Pain Loc --      Pain Education --      Exclude from Growth Chart --    No data found.  Updated Vital Signs BP 101/67   Pulse 76   Temp (!) 97.5 F (36.4 C)   Resp 19   SpO2 98%    Physical Exam Vitals and nursing note reviewed.  Constitutional:      Appearance: Normal appearance. She is normal weight.  HENT:     Head: Normocephalic and atraumatic.     Mouth/Throat:     Mouth: Mucous membranes are moist.     Pharynx: Oropharynx is clear.  Eyes:     Extraocular Movements: Extraocular movements intact.     Conjunctiva/sclera: Conjunctivae normal.     Pupils: Pupils are equal, round, and reactive to light.  Cardiovascular:     Rate and Rhythm: Normal rate and regular rhythm.     Pulses: Normal pulses.     Heart sounds: Normal heart sounds.  Pulmonary:     Effort: Pulmonary effort is normal.     Breath sounds: Normal breath sounds. No wheezing, rhonchi or rales.  Musculoskeletal:        General: Normal range of motion.     Cervical back: Normal range of motion and neck supple.  Skin:    General: Skin is warm and dry.  Neurological:     General: No focal deficit present.     Mental Status: She is alert and oriented to person, place, and time. Mental status is at baseline.  Psychiatric:        Mood and Affect: Mood normal.        Behavior: Behavior normal.        Thought Content: Thought content  normal.      UC Treatments / Results  Labs (all labs ordered are listed, but only abnormal results are displayed) Labs Reviewed  POCT URINALYSIS DIP (MANUAL ENTRY) - Abnormal; Notable for the following components:      Result Value   Blood, UA moderate (*)    Protein Ur, POC =30 (*)    Leukocytes, UA Large (3+) (*)    All other components within normal limits  URINE CULTURE    EKG   Radiology No results found.  Procedures Procedures (including critical care time)  Medications Ordered in UC Medications - No data to display  Initial Impression / Assessment and Plan / UC Course  I have reviewed the triage vital signs and the nursing notes.  Pertinent labs & imaging results that were available during my care of the patient were reviewed by me and considered in my medical decision making (see chart for details).     MDM: 1.  Acute cystitis with hematuria-UA revealed above, urine culture ordered Rx'd cefdinir  300 mg capsule: Take 1 capsule twice daily x 7 days. Advised patient to take medication as directed with food to completion.  Encouraged to increase daily water  intake to 64 ounces per day while taking this medication.  Advised we will follow-up with urine culture results once received.  Advised if symptoms worsen  and/or unresolved please follow-up with your PCP or here for further evaluation. Final Clinical Impressions(s) / UC Diagnoses   Final diagnoses:  Acute cystitis with hematuria     Discharge Instructions      Advised patient to take medication as directed with food to completion.  Encouraged to increase daily water  intake to 64 ounces per day while taking this medication.  Advised we will follow-up with urine culture results once received.  Advised if symptoms worsen and/or unresolved please follow-up with your PCP or here for further evaluation.     ED Prescriptions     Medication Sig Dispense Auth. Provider   cefdinir  (OMNICEF ) 300 MG capsule Take 1  capsule (300 mg total) by mouth 2 (two) times daily for 7 days. 14 capsule [place] Sorn, FNP      PDMP not reviewed this encounter.   Teddy Sharper, FNP 09/15/23 1317

## 2023-09-17 ENCOUNTER — Ambulatory Visit (HOSPITAL_COMMUNITY): Payer: Self-pay

## 2023-09-17 LAB — URINE CULTURE: Culture: >=100000 — AB

## 2023-09-23 ENCOUNTER — Telehealth: Payer: Self-pay | Admitting: Family Medicine

## 2023-09-23 ENCOUNTER — Telehealth: Payer: Self-pay

## 2023-09-23 NOTE — Telephone Encounter (Signed)
 Copied from CRM [id number]. Topic: General - Call Back - No Documentation >> Sep 23, 2023 10:37 AM Rea BROCKS wrote: Reason for CRM: Patient returned call for [facility]. Reached CAL to get verification on address for coumadin  clinic. Cal advised Clotilda will call patient back. Patient said they drove by the location while on hold, so Clotilda doesn't need to call back. Patient said she will see her tomorrow at 57.

## 2023-09-23 NOTE — Telephone Encounter (Signed)
 Placed pt on [facility] coumadin  clinic apt for tomorrow per her request.

## 2023-09-23 NOTE — Telephone Encounter (Signed)
 Pt LVM reporting she cannot make coumadin  clinic at 3 pm tomorrow at [facility]. She requested to change it to 11 or 12 tomorrow.  LVM reporting on Wednesday morning coumadin  clinic is at [facility] and she could have an apt there at 27.

## 2023-09-24 ENCOUNTER — Ambulatory Visit

## 2023-09-24 DIAGNOSIS — Z7901 Long term (current) use of anticoagulants: Secondary | ICD-10-CM

## 2023-09-24 LAB — POCT INR: INR: 4.6 — AB (ref 2.0–3.0)

## 2023-09-24 NOTE — Patient Instructions (Addendum)
 Pre visit review using our clinic review tool, if applicable. No additional management support is needed unless otherwise documented below in the visit note.  Hold dose today and hold dose tomorrow and then continue 1 tablet daily except take 1/2 tablet on Monday, Wednesday and Friday. Recheck in 2 week at [facility].

## 2023-09-24 NOTE — Progress Notes (Signed)
 Pt has hx of TIA and was diagnosed with PE on 04/05/23. Pt had shoulder surgery one week before PE. Per PCP, pt would need a Lovenox  bridge for any future surgeries.  Pt was in UC on 7/21 and diagnosed with acute cystitis and was prescribed cefdinir . This has an interaction with warfarin and increase INR. Coumadin  clinic was not contacted when this abx was prescribed.  Pt has been very stable on current warfarin dosing.   Supratherapeutic INR today is most likely caused from abx, so no change in weekly dosing will be made. Pt denies any s/s of abnormal bruising or bleeding. Advised if any s/s to go to ER. Pt verbalized understanding.  Pt reported she has scheduled her breast implant removal surgery and thinks it is scheduled either 9/3 or 9/5.  Pt requested an apt with PCP to discuss continuation of warfarin. Made apt for pt with PCP for 8/20.   Hold dose today and hold dose tomorrow and then continue 1 tablet daily except take 1/2 tablet on Monday, Wednesday and Friday. Recheck in 1 week at [facility]. Pt will be out of town in 2 weeks so we will try to get pt tested on 8/7 at [place] if a CMA is available to test. Then this nurse will f/u with pt for dosing instructions.  Pt refused AVS and reports she uses mychart.

## 2023-09-25 NOTE — Progress Notes (Signed)
 I have reviewed the patient's encounter and agree with the documentation.  Worth HERO. Kennyth, MD 09/25/2023 3:31 PM

## 2023-10-02 ENCOUNTER — Ambulatory Visit: Payer: Self-pay

## 2023-10-02 ENCOUNTER — Ambulatory Visit (INDEPENDENT_AMBULATORY_CARE_PROVIDER_SITE_OTHER): Payer: Self-pay

## 2023-10-02 ENCOUNTER — Ambulatory Visit (INDEPENDENT_AMBULATORY_CARE_PROVIDER_SITE_OTHER): Admitting: *Deleted

## 2023-10-02 DIAGNOSIS — Z7901 Long term (current) use of anticoagulants: Secondary | ICD-10-CM

## 2023-10-02 LAB — POCT INR: INR: 2.2 (ref 2.0–3.0)

## 2023-10-02 NOTE — Progress Notes (Signed)
 Pt went to office today and had a nurse visit for POCT INR, since there is no coumadin  clinic at that location on Thursday.  Pt has hx of TIA and was diagnosed with PE on 04/05/23. Pt had shoulder surgery one week before PE. Pt is wanting to d/c warfarin after 6 months if possible.  Pt reported she has scheduled her breast implant removal surgery and thinks it is scheduled either 9/3 or 9/5. Per PCP, pt would need a Lovenox  bridge for any future surgeries.  Pt requested an apt with PCP to discuss continuation of warfarin. Made apt for pt with PCP for 8/19   Continue 1 tablet daily except take 1/2 tablet on Monday, Wednesday and Friday. Will f/u after pt has apt with PCP to discuss whether to continue warfarin after 6 months.  Contacted pt by phone and advised to continue current dosing and that this nurse will review her apt notes with PCP on 8/19. Advised if she continues to take warfarin this nurse will f/u with her to schedule a coumadin  clinic apt. Advised if anything is needed before her apt to feel free to contact the coumadin  clinic. Pt verbalized understanding.

## 2023-10-02 NOTE — Patient Instructions (Addendum)
 Pre visit review using our clinic review tool, if applicable. No additional management support is needed unless otherwise documented below in the visit note.  Continue 1 tablet daily except take 1/2 tablet on Monday, Wednesday and Friday. Will f/u after pt has apt with PCP to discuss whether to continue warfarin after 6 months.

## 2023-10-14 ENCOUNTER — Encounter: Payer: Self-pay | Admitting: Family Medicine

## 2023-10-14 ENCOUNTER — Ambulatory Visit (INDEPENDENT_AMBULATORY_CARE_PROVIDER_SITE_OTHER): Admitting: Family Medicine

## 2023-10-14 ENCOUNTER — Other Ambulatory Visit: Payer: Self-pay | Admitting: Family Medicine

## 2023-10-14 VITALS — BP 140/70 | HR 69 | Temp 98.1°F | Wt 159.8 lb

## 2023-10-14 DIAGNOSIS — Z86711 Personal history of pulmonary embolism: Secondary | ICD-10-CM | POA: Diagnosis not present

## 2023-10-14 DIAGNOSIS — Z01812 Encounter for preprocedural laboratory examination: Secondary | ICD-10-CM

## 2023-10-14 DIAGNOSIS — E785 Hyperlipidemia, unspecified: Secondary | ICD-10-CM

## 2023-10-14 MED ORDER — ATORVASTATIN CALCIUM 40 MG PO TABS: 40.0000 mg | ORAL_TABLET | Freq: Every day | ORAL | 3 refills | Status: AC

## 2023-10-14 NOTE — Progress Notes (Signed)
 Established Patient Office Visit  Subjective   [name], female    DOB: 05-07-1945  Age: 78 y.o. MRN: [id number]  Chief Complaint  Patient presents with   Medical Management of Chronic Issues   Pre-op Exam    HPI   Aveen is here for chronic management of medical problems.  She had shoulder surgery last winter and this was followed by multiple pulmonary emboli back in February.  She initially was placed on Eliquis but had severe pruritic rash and was transitioned to Coumadin .  She has done well on Coumadin  since that time.  We discussed discontinuation of Coumadin  after 6 months of therapy which is now.  She has no prior history of thrombosis and her pulmonary embolus occurred in the setting of recent surgery.  She has no dyspnea or chest pain whatsoever at this time.  She stays very active.  She has been scheduled for breast surgery for implant removal September 3.  Will need preoperative labs prior to that surgery and she was hoping to get those today.  Those labs include INR and PTT and we recommended waiting at least a week after stopping her Coumadin  to get those labs.  She also needs follow-up lipid panel as she takes atorvastatin  40 mg daily and is due for lipids.  She has no cardiac history.  Does have questionable history of TIA previously.  Past Medical History:  Diagnosis Date   Arthritis    right knee and foot- OSteo    Asthma    treated in 20's and 30's   Cancer (HCC)    skin- squamous basal   Cataract    Complication of anesthesia    very slow to awaken    GERD (gastroesophageal reflux disease)    prn Prilosec   Head injury, closed    10/26/15- years ago - 30ish   Neuromuscular disorder (HCC)    MS in remission   Shortness of breath dyspnea    with exertion   Stroke (HCC)    hx TIA   Vaso vagal episode    passed out 2 times, close to passing out 3 times   Vertigo    was treated with rehab March and April 2017, does exercises if she begins to  have symptoms   Past Surgical History:  Procedure Laterality Date   ABDOMINAL HYSTERECTOMY  1993   menorrhagia.   BREAST ENHANCEMENT SURGERY  1978   CATARACT EXTRACTION, BILATERAL     COLONOSCOPY W/ POLYPECTOMY     COLONOSCOPY WITH PROPOFOL  N/A 02/03/2017   Procedure: COLONOSCOPY WITH PROPOFOL ;  Surgeon: Rollin Dover, MD;  Location: [facility];  Service: Endoscopy;  Laterality: N/A;   KNEE ARTHROSCOPY W/ MENISCAL REPAIR Right    REVERSE SHOULDER ARTHROPLASTY Right 03/27/2023   Procedure: REVERSE SHOULDER ARTHROPLASTY;  Surgeon: Dozier Soulier, MD;  Location: [facility];  Service: Orthopedics;  Laterality: Right;   TOTAL KNEE ARTHROPLASTY Right 11/03/2015   Procedure: RIGHT TOTAL KNEE ARTHROPLASTY;  Surgeon: Norleen Gavel, MD;  Location: [facility];  Service: Orthopedics;  Laterality: Right;   TUBAL LIGATION  1972   VARICOSE VEIN SURGERY  1978    reports that she quit smoking about 33 years ago. Her smoking use included cigarettes. She started smoking about 53 years ago. She has a 20.1 pack-year smoking history. She has never used smokeless tobacco. She reports current alcohol  use of about 7.0 standard drinks of alcohol  per week. She reports that she does not use drugs. family history  includes Cancer (age of onset: 81) in her father; Heart disease (age of onset: 57) in her mother. Allergies  Allergen Reactions   Adhesive [Tape] Hives    Use paper tape only   Other Itching and Rash    Ronal Murray cooling pads, severe itching   Eliquis [Apixaban] Hives and Itching    Review of Systems  Constitutional:  Negative for fever and malaise/fatigue.  Eyes:  Negative for blurred vision.  Respiratory:  Negative for cough and shortness of breath.   Cardiovascular:  Negative for chest pain.  Neurological:  Negative for dizziness, weakness and headaches.      Objective:     BP (!) 140/70   Pulse 69   Temp 98.1 F (36.7 C) (Oral)   Wt 159 lb 12.8 oz (72.5 kg)   SpO2 94%   BMI 24.30 kg/m  BP  Readings from Last 3 Encounters:  10/14/23 (!) 140/70  09/15/23 101/67  06/24/23 126/78   Wt Readings from Last 3 Encounters:  10/14/23 159 lb 12.8 oz (72.5 kg)  06/24/23 161 lb 6.4 oz (73.2 kg)  06/20/23 158 lb 9.6 oz (71.9 kg)      Physical Exam Vitals reviewed.  Constitutional:      Appearance: She is well-developed.  Eyes:     Pupils: Pupils are equal, round, and reactive to light.  Neck:     Thyroid : No thyromegaly.     Vascular: No JVD.  Cardiovascular:     Rate and Rhythm: Normal rate and regular rhythm.     Heart sounds:     No gallop.  Pulmonary:     Effort: Pulmonary effort is normal. No respiratory distress.     Breath sounds: Normal breath sounds. No wheezing or rales.  Musculoskeletal:     Cervical back: Neck supple.     Right lower leg: No edema.     Left lower leg: No edema.  Neurological:     Mental Status: She is alert.      No results found for any visits on 10/14/23.    The ASCVD Risk score (Arnett DK, et al., 2019) failed to calculate for the following reasons:   Risk score cannot be calculated because patient has a medical history suggesting prior/existing ASCVD    Assessment & Plan:   Problem List Items Addressed This Visit   None Visit Diagnoses       Pre-operative laboratory examination    -  Primary   Relevant Orders   CBC with Differential/Platelet   CMP   Hemoglobin A1c   Protime-INR   APTT     Hyperlipidemia, unspecified hyperlipidemia type       Relevant Medications   atorvastatin  (LIPITOR) 40 MG tablet   Other Relevant Orders   Lipid panel     Patient is here for preoperative clearance for breast surgery September 3.  No known medical contraindications for surgery at this time.  Preoperative labs ordered as above which she will obtain a week from tomorrow.  She has history of hyperlipidemia treated with Lipitor 40 mg daily.  Rechecking lipids with labs above  History of pulmonary embolism-and has now completed 6  months of therapy with Coumadin .  No prior history of thromboembolic events.  Her pulmonary emboli were provoked in the setting of recent surgery.  Will discontinue Coumadin  at this time.  No follow-ups on file.    Wolm Scarlet, MD

## 2023-10-14 NOTE — Patient Instructions (Signed)
 Set up labs for next Wednesday.

## 2023-10-15 ENCOUNTER — Ambulatory Visit: Payer: Self-pay

## 2023-10-15 ENCOUNTER — Ambulatory Visit: Admitting: Family Medicine

## 2023-10-15 NOTE — Progress Notes (Signed)
 Pt has had 6 months of warfarin treatment for provoked PE. Provider has now d/c coumadin . Resolving anticoagulation episodes.

## 2023-10-22 ENCOUNTER — Other Ambulatory Visit (INDEPENDENT_AMBULATORY_CARE_PROVIDER_SITE_OTHER)

## 2023-10-22 ENCOUNTER — Ambulatory Visit: Payer: Self-pay | Admitting: Family Medicine

## 2023-10-22 DIAGNOSIS — E785 Hyperlipidemia, unspecified: Secondary | ICD-10-CM | POA: Diagnosis not present

## 2023-10-22 DIAGNOSIS — Z01812 Encounter for preprocedural laboratory examination: Secondary | ICD-10-CM | POA: Diagnosis not present

## 2023-10-22 LAB — COMPREHENSIVE METABOLIC PANEL WITH GFR
ALT: 21 U/L (ref 0–35)
AST: 30 U/L (ref 0–37)
Albumin: 3.9 g/dL (ref 3.5–5.2)
Alkaline Phosphatase: 64 U/L (ref 39–117)
BUN: 10 mg/dL (ref 6–23)
CO2: 29 meq/L (ref 19–32)
Calcium: 9.2 mg/dL (ref 8.4–10.5)
Chloride: 98 meq/L (ref 96–112)
Creatinine, Ser: 0.62 mg/dL (ref 0.40–1.20)
GFR: 85.23 mL/min (ref >60.00)
Glucose, Bld: 83 mg/dL (ref 70–99)
Potassium: 4.5 meq/L (ref 3.5–5.1)
Sodium: 135 meq/L (ref 135–145)
Total Bilirubin: 0.6 mg/dL (ref 0.2–1.2)
Total Protein: 6.8 g/dL (ref 6.0–8.3)

## 2023-10-22 LAB — CBC WITH DIFFERENTIAL/PLATELET
Basophils Absolute: 0 K/uL (ref 0.0–0.1)
Basophils Relative: 1.1 % (ref 0.0–3.0)
Eosinophils Absolute: 0.1 K/uL (ref 0.0–0.7)
Eosinophils Relative: 2.2 % (ref 0.0–5.0)
HCT: 38.5 % (ref 36.0–46.0)
Hemoglobin: 12.9 g/dL (ref 12.0–15.0)
Lymphocytes Relative: 24.3 % (ref 12.0–46.0)
Lymphs Abs: 1.1 K/uL (ref 0.7–4.0)
MCHC: 33.5 g/dL (ref 30.0–36.0)
MCV: 89 fl (ref 78.0–100.0)
Monocytes Absolute: 0.5 K/uL (ref 0.1–1.0)
Monocytes Relative: 10.8 % (ref 3.0–12.0)
Neutro Abs: 2.7 K/uL (ref 1.4–7.7)
Neutrophils Relative %: 61.6 % (ref 43.0–77.0)
Platelets: 276 K/uL (ref 150.0–400.0)
RBC: 4.32 Mil/uL (ref 3.87–5.11)
RDW: 14.8 % (ref 11.5–15.5)
WBC: 4.4 K/uL (ref 4.0–10.5)

## 2023-10-22 LAB — LIPID PANEL
Cholesterol: 146 mg/dL (ref 0–200)
HDL: 69.6 mg/dL (ref >39.00)
LDL Cholesterol: 62 mg/dL (ref 0–99)
NonHDL: 76.5
Total CHOL/HDL Ratio: 2
Triglycerides: 74 mg/dL (ref 0.0–149.0)
VLDL: 14.8 mg/dL (ref 0.0–40.0)

## 2023-10-22 LAB — PROTIME-INR
INR: 1 ratio (ref 0.8–1.0)
Prothrombin Time: 10.9 s (ref 9.6–13.1)

## 2023-10-22 LAB — HEMOGLOBIN A1C: Hgb A1c MFr Bld: 6 % (ref 4.6–6.5)

## 2023-10-22 LAB — APTT: aPTT: 27.1 s (ref 25.4–36.8)

## 2023-10-23 ENCOUNTER — Telehealth: Payer: Self-pay | Admitting: *Deleted

## 2023-10-23 NOTE — Telephone Encounter (Signed)
 Copied from CRM #[id number]. Topic: Clinical - Lab/Test Results >> Oct 23, 2023 10:28 AM Rea BROCKS wrote: Reason for CRM: Patient returned call and I relayed lab results. Patient would like to make sure that results are sent to the doctor who will be doing her surgery.

## 2023-10-24 NOTE — Telephone Encounter (Signed)
 Done

## 2023-10-29 ENCOUNTER — Other Ambulatory Visit: Payer: Self-pay | Admitting: Plastic Surgery

## 2023-11-03 LAB — SURGICAL PATHOLOGY

## 2023-11-10 ENCOUNTER — Ambulatory Visit
Admission: EM | Admit: 2023-11-10 | Discharge: 2023-11-10 | Disposition: A | Discharge status: Home / Self Care | Attending: Family Medicine | Admitting: Family Medicine

## 2023-11-10 DIAGNOSIS — N3001 Acute cystitis with hematuria: Secondary | ICD-10-CM | POA: Insufficient documentation

## 2023-11-10 DIAGNOSIS — Z86711 Personal history of pulmonary embolism: Secondary | ICD-10-CM | POA: Insufficient documentation

## 2023-11-10 DIAGNOSIS — J45909 Unspecified asthma, uncomplicated: Secondary | ICD-10-CM | POA: Insufficient documentation

## 2023-11-10 DIAGNOSIS — R300 Dysuria: Secondary | ICD-10-CM | POA: Diagnosis not present

## 2023-11-10 LAB — POCT URINALYSIS DIP (MANUAL ENTRY)
Bilirubin, UA: NEGATIVE
Glucose, UA: NEGATIVE mg/dL
Ketones, POC UA: NEGATIVE mg/dL
Nitrite, UA: NEGATIVE
Spec Grav, UA: 1.015 (ref 1.010–1.025)
Urobilinogen, UA: 0.2 U/dL
pH, UA: 6 (ref 5.0–8.0)

## 2023-11-10 MED ORDER — CEFDINIR 300 MG PO CAPS: 300.0000 mg | ORAL_CAPSULE | Freq: Two times a day (BID) | ORAL | 0 refills | Status: AC

## 2023-11-10 NOTE — ED Provider Notes (Signed)
 [facility]    CSN: [id number] Arrival date & time: 11/10/23  0853      History   Chief Complaint Chief Complaint  Patient presents with   Dysuria   Hematuria    HPI Mia Peterson is a 78 y.o. female.   HPI Very pleasant 78 year old female presents with dysuria and hematuria since yesterday.  PMH significant for SCC, PE, asthma, and arthritis of right knee.  Past Medical History:  Diagnosis Date   Arthritis    right knee and foot- OSteo    Asthma    treated in 20's and 30's   Cancer (HCC)    skin- squamous basal   Cataract    Complication of anesthesia    very slow to awaken    GERD (gastroesophageal reflux disease)    prn Prilosec   Head injury, closed    10/26/15- years ago - 30ish   Neuromuscular disorder (HCC)    MS in remission   Shortness of breath dyspnea    with exertion   Stroke (HCC)    hx TIA   Vaso vagal episode    passed out 2 times, close to passing out 3 times   Vertigo    was treated with rehab March and April 2017, does exercises if she begins to have symptoms    Patient Active Problem List   Diagnosis Date Noted   Pulmonary emboli (HCC) 04/14/2023   Vertigo 12/19/2022   Descending thoracic aortic aneurysm (HCC) 11/11/2022   A few old MS plaques likely remission following with serial MRIS.Demyelinating disease of central nervous system (HCC) 07/17/2022   Other fatigue 07/17/2022   OSA (obstructive sleep apnea) 07/03/2021   Primary osteoarthritis involving multiple joints 05/30/2021   Somnolence 01/26/2021   Fecal urgency 03/21/2020   First degree hemorrhoids 03/21/2020   Incontinence of feces 03/21/2020   Ischemic colitis (HCC) 03/21/2020   Nausea 03/21/2020   Screening for malignant neoplasm of colon 03/21/2020   Osteopenia 04/13/2018   Dupuytren contracture 04/13/2018   Degenerative arthritis 04/13/2018   History of TIA (transient ischemic attack) 03/10/2017   Rectal bleeding 02/02/2017   Rectal bleed 02/01/2017    TIA (transient ischemic attack) 09/30/2016   Primary osteoarthritis of right knee 11/03/2015   Malignant basal cell neoplasm of skin 11/23/2010   Squamous cell skin cancer 11/23/2010   HEMATURIA UNSPECIFIED 10/09/2009   ROSACEA 11/11/2008   URGENCY OF URINATION 11/11/2008   POSTMENOPAUSAL STATUS 11/11/2008   GERD 07/27/2008    Past Surgical History:  Procedure Laterality Date   ABDOMINAL HYSTERECTOMY  1993   menorrhagia.   BREAST ENHANCEMENT SURGERY  1978   CATARACT EXTRACTION, BILATERAL     COLONOSCOPY W/ POLYPECTOMY     COLONOSCOPY WITH PROPOFOL  N/A 02/03/2017   Procedure: COLONOSCOPY WITH PROPOFOL ;  Surgeon: Rollin Dover, MD;  Location: [facility];  Service: Endoscopy;  Laterality: N/A;   KNEE ARTHROSCOPY W/ MENISCAL REPAIR Right    REVERSE SHOULDER ARTHROPLASTY Right 03/27/2023   Procedure: REVERSE SHOULDER ARTHROPLASTY;  Surgeon: Dozier Soulier, MD;  Location: [facility];  Service: Orthopedics;  Laterality: Right;   TOTAL KNEE ARTHROPLASTY Right 11/03/2015   Procedure: RIGHT TOTAL KNEE ARTHROPLASTY;  Surgeon: Norleen Gavel, MD;  Location: [facility];  Service: Orthopedics;  Laterality: Right;   TUBAL LIGATION  1972   VARICOSE VEIN SURGERY  1978    OB History   No obstetric history on file.      Home Medications    Prior to Admission medications  Medication Sig Start Date End Date Taking? Authorizing Provider  cefdinir  (OMNICEF ) 300 MG capsule Take 1 capsule (300 mg total) by mouth 2 (two) times daily for 7 days. 11/10/23 11/17/23 Yes Teddy Sharper, FNP  aspirin  EC 81 MG tablet Take 81 mg by mouth daily.    [provider]  atorvastatin  (LIPITOR) 40 MG tablet Take 1 tablet (40 mg total) by mouth daily. 10/14/23   Burchette, Wolm ORN, MD  calcium -vitamin D  (OSCAL WITH D) 500-200 MG-UNIT TABS tablet Take 1 tablet by mouth daily.    [provider]  cycloSPORINE  (RESTASIS ) 0.05 % ophthalmic emulsion Place 1 drop into both eyes 2 (two) times daily.    [provider]  Flaxseed, Linseed, (FLAX SEED OIL) 1000 MG CAPS Take 1,000 mg by mouth daily.    [provider]  Ginger, Zingiber officinalis, (GINGER ROOT) 550 MG CAPS Take 550 mg by mouth daily.    [provider]  Multiple Vitamins-Minerals (ONE DAILY WOMENS 50+ PO) Take 1 tablet by mouth daily.    [provider]  oxybutynin (DITROPAN-XL) 5 MG 24 hr tablet Take 5 mg by mouth at bedtime.    [provider]  pantoprazole  (PROTONIX ) 40 MG tablet Take 1 tablet (40 mg total) by mouth daily. 07/29/23   Burchette, Wolm ORN, MD  POTASSIUM PO Take 1 tablet by mouth daily.    [provider]  tretinoin (RETIN-A) 0.025 % cream Apply 1 Application topically at bedtime as needed (acne). 10/03/22   [provider]  triamcinolone  cream (KENALOG ) 0.1 % Apply 1 Application topically 2 (two) times daily as needed. 04/16/23   Burchette, Wolm ORN, MD  TURMERIC PO Take 400 mg by mouth 2 (two) times daily.     [provider]    Family History Family History  Problem Relation Age of Onset   Heart disease Mother 18   Cancer Father 76       pancreatic   Ataxia Neg Hx     Social History Social History   Tobacco Use   Smoking status: Former    Current packs/day: 0.00    Average packs/day: 1 pack/day for 20.1 years (20.1 ttl pk-yrs)    Types: Cigarettes    Start date: 34    Quit date: 03/24/1990    Years since quitting: 33.6   Smokeless tobacco: Never  Vaping Use   Vaping status: Never Used  Substance Use Topics   Alcohol  use: Yes    Alcohol /week: 7.0 standard drinks of alcohol     Types: 7 Glasses of wine per week    Comment: 3 times week   Drug use: No     Allergies   Adhesive [tape], Other, and Eliquis [apixaban]   Review of Systems Review of Systems  Genitourinary:  Positive for dysuria.  All other systems reviewed and are negative.    Physical Exam Triage Vital Signs ED Triage Vitals  Encounter Vitals Group     BP  11/10/23 0905 137/83     Girls Systolic BP Percentile --      Girls Diastolic BP Percentile --      Boys Systolic BP Percentile --      Boys Diastolic BP Percentile --      Pulse Rate 11/10/23 0905 67     Resp 11/10/23 0905 19     Temp 11/10/23 0905 98.5 F (36.9 C)     Temp src --      SpO2 11/10/23 0905 98 %  Weight --      Height --      Head Circumference --      Peak Flow --      Pain Score 11/10/23 0904 0     Pain Loc --      Pain Education --      Exclude from Growth Chart --    No data found.  Updated Vital Signs BP 137/83   Pulse 67   Temp 98.5 F (36.9 C)   Resp 19   SpO2 98%     Physical Exam Vitals and nursing note reviewed.  Constitutional:      General: She is not in acute distress.    Appearance: Normal appearance. She is normal weight. She is not ill-appearing.  HENT:     Head: Normocephalic and atraumatic.     Mouth/Throat:     Mouth: Mucous membranes are moist.     Pharynx: Oropharynx is clear.  Eyes:     Extraocular Movements: Extraocular movements intact.     Conjunctiva/sclera: Conjunctivae normal.     Pupils: Pupils are equal, round, and reactive to light.  Cardiovascular:     Rate and Rhythm: Normal rate and regular rhythm.     Pulses: Normal pulses.     Heart sounds: Normal heart sounds.  Pulmonary:     Effort: Pulmonary effort is normal.     Breath sounds: Normal breath sounds. No wheezing, rhonchi or rales.  Abdominal:     Tenderness: There is no right CVA tenderness or left CVA tenderness.  Musculoskeletal:        General: Normal range of motion.  Skin:    General: Skin is warm and dry.  Neurological:     General: No focal deficit present.     Mental Status: She is alert and oriented to person, place, and time.  Psychiatric:        Mood and Affect: Mood normal.        Behavior: Behavior normal.      UC Treatments / Results  Labs (all labs ordered are listed, but only abnormal results are displayed) Labs Reviewed   POCT URINALYSIS DIP (MANUAL ENTRY) - Abnormal; Notable for the following components:      Result Value   Clarity, UA cloudy (*)    Blood, UA moderate (*)    Protein Ur, POC trace (*)    Leukocytes, UA Large (3+) (*)    All other components within normal limits  URINE CULTURE    EKG   Radiology No results found.  Procedures Procedures (including critical care time)  Medications Ordered in UC Medications - No data to display  Initial Impression / Assessment and Plan / UC Course  I have reviewed the triage vital signs and the nursing notes.  Pertinent labs & imaging results that were available during my care of the patient were reviewed by me and considered in my medical decision making (see chart for details).     MDM: 1.  Acute cystitis with hematuria-UA revealed above, urine culture ordered, Rx'd cefdinir  300 mg capsule: Take 1 capsule twice daily x 7 days; 2.  Dysuria-UA revealed above, urine culture ordered, Rx'd cefdinir  300 mg capsule: Take 1 capsule twice daily x 7 days. Advised patient to take medication as directed with food to completion.  Encouraged increase daily water  intake to 64 ounces per day while taking this medication.  Advised we will follow-up with urine culture results once received.  Advised if symptoms worsen and/or  unresolved please follow-up with your PCP, [facility] urology, or here for further evaluation.  Patient discharged home, hemodynamically stable. Final Clinical Impressions(s) / UC Diagnoses   Final diagnoses:  Acute cystitis with hematuria  Dysuria     Discharge Instructions      Advised patient to take medication as directed with food to completion.  Encouraged increase daily water  intake to 64 ounces per day while taking this medication.  Advised we will follow-up with urine culture results once received.  Advised if symptoms worsen and/or unresolved please follow-up with your PCP, [facility] urology, or here for further  evaluation.     ED Prescriptions     Medication Sig Dispense Auth. Provider   cefdinir  (OMNICEF ) 300 MG capsule Take 1 capsule (300 mg total) by mouth 2 (two) times daily for 7 days. 14 capsule Mechille Varghese, FNP      PDMP not reviewed this encounter.   Teddy Sharper, FNP 11/10/23 [phone]

## 2023-11-10 NOTE — Discharge Instructions (Addendum)
 Advised patient to take medication as directed with food to completion.  Encouraged increase daily water intake to 64 ounces per day while taking this medication.  Advised we will follow-up with urine culture results once received.  Advised if symptoms worsen and/or unresolved please follow-up with your PCP, [facility] urology, or here for further evaluation.

## 2023-11-10 NOTE — ED Triage Notes (Signed)
 Pt presents to uc with co dysuria and hematuria since yesterday. Pt reports no otc

## 2023-11-11 ENCOUNTER — Telehealth: Payer: Self-pay

## 2023-11-11 NOTE — Telephone Encounter (Signed)
 Called per call back protocol. Pt reports antibiotics are already helping. Informed patient to call us  if she has any issues.

## 2023-11-13 ENCOUNTER — Ambulatory Visit (HOSPITAL_COMMUNITY): Payer: Self-pay

## 2023-11-13 LAB — URINE CULTURE: Culture: 60000 — AB

## 2023-12-16 ENCOUNTER — Encounter: Payer: Self-pay | Admitting: Family Medicine

## 2023-12-16 ENCOUNTER — Ambulatory Visit: Payer: Self-pay

## 2023-12-16 DIAGNOSIS — M1990 Unspecified osteoarthritis, unspecified site: Secondary | ICD-10-CM

## 2023-12-16 NOTE — Telephone Encounter (Signed)
 FYI Only or Action Required?: FYI only for provider.  Patient was last seen in primary care on 10/14/2023 by Micheal Wolm ORN, MD.  Called Nurse Triage reporting Dysuria.  Symptoms began today.  Interventions attempted: Rest, hydration, or home remedies.  Symptoms are: gradually worsening.  Triage Disposition: No disposition on file.  Patient/caregiver understands and will follow disposition?:   Copied from CRM [id number]. Topic: Clinical - Red Word Triage >> Dec 16, 2023 12:19 PM Pinkey ORN wrote: Red Word that prompted transfer to Nurse Triage: Urinary Burning + Frequent Urge >> Dec 16, 2023 12:20 PM Pinkey ORN wrote: Patient states she's experiencing yet another urinary tract infection. Patient mentioned that she's had several recently.  Reason for Disposition  Urinating more frequently than usual (i.e., frequency) OR new-onset of the feeling of an urgent need to urinate (i.e., urgency)  Answer Assessment - Initial Assessment Questions 3 UTI's in the last month- completed last Abx about 3 weeks ago Frequency and burning- started a few hours ago- has already voided 4x Denies fever, abdominal pain, hematuria. Endorses dark color with strong odor. Pt advised to keep are clean, hydrate. ED/UC/Call back instructions given and understood.  Pt attempted to contact Urology prior to PCP office to schedule. Their website and phone line are not working at this time. Pt will try them later to make them aware of appt tomorrow morning with PCP to assess.   1. SYMPTOM: What's the main symptom you're concerned about? (e.g., frequency, incontinence)     Burning and urgency  2. ONSET: When did the  burning and urgency  start?     Today a couple hours ago  3. PAIN: Is there any pain? If Yes, ask: How bad is it? (Scale: 1-10; mild, moderate, severe)     4/10 4. CAUSE: What do you think is causing the symptoms?     UTI  5. OTHER SYMPTOMS: Do you have any other symptoms? (e.g.,  blood in urine, fever, flank pain, pain with urination)     Dark urine, odor  Protocols used: Urinary Symptoms-A-AH

## 2023-12-17 ENCOUNTER — Encounter: Payer: Self-pay | Admitting: Family Medicine

## 2023-12-17 ENCOUNTER — Ambulatory Visit: Admitting: Family Medicine

## 2023-12-17 VITALS — BP 150/80 | HR 68 | Temp 97.7°F | Wt 155.0 lb

## 2023-12-17 DIAGNOSIS — N3946 Mixed incontinence: Secondary | ICD-10-CM

## 2023-12-17 DIAGNOSIS — R300 Dysuria: Secondary | ICD-10-CM

## 2023-12-17 LAB — POC URINALSYSI DIPSTICK (AUTOMATED)
Bilirubin, UA: NEGATIVE
Glucose, UA: NEGATIVE
Ketones, UA: NEGATIVE
Nitrite, UA: NEGATIVE
Protein, UA: NEGATIVE
Spec Grav, UA: 1.015 (ref 1.010–1.025)
Urobilinogen, UA: 0.2 U/dL
pH, UA: 6 (ref 5.0–8.0)

## 2023-12-17 MED ORDER — CEFDINIR 300 MG PO CAPS: 300.0000 mg | ORAL_CAPSULE | Freq: Two times a day (BID) | ORAL | 0 refills | Status: DC

## 2023-12-17 NOTE — Progress Notes (Signed)
 Established Patient Office Visit  Subjective   [name], female    DOB: 06-Sep-1945  Age: 78 y.o. MRN: [id number]  Chief Complaint  Patient presents with   Dysuria   Urinary Frequency    HPI   Mia Peterson is seen today for possible UTI.  She relates 1 day history of some mild burning with urination and increased frequency.  At baseline, she has mixed incontinence with some stress incontinence and urgency.  She has seen urologist in the past and is currently on oxybutynin.  Still has leakage even with this.  She feels like that may be contributing.  She had documented E. coli UTI back in July and recently in September with both episodes apparently treated at urgent care.  She did respond to cefdinir  with last infection.  Denies any fevers or chills.  No flank pain.  No nausea or vomiting.  Past Medical History:  Diagnosis Date   Arthritis    right knee and foot- OSteo    Asthma    treated in 20's and 30's   Cancer (HCC)    skin- squamous basal   Cataract    Complication of anesthesia    very slow to awaken    GERD (gastroesophageal reflux disease)    prn Prilosec   Head injury, closed    10/26/15- years ago - 30ish   Neuromuscular disorder (HCC)    MS in remission   Shortness of breath dyspnea    with exertion   Stroke (HCC)    hx TIA   Vaso vagal episode    passed out 2 times, close to passing out 3 times   Vertigo    was treated with rehab March and April 2017, does exercises if she begins to have symptoms   Past Surgical History:  Procedure Laterality Date   ABDOMINAL HYSTERECTOMY  1993   menorrhagia.   BREAST ENHANCEMENT SURGERY  1978   CATARACT EXTRACTION, BILATERAL     COLONOSCOPY W/ POLYPECTOMY     COLONOSCOPY WITH PROPOFOL  N/A 02/03/2017   Procedure: COLONOSCOPY WITH PROPOFOL ;  Surgeon: Rollin Dover, MD;  Location: [facility];  Service: Endoscopy;  Laterality: N/A;   KNEE ARTHROSCOPY W/ MENISCAL REPAIR Right    REVERSE SHOULDER ARTHROPLASTY  Right 03/27/2023   Procedure: REVERSE SHOULDER ARTHROPLASTY;  Surgeon: Dozier Soulier, MD;  Location: [facility];  Service: Orthopedics;  Laterality: Right;   TOTAL KNEE ARTHROPLASTY Right 11/03/2015   Procedure: RIGHT TOTAL KNEE ARTHROPLASTY;  Surgeon: Norleen Gavel, MD;  Location: [facility];  Service: Orthopedics;  Laterality: Right;   TUBAL LIGATION  1972   VARICOSE VEIN SURGERY  1978    reports that she quit smoking about 33 years ago. Her smoking use included cigarettes. She started smoking about 53 years ago. She has a 20.1 pack-year smoking history. She has never used smokeless tobacco. She reports current alcohol  use of about 7.0 standard drinks of alcohol  per week. She reports that she does not use drugs. family history includes Cancer (age of onset: 84) in her father; Heart disease (age of onset: 62) in her mother. Allergies  Allergen Reactions   Adhesive [Tape] Hives    Use paper tape only   Other Itching and Rash    Ronal Murray cooling pads, severe itching   Eliquis [Apixaban] Hives and Itching    Review of Systems  Constitutional:  Negative for chills and fever.  Gastrointestinal:  Negative for nausea and vomiting.  Genitourinary:  Positive for dysuria and  frequency. Negative for flank pain.      Objective:     BP (!) 150/80   Pulse 68   Temp 97.7 F (36.5 C) (Oral)   Wt 155 lb (70.3 kg)   SpO2 99%   BMI 23.57 kg/m  BP Readings from Last 3 Encounters:  12/17/23 (!) 150/80  11/10/23 137/83  10/14/23 (!) 140/70   Wt Readings from Last 3 Encounters:  12/17/23 155 lb (70.3 kg)  10/14/23 159 lb 12.8 oz (72.5 kg)  06/24/23 161 lb 6.4 oz (73.2 kg)      Physical Exam Vitals reviewed.  Constitutional:      General: She is not in acute distress.    Appearance: She is not ill-appearing or toxic-appearing.  Cardiovascular:     Rate and Rhythm: Normal rate and regular rhythm.  Pulmonary:     Effort: Pulmonary effort is normal.     Breath sounds: Normal breath sounds. No  wheezing or rales.  Neurological:     Mental Status: She is alert.      Results for orders placed or performed in visit on 12/17/23  POCT Urinalysis Dipstick (Automated)  Result Value Ref Range   Color, UA yellow    Clarity, UA cloudy    Glucose, UA Negative Negative   Bilirubin, UA negative    Ketones, UA negative    Spec Grav, UA 1.015 1.010 - 1.025   Blood, UA 2+    pH, UA 6.0 5.0 - 8.0   Protein, UA Negative Negative   Urobilinogen, UA 0.2 0.2 or 1.0 E.U./dL   Nitrite, UA negative    Leukocytes, UA Large (3+) (A) Negative      The ASCVD Risk score (Arnett DK, et al., 2019) failed to calculate for the following reasons:   Risk score cannot be calculated because patient has a medical history suggesting prior/existing ASCVD    Assessment & Plan:   Problem List Items Addressed This Visit   None Visit Diagnoses       Dysuria    -  Primary   Relevant Orders   POCT Urinalysis Dipstick (Automated) (Completed)   Urine Culture     Patient seen with 1 day history of urine frequency and mild burning with urination.  Urine dipstick suggests recurrent UTI.  This would be third that she has had in 3 months.  Urine culture sent.  Stay well-hydrated.  Start cefdinir  300 mg twice daily pending culture result.  Patient plans to set up follow-up with her urologist soon to discuss her ongoing mixed incontinence episodes.  May need to look at low-dose antibiotic prophylaxis if she continues to have frequent UTIs  No follow-ups on file.    Wolm Scarlet, MD

## 2023-12-19 ENCOUNTER — Ambulatory Visit: Payer: Self-pay | Admitting: Family Medicine

## 2023-12-19 LAB — URINE CULTURE
MICRO NUMBER:: 17133186
SPECIMEN QUALITY:: ADEQUATE

## 2023-12-23 ENCOUNTER — Telehealth: Payer: Self-pay | Admitting: Radiation Oncology

## 2023-12-23 NOTE — Telephone Encounter (Signed)
 10/28 @ 4:00 pm Left voicemail with patient and patient's spouse to call our office to be sch for consult.

## 2023-12-25 ENCOUNTER — Telehealth: Payer: Self-pay | Admitting: Radiation Oncology

## 2023-12-25 NOTE — Telephone Encounter (Signed)
 10/30 @ 8:53 am Left voicemail for patient to call our office to be sch for consult.

## 2023-12-29 MED ORDER — PANTOPRAZOLE SODIUM 40 MG PO TBEC: 40.0000 mg | DELAYED_RELEASE_TABLET | Freq: Every day | ORAL | 3 refills | Status: AC

## 2023-12-29 NOTE — Addendum Note (Signed)
 Addended by: METTA KRISTEN CROME on: 12/29/2023 01:01 PM   Modules accepted: Orders

## 2024-01-02 ENCOUNTER — Encounter: Payer: Self-pay | Admitting: Urology

## 2024-01-02 ENCOUNTER — Ambulatory Visit: Admitting: Urology

## 2024-01-02 VITALS — BP 144/83 | HR 84 | Ht 67.0 in | Wt 155.0 lb

## 2024-01-02 DIAGNOSIS — N3941 Urge incontinence: Secondary | ICD-10-CM

## 2024-01-02 DIAGNOSIS — N390 Urinary tract infection, site not specified: Secondary | ICD-10-CM | POA: Diagnosis not present

## 2024-01-02 DIAGNOSIS — Z87440 Personal history of urinary (tract) infections: Secondary | ICD-10-CM | POA: Diagnosis not present

## 2024-01-02 LAB — URINALYSIS, ROUTINE W REFLEX MICROSCOPIC
Bilirubin, UA: NEGATIVE
Glucose, UA: NEGATIVE
Nitrite, UA: NEGATIVE
Protein,UA: NEGATIVE
Specific Gravity, UA: 1.01 (ref 1.005–1.030)
Urobilinogen, Ur: 0.2 mg/dL (ref 0.2–1.0)
pH, UA: 6 (ref 5.0–7.5)

## 2024-01-02 LAB — MICROSCOPIC EXAMINATION: Epithelial Cells (non renal): >10 /HPF — AB (ref 0–10)

## 2024-01-02 NOTE — Progress Notes (Signed)
 Assessment: 1. Frequent UTI   2. Urge incontinence     Plan: I personally reviewed the patient's chart including provider notes, lab results. I reviewed the patient records from [facility]. I discussed methods to reduce the risk of UTIs including increase fluid intake, timed double voiding, daily cranberry supplement, daily probiotic, vaginal hormone cream, and prophylactic antibiotics.  I recommended that she continue with all of these modalities at this time. I provided her with samples of Gemtesa 75 mg for her OAB/urge incontinence. She has an appointment with Dr.MacDiarmid on 11/26 and would like to follow-up with him. Return prn  Chief Complaint:  Chief Complaint  Patient presents with   Recurrent UTI    History of Present Illness:  Mia Peterson is a 78 y.o. female who is seen for evaluation of frequent UTI's and mixed incontinence. She has a history of OAB with urge incontinence and has been followed at [facility].  She was previously managed with oxybutynin ER 5 mg daily.  She was last seen for her incontinence in February 2025. She has recently had recurrent UTIs x 6 months.  Typical symptoms include increased frequency, urgency, dysuria, and gross hematuria.  She has also noted increased urinary incontinence.  Urine culture results: 7/25 >100K E. Coli 9/25  60K E. Coli 10/25 >100K E. coli 10/25 2K Enterococcus  She is currently on Macrobid  x 7 days for treatment of the most recent urine culture.  She reports that her symptoms are improving with antibiotics.  She discontinued the oxybutynin approximately 1 week ago.  She was started on a daily probiotic as well as vaginal hormone cream.  She was also given a prescription for daily trimethoprim.  She presents today for a second opinion and to consider establishing care with our office.  Past Medical History:  Past Medical History:  Diagnosis Date   Arthritis    right knee and foot- OSteo    Asthma     treated in 20's and 30's   Cancer (HCC)    skin- squamous basal   Cataract    Complication of anesthesia    very slow to awaken    GERD (gastroesophageal reflux disease)    prn Prilosec   Head injury, closed    10/26/15- years ago - 30ish   Neuromuscular disorder (HCC)    MS in remission   Shortness of breath dyspnea    with exertion   Stroke (HCC)    hx TIA   Vaso vagal episode    passed out 2 times, close to passing out 3 times   Vertigo    was treated with rehab March and April 2017, does exercises if she begins to have symptoms    Past Surgical History:  Past Surgical History:  Procedure Laterality Date   ABDOMINAL HYSTERECTOMY  1993   menorrhagia.   BREAST ENHANCEMENT SURGERY  1978   CATARACT EXTRACTION, BILATERAL     COLONOSCOPY W/ POLYPECTOMY     COLONOSCOPY WITH PROPOFOL  N/A 02/03/2017   Procedure: COLONOSCOPY WITH PROPOFOL ;  Surgeon: Rollin Dover, MD;  Location: [facility];  Service: Endoscopy;  Laterality: N/A;   KNEE ARTHROSCOPY W/ MENISCAL REPAIR Right    REVERSE SHOULDER ARTHROPLASTY Right 03/27/2023   Procedure: REVERSE SHOULDER ARTHROPLASTY;  Surgeon: Dozier Soulier, MD;  Location: [facility];  Service: Orthopedics;  Laterality: Right;   TOTAL KNEE ARTHROPLASTY Right 11/03/2015   Procedure: RIGHT TOTAL KNEE ARTHROPLASTY;  Surgeon: Norleen Gavel, MD;  Location: [facility];  Service: Orthopedics;  Laterality: Right;   TUBAL LIGATION  1972   VARICOSE VEIN SURGERY  1978    Allergies:  Allergies  Allergen Reactions   Adhesive [Tape] Hives    Use paper tape only   Other Itching and Rash    Ronal Murray cooling pads, severe itching   Eliquis [Apixaban] Hives and Itching    Family History:  Family History  Problem Relation Age of Onset   Heart disease Mother 24   Cancer Father 49       pancreatic   Ataxia Neg Hx     Social History:  Social History   Tobacco Use   Smoking status: Former    Current packs/day: 0.00    Average packs/day: 1 pack/day for 20.1  years (20.1 ttl pk-yrs)    Types: Cigarettes    Start date: 86    Quit date: 03/24/1990    Years since quitting: 33.8   Smokeless tobacco: Never  Vaping Use   Vaping status: Never Used  Substance Use Topics   Alcohol  use: Yes    Alcohol /week: 7.0 standard drinks of alcohol     Types: 7 Glasses of wine per week    Comment: 3 times week   Drug use: No    Review of symptoms:  Constitutional:  Negative for unexplained weight loss, night sweats, fever, chills ENT:  Negative for nose bleeds, sinus pain, painful swallowing CV:  Negative for chest pain, shortness of breath, exercise intolerance, palpitations, loss of consciousness Resp:  Negative for cough, wheezing, shortness of breath GI:  Negative for nausea, vomiting, diarrhea, bloody stools GU:  Positives noted in HPI Neuro:  Negative for seizures, poor balance, limb weakness, slurred speech Psych:  Negative for lack of energy, depression, anxiety Endocrine:  Negative for polydipsia, polyuria, symptoms of hypoglycemia (dizziness, hunger, sweating) Hematologic:  Negative for anemia, purpura, petechia, prolonged or excessive bleeding, use of anticoagulants  Allergic:  Negative for difficulty breathing or choking as a result of exposure to anything; no shellfish allergy; no allergic response (rash/itch) to materials, foods  Physical exam: BP (!) 144/83   Pulse 84   Ht 5' 7 (1.702 m)   Wt 155 lb (70.3 kg)   BMI 24.28 kg/m  GENERAL APPEARANCE:  Well appearing, well developed, well nourished, NAD HEENT: Atraumatic, Normocephalic, oropharynx clear. NECK: Supple without lymphadenopathy or thyromegaly. LUNGS: Clear to auscultation bilaterally. HEART: Regular Rate and Rhythm without murmurs, gallops, or rubs. ABDOMEN: Soft, non-tender, No Masses. EXTREMITIES: Moves all extremities well.  Without clubbing, cyanosis, or edema. NEUROLOGIC:  Alert and oriented x 3, normal gait, CN II-XII grossly intact.  MENTAL STATUS:   Appropriate. BACK:  Non-tender to palpation.  No CVAT SKIN:  Warm, dry and intact.    Results: U/A: 0-5 WBCs, 3-10 RBCs, >10 epithelial cells, moderate bacteria  PVR =  40 ml

## 2024-01-05 NOTE — Progress Notes (Addendum)
 Histology and Location of Primary : Osteoarthritis, unspecified osteoarthritis type, unspecified site   Mia Peterson presented as referral from Dr. Wolm MICAEL [facility]) fro consideration of radiation osteoarthritis.  Location(s) of Symptomatic : Right leg  Past/Anticipated by primary care physician:  Dr. Wolm Burchette  Pain on a scale of 0-10 is:  0/10   If Spine, symptoms, if any, include: Bowel/Bladder retention or incontinence (please describe):  Yes, urinary frequency. Numbness or weakness in extremities (please describe): right leg weaker than left Current Decadron  regimen, if applicable:   Ambulatory status? Walker? Wheelchair?:  Independent  SAFETY ISSUES: Prior radiation?  No Pacemaker/ICD? No Possible current pregnancy? Hysterectomy Is the patient on methotrexate? No  Current Complaints / other details: None   30 minutes spent total, including time for meaningful use questions, reviewing medication, as well as spent in face-to-face time in nurse evaluation with the patient.

## 2024-01-11 NOTE — Progress Notes (Signed)
 Radiation Oncology         [phone] ________________________________  Initial outpatient Consultation  Name: Mia Peterson MRN: [id number]  Date of Service: 01/12/2024 DOB: 03-15-1945  RR:Almryzuuz, Wolm ORN, MD  Micheal Wolm ORN, MD   REFERRING PHYSICIAN: Micheal Wolm ORN, MD  DIAGNOSIS: There were no encounter diagnoses.  No diagnosis found.  HISTORY OF PRESENT ILLNESS: Mia Peterson is a 78 y.o. female seen at the request of Dr. Micheal. She has been followed by [facility] for a long-standing history of foot pain. She presents in person today for evaluation of chronic, progressively debilitating right-sided joint pain. She reports a many-year history of osteoarthritis predominantly affecting the right side of her body, followed by [employer] and other specialists for several years. She has undergone a right total knee replacement (2017) and a right reverse shoulder arthroplasty with subsequent revision (2025). Despite these interventions, her most limiting current issues are right hip pain and right foot pain, which substantially reduce her mobility and prevent her from traveling. She states she has "tried everything" offered by orthopedics, including custom shoe inserts, medial wedge adjustments, injections, and activity modification, without meaningful or durable relief. Right Hip History MRI of the right hip from November 2022 demonstrated: Moderate degenerative arthropathy of both hips Mild right hip joint effusion Mild right iliopsoas bursitis Associated lumbar spondylosis and degenerative disc disease These findings correlate with her persistent deep hip pain and stiffness. 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 Medical history relevant to treatment Remote history of multiple pulmonary emboli following shoulder surgery, now off anticoagulation after 6 months of Coumadin  Prior TIA, osteoarthritis, osteoporosis, GERD NYHA baseline functional status limited only by joint pain No current cardiopulmonary symptoms Former smoker; no active tobacco use She presents today to discuss low-dose external beam radiotherapy for refractory osteoarthritis of the right hip and right foot.  PREVIOUS RADIATION THERAPY: No  PAST MEDICAL HISTORY:  Past Medical History:  Diagnosis Date   Arthritis    right knee and foot- OSteo    Asthma    treated in 20's and 30's   Cancer (HCC)    skin- squamous basal   Cataract    Complication of anesthesia    very slow to awaken    GERD (gastroesophageal reflux disease)    prn Prilosec   Head injury, closed    10/26/15- years ago - 30ish   Neuromuscular disorder (HCC)    MS in remission   Shortness of breath dyspnea    with exertion   Stroke (HCC)    hx TIA   Vaso vagal episode    passed out 2 times, close to passing out 3 times   Vertigo    was treated with rehab March and April 2017, does exercises if she begins to have symptoms      PAST SURGICAL HISTORY: Past Surgical History:  Procedure Laterality Date   ABDOMINAL HYSTERECTOMY  1993   menorrhagia.   BREAST ENHANCEMENT SURGERY  1978   CATARACT EXTRACTION, BILATERAL     COLONOSCOPY W/ POLYPECTOMY     COLONOSCOPY WITH PROPOFOL  N/A 02/03/2017   Procedure: COLONOSCOPY WITH PROPOFOL ;  Surgeon: Rollin,  Belvie, MD;  Location: [facility];  Service: Endoscopy;  Laterality: N/A;   KNEE ARTHROSCOPY W/ MENISCAL REPAIR Right    REVERSE SHOULDER ARTHROPLASTY Right 03/27/2023   Procedure: REVERSE SHOULDER ARTHROPLASTY;   Surgeon: Dozier Soulier, MD;  Location: [facility];  Service: Orthopedics;  Laterality: Right;   TOTAL KNEE ARTHROPLASTY Right 11/03/2015   Procedure: RIGHT TOTAL KNEE ARTHROPLASTY;  Surgeon: Norleen Gavel, MD;  Location: [facility];  Service: Orthopedics;  Laterality: Right;   TUBAL LIGATION  1972   VARICOSE VEIN SURGERY  1978    FAMILY HISTORY:  Family History  Problem Relation Age of Onset   Heart disease Mother 54   Cancer Father 22       pancreatic   Ataxia Neg Hx     SOCIAL HISTORY:  Social History   Socioeconomic History   Marital status: Married    Spouse name: Not on file   Number of children: Not on file   Years of education: Not on file   Highest education level: Doctorate  Occupational History   Not on file  Tobacco Use   Smoking status: Former    Current packs/day: 0.00    Average packs/day: 1 pack/day for 20.1 years (20.1 ttl pk-yrs)    Types: Cigarettes    Start date: 23    Quit date: 03/24/1990    Years since quitting: 33.8   Smokeless tobacco: Never  Vaping Use   Vaping status: Never Used  Substance and Sexual Activity   Alcohol  use: Yes    Alcohol /week: 7.0 standard drinks of alcohol     Types: 7 Glasses of wine per week    Comment: 3 times week   Drug use: No   Sexual activity: Yes  Other Topics Concern   Not on file  Social History Narrative   Not on file   Social Drivers of Health   Financial Resource Strain: Low Risk  (12/16/2023)   Overall Financial Resource Strain (CARDIA)    Difficulty of Paying Living Expenses: Not hard at all  Food Insecurity: No Food Insecurity (12/16/2023)   Hunger Vital Sign    Worried About Running Out of Food in the Last Year: Never true    Ran Out of Food in the Last Year: Never true  Transportation Needs: No Transportation Needs (12/16/2023)   PRAPARE - Administrator, Civil Service (Medical): No    Lack of Transportation (Non-Medical): No  Physical Activity: Sufficiently Active (12/16/2023)    Exercise Vital Sign    Days of Exercise per Week: 6 days    Minutes of Exercise per Session: 50 min  Stress: No Stress Concern Present (12/16/2023)   [employer] of Occupational Health - Occupational Stress Questionnaire    Feeling of Stress: Not at all  Social Connections: Socially Integrated (12/16/2023)   Social Connection and Isolation Panel    Frequency of Communication with Friends and Family: Three times a week    Frequency of Social Gatherings with Friends and Family: Twice a week    Attends Religious Services: More than 4 times per year    Active Member of [employer] or Organizations: Yes    Attends Engineer, Structural: More than 4 times per year    Marital Status: Married  Catering Manager Violence: Not At Risk (04/06/2023)   Received from [facility]   HITS    Over the last 12 months how often did your partner physically hurt you?: Never    Over the last 12 months  how often did your partner insult you or talk down to you?: Never    Over the last 12 months how often did your partner threaten you with physical harm?: Never    Over the last 12 months how often did your partner scream or curse at you?: Never    ALLERGIES: Adhesive [tape], Other, and Eliquis [apixaban]  MEDICATIONS:  Current Outpatient Medications  Medication Sig Dispense Refill   aspirin  EC 81 MG tablet Take 81 mg by mouth daily.     atorvastatin  (LIPITOR) 40 MG tablet Take 1 tablet (40 mg total) by mouth daily. 90 tablet 3   calcium -vitamin D  (OSCAL WITH D) 500-200 MG-UNIT TABS tablet Take 1 tablet by mouth daily.     cefdinir  (OMNICEF ) 300 MG capsule Take 1 capsule (300 mg total) by mouth 2 (two) times daily. (Patient not taking: Reported on 01/02/2024) 14 capsule 0   cycloSPORINE  (RESTASIS ) 0.05 % ophthalmic emulsion Place 1 drop into both eyes 2 (two) times daily.     Flaxseed, Linseed, (FLAX SEED OIL) 1000 MG CAPS Take 1,000 mg by mouth daily.     Ginger, Zingiber officinalis, (GINGER ROOT) 550  MG CAPS Take 550 mg by mouth daily.     Multiple Vitamins-Minerals (ONE DAILY WOMENS 50+ PO) Take 1 tablet by mouth daily.     nitrofurantoin , macrocrystal-monohydrate, (MACROBID ) 100 MG capsule Take 100 mg by mouth 2 (two) times daily.     oxybutynin (DITROPAN-XL) 5 MG 24 hr tablet Take 5 mg by mouth at bedtime. (Patient not taking: Reported on 01/02/2024)     pantoprazole  (PROTONIX ) 40 MG tablet Take 1 tablet (40 mg total) by mouth daily. 90 tablet 3   POTASSIUM PO Take 1 tablet by mouth daily.     PREMARIN vaginal cream Place vaginally.     tretinoin (RETIN-A) 0.025 % cream Apply 1 Application topically at bedtime as needed (acne). (Patient not taking: Reported on 01/02/2024)     triamcinolone  cream (KENALOG ) 0.1 % Apply 1 Application topically 2 (two) times daily as needed. 80 g 2   trimethoprim (TRIMPEX) 100 MG tablet Take 100 mg by mouth at bedtime.     TURMERIC PO Take 400 mg by mouth 2 (two) times daily.      No current facility-administered medications for this encounter.    REVIEW OF SYSTEMS:  On review of systems, the patient reports that she is doing well overall. She denies any chest pain, shortness of breath, cough, fevers, chills, night sweats, unintended weight changes. She denies any bowel or bladder disturbances, and denies abdominal pain, nausea or vomiting. She denies any new musculoskeletal or joint aches or pains other than noted in HPI A complete review of systems is obtained and is otherwise negative.    PHYSICAL EXAM:  Wt Readings from Last 3 Encounters:  01/02/24 155 lb (70.3 kg)  12/17/23 155 lb (70.3 kg)  10/14/23 159 lb 12.8 oz (72.5 kg)   Temp Readings from Last 3 Encounters:  12/17/23 97.7 F (36.5 C) (Oral)  11/10/23 98.5 F (36.9 C)  10/14/23 98.1 F (36.7 C) (Oral)   BP Readings from Last 3 Encounters:  01/02/24 (!) 144/83  12/17/23 (!) 150/80  11/10/23 137/83   Pulse Readings from Last 3 Encounters:  01/02/24 84  12/17/23 68  11/10/23 67     /10  In general this is a well appearing woman in no acute distress. She's alert and oriented x4 and appropriate throughout the examination. Cardiopulmonary assessment is negative for  acute distress and she exhibits normal effort.     KPS = 70  100 - Normal; no complaints; no evidence of disease. 90   - Able to carry on normal activity; minor signs or symptoms of disease. 80   - Normal activity with effort; some signs or symptoms of disease. 95   - Cares for self; unable to carry on normal activity or to do active work. 60   - Requires occasional assistance, but is able to care for most of his personal needs. 50   - Requires considerable assistance and frequent medical care. 40   - Disabled; requires special care and assistance. 30   - Severely disabled; hospital admission is indicated although death not imminent. 20   - Very sick; hospital admission necessary; active supportive treatment necessary. 10   - Moribund; fatal processes progressing rapidly. 0     - Dead  Karnofsky DA, Abelmann WH, Craver LS and Burchenal [facility] [phone]) The use of the nitrogen mustards in the palliative treatment of carcinoma: with particular reference to bronchogenic carcinoma Cancer 1 634-56  LABORATORY DATA:  Lab Results  Component Value Date   WBC 4.4 10/22/2023   HGB 12.9 10/22/2023   HCT 38.5 10/22/2023   MCV 89.0 10/22/2023   PLT 276.0 10/22/2023   Lab Results  Component Value Date   NA 135 10/22/2023   K 4.5 10/22/2023   CL 98 10/22/2023   CO2 29 10/22/2023   Lab Results  Component Value Date   ALT 21 10/22/2023   AST 30 10/22/2023   ALKPHOS 64 10/22/2023   BILITOT 0.6 10/22/2023     RADIOGRAPHY: No results found.    IMPRESSION/PLAN: 1. 78 y.o. 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 There are no contraindications to low dose radiation identified at this time  After a detailed discussion of the patient's history, prior treatment response, and current goals, we reviewed the proposed protocol for LDRT targeting the right hip. Our institutional approach follows a regimen of 3 Gy total, delivered in 6 fractions of 0.5 Gy on a Monday-Wednesday-Friday schedule over two weeks. This dosing is consistent with published guidelines and peer-reviewed data for non-malignant musculoskeletal conditions.  We reviewed:   Goals of care: symptom relief and improved mobility  Expected timeline for therapeutic benefit: typically several weeks to months  Potential risks, including rare but theoretical concerns regarding late tissue effects or secondary malignancy (risk estimated to be extremely low at this dose and age)  My goal with low dose radiation therapy is to help reduce chronic joint pain and improve her mobility, particularly for activities like walking or standing for longer periods. This treatment does not reverse joint damage, but it can calm the inflammation in and around the joint that contributes to pain. Based on published data, about 60-80% of patients with osteoarthritis experience meaningful pain relief within a few  weeks to months after completing a short course of low-dose radiation. While not  everyone responds, the majority of patients do report improvement, and the treatment is generally well tolerated.  Plan Proceed with low-dose external beam radiotherapy to both the right hip and right foot. Recommended dose: 3 Gy  6 fractions (total 18 Gy) to each site. This regimen is well-tolerated and has strong data supporting improvement in pain and function for refractory arthritis. Submit prior authorization to [facility]. Schedule CT simulation on December 1 as planned. Radiation to be delivered sequentially or concurrently depending on scheduling and patient comfort. Continue current orthopedic management as needed; no contraindications to RT. Follow-up after completion of radiotherapy to assess early symptom response.  We personally spent 60 minutes in this encounter including chart review, reviewing radiological studies, meeting face-to-face with the patient, entering orders and completing documentation.      Donnice Barge, MD  [facility]  Radiation Oncology Direct Dial: [phone]  Fax: [phone] [email or web address]  Skype  [email or web address]   This document serves as a record of services personally performed by Donnice Barge, MD and Sabra Rusk, PA-C. It was created on their behalf by Izetta Neither, a trained medical scribe. The creation of this record is based on the scribe's personal observations and the provider's statements to them. This document has been checked and approved by the attending provider.

## 2024-01-12 ENCOUNTER — Encounter: Payer: Self-pay | Admitting: Radiation Oncology

## 2024-01-12 ENCOUNTER — Ambulatory Visit
Admission: RE | Admit: 2024-01-12 | Discharge: 2024-01-12 | Disposition: A | Discharge status: Home / Self Care | Source: Ambulatory Visit | Attending: Radiation Oncology | Admitting: Radiation Oncology

## 2024-01-12 VITALS — BP 152/89 | HR 74 | Temp 97.7°F | Resp 18 | Ht 68.0 in | Wt 154.4 lb

## 2024-01-12 DIAGNOSIS — Z7982 Long term (current) use of aspirin: Secondary | ICD-10-CM | POA: Diagnosis not present

## 2024-01-12 DIAGNOSIS — Z8673 Personal history of transient ischemic attack (TIA), and cerebral infarction without residual deficits: Secondary | ICD-10-CM | POA: Diagnosis not present

## 2024-01-12 DIAGNOSIS — M810 Age-related osteoporosis without current pathological fracture: Secondary | ICD-10-CM | POA: Insufficient documentation

## 2024-01-12 DIAGNOSIS — M1611 Unilateral primary osteoarthritis, right hip: Secondary | ICD-10-CM | POA: Insufficient documentation

## 2024-01-12 DIAGNOSIS — M1990 Unspecified osteoarthritis, unspecified site: Secondary | ICD-10-CM

## 2024-01-12 DIAGNOSIS — M25451 Effusion, right hip: Secondary | ICD-10-CM | POA: Insufficient documentation

## 2024-01-12 DIAGNOSIS — K219 Gastro-esophageal reflux disease without esophagitis: Secondary | ICD-10-CM | POA: Insufficient documentation

## 2024-01-12 DIAGNOSIS — M19071 Primary osteoarthritis, right ankle and foot: Secondary | ICD-10-CM | POA: Diagnosis not present

## 2024-01-12 DIAGNOSIS — Z87891 Personal history of nicotine dependence: Secondary | ICD-10-CM | POA: Diagnosis not present

## 2024-01-12 DIAGNOSIS — Z86711 Personal history of pulmonary embolism: Secondary | ICD-10-CM | POA: Insufficient documentation

## 2024-01-12 DIAGNOSIS — Z79899 Other long term (current) drug therapy: Secondary | ICD-10-CM | POA: Diagnosis not present

## 2024-01-12 DIAGNOSIS — M47816 Spondylosis without myelopathy or radiculopathy, lumbar region: Secondary | ICD-10-CM | POA: Diagnosis not present

## 2024-01-12 DIAGNOSIS — Z85828 Personal history of other malignant neoplasm of skin: Secondary | ICD-10-CM | POA: Diagnosis not present

## 2024-01-26 ENCOUNTER — Ambulatory Visit: Admission: RE | Admit: 2024-01-26 | Source: Ambulatory Visit | Admitting: Radiation Oncology

## 2024-01-30 ENCOUNTER — Encounter: Payer: Self-pay | Admitting: Family Medicine

## 2024-01-30 ENCOUNTER — Ambulatory Visit: Admitting: Family Medicine

## 2024-01-30 VITALS — BP 110/80 | HR 64 | Temp 98.1°F | Wt 154.7 lb

## 2024-01-30 DIAGNOSIS — K5900 Constipation, unspecified: Secondary | ICD-10-CM

## 2024-01-30 DIAGNOSIS — R1084 Generalized abdominal pain: Secondary | ICD-10-CM

## 2024-01-30 NOTE — Patient Instructions (Signed)
 For recurrent constipation, consider over the counter Miralax  17 grams once or twice daily.

## 2024-01-30 NOTE — Progress Notes (Unsigned)
 Established Patient Office Visit  Subjective   [name], female    DOB: 1946-02-21  Age: 78 y.o. MRN: [id number]  Chief Complaint  Patient presents with   Hospitalization Follow-up    HPI   [name] is seen today for her hospital follow-up regarding recent ER visit on 01-27-2024 for lower abdominal pain.  Began around 6 AM day of evaluation in the ER.  Pain was diffuse lower abdomen without radiation.  Worse with standing.  She had had some nausea but denied any vomiting prior to ER but did have 6 episodes after she arrived.  She states she had 1 large bowel movement that was loose that morning.  White count was 12.5 thousand.  Initial sodium 129 and improved to 135 after IV fluids.  Lipase normal.  CT scan showed some fecal retention otherwise no acute findings.  Appendix was not seen.  She was instructed to take some stool softener but did not have any bowel movements on Wednesday or Thursday after she was seen.  This morning around 4:30 she had some discomfort and had straining with some hard stool.  This was followed by frequent nondiarrhea bowel movements this morning until 9 AM.  No bloody stools.  No fevers or chills.  Recent CT scan results were reviewed.  There was no evidence for acute diverticulitis or other acute infectious findings.  Recent labs from ER reviewed.  Her kidney function and liver enzymes  were normal.  Past Medical History:  Diagnosis Date   Arthritis    right knee and foot- [facility]    Asthma    treated in 20's and 30's   Cancer (HCC)    skin- squamous basal   Cataract    Complication of anesthesia    very slow to awaken    GERD (gastroesophageal reflux disease)    prn Prilosec   Head injury, closed    10/26/15- years ago - 30ish   Neuromuscular disorder (HCC)    MS in remission   Shortness of breath dyspnea    with exertion   Stroke (HCC)    hx TIA   Vaso vagal episode    passed out 2 times, close to passing out 3 times   Vertigo     was treated with rehab March and April 2017, does exercises if she begins to have symptoms   Past Surgical History:  Procedure Laterality Date   ABDOMINAL HYSTERECTOMY  1993   menorrhagia.   BREAST ENHANCEMENT SURGERY  1978   CATARACT EXTRACTION, BILATERAL     COLONOSCOPY W/ POLYPECTOMY     COLONOSCOPY WITH PROPOFOL  N/A 02/03/2017   Procedure: COLONOSCOPY WITH PROPOFOL ;  Surgeon: Rollin Dover, MD;  Location: [facility];  Service: Endoscopy;  Laterality: N/A;   KNEE ARTHROSCOPY W/ MENISCAL REPAIR Right    REVERSE SHOULDER ARTHROPLASTY Right 03/27/2023   Procedure: REVERSE SHOULDER ARTHROPLASTY;  Surgeon: Dozier Soulier, MD;  Location: [facility];  Service: Orthopedics;  Laterality: Right;   TOTAL KNEE ARTHROPLASTY Right 11/03/2015   Procedure: RIGHT TOTAL KNEE ARTHROPLASTY;  Surgeon: Norleen Gavel, MD;  Location: [facility];  Service: Orthopedics;  Laterality: Right;   TUBAL LIGATION  1972   VARICOSE VEIN SURGERY  1978    reports that she quit smoking about 33 years ago. Her smoking use included cigarettes. She started smoking about 53 years ago. She has a 20.1 pack-year smoking history. She has never used smokeless tobacco. She reports current alcohol  use of about 7.0 standard drinks  of alcohol  per week. She reports that she does not use drugs. family history includes Cancer (age of onset: 85) in her father; Heart disease (age of onset: 31) in her mother. Allergies  Allergen Reactions   Other Itching and Rash    Ronal Murray cooling pads, severe itching   Tape Hives, Dermatitis and Itching    Use paper tape only   Eliquis [Apixaban] Hives and Itching    Review of Systems  Constitutional:  Negative for chills and fever.  Respiratory:  Negative for cough.   Gastrointestinal:  Negative for blood in stool, nausea and vomiting.      Objective:     BP 110/80   Pulse 64   Temp 98.1 F (36.7 C) (Oral)   Wt 154 lb 11.2 oz (70.2 kg)   SpO2 98%   BMI 23.52 kg/m  BP Readings from Last 3  Encounters:  01/30/24 110/80  01/12/24 (!) 152/89  01/02/24 (!) 144/83   Wt Readings from Last 3 Encounters:  01/30/24 154 lb 11.2 oz (70.2 kg)  01/12/24 154 lb 6.4 oz (70 kg)  01/02/24 155 lb (70.3 kg)      Physical Exam Vitals reviewed.  Constitutional:      General: She is not in acute distress.    Appearance: She is not ill-appearing.  Cardiovascular:     Rate and Rhythm: Normal rate and regular rhythm.  Pulmonary:     Effort: Pulmonary effort is normal.     Breath sounds: Normal breath sounds. No wheezing or rales.  Abdominal:     Comments: Abdomen is nondistended.  Normal bowel sounds.  Soft with some mild tenderness right lower quadrant.  No guarding or rebound.  Neurological:     Mental Status: She is alert.      No results found for any visits on 01/30/24.    The ASCVD Risk score (Arnett DK, et al., 2019) failed to calculate for the following reasons:   Risk score cannot be calculated because patient has a medical history suggesting prior/existing ASCVD    Assessment & Plan:   Patient recently seen for lower abdominal pain and constipation.  Recent ER notes reviewed along with lab work and CT imaging.  No acute finding on CT.  She did have some fecal retention.  She then had succession of nondiarrhea bowel movements earlier today after hard stool.  Suspect she had some fecal impaction and after that cleared loose stool behind that.  She feels overall better at this time.  Still has suboptimal appetite.  No fever.  No further vomiting.  -Discussed management of constipation with handout given.  For future recurrence try MiraLAX  17 g once or twice daily as needed - She will focus on bland diet over the weekend. - Follow-up immediately or go to ER for any fever or worsening abdominal pain   Wolm Scarlet, MD

## 2024-02-02 ENCOUNTER — Ambulatory Visit

## 2024-02-03 ENCOUNTER — Ambulatory Visit

## 2024-02-04 ENCOUNTER — Ambulatory Visit

## 2024-02-05 ENCOUNTER — Ambulatory Visit

## 2024-02-05 ENCOUNTER — Ambulatory Visit
Admission: RE | Admit: 2024-02-05 | Discharge: 2024-02-05 | Attending: Radiation Oncology | Admitting: Radiation Oncology

## 2024-02-05 DIAGNOSIS — M1611 Unilateral primary osteoarthritis, right hip: Secondary | ICD-10-CM | POA: Diagnosis present

## 2024-02-05 DIAGNOSIS — M19071 Primary osteoarthritis, right ankle and foot: Secondary | ICD-10-CM | POA: Insufficient documentation

## 2024-02-05 DIAGNOSIS — M150 Primary generalized (osteo)arthritis: Secondary | ICD-10-CM

## 2024-02-05 DIAGNOSIS — Z510 Encounter for antineoplastic radiation therapy: Secondary | ICD-10-CM | POA: Diagnosis present

## 2024-02-06 ENCOUNTER — Ambulatory Visit

## 2024-02-07 NOTE — Progress Notes (Signed)
°  Radiation Oncology         [phone] ________________________________  Name: KLOI BRODMAN MRN: [id number]  Date: 02/05/2024  DOB: Sep 19, 1945  SIMULATION AND TREATMENT PLANNING NOTE    ICD-10-CM   1. Primary osteoarthritis involving multiple joints  M15.0       DIAGNOSIS:  78 y.o. woman with the following with osteoarthritis of the right hip and right foot  NARRATIVE:  The patient was brought to the CT Simulation planning suite.  Identity was confirmed.  All relevant records and images related to the planned course of therapy were reviewed.  The patient freely provided informed written consent to proceed with treatment after reviewing the details related to the planned course of therapy. The consent form was witnessed and verified by the simulation staff.  Then, the patient was set-up in a stable reproducible  supine position for radiation therapy.  CT images were obtained.  Surface markings were placed.  The CT images were loaded into the planning software.  Then the target and avoidance structures were contoured.  Treatment planning then occurred.  The radiation prescription was entered and confirmed.  Then, I designed and supervised the construction of multiple medically necessary complex treatment devices.  I have requested : Isodose Plan.  PLAN:  The patient will receive 3 Gy in 6 fractions to the right foot and right hip.  ________________________________  Donnice LABOR. Patrcia, M.D.

## 2024-02-09 ENCOUNTER — Ambulatory Visit

## 2024-02-10 DIAGNOSIS — Z510 Encounter for antineoplastic radiation therapy: Secondary | ICD-10-CM | POA: Diagnosis not present

## 2024-02-11 ENCOUNTER — Ambulatory Visit

## 2024-02-13 ENCOUNTER — Ambulatory Visit

## 2024-02-16 ENCOUNTER — Other Ambulatory Visit: Payer: Self-pay

## 2024-02-16 ENCOUNTER — Ambulatory Visit: Admission: RE | Admit: 2024-02-16 | Discharge: 2024-02-16 | Attending: Radiation Oncology

## 2024-02-16 DIAGNOSIS — Z510 Encounter for antineoplastic radiation therapy: Secondary | ICD-10-CM | POA: Diagnosis not present

## 2024-02-16 LAB — RAD ONC ARIA SESSION SUMMARY
Course Elapsed Days: 0
Plan Fractions Treated to Date: 1
Plan Fractions Treated to Date: 1
Plan Prescribed Dose Per Fraction: 0.5 Gy
Plan Prescribed Dose Per Fraction: 0.5 Gy
Plan Total Fractions Prescribed: 6
Plan Total Fractions Prescribed: 6
Plan Total Prescribed Dose: 3 Gy
Plan Total Prescribed Dose: 3 Gy
Reference Point Dosage Given to Date: 0.5 Gy
Reference Point Dosage Given to Date: 0.5 Gy
Reference Point Session Dosage Given: 0.5 Gy
Reference Point Session Dosage Given: 0.5 Gy
Session Number: 1

## 2024-02-18 ENCOUNTER — Other Ambulatory Visit: Payer: Self-pay

## 2024-02-18 ENCOUNTER — Ambulatory Visit
Admission: RE | Admit: 2024-02-18 | Discharge: 2024-02-18 | Disposition: A | Source: Ambulatory Visit | Attending: Radiation Oncology

## 2024-02-18 DIAGNOSIS — Z510 Encounter for antineoplastic radiation therapy: Secondary | ICD-10-CM | POA: Diagnosis not present

## 2024-02-18 LAB — RAD ONC ARIA SESSION SUMMARY
Course Elapsed Days: 2
Plan Fractions Treated to Date: 2
Plan Fractions Treated to Date: 2
Plan Prescribed Dose Per Fraction: 0.5 Gy
Plan Prescribed Dose Per Fraction: 0.5 Gy
Plan Total Fractions Prescribed: 6
Plan Total Fractions Prescribed: 6
Plan Total Prescribed Dose: 3 Gy
Plan Total Prescribed Dose: 3 Gy
Reference Point Dosage Given to Date: 1 Gy
Reference Point Dosage Given to Date: 1 Gy
Reference Point Session Dosage Given: 0.5 Gy
Reference Point Session Dosage Given: 0.5 Gy
Session Number: 2

## 2024-02-23 ENCOUNTER — Other Ambulatory Visit: Payer: Self-pay

## 2024-02-23 ENCOUNTER — Ambulatory Visit
Admission: RE | Admit: 2024-02-23 | Discharge: 2024-02-23 | Disposition: A | Discharge status: Home / Self Care | Source: Ambulatory Visit | Attending: Radiation Oncology | Admitting: Radiation Oncology

## 2024-02-23 DIAGNOSIS — Z510 Encounter for antineoplastic radiation therapy: Secondary | ICD-10-CM | POA: Diagnosis not present

## 2024-02-23 LAB — RAD ONC ARIA SESSION SUMMARY
Course Elapsed Days: 7
Plan Fractions Treated to Date: 3
Plan Fractions Treated to Date: 3
Plan Prescribed Dose Per Fraction: 0.5 Gy
Plan Prescribed Dose Per Fraction: 0.5 Gy
Plan Total Fractions Prescribed: 6
Plan Total Fractions Prescribed: 6
Plan Total Prescribed Dose: 3 Gy
Plan Total Prescribed Dose: 3 Gy
Reference Point Dosage Given to Date: 1.5 Gy
Reference Point Dosage Given to Date: 1.5 Gy
Reference Point Session Dosage Given: 0.5 Gy
Reference Point Session Dosage Given: 0.5 Gy
Session Number: 3

## 2024-02-24 ENCOUNTER — Ambulatory Visit

## 2024-02-25 ENCOUNTER — Ambulatory Visit
Admission: RE | Admit: 2024-02-25 | Discharge: 2024-02-25 | Disposition: A | Discharge status: Home / Self Care | Source: Ambulatory Visit | Attending: Radiation Oncology | Admitting: Radiation Oncology

## 2024-02-25 ENCOUNTER — Other Ambulatory Visit: Payer: Self-pay

## 2024-02-25 DIAGNOSIS — Z510 Encounter for antineoplastic radiation therapy: Secondary | ICD-10-CM | POA: Diagnosis not present

## 2024-02-25 LAB — RAD ONC ARIA SESSION SUMMARY
Course Elapsed Days: 9
Plan Fractions Treated to Date: 4
Plan Fractions Treated to Date: 4
Plan Prescribed Dose Per Fraction: 0.5 Gy
Plan Prescribed Dose Per Fraction: 0.5 Gy
Plan Total Fractions Prescribed: 6
Plan Total Fractions Prescribed: 6
Plan Total Prescribed Dose: 3 Gy
Plan Total Prescribed Dose: 3 Gy
Reference Point Dosage Given to Date: 2 Gy
Reference Point Dosage Given to Date: 2 Gy
Reference Point Session Dosage Given: 0.5 Gy
Reference Point Session Dosage Given: 0.5 Gy
Session Number: 4

## 2024-03-01 ENCOUNTER — Ambulatory Visit
Admission: RE | Admit: 2024-03-01 | Discharge: 2024-03-01 | Disposition: A | Discharge status: Home / Self Care | Source: Ambulatory Visit | Attending: Radiation Oncology | Admitting: Radiation Oncology

## 2024-03-01 ENCOUNTER — Other Ambulatory Visit: Payer: Self-pay

## 2024-03-01 ENCOUNTER — Ambulatory Visit: Admission: RE | Admit: 2024-03-01 | Discharge: 2024-03-01 | Attending: Radiation Oncology

## 2024-03-01 DIAGNOSIS — M1611 Unilateral primary osteoarthritis, right hip: Secondary | ICD-10-CM | POA: Insufficient documentation

## 2024-03-01 DIAGNOSIS — Z510 Encounter for antineoplastic radiation therapy: Secondary | ICD-10-CM | POA: Diagnosis present

## 2024-03-01 DIAGNOSIS — M19071 Primary osteoarthritis, right ankle and foot: Secondary | ICD-10-CM | POA: Diagnosis not present

## 2024-03-01 LAB — RAD ONC ARIA SESSION SUMMARY
Course Elapsed Days: 14
Plan Fractions Treated to Date: 5
Plan Fractions Treated to Date: 5
Plan Prescribed Dose Per Fraction: 0.5 Gy
Plan Prescribed Dose Per Fraction: 0.5 Gy
Plan Total Fractions Prescribed: 6
Plan Total Fractions Prescribed: 6
Plan Total Prescribed Dose: 3 Gy
Plan Total Prescribed Dose: 3 Gy
Reference Point Dosage Given to Date: 2.5 Gy
Reference Point Dosage Given to Date: 2.5 Gy
Reference Point Session Dosage Given: 0.5 Gy
Reference Point Session Dosage Given: 0.5 Gy
Session Number: 5

## 2024-03-03 ENCOUNTER — Other Ambulatory Visit: Payer: Self-pay

## 2024-03-03 ENCOUNTER — Ambulatory Visit
Admission: RE | Admit: 2024-03-03 | Discharge: 2024-03-03 | Disposition: A | Discharge status: Home / Self Care | Source: Ambulatory Visit | Attending: Radiation Oncology | Admitting: Radiation Oncology

## 2024-03-03 DIAGNOSIS — Z510 Encounter for antineoplastic radiation therapy: Secondary | ICD-10-CM | POA: Diagnosis not present

## 2024-03-03 LAB — RAD ONC ARIA SESSION SUMMARY
Course Elapsed Days: 16
Plan Fractions Treated to Date: 6
Plan Fractions Treated to Date: 6
Plan Prescribed Dose Per Fraction: 0.5 Gy
Plan Prescribed Dose Per Fraction: 0.5 Gy
Plan Total Fractions Prescribed: 6
Plan Total Fractions Prescribed: 6
Plan Total Prescribed Dose: 3 Gy
Plan Total Prescribed Dose: 3 Gy
Reference Point Dosage Given to Date: 3 Gy
Reference Point Dosage Given to Date: 3 Gy
Reference Point Session Dosage Given: 0.5 Gy
Reference Point Session Dosage Given: 0.5 Gy
Session Number: 6

## 2024-03-04 NOTE — Radiation Completion Notes (Signed)
 Patient Name: Mia Peterson, Mia Peterson MRN: [id number] Date of Birth: Mar 24, 1945 Referring Physician: WOLM SCARLET, M.D. Date of Service: 2024-03-04 Radiation Oncologist: Adina Barge, M.D. 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 Intent: Palliative     ==========DELIVERED PLANS==========  First Treatment Date: 2024-02-16 Last Treatment Date: 2024-03-03   Plan Name: Ext_R_Foot Site: Leg, Right Technique: Isodose Plan Mode: Photon Dose Per Fraction: 0.5 Gy Prescribed Dose (Delivered / Prescribed): 3 Gy / 3 Gy Prescribed Fxs (Delivered / Prescribed): 6 / 6   Plan Name: Ext_R_Hip Site: Femur Right Technique: Isodose Plan Mode: Photon Dose Per Fraction: 0.5 Gy Prescribed Dose (Delivered / Prescribed): 3 Gy / 3 Gy Prescribed Fxs (Delivered / Prescribed): 6 / 6     ==========ON TREATMENT VISIT DATES========== 2024-03-01     ==========UPCOMING VISITS========== 04/06/2024 [facility] POST TREATMENT CALL [facility]  03/25/2024 [facility] VISIT 30 Onita Duos, MD        ==========APPENDIX - ON TREATMENT VISIT NOTES==========   See weekly On Treatment Notes in Epic for details in the Media tab (listed as Progress notes on the On Treatment Visit Dates listed above).

## 2024-03-10 ENCOUNTER — Ambulatory Visit: Payer: Medicare Other | Admitting: Neurology

## 2024-03-25 ENCOUNTER — Ambulatory Visit: Admitting: Neurology

## 2024-03-25 ENCOUNTER — Encounter: Payer: Self-pay | Admitting: Neurology

## 2024-03-25 VITALS — BP 132/84 | HR 87 | Ht 68.0 in | Wt 157.2 lb

## 2024-03-25 DIAGNOSIS — R2689 Other abnormalities of gait and mobility: Secondary | ICD-10-CM | POA: Diagnosis not present

## 2024-03-26 NOTE — Progress Notes (Incomplete)
 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

## 2024-04-06 ENCOUNTER — Ambulatory Visit
